# Patient Record
Sex: Male | Born: 1935 | Race: White | Hispanic: No | Marital: Married | Smoking: Former smoker
Health system: Southern US, Community
[De-identification: ages and names within clinical notes are randomized; demographics above are authoritative.]

## PROBLEM LIST (undated history)

## (undated) DIAGNOSIS — I509 Heart failure, unspecified: Secondary | ICD-10-CM

## (undated) DIAGNOSIS — I219 Acute myocardial infarction, unspecified: Secondary | ICD-10-CM

## (undated) DIAGNOSIS — Z9989 Dependence on other enabling machines and devices: Secondary | ICD-10-CM

## (undated) DIAGNOSIS — N183 Chronic kidney disease, stage 3 unspecified: Secondary | ICD-10-CM

## (undated) DIAGNOSIS — G4733 Obstructive sleep apnea (adult) (pediatric): Secondary | ICD-10-CM

## (undated) DIAGNOSIS — Z9289 Personal history of other medical treatment: Secondary | ICD-10-CM

## (undated) DIAGNOSIS — E119 Type 2 diabetes mellitus without complications: Secondary | ICD-10-CM

## (undated) DIAGNOSIS — Z95810 Presence of automatic (implantable) cardiac defibrillator: Secondary | ICD-10-CM

## (undated) DIAGNOSIS — D649 Anemia, unspecified: Secondary | ICD-10-CM

## (undated) DIAGNOSIS — J189 Pneumonia, unspecified organism: Secondary | ICD-10-CM

## (undated) DIAGNOSIS — C189 Malignant neoplasm of colon, unspecified: Secondary | ICD-10-CM

## (undated) DIAGNOSIS — I4891 Unspecified atrial fibrillation: Secondary | ICD-10-CM

## (undated) DIAGNOSIS — E7800 Pure hypercholesterolemia, unspecified: Secondary | ICD-10-CM

## (undated) DIAGNOSIS — Z8739 Personal history of other diseases of the musculoskeletal system and connective tissue: Secondary | ICD-10-CM

## (undated) HISTORY — PX: CATARACT EXTRACTION W/ INTRAOCULAR LENS  IMPLANT, BILATERAL: SHX1307

## (undated) HISTORY — PX: NASAL SINUS SURGERY: SHX719

## (undated) HISTORY — PX: INSERT / REPLACE / REMOVE PACEMAKER: SUR710

## (undated) HISTORY — PX: CARDIAC CATHETERIZATION: SHX172

## (undated) HISTORY — PX: APPENDECTOMY: SHX54

## (undated) HISTORY — PX: ATRIAL FIBRILLATION ABLATION: EP1191

## (undated) HISTORY — DX: Heart failure, unspecified: I50.9

## (undated) HISTORY — DX: Anemia, unspecified: D64.9

---

## 1984-05-30 DIAGNOSIS — I219 Acute myocardial infarction, unspecified: Secondary | ICD-10-CM

## 1984-05-30 HISTORY — DX: Acute myocardial infarction, unspecified: I21.9

## 1984-05-30 HISTORY — PX: CORONARY ARTERY BYPASS GRAFT: SHX141

## 2004-05-30 DIAGNOSIS — Z95810 Presence of automatic (implantable) cardiac defibrillator: Secondary | ICD-10-CM

## 2004-05-30 HISTORY — DX: Presence of automatic (implantable) cardiac defibrillator: Z95.810

## 2004-05-30 HISTORY — PX: CARDIAC DEFIBRILLATOR PLACEMENT: SHX171

## 2011-03-31 DIAGNOSIS — I4819 Other persistent atrial fibrillation: Secondary | ICD-10-CM | POA: Diagnosis present

## 2011-07-13 DIAGNOSIS — M216X9 Other acquired deformities of unspecified foot: Secondary | ICD-10-CM | POA: Insufficient documentation

## 2011-07-13 DIAGNOSIS — L98499 Non-pressure chronic ulcer of skin of other sites with unspecified severity: Secondary | ICD-10-CM | POA: Insufficient documentation

## 2011-08-09 DIAGNOSIS — M2040 Other hammer toe(s) (acquired), unspecified foot: Secondary | ICD-10-CM | POA: Insufficient documentation

## 2011-08-09 DIAGNOSIS — M21969 Unspecified acquired deformity of unspecified lower leg: Secondary | ICD-10-CM | POA: Insufficient documentation

## 2011-10-20 DIAGNOSIS — B351 Tinea unguium: Secondary | ICD-10-CM | POA: Insufficient documentation

## 2011-11-08 DIAGNOSIS — L988 Other specified disorders of the skin and subcutaneous tissue: Secondary | ICD-10-CM | POA: Insufficient documentation

## 2011-11-21 DIAGNOSIS — I509 Heart failure, unspecified: Secondary | ICD-10-CM | POA: Insufficient documentation

## 2011-11-21 DIAGNOSIS — I2510 Atherosclerotic heart disease of native coronary artery without angina pectoris: Secondary | ICD-10-CM | POA: Insufficient documentation

## 2011-11-28 DIAGNOSIS — E119 Type 2 diabetes mellitus without complications: Secondary | ICD-10-CM | POA: Insufficient documentation

## 2011-11-28 DIAGNOSIS — R634 Abnormal weight loss: Secondary | ICD-10-CM | POA: Insufficient documentation

## 2011-11-28 DIAGNOSIS — M79609 Pain in unspecified limb: Secondary | ICD-10-CM | POA: Insufficient documentation

## 2011-11-28 DIAGNOSIS — M79673 Pain in unspecified foot: Secondary | ICD-10-CM | POA: Insufficient documentation

## 2012-06-18 DIAGNOSIS — R609 Edema, unspecified: Secondary | ICD-10-CM | POA: Insufficient documentation

## 2012-09-17 DIAGNOSIS — E559 Vitamin D deficiency, unspecified: Secondary | ICD-10-CM | POA: Insufficient documentation

## 2012-12-03 DIAGNOSIS — G4730 Sleep apnea, unspecified: Secondary | ICD-10-CM | POA: Insufficient documentation

## 2012-12-03 DIAGNOSIS — R351 Nocturia: Secondary | ICD-10-CM | POA: Insufficient documentation

## 2012-12-03 DIAGNOSIS — I429 Cardiomyopathy, unspecified: Secondary | ICD-10-CM | POA: Insufficient documentation

## 2012-12-03 DIAGNOSIS — I428 Other cardiomyopathies: Secondary | ICD-10-CM | POA: Insufficient documentation

## 2012-12-03 DIAGNOSIS — G4733 Obstructive sleep apnea (adult) (pediatric): Secondary | ICD-10-CM | POA: Insufficient documentation

## 2012-12-03 DIAGNOSIS — I423 Endomyocardial (eosinophilic) disease: Secondary | ICD-10-CM | POA: Insufficient documentation

## 2013-05-30 DIAGNOSIS — J189 Pneumonia, unspecified organism: Secondary | ICD-10-CM

## 2013-05-30 HISTORY — DX: Pneumonia, unspecified organism: J18.9

## 2013-07-10 ENCOUNTER — Ambulatory Visit: Payer: Self-pay | Admitting: Oncology

## 2013-07-27 ENCOUNTER — Inpatient Hospital Stay: Payer: Self-pay | Admitting: Internal Medicine

## 2013-07-27 LAB — CBC
HCT: 34.8 % — ABNORMAL LOW (ref 40.0–52.0)
HGB: 11.7 g/dL — AB (ref 13.0–18.0)
MCH: 30.3 pg (ref 26.0–34.0)
MCHC: 33.6 g/dL (ref 32.0–36.0)
MCV: 90 fL (ref 80–100)
Platelet: 133 10*3/uL — ABNORMAL LOW (ref 150–440)
RBC: 3.85 10*6/uL — ABNORMAL LOW (ref 4.40–5.90)
RDW: 14.5 % (ref 11.5–14.5)
WBC: 9 10*3/uL (ref 3.8–10.6)

## 2013-07-27 LAB — COMPREHENSIVE METABOLIC PANEL
ANION GAP: 8 (ref 7–16)
AST: 23 U/L (ref 15–37)
Albumin: 3.1 g/dL — ABNORMAL LOW (ref 3.4–5.0)
Alkaline Phosphatase: 63 U/L
BUN: 41 mg/dL — ABNORMAL HIGH (ref 7–18)
Bilirubin,Total: 1.1 mg/dL — ABNORMAL HIGH (ref 0.2–1.0)
CHLORIDE: 95 mmol/L — AB (ref 98–107)
Calcium, Total: 8.6 mg/dL (ref 8.5–10.1)
Co2: 25 mmol/L (ref 21–32)
Creatinine: 1.88 mg/dL — ABNORMAL HIGH (ref 0.60–1.30)
EGFR (Non-African Amer.): 33 — ABNORMAL LOW
GFR CALC AF AMER: 39 — AB
Glucose: 105 mg/dL — ABNORMAL HIGH (ref 65–99)
Osmolality: 268 (ref 275–301)
Potassium: 4 mmol/L (ref 3.5–5.1)
SGPT (ALT): 15 U/L (ref 12–78)
SODIUM: 128 mmol/L — AB (ref 136–145)
Total Protein: 7.3 g/dL (ref 6.4–8.2)

## 2013-07-27 LAB — PROTIME-INR
INR: 2.1
PROTHROMBIN TIME: 23 s — AB (ref 11.5–14.7)

## 2013-07-27 LAB — TROPONIN I: Troponin-I: <0.02 ng/mL

## 2013-07-28 LAB — CBC WITH DIFFERENTIAL/PLATELET
BASOS ABS: 0 10*3/uL (ref 0.0–0.1)
Basophil %: 0.1 %
EOS ABS: 0.1 10*3/uL (ref 0.0–0.7)
Eosinophil %: 0.7 %
HCT: 32.2 % — AB (ref 40.0–52.0)
HGB: 11.1 g/dL — AB (ref 13.0–18.0)
LYMPHS PCT: 9.2 %
Lymphocyte #: 0.9 10*3/uL — ABNORMAL LOW (ref 1.0–3.6)
MCH: 30.9 pg (ref 26.0–34.0)
MCHC: 34.6 g/dL (ref 32.0–36.0)
MCV: 89 fL (ref 80–100)
Monocyte #: 1.4 x10 3/mm — ABNORMAL HIGH (ref 0.2–1.0)
Monocyte %: 15.2 %
Neutrophil #: 7.1 10*3/uL — ABNORMAL HIGH (ref 1.4–6.5)
Neutrophil %: 74.8 %
Platelet: 136 10*3/uL — ABNORMAL LOW (ref 150–440)
RBC: 3.6 10*6/uL — AB (ref 4.40–5.90)
RDW: 14.7 % — AB (ref 11.5–14.5)
WBC: 9.5 10*3/uL (ref 3.8–10.6)

## 2013-07-28 LAB — LIPID PANEL
CHOLESTEROL: 72 mg/dL (ref 0–200)
HDL Cholesterol: 28 mg/dL — ABNORMAL LOW (ref 40–60)
LDL CHOLESTEROL, CALC: 25 mg/dL (ref 0–100)
TRIGLYCERIDES: 93 mg/dL (ref 0–200)
VLDL Cholesterol, Calc: 19 mg/dL (ref 5–40)

## 2013-07-28 LAB — BASIC METABOLIC PANEL
ANION GAP: 6 — AB (ref 7–16)
BUN: 43 mg/dL — AB (ref 7–18)
CO2: 26 mmol/L (ref 21–32)
Calcium, Total: 8.4 mg/dL — ABNORMAL LOW (ref 8.5–10.1)
Chloride: 92 mmol/L — ABNORMAL LOW (ref 98–107)
Creatinine: 1.9 mg/dL — ABNORMAL HIGH (ref 0.60–1.30)
EGFR (African American): 38 — ABNORMAL LOW
GFR CALC NON AF AMER: 33 — AB
GLUCOSE: 46 mg/dL — AB (ref 65–99)
Osmolality: 258 (ref 275–301)
Potassium: 4 mmol/L (ref 3.5–5.1)
SODIUM: 124 mmol/L — AB (ref 136–145)

## 2013-07-28 LAB — MAGNESIUM: Magnesium: 1.8 mg/dL

## 2013-07-28 LAB — PROTIME-INR
INR: 2.7
PROTHROMBIN TIME: 28.3 s — AB (ref 11.5–14.7)

## 2013-07-28 LAB — HEMOGLOBIN A1C: HEMOGLOBIN A1C: 6.7 % — AB (ref 4.2–6.3)

## 2013-07-29 LAB — BASIC METABOLIC PANEL
ANION GAP: 7 (ref 7–16)
BUN: 42 mg/dL — ABNORMAL HIGH (ref 7–18)
Calcium, Total: 8.3 mg/dL — ABNORMAL LOW (ref 8.5–10.1)
Chloride: 86 mmol/L — ABNORMAL LOW (ref 98–107)
Co2: 26 mmol/L (ref 21–32)
Creatinine: 1.76 mg/dL — ABNORMAL HIGH (ref 0.60–1.30)
EGFR (African American): 42 — ABNORMAL LOW
EGFR (Non-African Amer.): 36 — ABNORMAL LOW
GLUCOSE: 123 mg/dL — AB (ref 65–99)
Osmolality: 252 (ref 275–301)
Potassium: 4 mmol/L (ref 3.5–5.1)
Sodium: 119 mmol/L — CL (ref 136–145)

## 2013-07-29 LAB — CBC WITH DIFFERENTIAL/PLATELET
BASOS ABS: 0 10*3/uL (ref 0.0–0.1)
BASOS PCT: 0.2 %
Eosinophil #: 0.3 10*3/uL (ref 0.0–0.7)
Eosinophil %: 3.3 %
HCT: 30 % — ABNORMAL LOW (ref 40.0–52.0)
HGB: 10.5 g/dL — ABNORMAL LOW (ref 13.0–18.0)
Lymphocyte #: 0.8 10*3/uL — ABNORMAL LOW (ref 1.0–3.6)
Lymphocyte %: 9.7 %
MCH: 31.2 pg (ref 26.0–34.0)
MCHC: 35.1 g/dL (ref 32.0–36.0)
MCV: 89 fL (ref 80–100)
MONO ABS: 1 x10 3/mm (ref 0.2–1.0)
Monocyte %: 11.9 %
NEUTROS ABS: 6 10*3/uL (ref 1.4–6.5)
Neutrophil %: 74.9 %
Platelet: 146 10*3/uL — ABNORMAL LOW (ref 150–440)
RBC: 3.38 10*6/uL — ABNORMAL LOW (ref 4.40–5.90)
RDW: 14.7 % — AB (ref 11.5–14.5)
WBC: 8 10*3/uL (ref 3.8–10.6)

## 2013-07-29 LAB — PROTIME-INR
INR: 3.5
PROTHROMBIN TIME: 33.7 s — AB (ref 11.5–14.7)

## 2013-07-30 LAB — BASIC METABOLIC PANEL
Anion Gap: 8 (ref 7–16)
BUN: 39 mg/dL — ABNORMAL HIGH (ref 7–18)
Calcium, Total: 8.2 mg/dL — ABNORMAL LOW (ref 8.5–10.1)
Chloride: 88 mmol/L — ABNORMAL LOW (ref 98–107)
Co2: 25 mmol/L (ref 21–32)
Creatinine: 1.66 mg/dL — ABNORMAL HIGH (ref 0.60–1.30)
EGFR (African American): 45 — ABNORMAL LOW
GFR CALC NON AF AMER: 39 — AB
GLUCOSE: 121 mg/dL — AB (ref 65–99)
OSMOLALITY: 255 (ref 275–301)
Potassium: 4.3 mmol/L (ref 3.5–5.1)
Sodium: 121 mmol/L — ABNORMAL LOW (ref 136–145)

## 2013-07-30 LAB — CBC WITH DIFFERENTIAL/PLATELET
BASOS PCT: 0.2 %
Basophil #: 0 10*3/uL (ref 0.0–0.1)
EOS ABS: 0.2 10*3/uL (ref 0.0–0.7)
Eosinophil %: 3.1 %
HCT: 29.8 % — ABNORMAL LOW (ref 40.0–52.0)
HGB: 10.5 g/dL — ABNORMAL LOW (ref 13.0–18.0)
LYMPHS PCT: 12.3 %
Lymphocyte #: 0.8 10*3/uL — ABNORMAL LOW (ref 1.0–3.6)
MCH: 31.3 pg (ref 26.0–34.0)
MCHC: 35.1 g/dL (ref 32.0–36.0)
MCV: 89 fL (ref 80–100)
MONOS PCT: 14.3 %
Monocyte #: 0.9 x10 3/mm (ref 0.2–1.0)
NEUTROS PCT: 70.1 %
Neutrophil #: 4.5 10*3/uL (ref 1.4–6.5)
PLATELETS: 137 10*3/uL — AB (ref 150–440)
RBC: 3.35 10*6/uL — ABNORMAL LOW (ref 4.40–5.90)
RDW: 14.9 % — ABNORMAL HIGH (ref 11.5–14.5)
WBC: 6.4 10*3/uL (ref 3.8–10.6)

## 2013-07-30 LAB — PROTIME-INR
INR: 2.8
Prothrombin Time: 28.7 secs — ABNORMAL HIGH (ref 11.5–14.7)

## 2013-07-31 ENCOUNTER — Ambulatory Visit: Payer: Self-pay | Admitting: Neurology

## 2013-07-31 LAB — CBC WITH DIFFERENTIAL/PLATELET
BASOS ABS: 0 10*3/uL (ref 0.0–0.1)
Basophil %: 0.2 %
EOS ABS: 0.2 10*3/uL (ref 0.0–0.7)
Eosinophil %: 2.8 %
HCT: 29.4 % — ABNORMAL LOW (ref 40.0–52.0)
HGB: 10.4 g/dL — AB (ref 13.0–18.0)
LYMPHS PCT: 14.8 %
Lymphocyte #: 0.9 10*3/uL — ABNORMAL LOW (ref 1.0–3.6)
MCH: 31.7 pg (ref 26.0–34.0)
MCHC: 35.4 g/dL (ref 32.0–36.0)
MCV: 89 fL (ref 80–100)
MONOS PCT: 15.3 %
Monocyte #: 0.9 x10 3/mm (ref 0.2–1.0)
Neutrophil #: 4.1 10*3/uL (ref 1.4–6.5)
Neutrophil %: 66.9 %
PLATELETS: 149 10*3/uL — AB (ref 150–440)
RBC: 3.28 10*6/uL — AB (ref 4.40–5.90)
RDW: 14.3 % (ref 11.5–14.5)
WBC: 6.1 10*3/uL (ref 3.8–10.6)

## 2013-07-31 LAB — COMPREHENSIVE METABOLIC PANEL
ALK PHOS: 86 U/L
Albumin: 2.6 g/dL — ABNORMAL LOW (ref 3.4–5.0)
Anion Gap: 7 (ref 7–16)
BILIRUBIN TOTAL: 1.2 mg/dL — AB (ref 0.2–1.0)
BUN: 37 mg/dL — AB (ref 7–18)
CALCIUM: 8.5 mg/dL (ref 8.5–10.1)
CHLORIDE: 92 mmol/L — AB (ref 98–107)
CO2: 24 mmol/L (ref 21–32)
Creatinine: 1.55 mg/dL — ABNORMAL HIGH (ref 0.60–1.30)
EGFR (African American): 49 — ABNORMAL LOW
EGFR (Non-African Amer.): 42 — ABNORMAL LOW
Glucose: 106 mg/dL — ABNORMAL HIGH (ref 65–99)
Osmolality: 257 (ref 275–301)
Potassium: 4.1 mmol/L (ref 3.5–5.1)
SGOT(AST): 20 U/L (ref 15–37)
SGPT (ALT): 27 U/L (ref 12–78)
Sodium: 123 mmol/L — ABNORMAL LOW (ref 136–145)
Total Protein: 6.6 g/dL (ref 6.4–8.2)

## 2013-07-31 LAB — PROTIME-INR
INR: 2.2
PROTHROMBIN TIME: 23.7 s — AB (ref 11.5–14.7)

## 2013-08-01 LAB — BASIC METABOLIC PANEL
ANION GAP: 7 (ref 7–16)
BUN: 28 mg/dL — ABNORMAL HIGH (ref 7–18)
Calcium, Total: 8.7 mg/dL (ref 8.5–10.1)
Chloride: 96 mmol/L — ABNORMAL LOW (ref 98–107)
Co2: 24 mmol/L (ref 21–32)
Creatinine: 1.28 mg/dL (ref 0.60–1.30)
GFR CALC NON AF AMER: 53 — AB
Glucose: 125 mg/dL — ABNORMAL HIGH (ref 65–99)
Osmolality: 262 (ref 275–301)
POTASSIUM: 3.9 mmol/L (ref 3.5–5.1)
Sodium: 127 mmol/L — ABNORMAL LOW (ref 136–145)

## 2013-08-01 LAB — PROTIME-INR
INR: 2.1
PROTHROMBIN TIME: 23.3 s — AB (ref 11.5–14.7)

## 2013-08-01 LAB — CBC WITH DIFFERENTIAL/PLATELET
BASOS PCT: 0.3 %
Basophil #: 0 10*3/uL (ref 0.0–0.1)
Eosinophil #: 0.2 10*3/uL (ref 0.0–0.7)
Eosinophil %: 2.6 %
HCT: 29.2 % — AB (ref 40.0–52.0)
HGB: 10.3 g/dL — ABNORMAL LOW (ref 13.0–18.0)
LYMPHS ABS: 0.7 10*3/uL — AB (ref 1.0–3.6)
LYMPHS PCT: 10.8 %
MCH: 31.8 pg (ref 26.0–34.0)
MCHC: 35.3 g/dL (ref 32.0–36.0)
MCV: 90 fL (ref 80–100)
Monocyte #: 1.1 x10 3/mm — ABNORMAL HIGH (ref 0.2–1.0)
Monocyte %: 16.8 %
NEUTROS PCT: 69.5 %
Neutrophil #: 4.5 10*3/uL (ref 1.4–6.5)
Platelet: 146 10*3/uL — ABNORMAL LOW (ref 150–440)
RBC: 3.25 10*6/uL — AB (ref 4.40–5.90)
RDW: 14.7 % — ABNORMAL HIGH (ref 11.5–14.5)
WBC: 6.5 10*3/uL (ref 3.8–10.6)

## 2013-08-01 LAB — EXPECTORATED SPUTUM ASSESSMENT W REFEX TO RESP CULTURE

## 2013-08-01 LAB — CULTURE, BLOOD (SINGLE)

## 2013-08-02 LAB — BASIC METABOLIC PANEL
Anion Gap: 5 — ABNORMAL LOW (ref 7–16)
BUN: 22 mg/dL — ABNORMAL HIGH (ref 7–18)
CREATININE: 1.22 mg/dL (ref 0.60–1.30)
Calcium, Total: 8.5 mg/dL (ref 8.5–10.1)
Chloride: 103 mmol/L (ref 98–107)
Co2: 26 mmol/L (ref 21–32)
EGFR (Non-African Amer.): 56 — ABNORMAL LOW
Glucose: 131 mg/dL — ABNORMAL HIGH (ref 65–99)
Osmolality: 273 (ref 275–301)
Potassium: 4 mmol/L (ref 3.5–5.1)
Sodium: 134 mmol/L — ABNORMAL LOW (ref 136–145)

## 2013-08-02 LAB — PROTIME-INR
INR: 2.2
Prothrombin Time: 23.6 secs — ABNORMAL HIGH (ref 11.5–14.7)

## 2013-08-02 LAB — CBC WITH DIFFERENTIAL/PLATELET
BASOS ABS: 0 10*3/uL (ref 0.0–0.1)
Basophil %: 0.6 %
EOS PCT: 3.6 %
Eosinophil #: 0.2 10*3/uL (ref 0.0–0.7)
HCT: 28.3 % — AB (ref 40.0–52.0)
HGB: 9.7 g/dL — ABNORMAL LOW (ref 13.0–18.0)
LYMPHS ABS: 0.7 10*3/uL — AB (ref 1.0–3.6)
Lymphocyte %: 12.8 %
MCH: 31.1 pg (ref 26.0–34.0)
MCHC: 34.3 g/dL (ref 32.0–36.0)
MCV: 91 fL (ref 80–100)
MONOS PCT: 15.3 %
Monocyte #: 0.8 x10 3/mm (ref 0.2–1.0)
NEUTROS ABS: 3.7 10*3/uL (ref 1.4–6.5)
NEUTROS PCT: 67.7 %
PLATELETS: 160 10*3/uL (ref 150–440)
RBC: 3.13 10*6/uL — ABNORMAL LOW (ref 4.40–5.90)
RDW: 14.8 % — ABNORMAL HIGH (ref 11.5–14.5)
WBC: 5.4 10*3/uL (ref 3.8–10.6)

## 2013-08-03 LAB — CBC WITH DIFFERENTIAL/PLATELET
BASOS ABS: 0 10*3/uL (ref 0.0–0.1)
BASOS PCT: 0.4 %
EOS ABS: 0.3 10*3/uL (ref 0.0–0.7)
EOS PCT: 3.4 %
HCT: 31.7 % — ABNORMAL LOW (ref 40.0–52.0)
HGB: 10.9 g/dL — ABNORMAL LOW (ref 13.0–18.0)
Lymphocyte #: 1.1 10*3/uL (ref 1.0–3.6)
Lymphocyte %: 14.3 %
MCH: 31.2 pg (ref 26.0–34.0)
MCHC: 34.3 g/dL (ref 32.0–36.0)
MCV: 91 fL (ref 80–100)
Monocyte #: 1.1 x10 3/mm — ABNORMAL HIGH (ref 0.2–1.0)
Monocyte %: 13.5 %
NEUTROS ABS: 5.4 10*3/uL (ref 1.4–6.5)
NEUTROS PCT: 68.4 %
Platelet: 241 10*3/uL (ref 150–440)
RBC: 3.48 10*6/uL — ABNORMAL LOW (ref 4.40–5.90)
RDW: 14.9 % — ABNORMAL HIGH (ref 11.5–14.5)
WBC: 7.8 10*3/uL (ref 3.8–10.6)

## 2013-08-03 LAB — BASIC METABOLIC PANEL
Anion Gap: 5 — ABNORMAL LOW (ref 7–16)
BUN: 16 mg/dL (ref 7–18)
CALCIUM: 9.2 mg/dL (ref 8.5–10.1)
CO2: 27 mmol/L (ref 21–32)
Chloride: 101 mmol/L (ref 98–107)
Creatinine: 1.13 mg/dL (ref 0.60–1.30)
EGFR (African American): >60
Glucose: 151 mg/dL — ABNORMAL HIGH (ref 65–99)
Osmolality: 270 (ref 275–301)
Potassium: 4.7 mmol/L (ref 3.5–5.1)
SODIUM: 133 mmol/L — AB (ref 136–145)

## 2013-08-03 LAB — PROTIME-INR
INR: 2.1
Prothrombin Time: 23.4 secs — ABNORMAL HIGH (ref 11.5–14.7)

## 2013-08-04 ENCOUNTER — Emergency Department: Payer: Self-pay | Admitting: Emergency Medicine

## 2013-08-04 LAB — URINALYSIS, COMPLETE
BACTERIA: NONE SEEN
Bilirubin,UR: NEGATIVE
KETONE: NEGATIVE
Leukocyte Esterase: NEGATIVE
NITRITE: NEGATIVE
Ph: 5 (ref 4.5–8.0)
Protein: NEGATIVE
RBC,UR: 16 /HPF (ref 0–5)
SPECIFIC GRAVITY: 1.012 (ref 1.003–1.030)
Squamous Epithelial: <1
WBC UR: NONE SEEN /HPF (ref 0–5)

## 2013-08-09 ENCOUNTER — Ambulatory Visit: Payer: Self-pay | Admitting: Oncology

## 2013-08-12 LAB — CBC CANCER CENTER
Basophil #: 0 x10 3/mm (ref 0.0–0.1)
Basophil %: 0.5 %
EOS PCT: 3.4 %
Eosinophil #: 0.2 x10 3/mm (ref 0.0–0.7)
HCT: 34.3 % — ABNORMAL LOW (ref 40.0–52.0)
HGB: 11.2 g/dL — ABNORMAL LOW (ref 13.0–18.0)
Lymphocyte #: 1.5 x10 3/mm (ref 1.0–3.6)
Lymphocyte %: 21.1 %
MCH: 29.8 pg (ref 26.0–34.0)
MCHC: 32.7 g/dL (ref 32.0–36.0)
MCV: 91 fL (ref 80–100)
Monocyte #: 0.8 x10 3/mm (ref 0.2–1.0)
Monocyte %: 11.2 %
Neutrophil #: 4.5 x10 3/mm (ref 1.4–6.5)
Neutrophil %: 63.8 %
PLATELETS: 288 x10 3/mm (ref 150–440)
RBC: 3.77 10*6/uL — ABNORMAL LOW (ref 4.40–5.90)
RDW: 15.2 % — ABNORMAL HIGH (ref 11.5–14.5)
WBC: 7.1 x10 3/mm (ref 3.8–10.6)

## 2013-08-12 LAB — FERRITIN: FERRITIN (ARMC): 632 ng/mL — AB (ref 8–388)

## 2013-08-12 LAB — IRON AND TIBC
IRON: 40 ug/dL — AB (ref 65–175)
Iron Bind.Cap.(Total): 237 ug/dL — ABNORMAL LOW (ref 250–450)
Iron Saturation: 17 %
Unbound Iron-Bind.Cap.: 197 ug/dL

## 2013-08-12 LAB — LACTATE DEHYDROGENASE: LDH: 159 U/L (ref 85–241)

## 2013-08-12 LAB — FOLATE: Folic Acid: 35.5 ng/mL (ref 3.1–100.0)

## 2013-08-28 ENCOUNTER — Ambulatory Visit: Payer: Self-pay | Admitting: Oncology

## 2013-11-08 ENCOUNTER — Ambulatory Visit: Payer: Self-pay | Admitting: Family Medicine

## 2013-12-13 ENCOUNTER — Ambulatory Visit: Payer: Self-pay | Admitting: Oncology

## 2013-12-13 LAB — CBC CANCER CENTER
BASOS ABS: 0 x10 3/mm (ref 0.0–0.1)
Basophil %: 0.7 %
EOS ABS: 0.7 x10 3/mm (ref 0.0–0.7)
Eosinophil %: 14.6 %
HCT: 36 % — ABNORMAL LOW (ref 40.0–52.0)
HGB: 12 g/dL — ABNORMAL LOW (ref 13.0–18.0)
LYMPHS PCT: 32.2 %
Lymphocyte #: 1.5 x10 3/mm (ref 1.0–3.6)
MCH: 30.7 pg (ref 26.0–34.0)
MCHC: 33.4 g/dL (ref 32.0–36.0)
MCV: 92 fL (ref 80–100)
MONO ABS: 0.4 x10 3/mm (ref 0.2–1.0)
Monocyte %: 8.8 %
Neutrophil #: 2 x10 3/mm (ref 1.4–6.5)
Neutrophil %: 43.7 %
PLATELETS: 164 x10 3/mm (ref 150–440)
RBC: 3.92 10*6/uL — AB (ref 4.40–5.90)
RDW: 15.1 % — AB (ref 11.5–14.5)
WBC: 4.7 x10 3/mm (ref 3.8–10.6)

## 2013-12-13 LAB — IRON AND TIBC
IRON BIND. CAP.(TOTAL): 294 ug/dL (ref 250–450)
IRON: 65 ug/dL (ref 65–175)
Iron Saturation: 22 %
UNBOUND IRON-BIND. CAP.: 229 ug/dL

## 2013-12-13 LAB — FERRITIN: FERRITIN (ARMC): 473 ng/mL — AB (ref 8–388)

## 2013-12-28 ENCOUNTER — Ambulatory Visit: Payer: Self-pay | Admitting: Oncology

## 2014-01-20 DIAGNOSIS — N62 Hypertrophy of breast: Secondary | ICD-10-CM | POA: Insufficient documentation

## 2014-01-20 DIAGNOSIS — I10 Essential (primary) hypertension: Secondary | ICD-10-CM | POA: Insufficient documentation

## 2014-04-07 DIAGNOSIS — Z4502 Encounter for adjustment and management of automatic implantable cardiac defibrillator: Secondary | ICD-10-CM | POA: Insufficient documentation

## 2014-05-01 ENCOUNTER — Ambulatory Visit: Payer: Self-pay | Admitting: Oncology

## 2014-05-01 LAB — CBC CANCER CENTER
BASOS ABS: 0 x10 3/mm (ref 0.0–0.1)
Basophil %: 0.6 %
Eosinophil #: 0.2 x10 3/mm (ref 0.0–0.7)
Eosinophil %: 5.7 %
HCT: 37.2 % — AB (ref 40.0–52.0)
HGB: 12.3 g/dL — ABNORMAL LOW (ref 13.0–18.0)
LYMPHS PCT: 37.8 %
Lymphocyte #: 1.6 x10 3/mm (ref 1.0–3.6)
MCH: 30.8 pg (ref 26.0–34.0)
MCHC: 33.1 g/dL (ref 32.0–36.0)
MCV: 93 fL (ref 80–100)
MONOS PCT: 17.7 %
Monocyte #: 0.8 x10 3/mm (ref 0.2–1.0)
NEUTROS ABS: 1.6 x10 3/mm (ref 1.4–6.5)
NEUTROS PCT: 38.2 %
PLATELETS: 133 x10 3/mm — AB (ref 150–440)
RBC: 4.01 10*6/uL — AB (ref 4.40–5.90)
RDW: 14.3 % (ref 11.5–14.5)
WBC: 4.3 x10 3/mm (ref 3.8–10.6)

## 2014-05-01 LAB — IRON AND TIBC
IRON BIND. CAP.(TOTAL): 286 ug/dL (ref 250–450)
IRON SATURATION: 17 %
IRON: 50 ug/dL — AB (ref 65–175)
UNBOUND IRON-BIND. CAP.: 236 ug/dL

## 2014-05-01 LAB — FERRITIN: FERRITIN (ARMC): 721 ng/mL — AB (ref 8–388)

## 2014-05-30 ENCOUNTER — Ambulatory Visit: Payer: Self-pay | Admitting: Oncology

## 2014-06-09 ENCOUNTER — Ambulatory Visit: Payer: Self-pay | Admitting: Podiatry

## 2014-06-15 LAB — WOUND CULTURE

## 2014-09-20 NOTE — Discharge Summary (Signed)
PATIENT NAME:  Luis Palmer, Luis Palmer MR#:  [id number] DATE OF BIRTH:  05/30/1933  DATE OF ADMISSION:  07/27/2013 DATE OF DISCHARGE:  08/03/2013  REASON FOR ADMISSION: Cough with low-grade fever.   HISTORY OF PRESENT ILLNESS: Please see the dictated HPI done by Dr. Laurin Coder on 07/27/2013.   PAST MEDICAL HISTORY: 1.  ASCVD, status post CABG.  2.  Ischemic cardiomyopathy.  3.  Chronic atrial fibrillation.  4.  Chronic systolic congestive heart failure.  5.  Status post pacemaker, defibrillator placement.  6.  Sleep apnea.  7.  Type 2 diabetes.  8.  Chronic anemia.  9.  BPH.   10.  Status post appendectomy.   MEDICATIONS ON ADMISSION: Please see admission note.   ALLERGIES: NSAIDS.   SOCIAL HISTORY, FAMILY HISTORY AND REVIEW OF SYSTEMS: As per admission note.   PHYSICAL EXAMINATION: GENERAL: The patient is in no acute distress.  VITAL SIGNS: Remarkable for a blood pressure of 104/45, with heart rate of 67, temperature of 97.7, sat of 94% on room air.  HEENT: Unremarkable.  NECK: Supple without JVD.  LUNGS: Revealed rales and rhonchi in the left middle lobe.  CARDIAC: Revealed a regular rate and rhythm with normal S1, S2.  ABDOMEN: Soft and nontender.  EXTREMITIES: Revealed  trace edema.  NEUROLOGIC: Exam was grossly nonfocal.   HOSPITAL COURSE: The patient was admitted with bilateral pneumonia with chronic systolic congestive heart failure. He was placed on IV antibiotics with oxygen and DuoNeb SVNs. Blood cultures were negative. He was seen by cardiology for his chronic heart issues which were stable. He did develop some confusion and was seen by neurology. Neurology evaluation was unremarkable. His confusion improved. His respiratory status improved. He was switched to oral antibiotics and weaned off oxygen. He did well. By 08/03/2013, the patient was stable and ready for discharge.   DISCHARGE DIAGNOSES: 1.  Community-acquired pneumonia.  2.  Chronic systolic congestive heart failure.   3.  Hyponatremia.  4.  Ischemic cardiomyopathy.  5.  Chronic atrial fibrillation.  6.  Atherosclerotic cardiovascular disease, status post coronary artery bypass graft.   DISCHARGE MEDICATIONS: 1.  Metformin 500 mg p.o. b.i.d.  2.  Spironolactone 25 mg p.o. daily.  3.  Sotalol 80 mg p.o. b.i.d.  4.  Avodart 0.5 mg p.o. daily.  5.  Coumadin 4 mg p.o. daily.  6.  Glimepiride 2 mg p.o. daily.  7.  Allegra 60 mg p.o. daily.  8.  Lipitor 20 mg p.o. daily.  9.  Zetia 10 mg p.o. daily.  10.  Guaifenesin 1200 mg p.o. b.i.d.  11.  Vitamin D 2000 units p.o. daily.  12.  Zofran 4 mg p.o. q.4 hours p.r.n. nausea and vomiting.  13.  Augmentin 875 mg p.o. b.i.d. x 10 days.  14.  Protonix 40 mg p.o. daily.  15.  Doxycycline 100 mg p.o. b.i.d. x 1 week.   FOLLOWUP PLANS AND APPOINTMENTS: The patient was discharged on a low sodium, carbohydrate-controlled diet. Follow up with me in 3 to 4 days, sooner if needed.   ____________________________ Leonie Douglas Doy Hutching, MD jds:dmm D: 08/14/2013 11:52:02 ET T: 08/14/2013 12:38:28 ET JOB#: [id number]  cc: Leonie Douglas. Doy Hutching, MD, <Dictator> Kylin Dubs Lennice Sites MD ELECTRONICALLY SIGNED 08/14/2013 13:08

## 2014-09-20 NOTE — Consult Note (Signed)
PATIENT NAME:  Luis Palmer, Luis Palmer MR#:  [id number] DATE OF BIRTH:  Nov 09, 1932  DATE OF CONSULTATION:  08/01/2013  REFERRING PHYSICIAN: Dr. Doy Hutching.   CONSULTING PHYSICIAN:  Winnifred Dufford D. Omie Ferger, MD  INDICATION: Cardiomyopathy, congestive heart failure.   HISTORY OF PRESENT ILLNESS: The patient is a 81 year old white male with a history of coronary artery disease, congestive heart failure, systolic dysfunction, permanent pacemaker, AICD placement, diabetes, sleep apnea, who states that recently in the last few days he had raspy voice, started having some significant weakness, fatigue, some coughing all day. Progressed over two days to where he had significant sputum production. The color of the sputum changed from yellow down to green and brown off and on with persistent shortness of breath with a cough and mild shortness of breath. The wife  noticed that he was weak, staggering, tachypneic with use of accessory muscles, fever of 102 with heart failure. Overall, the patient was not feeling well so he came to the Emergency Room. Was found to have bilateral multilobar pneumonia and was admitted for further evaluation and care.    REVIEW OF SYSTEMS: No blackout spells or syncope. No nausea, vomiting. Denies fever, chills, sweats. Denies weight loss, weight gain, hemoptysis, hematemesis. No bright red blood per rectum. He had cough, sputum production, weakness, fever.  PAST MEDICAL HISTORY:  Atrial fibrillation, diabetes, coronary artery disease, sleep apnea, sick sinus syndrome, congestive heart failure, cardiomyopathy, anemia, BPH.   ALLERGIES: NSAIDs.   PAST SURGICAL HISTORY: Coronary artery bypass surgery, permanent pacemaker placement,   AICD placement, pulmonary vein ablation for atrial fibrillation, appendectomy.   FAMILY HISTORY: Myocardial infarction, cancer, diabetes.   SOCIAL HISTORY: Married. Used to smoke. Lives with his wife. Retired Pharmacist, hospital.   MEDICATIONS: Metformin 500 twice a  day, sotalol 80 twice daily, Coreg 12.5 twice a day, Lasix 40 a day, Lipitor 20 a day, Avodart 0.5 daily, Allegra 60 a day, losartan 50 a day, Coumadin 5 mg daily, Zetia 10 mg daily, glimepiride 4 mg daily, spironolactone 25 daily, vitamin D3,  saw palmetto.   PHYSICAL EXAMINATION: VITAL SIGNS: Blood pressure 110/70, pulse 70, respiratory rate of 16, temperature 102  HEENT: Normocephalic, atraumatic. Pupils equal and reactive to light.  NECK: Supple. No significant JVD, bruits, or adenopathy.  LUNGS: Bilateral rhonchi. Bilateral crackles. Decreased breath sounds. No rales.  HEART: Irregularly irregular. Systolic ejection murmur along the left sternal border. Positive S3. PMI nondisplaced.  ABDOMEN: Benign.  EXTREMITY: WNL NEUROLOGIC: Intact.  SKIN: Normal.   LABORATORY: Glucose 105, BUN 41, creatinine 0.88, sodium 128, bilirubin 1.1. White count of 9, hemoglobin 11.7, platelet count 133, INR of 2.1.   ASSESSMENT: Pneumonia, respiratory failure, congestive heart failure, cardiomyopathy, coronary artery disease, chronic renal insufficiency. Relative hypotension, sleep apnea, hyponatremia, anemia, thrombocytopenia, coagulopathy.   PLAN: Agree with admit. Rule out for myocardial infarction. Continue antibiotics for pneumonia. Continue current therapy. Continue inhalers as necessary. Continue with fluid resuscitation for possible dehydration. Continue atrial fibrillation rate control. Continue sotalol for treatment of atrial fibrillation. Continue diabetes management. Follow-up fasting sugars. Continue treatment for heart failure. Continue diuretics therapy. Continue Coreg.    Also recommend continue therapy for hypotension. Continue CPAP for obstructive sleep apnea, continue treatment for hyponatremia. Follow up thrombocytopenia. Agree with continued Coumadin for anticoagulation. We will discuss the case with his primary cardiologist, .   at [facility] and maintain current therapy as necessary. This appears  to be mostly pulmonary, so no aggressive therapy for cardiac treatment is necessary at this point.  ____________________________  Godson Pollan D. Clayborn Bigness, MD ddc:sg D: 08/01/2013 14:11:11 ET T: 08/01/2013 18:41:50 ET JOB#: [id number]  cc: Jeran Hiltz D. Clayborn Bigness, MD, <Dictator> Yolonda Kida MD ELECTRONICALLY SIGNED 09/04/2013 15:14

## 2014-09-20 NOTE — Consult Note (Signed)
PATIENT NAME:  Luis Palmer, CHUNG MR#:  [id number] DATE OF BIRTH:  1933-04-20  DATE OF CONSULTATION:  07/31/2013  CONSULTING PHYSICIAN:  Leotis Pain, MD  REASON FOR CONSULTATION: Altered mental status.  HISTORY OF PRESENTING ILLNESS: Which is obtained from chart, the patient, and the patient's wife who is at bedside. This is a pleasant 81 year old gentleman with past medical history of coronary artery disease, CHF that is systolic in nature, permanent pacemaker and AICD. The patient also has past medical history of diabetes, sleep apnea, and is on home CPAP. The patient presented to [facility] on 07/27/2013 with 2-day history of productive cough, found to have a pneumonia, started on antibiotics. At that time, the patient had a fever of 102, chest x-ray showing bilateral infiltrates, as well as chest x-ray today showing some bilateral edema. Throughout the past 2 days, the patient's mental status slowly has been  deteriorating, more so in the past day. The patient is status post receiving Tussionex suspension, which has hydrocodone in it, yesterday and I suspect the mental status change was partially due to that. As per wife, mental status significantly improved today. The patient is not at baseline but much better.   REVIEW OF SYSTEMS:  CONSTITUTIONAL: Positive for generalized fatigue.  EYES: No blurred vision. No double vision.  EAR, NOSE, THROAT: No tinnitus. No ear pain.  RESPIRATORY: Positive productive cough and history of COPD.  CARDIOVASCULAR: History of pacemaker.  GASTROINTESTINAL: No nausea. No vomiting.  ENDOCRINE: No polyuria or polydipsia.  SKIN: No rashes.  NEUROLOGICAL: No weakness on one side of the body compared to the other and confusion.   PAST MEDICAL HISTORY: Including atrial fibrillation, diabetes, coronary artery disease, sleep apnea, pacemaker, AICD, CHF, anemia, and BPH.  ALLERGIES: INCLUDE NSAIDS.   PAST SURGICAL HISTORY: CABG, permanent  pacemaker, AICD, pulmonary vein ablation for atrial fibrillation, appendectomy as a child.   FAMILY HISTORY: Father died of an MI.   SOCIAL HISTORY: Currently does not smoke, but smoked for 50 years and quit in 1986. No alcohol. No drug use. Lives with his wife. Just recently moved into a new house about 2 months ago.   LABORATORY DATA: Pertinent lab work-up reveals a sodium that is titrating up, 123, up  from 121. He was at 129 on admission. INR is 2.2. White blood cell count is trending down, 6.1, hemoglobin 10.4, hematocrit 29.4. BUN 37, creatinine 1.55. LFTs within normal limits. His sputum cultures from 02/28 were positive for gram-negative rods. He is currently on Zosyn   PHYSICAL EXAMINATION: VITAL SIGNS: Include a temperature of 97.4, pulse 65, respirations 16, blood pressure 106/65, pulse oximetry 93% on room air, as the patient has history of COPD. GENERAL: The patient is alert, awake, oriented to tell me where he currently. He was actually able to tell me the floor, able to tell me how many quarters are in $1.50. Was oriented to month, year, but could not tell me the exact date and day. Was able to tell me his right date of birth. NEUROLOGIC: Cranial nerve examination: Extraocular movements are intact. Pupils 3 mm to 2 mm, reactive symmetrically. Facial sensation intact. Facial motor is intact. Tongue is midline. Uvula elevates symmetrically. Shoulder shrug is intact. Motor strength appears to be 5 out of 5 bilateral upper and lower extremities. Sensation intact to light touch. Coordination: Finger-to-nose intact. Gait: Not assessed. Reflexes: 1+ diminished.   IMPRESSION: This is a 81 year old gentleman with history of coronary artery disease, congestive heart failure, atrial  fibrillation, pacemaker, diabetes, sleep apnea, presenting with pneumonia. Currently being treated for pneumonia, with worsening mental status for the past 2 days, The patient is status post receiving Tussionex earlier,  which contains hydrocodone. I believe his symptoms have significantly improved, as per his wife and his nursing staff.   PLAN: Continue aggressive hydration. His Tussionex has been discontinued, which I believe could have contributed to his altered mental status. Hyponatremia improving, currently 123, up from 121 but down from his baseline on admission. Continue salt supplementation with diet. At this point, I suspect his mental status will continue to improve. Do not think he needs any further imaging such as an EEG or CAT scan. Please call with any questions.   Thank you, it was a pleasure seeing this patient.   ____________________________ Leotis Pain, MD yz:jcm D: 07/31/2013 13:32:17 ET T: 07/31/2013 13:44:29 ET JOB#: [id number]  cc: Leotis Pain, MD, <Dictator> Leotis Pain MD ELECTRONICALLY SIGNED 08/17/2013 13:33

## 2014-09-20 NOTE — H&P (Signed)
PATIENT NAME:  LILLIAN, BENNE MR#:  [id number] DATE OF BIRTH:  27-Jun-1932  DATE OF ADMISSION:  07/27/2013  REFERRING PHYSICIAN: Lavonia Drafts, MD.   PRIMARY CARE PHYSICIAN: Dr. Doy Hutching.   CHIEF COMPLAINT: Cough and low-grade fever.   HISTORY OF PRESENT ILLNESS: This is a very nice 81 year old gentleman who has history of coronary artery disease, CHF with systolic dysfunction who has a permanent pacemaker and an AICD placement. He has diabetes, sleep apnea and he has a CPAP.  The patient comes today with a history of Tuesday starting with a raspy voice that is starting to get more significant as the day went by. The patient had cough starting that day and progressed to the point that on Thursday was having significant sputum. The color of the sputum has changed from yellow down to green and brown on and off and has become very prominent. He has had significant problems sleeping because of the cough. He denies any shortness of breath. His wife noticed that he is really weak and staggering when he walks, but has not noticed any significant difficulty breathing or use of accessory muscles. He had a fever of 102 yesterday. He denies any edema or signs of CHF exacerbation.  Overall, the patient was not feeling really good at home. He was evaluated here at the Emergency Department where he was found to be afebrile but with a chest x-ray showing bilateral or multilobar pneumonia, for which he was admitted for treatment with IV antibiotics.  REVIEW OF SYSTEMS: A 12-system review. CONSTITUTIONAL: Positive fever. Positive fatigue and weakness. No significant weight loss or weight gain.  EYES: No blurry vision, double vision or difficulty swallowing.  EARS, NOSE, THROAT: No tinnitus, ear pain or snoring.  RESPIRATORY: No wheezing. Positive cough. Positive sputum as described above. No COPD. No painful respirations.  CARDIOVASCULAR: No chest pain. No orthopnea. No edema right now, but he states that he  occasionally has peripheral edema if he stands up for a long time. No syncope.  GASTROINTESTINAL: No nausea, vomiting, abdominal pain, constipation, diarrhea or jaundice.  GENITOURINARY: No dysuria, hematuria, changes in frequency.  ENDOCRINE: No polyuria, polydipsia or polyphagia. HEMATOLOGIC AND LYMPHATIC:  No anemia, easy bruising or bleeding.  SKIN: No rashes or petechiae.  MUSCULOSKELETAL: No significant neck pain, back pain, shoulder pain.  NEUROLOGIC: No numbness, weakness or CVAs.  PSYCHIATRIC: No insomnia or depression.   PAST MEDICAL HISTORY: 1.  Atrial fibrillation.  2.  Diabetes.  3.  Coronary artery disease, status post MI in '86, followed by CABG x 3. 4.  Sleep apnea.  5.  Permanent pacemaker.  6.  AICD placement. 7.  CHF systolic dysfunction with apparently an ejection fraction below 20 at some point before the AICD.  8.  Anemia of chronic disease with iron deficiency for which the patient has iron shots a couple times a year.  9.  BPH.  ALLERGIES: NSAIDS.   PAST SURGICAL HISTORY: 1.  CABG x 3 in 1986.  2.  Permanent pacemaker placement.  3.  AICD placement.  4.  Pulmonary vein ablation for atrial fibrillation x 2.  5.  Appendectomy as a child.   FAMILY HISTORY: MI in his father at the age of 61s. Cancer is negative in his family. Diabetes negative in his family.   SOCIAL HISTORY: The patient used to smoke. He smoked for 50 years, but he quit in 1986, whenever he had his first heart attack. He does not drink. He lives with his wife. He  is retired from Database administrator.   CURRENT MEDICATIONS: Metformin 500 mg twice daily, sotalol 80 mg twice daily, Coreg 12.5 mg twice daily, furosemide 40 mg once daily, atorvastatin 20 mg once daily, Avodart 0.5 mg daily, Allegra 60 mg daily, losartan 50 mg daily, Coumadin 5 mg daily, Zetia 10 mg daily, glimepiride 4 mg daily, spironolactone 25 mg daily, vitamin D3 2,000 units daily, saw palmetto once daily.   PHYSICAL  EXAMINATION: VITAL SIGNS: Blood pressure ranging in between 95 systolic to [id number] systolic over 45 to 53 range on diastolic pressures. Pulse is 67, temperature 97.7, respirations in between 18 to 20, oxygen saturation 94% to 100% on room air. GENERAL: The patient is alert, oriented x 3, in no acute distress at this moment. No respiratory distress. Hemodynamically stable.  HEENT: His pupils are equal, round and reactive. Extraocular movements are intact. Mucosa slightly dry. Anicteric sclerae. Pink conjunctivae. No oral lesions. No oropharyngeal exudates.  NECK: Supple. No JVD. No thyromegaly. No adenopathy. No carotid bruits. No rigidity.  CARDIOVASCULAR: Regular rate and rhythm. No murmurs, rubs or gallops are appreciated. No displacement of PMI.  LUNGS: Positive rales bilaterally with 2 large sounds at the level of the left middle lobe. No dullness to percussion. No use of accessory muscles at this moment.  ABDOMEN: Soft, nontender, nondistended. No hepatosplenomegaly. No masses. Bowel sounds are positive.  GENITAL: Deferred.  EXTREMITIES: No cyanosis or clubbing. Trace edema at the level of the ankles, minimal. The patient also has changes of coloration of the skin related to chronic vascular insufficiency at the level of the pretibial areas. No rashes or petechiae. No signs of cellulitis at this moment.  MUSCULOSKELETAL: No joint effusions or joint swelling.  NEUROLOGIC: Cranial nerves II through XII intact. Strength is 5/5 in 4 extremities.  PSYCHIATRIC: The patient is alert, oriented and there is no significant agitation.  LYMPHATIC: Negative for lymphadenopathy in neck or supraclavicular areas.  RESULTS: Glucose is 105. BUN 41, creatinine 0.88. We do not have a baseline, but apparently the patient has normal kidney function as per the wife. Sodium is 128, total protein 7.3. Bilirubin is 1.1. White count is 9. Hemoglobin is 11.7, platelet count 133. INR is 2.1.   ASSESSMENT AND PLAN: A  81 year old gentleman with history of congestive heart failure, atrial fibrillation, diabetes, sleep apnea, permanent pacemaker, automatic implantable cardiac defibrillator, iron deficiency anemia, benign prostatic hypertrophy comes with increased cough and low-grade fever at home. Afebrile here.  1.  Pneumonia. The patient has community-acquired multilobar pneumonia. At this moment, he is going to be cultured. Blood cultures have been sent. Sputum culture is ordered. Check Streptococcus and Legionella antigens. The patient is going to be put on antibiotics with levofloxacin for now, change to renal dose. The patient looks stable right now, but if there is any worsening, consider broadening up the antibiotics to add on Zosyn. For now, we are just going to do levofloxacin. The patient has not been recently hospitalized and there are no other risk factors for multiresistant bacteria, although he does have bilateral pneumonia. The patient does not look to be immunosuppressed and there are no signs of sepsis at this moment on his vitals or lab work. The patient is going to be on pulmonary toilet with nebulizers. Follow labs in the morning.  2.  Congestive heart failure, systolic. Apparently his ejection fraction has been below 20 for a while. The patient does not have any signs of fluid overload at this moment. We are going to give  him some IV fluids because he is in acute kidney injury and he looks a little bit dehydrated due to his pneumonia. I am going to do only small amounts, 500 mL for now during the next several hours and monitor his blood work. No signs of exacerbation. No signs of pulmonary edema. Get records from [place] to get an echocardiogram report.  3.  Acute kidney failure. The patient apparently has normal renal failure. Because of this, we are going to hold some of his medications starting from the Lasix since the patient is not fluid overload. Hold spironolactone. Hold metformin since his creatinine  is above 1.5 and hold losartan. The rest of his medications, we are going to continue. Give a small amount of IV fluids and monitor tomorrow. This is likely secondary to his pneumonia and intravascular volume depletion.  4.  Relative hypotension. The patient usually has low blood pressures due to the amount of blood pressure medications that he takes. For now, we are going to just hold all those medications and give him some IV fluids, monitor blood pressure closely.  5.  Sleep apnea. Continue CPAP. We are going to try to do some control of his cough.  6.  Hyponatremia. This could be the result of intravascular volume depletion for which some IV fluids are going to be given to the patient. Give more IV fluids necessary, as far as the patient is not clinically overloaded.  7.  Anemia. Iron deficiency. His hemoglobin is 11.7 and is stable. No need for interventions.  8.  Thrombocytopenia. Platelets of 133. This is likely secondary to consumption due to pneumonia. Monitor closely.  9.  Anticoagulation with Coumadin. His INR is 2.1. Continue to monitor PT/INR. 10.  Deep vein thrombosis prophylaxis has been given with Coumadin.    11.  Gastrointestinal prophylaxis with Protonix. 12.  Other medical problems are stable.  The patient is a full code.   I spent about 50 minutes with this admission.   The patient is going to be transferred to Dr. Doy Hutching' service.    ____________________________ Colchester Sink, MD rsg:ce D: 07/27/2013 13:56:05 ET T: 07/27/2013 14:21:38 ET JOB#: [id number]  cc: Chokio Sink, MD, <Dictator> Shantrell Placzek America Brown MD ELECTRONICALLY SIGNED 07/30/2013 13:15

## 2014-12-29 ENCOUNTER — Emergency Department
Admission: EM | Admit: 2014-12-29 | Discharge: 2014-12-29 | Disposition: A | Discharge status: Home / Self Care | Payer: Medicare Other | Attending: Emergency Medicine | Admitting: Emergency Medicine

## 2014-12-29 ENCOUNTER — Emergency Department: Payer: Medicare Other

## 2014-12-29 ENCOUNTER — Encounter: Payer: Self-pay | Admitting: Emergency Medicine

## 2014-12-29 DIAGNOSIS — Z7982 Long term (current) use of aspirin: Secondary | ICD-10-CM | POA: Diagnosis not present

## 2014-12-29 DIAGNOSIS — R079 Chest pain, unspecified: Secondary | ICD-10-CM | POA: Insufficient documentation

## 2014-12-29 DIAGNOSIS — Z79899 Other long term (current) drug therapy: Secondary | ICD-10-CM | POA: Diagnosis not present

## 2014-12-29 DIAGNOSIS — M79631 Pain in right forearm: Secondary | ICD-10-CM | POA: Insufficient documentation

## 2014-12-29 DIAGNOSIS — Z7901 Long term (current) use of anticoagulants: Secondary | ICD-10-CM | POA: Insufficient documentation

## 2014-12-29 DIAGNOSIS — M25531 Pain in right wrist: Secondary | ICD-10-CM

## 2014-12-29 LAB — CBC
HCT: 38.8 % — ABNORMAL LOW (ref 40.0–52.0)
Hemoglobin: 13.1 g/dL (ref 13.0–18.0)
MCH: 30.9 pg (ref 26.0–34.0)
MCHC: 33.8 g/dL (ref 32.0–36.0)
MCV: 91.6 fL (ref 80.0–100.0)
PLATELETS: 140 10*3/uL — AB (ref 150–440)
RBC: 4.24 MIL/uL — ABNORMAL LOW (ref 4.40–5.90)
RDW: 14.5 % (ref 11.5–14.5)
WBC: 5.5 10*3/uL (ref 3.8–10.6)

## 2014-12-29 LAB — BASIC METABOLIC PANEL
Anion gap: 8 (ref 5–15)
BUN: 30 mg/dL — AB (ref 6–20)
CALCIUM: 9.9 mg/dL (ref 8.9–10.3)
CHLORIDE: 102 mmol/L (ref 101–111)
CO2: 29 mmol/L (ref 22–32)
Creatinine, Ser: 1.32 mg/dL — ABNORMAL HIGH (ref 0.61–1.24)
GFR calc Af Amer: 57 mL/min — ABNORMAL LOW (ref >60)
GFR calc non Af Amer: 49 mL/min — ABNORMAL LOW (ref >60)
Glucose, Bld: 115 mg/dL — ABNORMAL HIGH (ref 65–99)
POTASSIUM: 5.1 mmol/L (ref 3.5–5.1)
SODIUM: 139 mmol/L (ref 135–145)

## 2014-12-29 LAB — TROPONIN I: Troponin I: <0.03 ng/mL (ref <0.031)

## 2014-12-29 LAB — PROTIME-INR
INR: 3.08
PROTHROMBIN TIME: 31.8 s — AB (ref 11.4–15.0)

## 2014-12-29 MED ORDER — ASPIRIN 81 MG PO CHEW
81.0000 mg | CHEWABLE_TABLET | Freq: Once | ORAL | Status: AC
  Administered 2014-12-29: 81 mg via ORAL
  Filled 2014-12-29: qty 1

## 2014-12-29 NOTE — ED Notes (Signed)
Yesterday developed right arm pain at intervals. Had 2 episodes today, none now, has done some lifting

## 2014-12-29 NOTE — ED Notes (Signed)
Troponin II collected and sent to lab for processing.

## 2014-12-29 NOTE — Discharge Instructions (Signed)
Follow-up with cardiology at 9 AM tomorrow morning. You have an appointment at this time with Dr. Chancy Milroy.  You have been seen in the Emergency Department (ED) today for chest pain.  As we have discussed todays test results are normal, but you may require further testing.  Please follow up with the recommended doctor as instructed above in these documents regarding todays emergent visit and your recent symptoms to discuss further management.  Continue to take your regular medications. If you are not doing so already, please also take a daily baby aspirin (81 mg), at least until you follow up with your doctor.  Return to the Emergency Department (ED) if you experience any further chest pain/pressure/tightness, difficulty breathing, or sudden sweating, or other symptoms that concern you.

## 2014-12-29 NOTE — ED Notes (Signed)
Pt presents with chest pain started yesterday afternoon, went away and returned this am, but no pain at this time. Pt does have pacemaker and with hx of triple bypass.

## 2014-12-29 NOTE — ED Provider Notes (Signed)
[facility] Emergency Department Provider Note  ____________________________________________  Time seen: Approximately 1:35 PM  I have reviewed the triage vital signs and the nursing notes.   HISTORY  Chief Complaint Right forearm pain   HPI Luis Palmer is a 81 y.o. male history of coronary disease, bypass 30 years ago, 2 resents today with intermittent pain in his right forearm. This is gone away, but he did have brief episode of forearm pain earlier this morning, he also had several episodes last night, for which she took 6 full-strength adult aspirin's which helped. He also has atrial fibrillation and is on Coumadin.  Pain in the right forearm comes and goes and is dull. Does not seem to be exertional.  No nausea vomiting. He specifically denies ever having any pain in his chest, but he states that his last heart attack 30 years ago he had no chest pain and the only pain he experienced with a similar pain in his right forearm. No trouble breathing. No abdominal pain. He does relate moving a fertilizer bag at [employer] yesterday, and is unclear if this is anything to do with the performed pain. Does not know of any specific injury. No numbness or tingling except for chronic tingling in the tips of all fingers.  No cold or blue hand.  Past Medical History  Diagnosis Date  . Hx of CABG     There are no active problems to display for this patient.   Past Surgical History  Procedure Laterality Date  . Cardiac surgery      Current Outpatient Rx  Name  Route  Sig  Dispense  Refill  . aspirin EC 325 MG tablet   Oral   Take 975 mg by mouth daily as needed (for arm pain).         Marland Kitchen atorvastatin (LIPITOR) 20 MG tablet   Oral   Take 20 mg by mouth daily.         . carvedilol (COREG) 12.5 MG tablet   Oral   Take 12.5 mg by mouth 2 (two) times daily.         . Cholecalciferol (VITAMIN D) 2000 UNITS CAPS   Oral   Take 1 capsule by mouth daily.          Marland Kitchen ezetimibe (ZETIA) 10 MG tablet   Oral   Take 10 mg by mouth at bedtime.         . furosemide (LASIX) 40 MG tablet   Oral   Take 40 mg by mouth daily.         Marland Kitchen glimepiride (AMARYL) 4 MG tablet   Oral   Take 4 mg by mouth daily with breakfast.         . losartan (COZAAR) 50 MG tablet   Oral   Take 50 mg by mouth at bedtime.          . metFORMIN (GLUCOPHAGE) 500 MG tablet   Oral   Take 500 mg by mouth 2 (two) times daily.         . sotalol (BETAPACE) 80 MG tablet   Oral   Take 80 mg by mouth 2 (two) times daily.         Marland Kitchen spironolactone (ALDACTONE) 25 MG tablet   Oral   Take 25 mg by mouth daily.         Marland Kitchen warfarin (COUMADIN) 5 MG tablet   Oral   Take 5 mg by mouth every evening.  Allergies Nsaids; Sudafed; and Vasotec  No family history on file.  Social History History  Substance Use Topics  . Smoking status: Never Smoker   . Smokeless tobacco: Not on file  . Alcohol Use: No    Review of Systems Constitutional: No fever/chills Eyes: No visual changes. ENT: No sore throat. Cardiovascular: Denies chest pain. Respiratory: Denies shortness of breath. Gastrointestinal: No abdominal pain.  No nausea, no vomiting.  No diarrhea.  No constipation. Genitourinary: Negative for dysuria. Musculoskeletal: Negative for back pain. He history of present illness Skin: Negative for rash. Neurological: Negative for headaches, focal weakness or numbness.  10-point ROS otherwise negative.  ____________________________________________   PHYSICAL EXAM:  VITAL SIGNS: ED Triage Vitals  Enc Vitals Group     BP 12/29/14 1158 113/55 mmHg     Pulse Rate 12/29/14 1158 59     Resp 12/29/14 1158 18     Temp 12/29/14 1158 98.6 F (37 C)     Temp Source 12/29/14 1158 Oral     SpO2 12/29/14 1158 99 %     Weight 12/29/14 1158 240 lb (108.863 kg)     Height 12/29/14 1158 6' (1.829 m)     Head Cir --      Peak Flow --      Pain Score --       Pain Loc --      Pain Edu? --      Excl. in [place]? --     Constitutional: Alert and oriented. Well appearing and in no acute distress. Eyes: Conjunctivae are normal. PERRL. EOMI. Head: Atraumatic. Nose: No congestion/rhinnorhea. Mouth/Throat: Mucous membranes are moist.  Oropharynx non-erythematous. Neck: No stridor.   Cardiovascular: Normal rate, regular rhythm. Grossly normal heart sounds.  Good peripheral circulation. Respiratory: Normal respiratory effort.  No retractions. Lungs CTAB. Gastrointestinal: Soft and nontender. No distention. No abdominal bruits. No CVA tenderness. Musculoskeletal: No lower extremity tenderness nor edema.  No joint effusions. Median ulnar and radial nerves intact in the right upper extremity. Strong radial pulses bilaterally. Normal capillary refill. Normal motor use of the right forearm. No tenderness to palpation or evidence of injury to the right forearm. Neurologic:  Normal speech and language. No gross focal neurologic deficits are appreciated. Skin:  Skin is warm, dry and intact. No rash noted. Psychiatric: Mood and affect are normal. Speech and behavior are normal.  ____________________________________________   LABS (all labs ordered are listed, but only abnormal results are displayed)  Labs Reviewed  BASIC METABOLIC PANEL - Abnormal; Notable for the following:    Glucose, Bld 115 (*)    BUN 30 (*)    Creatinine, Ser 1.32 (*)    GFR calc non Af Amer 49 (*)    GFR calc Af Amer 57 (*)    All other components within normal limits  CBC - Abnormal; Notable for the following:    RBC 4.24 (*)    HCT 38.8 (*)    Platelets 140 (*)    All other components within normal limits  PROTIME-INR - Abnormal; Notable for the following:    Prothrombin Time 31.8 (*)    All other components within normal limits  TROPONIN I  TROPONIN I   ____________________________________________  EKG  Reviewed and interpreted by me I agree with the  computer. Electronic ventricular paced rhythm at 60 bpm QRS 156 QTC 500  Electronic pacing, no Scarboas criteria. Though there is no obvious ischemia on this ventricular rhythm, we cannot rule this out based on  ECG. ____________________________________________  RADIOLOGY  DG Chest 2 View (Final result) Result time: 12/29/14 12:42:21   Final result by Rad Results In Interface (12/29/14 12:42:21)   Narrative:   CLINICAL DATA: Right upper extremity region pain  EXAM: CHEST 2 VIEW  COMPARISON: August 02, 2013  FINDINGS: There is scarring in the left lower lobe region. There is no edema or consolidation. The heart is upper normal in size with pulmonary vascular within normal limits. Defibrillator lead tips are attached to the right atrium, right ventricle, and left ventricle. No adenopathy. No pneumothorax. No bone lesions.  IMPRESSION: Mild scarring left lower lobe. No edema or consolidation.    ____________________________________________   PROCEDURES  Procedure(s) performed: None  Critical Care performed: No  ____________________________________________   INITIAL IMPRESSION / ASSESSMENT AND PLAN / ED COURSE  Pertinent labs & imaging results that were available during my care of the patient were reviewed by me and considered in my medical decision making (see chart for details).  Intermittent right forearm pain. No other associated symptoms, he does relate that his previous heart attack he had severe forearm pain with nausea and severe sweats, none of which is occurring with today's episode. He is awake alert, stable vital signs.  His exam, and a symptom medics status in the ER very reassuring. His first troponin is negative, I have paged the on-call for [facility] cardiology to discuss patient's symptoms and obtain recommendations. Dr. Stann Mainland at [facility], but he recommends the patient have stress testing done and it would be reasonable to admit him for rule out. I discussed  with the patient, he states that he is not willing to stay in the hospital. At a minimum, I discussed the risks of leaving and we will obtain 2 troponins. He is asymptomatic now, and I spoke with Dr. Chancy Milroy our local cardiologists and he will see the patient and 9 AM tomorrow. Patient agreeable. Should the patient develop any return of forearm pain, nausea, trouble breathing or other new concerns or return right back to the ER right away and his wife will be with him.  I offered and advised admission, patient does not wish for this and does understand the risks of leaving and that he could be potentially having a "heart attack", but I think that it is also not unreasonable for him to go home with good return precautions and follow-up with cardiology at 9 AM.   ____________________________________________   FINAL CLINICAL IMPRESSION(S) / ED DIAGNOSES  Final diagnoses:  Forearm joint pain, right      Delman Kitten, MD 12/29/14 1756

## 2014-12-29 NOTE — Progress Notes (Signed)
   12/29/14 1500  Clinical Encounter Type  Visited With Patient and family together  Visit Type Spiritual support  Spiritual Encounters  Spiritual Needs Prayer  Stress Factors  Patient Stress Factors None identified  Family Stress Factors None identified   Status: 20yr male Faith: [employer] Family: wife bedside Visit Assessment: The patient shared that he has been married for 3 yrs. His wife says his blue eyes attracted her. The patient shared that he was feeling no pain just here for precautions. The chaplain provided a listening ear.  Pastoral Care: [phone] pager Or submit an order online  P

## 2015-01-20 DIAGNOSIS — Z7901 Long term (current) use of anticoagulants: Secondary | ICD-10-CM | POA: Insufficient documentation

## 2015-01-22 DIAGNOSIS — Z950 Presence of cardiac pacemaker: Secondary | ICD-10-CM | POA: Insufficient documentation

## 2015-01-22 DIAGNOSIS — I498 Other specified cardiac arrhythmias: Secondary | ICD-10-CM | POA: Insufficient documentation

## 2017-10-02 ENCOUNTER — Other Ambulatory Visit: Payer: Self-pay

## 2017-10-02 ENCOUNTER — Encounter: Payer: Self-pay | Admitting: Oncology

## 2017-10-02 ENCOUNTER — Inpatient Hospital Stay: Payer: Medicare Other | Attending: Oncology | Admitting: Oncology

## 2017-10-02 ENCOUNTER — Inpatient Hospital Stay: Payer: Medicare Other

## 2017-10-02 VITALS — BP 110/69 | HR 60

## 2017-10-02 DIAGNOSIS — Z79899 Other long term (current) drug therapy: Secondary | ICD-10-CM | POA: Diagnosis not present

## 2017-10-02 DIAGNOSIS — D509 Iron deficiency anemia, unspecified: Secondary | ICD-10-CM

## 2017-10-02 MED ORDER — SODIUM CHLORIDE 0.9 % IV SOLN
510.0000 mg | Freq: Once | INTRAVENOUS | Status: AC
  Administered 2017-10-02: 510 mg via INTRAVENOUS
  Filled 2017-10-02: qty 17

## 2017-10-02 MED ORDER — SODIUM CHLORIDE 0.9 % IV SOLN
Freq: Once | INTRAVENOUS | Status: AC
  Administered 2017-10-02: 15:00:00 via INTRAVENOUS
  Filled 2017-10-02: qty 1000

## 2017-10-02 NOTE — Progress Notes (Signed)
Patient here today as a new patient  

## 2017-10-02 NOTE — Progress Notes (Signed)
Luis Palmer  [phone] Fax:(336) [phone]  ID: CHAEL Palmer OB: September 24, 1932  MR#: [id number]  UUV#:[id number]  Patient Care Team: Patient, No Pcp Per as PCP - General (General Practice)  CHIEF COMPLAINT: Iron deficiency anemia.  INTERVAL HISTORY: Patient is an 84 year old male who was noted to have persistently decreased hemoglobin and iron stores.  No obvious etiology can be determined.  He complains of chronic weakness and fatigue, but otherwise feels well.  He has no neurologic complaints.  He denies any recent fevers or illnesses.  He has a good appetite and denies weight loss.  He has no chest pain or shortness of breath.  He denies any nausea, vomiting, constipation, or diarrhea.  He has no melena or hematochezia.  He has no urinary complaints.  Patient feels at his baseline offers no further specific complaints today.  REVIEW OF SYSTEMS:   Review of Systems  Constitutional: Positive for malaise/fatigue. Negative for fever and weight loss.  Respiratory: Negative.  Negative for cough, hemoptysis and shortness of breath.   Cardiovascular: Negative.  Negative for chest pain and leg swelling.  Gastrointestinal: Negative.  Negative for abdominal pain, blood in stool and melena.  Genitourinary: Negative.  Negative for hematuria.  Musculoskeletal: Negative.   Skin: Negative.  Negative for rash.  Neurological: Negative.  Negative for sensory change, focal weakness and weakness.  Psychiatric/Behavioral: Negative.  The patient is not nervous/anxious.     As per HPI. Otherwise, a complete review of systems is negative.  PAST MEDICAL HISTORY: Past Medical History:  Diagnosis Date  . Anemia   . CHF (congestive heart failure) ([place])   . Diabetes mellitus without complication ([place])   . Hx of CABG     PAST SURGICAL HISTORY: Past Surgical History:  Procedure Laterality Date  . APPENDECTOMY    . CARDIAC SURGERY      FAMILY HISTORY: Family History    Problem Relation Age of Onset  . Congestive Heart Failure Mother   . Diabetes Father   . Stroke Father   . Lung cancer Brother     ADVANCED DIRECTIVES (Y/N):  N  HEALTH MAINTENANCE: Social History   Tobacco Use  . Smoking status: Former Smoker    Packs/day: 1.00    Years: 40.00    Pack years: 40.00    Types: Cigarettes    Last attempt to quit: 1986    Years since quitting: 33.3  . Smokeless tobacco: Never Used  Substance Use Topics  . Alcohol use: No  . Drug use: Not Currently     Colonoscopy:  PAP:  Bone density:  Lipid panel:  Allergies  Allergen Reactions  . Pseudoephedrine Hcl Other (See Comments)    Other Reaction: ANURIA  . Ace Inhibitors Cough and Other (See Comments)    cough cough   . Other Other (See Comments)    Other Reaction: fluid retention Other Reaction: fluid retention  . Nsaids Other (See Comments)    Reaction:  Fluid retention   . Sudafed [Pseudoephedrine Hcl] Other (See Comments)    Pt states that it messes up his prostate.    Michail Sermon [Enalapril] Cough    Current Outpatient Medications  Medication Sig Dispense Refill  . carvedilol (COREG) 12.5 MG tablet Take 12.5 mg by mouth 2 (two) times daily.    . Cholecalciferol (VITAMIN D) 2000 UNITS CAPS Take 1 capsule by mouth daily.    Marland Kitchen ENTRESTO 24-26 MG     . ezetimibe (ZETIA) 10 MG tablet Take  10 mg by mouth at bedtime.    . furosemide (LASIX) 40 MG tablet Take 40 mg by mouth daily.    . metFORMIN (GLUCOPHAGE) 500 MG tablet Take 500 mg by mouth 2 (two) times daily.    Marland Kitchen warfarin (COUMADIN) 5 MG tablet Take 5 mg by mouth every evening.     No current facility-administered medications for this visit.     OBJECTIVE: Vitals:   10/02/17 1358  BP: (!) 100/58  Pulse: 60     Body mass index is 32.14 kg/m.    ECOG FS:0 - Asymptomatic  General: Well-developed, well-nourished, no acute distress. Eyes: Pink conjunctiva, anicteric sclera. HEENT: Normocephalic, moist mucous membranes, clear  oropharnyx. Lungs: Clear to auscultation bilaterally. Heart: Regular rate and rhythm. No rubs, murmurs, or gallops. Abdomen: Soft, nontender, nondistended. No organomegaly noted, normoactive bowel sounds. Musculoskeletal: No edema, cyanosis, or clubbing. Neuro: Alert, answering all questions appropriately. Cranial nerves grossly intact. Skin: No rashes or petechiae noted. Psych: Normal affect. Lymphatics: No cervical, calvicular, axillary or inguinal LAD.   LAB RESULTS:  Lab Results  Component Value Date   NA 139 12/29/2014   K 5.1 12/29/2014   CL 102 12/29/2014   CO2 29 12/29/2014   GLUCOSE 115 (H) 12/29/2014   BUN 30 (H) 12/29/2014   CREATININE 1.32 (H) 12/29/2014   CALCIUM 9.9 12/29/2014   PROT 6.6 07/31/2013   ALBUMIN 2.6 (L) 07/31/2013   AST 20 07/31/2013   ALT 27 07/31/2013   ALKPHOS 86 07/31/2013   BILITOT 1.2 (H) 07/31/2013   GFRNONAA 49 (L) 12/29/2014   GFRAA 57 (L) 12/29/2014    Lab Results  Component Value Date   WBC 5.5 12/29/2014   NEUTROABS 1.6 05/01/2014   HGB 13.1 12/29/2014   HCT 38.8 (L) 12/29/2014   MCV 91.6 12/29/2014   PLT 140 (L) 12/29/2014     STUDIES: No results found.  ASSESSMENT: Iron deficiency anemia.  PLAN:   1.  Iron deficiency anemia: Possibly from GI blood loss.  Patient's most recent hemoglobin is 9.6 with a total iron of 22 and a percent saturation of 6%.  He is also mildly symptomatic.  Proceed with 510 mg IV Feraheme today.  Return to clinic in 1 week for second dose.  Patient will then return to clinic in 2 months with repeat laboratory work and further evaluation.  If there is not resolution of his anemia, will consider full work-up at that time.  Approximately 45 minutes was spent in discussion of which greater than 50% was consultation.   Patient expressed understanding and was in agreement with this plan. He also understands that He can call clinic at any time with any questions, concerns, or complaints.    Lloyd Huger, MD   10/07/2017 7:19 AM

## 2017-10-09 ENCOUNTER — Inpatient Hospital Stay: Payer: Medicare Other

## 2017-10-09 VITALS — BP 93/60 | HR 60 | Temp 97.2°F | Resp 18

## 2017-10-09 DIAGNOSIS — D509 Iron deficiency anemia, unspecified: Secondary | ICD-10-CM

## 2017-10-09 MED ORDER — SODIUM CHLORIDE 0.9 % IV SOLN
Freq: Once | INTRAVENOUS | Status: AC
Start: 2017-10-09 — End: 2017-10-09
  Administered 2017-10-09: 14:00:00 via INTRAVENOUS
  Filled 2017-10-09: qty 1000

## 2017-10-09 MED ORDER — SODIUM CHLORIDE 0.9 % IV SOLN
510.0000 mg | Freq: Once | INTRAVENOUS | Status: AC
  Administered 2017-10-09: 510 mg via INTRAVENOUS
  Filled 2017-10-09: qty 17

## 2017-11-27 ENCOUNTER — Encounter: Payer: Medicare Other | Attending: Internal Medicine | Admitting: *Deleted

## 2017-11-27 DIAGNOSIS — I5022 Chronic systolic (congestive) heart failure: Secondary | ICD-10-CM

## 2017-11-27 NOTE — Progress Notes (Signed)
Incomplete Session Note  Patient Details  Name: Luis Palmer MRN: [id number] Date of Birth: 1932/08/19 Referring Provider:  Sanda Linger did not complete his rehab session. [name] came to Cardiac Rehab orientation but decided not to proceed with the program. Benefits of program were explained. He was informed that if he changes his mind, to call our office.

## 2017-12-03 NOTE — Progress Notes (Signed)
[place]  Telephone:(336) [phone] Fax:(336) [phone]  ID: Luis Palmer OB: 1933-02-23  MR#: [id number]  ALP#:[id number]  Patient Care Team: Patient, No Pcp Per as PCP - General (General Practice)  CHIEF COMPLAINT: Iron deficiency anemia.  INTERVAL HISTORY: Patient returns to clinic today for repeat laboratory work and further evaluation.  He states he continues to have chronic dizziness, but his weakness and fatigue have mildly improved.  He otherwise feels well.  He has no other neurologic complaints. He denies any recent fevers or illnesses.  He has a good appetite and denies weight loss.  He has no chest pain or shortness of breath.  He denies any nausea, vomiting, constipation, or diarrhea.  He has no melena or hematochezia.  He has no urinary complaints.  Patient offers no further specific complaints today.  REVIEW OF SYSTEMS:   Review of Systems  Constitutional: Negative.  Negative for fever, malaise/fatigue and weight loss.  Respiratory: Negative.  Negative for cough, hemoptysis and shortness of breath.   Cardiovascular: Negative.  Negative for chest pain and leg swelling.  Gastrointestinal: Negative.  Negative for abdominal pain, blood in stool and melena.  Genitourinary: Negative.  Negative for hematuria.  Musculoskeletal: Negative.  Negative for myalgias.  Skin: Negative.  Negative for rash.  Neurological: Positive for dizziness. Negative for sensory change, focal weakness and weakness.  Psychiatric/Behavioral: Negative.  The patient is not nervous/anxious.     As per HPI. Otherwise, a complete review of systems is negative.  PAST MEDICAL HISTORY: Past Medical History:  Diagnosis Date  . Anemia   . CHF (congestive heart failure) ([place])   . Diabetes mellitus without complication ([place])   . Hx of CABG     PAST SURGICAL HISTORY: Past Surgical History:  Procedure Laterality Date  . APPENDECTOMY    . CARDIAC SURGERY      FAMILY HISTORY: Family  History  Problem Relation Age of Onset  . Congestive Heart Failure Mother   . Diabetes Father   . Stroke Father   . Lung cancer Brother     ADVANCED DIRECTIVES (Y/N):  N  HEALTH MAINTENANCE: Social History   Tobacco Use  . Smoking status: Former Smoker    Packs/day: 1.00    Years: 40.00    Pack years: 40.00    Types: Cigarettes    Last attempt to quit: 1986    Years since quitting: 33.5  . Smokeless tobacco: Never Used  Substance Use Topics  . Alcohol use: No  . Drug use: Not Currently     Colonoscopy:  PAP:  Bone density:  Lipid panel:  Allergies  Allergen Reactions  . Pseudoephedrine Hcl Other (See Comments)    Other Reaction: ANURIA  . Ace Inhibitors Cough and Other (See Comments)    cough cough   . Other Other (See Comments)    Other Reaction: fluid retention Other Reaction: fluid retention  . Nsaids Other (See Comments)    Reaction:  Fluid retention   . Sudafed [Pseudoephedrine Hcl] Other (See Comments)    Pt states that it messes up his prostate.    Michail Sermon [Enalapril] Cough    Current Outpatient Medications  Medication Sig Dispense Refill  . carvedilol (COREG) 12.5 MG tablet Take 12.5 mg by mouth 2 (two) times daily.    . Cholecalciferol (VITAMIN D) 2000 UNITS CAPS Take 1 capsule by mouth daily.    Marland Kitchen ENTRESTO 24-26 MG     . ezetimibe (ZETIA) 10 MG tablet Take 10 mg  by mouth at bedtime.    . furosemide (LASIX) 40 MG tablet Take 40 mg by mouth daily.    . meclizine (ANTIVERT) 25 MG tablet Take 25 mg by mouth 3 (three) times daily as needed for dizziness.    . metFORMIN (GLUCOPHAGE) 500 MG tablet Take 500 mg by mouth 2 (two) times daily.    Marland Kitchen warfarin (COUMADIN) 5 MG tablet Take 5 mg by mouth every evening.      No current facility-administered medications for this visit.     OBJECTIVE: Vitals:   12/06/17 1116  BP: (!) 98/53  Pulse: 62  Resp: 18  Temp: (!) 96.5 F (35.8 C)  SpO2: 100%     Body mass index is 30.95 kg/m.    ECOG FS:0 -  Asymptomatic  General: Well-developed, well-nourished, no acute distress. Eyes: Pink conjunctiva, anicteric sclera. Lungs: Clear to auscultation bilaterally. Heart: Regular rate and rhythm. No rubs, murmurs, or gallops. Abdomen: Soft, nontender, nondistended. No organomegaly noted, normoactive bowel sounds. Musculoskeletal: No edema, cyanosis, or clubbing. Neuro: Alert, answering all questions appropriately. Cranial nerves grossly intact. Skin: No rashes or petechiae noted. Psych: Normal affect.  LAB RESULTS:  Lab Results  Component Value Date   NA 139 12/29/2014   K 5.1 12/29/2014   CL 102 12/29/2014   CO2 29 12/29/2014   GLUCOSE 115 (H) 12/29/2014   BUN 30 (H) 12/29/2014   CREATININE 1.32 (H) 12/29/2014   CALCIUM 9.9 12/29/2014   PROT 6.6 07/31/2013   ALBUMIN 2.6 (L) 07/31/2013   AST 20 07/31/2013   ALT 27 07/31/2013   ALKPHOS 86 07/31/2013   BILITOT 1.2 (H) 07/31/2013   GFRNONAA 49 (L) 12/29/2014   GFRAA 57 (L) 12/29/2014    Lab Results  Component Value Date   WBC 5.8 12/06/2017   NEUTROABS 2.9 12/06/2017   HGB 10.6 (L) 12/06/2017   HCT 31.2 (L) 12/06/2017   MCV 82.5 12/06/2017   PLT 161 12/06/2017   Lab Results  Component Value Date   IRON 27 (L) 12/06/2017   TIBC 262 12/06/2017   IRONPCTSAT 10 (L) 12/06/2017   Lab Results  Component Value Date   FERRITIN 144 12/06/2017      STUDIES: No results found.  ASSESSMENT: Iron deficiency anemia.  PLAN:   1.  Iron deficiency anemia: Possibly from GI blood loss.  Patient's hemoglobin has trended up to 10.6 and his symptoms have improved.  His iron stores have also improved, although still decreased.  He only received IV iron approximately 6 weeks ago and his blood counts may still be trending up.  No intervention is needed at this time.  Return to clinic in 3 months with repeat laboratory work, further evaluation, and consideration of additional IV Feraheme.  If there is not resolution of his anemia, will  consider full work-up at that time.  I spent a total of 20 minutes face-to-face with the patient of which greater than 50% of the visit was spent in counseling and coordination of care as detailed above.    Patient expressed understanding and was in agreement with this plan. He also understands that He can call clinic at any time with any questions, concerns, or complaints.    Lloyd Huger, MD   12/08/2017 5:23 PM

## 2017-12-06 ENCOUNTER — Inpatient Hospital Stay: Payer: Medicare Other

## 2017-12-06 ENCOUNTER — Inpatient Hospital Stay: Payer: Medicare Other | Attending: Oncology

## 2017-12-06 ENCOUNTER — Encounter: Payer: Self-pay | Admitting: Oncology

## 2017-12-06 ENCOUNTER — Inpatient Hospital Stay (HOSPITAL_BASED_OUTPATIENT_CLINIC_OR_DEPARTMENT_OTHER): Payer: Medicare Other | Admitting: Oncology

## 2017-12-06 VITALS — BP 98/53 | HR 62 | Temp 96.5°F | Resp 18 | Wt 228.2 lb

## 2017-12-06 DIAGNOSIS — D509 Iron deficiency anemia, unspecified: Secondary | ICD-10-CM

## 2017-12-06 LAB — CBC WITH DIFFERENTIAL/PLATELET
BASOS ABS: 0 10*3/uL (ref 0–0.1)
Basophils Relative: 1 %
EOS ABS: 0.2 10*3/uL (ref 0–0.7)
Eosinophils Relative: 3 %
HCT: 31.2 % — ABNORMAL LOW (ref 40.0–52.0)
Hemoglobin: 10.6 g/dL — ABNORMAL LOW (ref 13.0–18.0)
LYMPHS PCT: 31 %
Lymphs Abs: 1.8 10*3/uL (ref 1.0–3.6)
MCH: 28 pg (ref 26.0–34.0)
MCHC: 33.9 g/dL (ref 32.0–36.0)
MCV: 82.5 fL (ref 80.0–100.0)
Monocytes Absolute: 0.9 10*3/uL (ref 0.2–1.0)
Monocytes Relative: 16 %
NEUTROS ABS: 2.9 10*3/uL (ref 1.4–6.5)
Neutrophils Relative %: 49 %
PLATELETS: 161 10*3/uL (ref 150–440)
RBC: 3.78 MIL/uL — AB (ref 4.40–5.90)
RDW: 18 % — AB (ref 11.5–14.5)
WBC: 5.8 10*3/uL (ref 3.8–10.6)

## 2017-12-06 LAB — IRON AND TIBC
IRON: 27 ug/dL — AB (ref 45–182)
SATURATION RATIOS: 10 % — AB (ref 17.9–39.5)
TIBC: 262 ug/dL (ref 250–450)
UIBC: 235 ug/dL

## 2017-12-06 LAB — FERRITIN: Ferritin: 144 ng/mL (ref 24–336)

## 2017-12-06 NOTE — Progress Notes (Signed)
Pt in for follow up, reports "ongoing dizziness".  States fatigue and energy level has improved some.

## 2018-01-21 ENCOUNTER — Other Ambulatory Visit: Payer: Self-pay

## 2018-01-21 ENCOUNTER — Inpatient Hospital Stay
Admission: EM | Admit: 2018-01-21 | Discharge: 2018-01-25 | DRG: 374 | Disposition: A | Discharge status: Other Acute Inpatient Hospital | Payer: Medicare Other | Attending: Internal Medicine | Admitting: Internal Medicine

## 2018-01-21 ENCOUNTER — Encounter: Payer: Self-pay | Admitting: Emergency Medicine

## 2018-01-21 DIAGNOSIS — Z823 Family history of stroke: Secondary | ICD-10-CM

## 2018-01-21 DIAGNOSIS — Z833 Family history of diabetes mellitus: Secondary | ICD-10-CM | POA: Diagnosis not present

## 2018-01-21 DIAGNOSIS — D62 Acute posthemorrhagic anemia: Secondary | ICD-10-CM | POA: Diagnosis present

## 2018-01-21 DIAGNOSIS — K2970 Gastritis, unspecified, without bleeding: Secondary | ICD-10-CM | POA: Diagnosis present

## 2018-01-21 DIAGNOSIS — Z7901 Long term (current) use of anticoagulants: Secondary | ICD-10-CM

## 2018-01-21 DIAGNOSIS — Z79899 Other long term (current) drug therapy: Secondary | ICD-10-CM

## 2018-01-21 DIAGNOSIS — R791 Abnormal coagulation profile: Secondary | ICD-10-CM

## 2018-01-21 DIAGNOSIS — Z95810 Presence of automatic (implantable) cardiac defibrillator: Secondary | ICD-10-CM

## 2018-01-21 DIAGNOSIS — Z9049 Acquired absence of other specified parts of digestive tract: Secondary | ICD-10-CM

## 2018-01-21 DIAGNOSIS — D509 Iron deficiency anemia, unspecified: Secondary | ICD-10-CM | POA: Diagnosis present

## 2018-01-21 DIAGNOSIS — K921 Melena: Secondary | ICD-10-CM | POA: Diagnosis not present

## 2018-01-21 DIAGNOSIS — I9589 Other hypotension: Secondary | ICD-10-CM | POA: Diagnosis present

## 2018-01-21 DIAGNOSIS — I5022 Chronic systolic (congestive) heart failure: Secondary | ICD-10-CM | POA: Diagnosis present

## 2018-01-21 DIAGNOSIS — I481 Persistent atrial fibrillation: Secondary | ICD-10-CM | POA: Diagnosis present

## 2018-01-21 DIAGNOSIS — D123 Benign neoplasm of transverse colon: Secondary | ICD-10-CM

## 2018-01-21 DIAGNOSIS — C182 Malignant neoplasm of ascending colon: Principal | ICD-10-CM | POA: Diagnosis present

## 2018-01-21 DIAGNOSIS — E119 Type 2 diabetes mellitus without complications: Secondary | ICD-10-CM | POA: Diagnosis present

## 2018-01-21 DIAGNOSIS — Z87891 Personal history of nicotine dependence: Secondary | ICD-10-CM | POA: Diagnosis not present

## 2018-01-21 DIAGNOSIS — K859 Acute pancreatitis without necrosis or infection, unspecified: Secondary | ICD-10-CM | POA: Diagnosis present

## 2018-01-21 DIAGNOSIS — I4891 Unspecified atrial fibrillation: Secondary | ICD-10-CM | POA: Diagnosis not present

## 2018-01-21 DIAGNOSIS — Z951 Presence of aortocoronary bypass graft: Secondary | ICD-10-CM | POA: Diagnosis not present

## 2018-01-21 DIAGNOSIS — N179 Acute kidney failure, unspecified: Secondary | ICD-10-CM | POA: Diagnosis not present

## 2018-01-21 DIAGNOSIS — I2510 Atherosclerotic heart disease of native coronary artery without angina pectoris: Secondary | ICD-10-CM | POA: Diagnosis present

## 2018-01-21 DIAGNOSIS — R42 Dizziness and giddiness: Secondary | ICD-10-CM | POA: Diagnosis present

## 2018-01-21 DIAGNOSIS — D490 Neoplasm of unspecified behavior of digestive system: Secondary | ICD-10-CM

## 2018-01-21 DIAGNOSIS — T45515A Adverse effect of anticoagulants, initial encounter: Secondary | ICD-10-CM | POA: Diagnosis present

## 2018-01-21 DIAGNOSIS — R55 Syncope and collapse: Secondary | ICD-10-CM

## 2018-01-21 DIAGNOSIS — Z801 Family history of malignant neoplasm of trachea, bronchus and lung: Secondary | ICD-10-CM

## 2018-01-21 DIAGNOSIS — Z888 Allergy status to other drugs, medicaments and biological substances status: Secondary | ICD-10-CM

## 2018-01-21 DIAGNOSIS — E785 Hyperlipidemia, unspecified: Secondary | ICD-10-CM | POA: Diagnosis present

## 2018-01-21 DIAGNOSIS — C189 Malignant neoplasm of colon, unspecified: Secondary | ICD-10-CM | POA: Diagnosis not present

## 2018-01-21 DIAGNOSIS — K922 Gastrointestinal hemorrhage, unspecified: Secondary | ICD-10-CM | POA: Diagnosis not present

## 2018-01-21 DIAGNOSIS — Z8249 Family history of ischemic heart disease and other diseases of the circulatory system: Secondary | ICD-10-CM | POA: Diagnosis not present

## 2018-01-21 DIAGNOSIS — D500 Iron deficiency anemia secondary to blood loss (chronic): Secondary | ICD-10-CM | POA: Diagnosis not present

## 2018-01-21 DIAGNOSIS — I482 Chronic atrial fibrillation: Secondary | ICD-10-CM | POA: Diagnosis present

## 2018-01-21 DIAGNOSIS — K810 Acute cholecystitis: Secondary | ICD-10-CM | POA: Diagnosis present

## 2018-01-21 DIAGNOSIS — I255 Ischemic cardiomyopathy: Secondary | ICD-10-CM | POA: Diagnosis present

## 2018-01-21 DIAGNOSIS — Z886 Allergy status to analgesic agent status: Secondary | ICD-10-CM

## 2018-01-21 DIAGNOSIS — Z7984 Long term (current) use of oral hypoglycemic drugs: Secondary | ICD-10-CM

## 2018-01-21 DIAGNOSIS — K625 Hemorrhage of anus and rectum: Secondary | ICD-10-CM

## 2018-01-21 HISTORY — DX: Unspecified atrial fibrillation: I48.91

## 2018-01-21 LAB — PROTIME-INR
INR: 3.73
INR: 1.59
INR: 1.26
PROTHROMBIN TIME: 18.8 s — AB (ref 11.4–15.2)
Prothrombin Time: 36.6 seconds — ABNORMAL HIGH (ref 11.4–15.2)
Prothrombin Time: 15.7 seconds — ABNORMAL HIGH (ref 11.4–15.2)

## 2018-01-21 LAB — CBC
HEMATOCRIT: 23.2 % — AB (ref 40.0–52.0)
HEMOGLOBIN: 7.8 g/dL — AB (ref 13.0–18.0)
MCH: 27.7 pg (ref 26.0–34.0)
MCHC: 33.7 g/dL (ref 32.0–36.0)
MCV: 82.2 fL (ref 80.0–100.0)
Platelets: 136 10*3/uL — ABNORMAL LOW (ref 150–440)
RBC: 2.82 MIL/uL — ABNORMAL LOW (ref 4.40–5.90)
RDW: 17.2 % — AB (ref 11.5–14.5)
WBC: 5.3 10*3/uL (ref 3.8–10.6)

## 2018-01-21 LAB — BASIC METABOLIC PANEL
ANION GAP: 8 (ref 5–15)
BUN: 41 mg/dL — AB (ref 8–23)
CHLORIDE: 106 mmol/L (ref 98–111)
CO2: 23 mmol/L (ref 22–32)
Calcium: 8.8 mg/dL — ABNORMAL LOW (ref 8.9–10.3)
Creatinine, Ser: 1.44 mg/dL — ABNORMAL HIGH (ref 0.61–1.24)
GFR calc Af Amer: 50 mL/min — ABNORMAL LOW (ref >60)
GFR, EST NON AFRICAN AMERICAN: 43 mL/min — AB (ref >60)
Glucose, Bld: 161 mg/dL — ABNORMAL HIGH (ref 70–99)
POTASSIUM: 4.2 mmol/L (ref 3.5–5.1)
Sodium: 137 mmol/L (ref 135–145)

## 2018-01-21 LAB — URINALYSIS, COMPLETE (UACMP) WITH MICROSCOPIC
BACTERIA UA: NONE SEEN
BILIRUBIN URINE: NEGATIVE
Glucose, UA: NEGATIVE mg/dL
Hgb urine dipstick: NEGATIVE
KETONES UR: NEGATIVE mg/dL
LEUKOCYTES UA: NEGATIVE
Nitrite: NEGATIVE
PROTEIN: NEGATIVE mg/dL
SPECIFIC GRAVITY, URINE: 1.009 (ref 1.005–1.030)
pH: 5 (ref 5.0–8.0)

## 2018-01-21 LAB — PREPARE RBC (CROSSMATCH)

## 2018-01-21 LAB — GLUCOSE, CAPILLARY
GLUCOSE-CAPILLARY: 157 mg/dL — AB (ref 70–99)
GLUCOSE-CAPILLARY: 109 mg/dL — AB (ref 70–99)
Glucose-Capillary: 153 mg/dL — ABNORMAL HIGH (ref 70–99)

## 2018-01-21 LAB — HEMOGLOBIN
Hemoglobin: 7.7 g/dL — ABNORMAL LOW (ref 13.0–18.0)
Hemoglobin: 9 g/dL — ABNORMAL LOW (ref 13.0–18.0)

## 2018-01-21 LAB — ABO/RH: ABO/RH(D): O NEG

## 2018-01-21 MED ORDER — SODIUM CHLORIDE 0.9 % IV BOLUS: 1000.0000 mL | Freq: Once | INTRAVENOUS | Status: DC

## 2018-01-21 MED ORDER — SODIUM CHLORIDE 0.9 % IV SOLN
INTRAVENOUS | Status: DC
  Administered 2018-01-21 – 2018-01-23 (×6): via INTRAVENOUS

## 2018-01-21 MED ORDER — SODIUM CHLORIDE 0.9 % IV BOLUS
1000.0000 mL | Freq: Once | INTRAVENOUS | Status: AC
  Administered 2018-01-21: 1000 mL via INTRAVENOUS

## 2018-01-21 MED ORDER — ONDANSETRON HCL 4 MG PO TABS: 4.0000 mg | ORAL_TABLET | Freq: Four times a day (QID) | ORAL | Status: DC | PRN

## 2018-01-21 MED ORDER — PROTHROMBIN COMPLEX CONC HUMAN 500 UNITS IV KIT
1500.0000 [IU] | PACK | Freq: Once | Status: AC
  Administered 2018-01-21: 1500 [IU] via INTRAVENOUS
  Filled 2018-01-21: qty 1000

## 2018-01-21 MED ORDER — INSULIN ASPART 100 UNIT/ML ~~LOC~~ SOLN
0.0000 [IU] | Freq: Three times a day (TID) | SUBCUTANEOUS | Status: DC
  Administered 2018-01-21: 2 [IU] via SUBCUTANEOUS
  Administered 2018-01-22 – 2018-01-23 (×2): 1 [IU] via SUBCUTANEOUS
  Administered 2018-01-23: 2 [IU] via SUBCUTANEOUS
  Administered 2018-01-24: 1 [IU] via SUBCUTANEOUS
  Administered 2018-01-24: 2 [IU] via SUBCUTANEOUS
  Administered 2018-01-24: 3 [IU] via SUBCUTANEOUS
  Administered 2018-01-25: 1 [IU] via SUBCUTANEOUS
  Administered 2018-01-25: 3 [IU] via SUBCUTANEOUS
  Filled 2018-01-21 (×9): qty 1

## 2018-01-21 MED ORDER — ACETAMINOPHEN 325 MG PO TABS: 650.0000 mg | ORAL_TABLET | Freq: Four times a day (QID) | ORAL | Status: DC | PRN

## 2018-01-21 MED ORDER — ONDANSETRON HCL 4 MG/2ML IJ SOLN
4.0000 mg | Freq: Four times a day (QID) | INTRAMUSCULAR | Status: DC | PRN
  Administered 2018-01-22: 4 mg via INTRAVENOUS
  Filled 2018-01-21: qty 2

## 2018-01-21 MED ORDER — VITAMIN K1 10 MG/ML IJ SOLN
10.0000 mg | Freq: Once | INTRAVENOUS | Status: AC
  Administered 2018-01-21: 10 mg via INTRAVENOUS
  Filled 2018-01-21: qty 1

## 2018-01-21 MED ORDER — EZETIMIBE 10 MG PO TABS
10.0000 mg | ORAL_TABLET | Freq: Every day | ORAL | Status: DC
Start: 2018-01-21 — End: 2018-01-25
  Administered 2018-01-21 – 2018-01-24 (×4): 10 mg via ORAL
  Filled 2018-01-21 (×5): qty 1

## 2018-01-21 MED ORDER — SODIUM CHLORIDE 0.9% IV SOLUTION
Freq: Once | INTRAVENOUS | Status: AC
  Administered 2018-01-21: 15:00:00 via INTRAVENOUS

## 2018-01-21 MED ORDER — ACETAMINOPHEN 650 MG RE SUPP: 650.0000 mg | Freq: Four times a day (QID) | RECTAL | Status: DC | PRN

## 2018-01-21 MED ORDER — PANTOPRAZOLE SODIUM 40 MG IV SOLR
40.0000 mg | Freq: Two times a day (BID) | INTRAVENOUS | Status: DC
  Administered 2018-01-21 – 2018-01-22 (×3): 40 mg via INTRAVENOUS
  Filled 2018-01-21 (×3): qty 40

## 2018-01-21 NOTE — ED Notes (Signed)
Pt given specimen cup for urine collection. Pt assisted to toilet. Wife at bedside and will call out when pt is done.

## 2018-01-21 NOTE — ED Triage Notes (Signed)
Pt presents with dizziness this morning; states he has CHF and has had many medication changes trying to mitigate dizziness. Pt's wife states the diastolic was 39 this morning, and it has never been that low. Pt alert & oriented with NAD noted.

## 2018-01-21 NOTE — Progress Notes (Signed)
Notified MD pt INR 1.59 and PT 18.8 after adminstering vit K. No new orders given. Pt will currently getting a blood transfusion and will get an EGD tomorrow.

## 2018-01-21 NOTE — ED Notes (Signed)
Pharmacy called and Luis Palmer states they will send medications up to room 223 for pt

## 2018-01-21 NOTE — ED Triage Notes (Signed)
First RN: Pt presents to ED via POV with c/o hypotension, pt's wife reports that sitting BP 70/40, standing 50/39. Pt reports dizziness, but "that's nothing new in the mornings". Pt is alert and oriented, ambulatory to the lobby with steady gait at this time.

## 2018-01-21 NOTE — Consult Note (Signed)
Vonda Antigua, MD [address] [place], [place], [place], [address] [address], [place], [place], [place], [address] Phone: [phone]  Fax: [phone]  Consultation  Referring Provider:     Dr. Posey Pronto Primary Care Physician:  Idelle Crouch, MD Reason for Consultation: GI bleed  Date of Admission:  01/21/2018 Date of Consultation:  01/21/2018         HPI:   Luis Palmer is a 84 y.o. male with history of A. fib, on Coumadin, presents with dizziness and low blood pressure.  Also complains of dark stool at home, intermittently for the last 3 weeks.  1-2 bowel movements a day.  Daughter at bedside who states the stool has been foul-smelling.  States today on wiping they noted some bright red blood on the gauze or tissue paper itself, but not in stool.  No prior episodes of GI bleed.  No abdominal pain, hematemesis, nausea or vomiting.  Reports Goodie powder use twice a week.  No prior EGD, reports colonoscopy over 10 years ago that he states was normal.  Procedure report not available.  No family history of colon cancer.   Past Medical History:  Diagnosis Date  . Anemia   . Atrial fibrillation ([place])   . CHF (congestive heart failure) ([place])   . Diabetes mellitus without complication ([place])   . Hx of CABG     Past Surgical History:  Procedure Laterality Date  . APPENDECTOMY    . CARDIAC DEFIBRILLATOR PLACEMENT    . CARDIAC SURGERY    . PACEMAKER IMPLANT      Prior to Admission medications   Medication Sig Start Date End Date Taking? Authorizing Provider  carvedilol (COREG) 12.5 MG tablet Take 12.5 mg by mouth 2 (two) times daily.   Yes [provider]  Cholecalciferol (VITAMIN D) 2000 UNITS CAPS Take 1 capsule by mouth daily.   Yes [provider]  COUMADIN 5 MG tablet Take 2.5-5 mg by mouth daily. Take 5 mg by mouth 4 days a week. Take 2.5 mg by mouth 3 days a week.   Yes [provider]  donepezil (ARICEPT) 10 MG tablet Take 10 mg  by mouth at bedtime.   Yes [provider]  ezetimibe (ZETIA) 10 MG tablet Take 10 mg by mouth at bedtime.   Yes [provider]  furosemide (LASIX) 40 MG tablet Take 40 mg by mouth daily.   Yes [provider]  losartan (COZAAR) 25 MG tablet Take 25 mg by mouth daily. 01/04/18  Yes [provider]  meclizine (ANTIVERT) 25 MG tablet Take 25 mg by mouth daily.    Yes [provider]  metFORMIN (GLUCOPHAGE) 500 MG tablet Take 500 mg by mouth 2 (two) times daily.   Yes [provider]  rosuvastatin (CRESTOR) 10 MG tablet Take 10 mg by mouth daily. 12/30/17  Yes [provider]    Family History  Problem Relation Age of Onset  . Congestive Heart Failure Mother   . Diabetes Father   . Stroke Father   . Lung cancer Brother      Social History   Tobacco Use  . Smoking status: Former Smoker    Packs/day: 1.00    Years: 40.00    Pack years: 40.00    Types: Cigarettes    Last attempt to quit: 1986    Years since quitting: 33.6  . Smokeless tobacco: Never Used  Substance Use Topics  . Alcohol use: No  .  Drug use: Not Currently    Allergies as of 01/21/2018 - Review Complete 01/21/2018  Allergen Reaction Noted  . Pseudoephedrine hcl Other (See Comments) 10/02/2017  . Ace inhibitors Cough and Other (See Comments) 11/21/2011  . Other Other (See Comments) 11/21/2011  . Nsaids Other (See Comments) 12/29/2014  . Sudafed [pseudoephedrine hcl] Other (See Comments) 12/29/2014  . Vasotec [enalapril] Cough 12/29/2014    Review of Systems:    All systems reviewed and negative except where noted in HPI.   Physical Exam:  Vital signs in last 24 hours: Vitals:   01/21/18 1100 01/21/18 1215 01/21/18 1230 01/21/18 1302  BP: 109/78 (!) 92/41 (!) 98/50 (!) 107/58  Pulse: 80 (!) 59  60  Resp: (!) 21 20 17 18   Temp:    97.6 F (36.4 C)  TempSrc:    Oral  SpO2: 98% 99%  100%  Weight:      Height:       Last BM Date:  01/21/18 General:   Pleasant, cooperative in NAD Head:  Normocephalic and atraumatic. Eyes:   No icterus.   Conjunctiva pink. PERRLA. Ears:  Normal auditory acuity. Neck:  Supple; no masses or thyroidomegaly Lungs: Respirations even and unlabored. Lungs clear to auscultation bilaterally.   No wheezes, crackles, or rhonchi.  Abdomen:  Soft, nondistended, nontender. Normal bowel sounds. No appreciable masses or hepatomegaly.  No rebound or guarding.  Neurologic:  Alert and oriented x3;  grossly normal neurologically. Skin:  Intact without significant lesions or rashes. Cervical Nodes:  No significant cervical adenopathy. Psych:  Alert and cooperative. Normal affect.  LAB RESULTS: Recent Labs    01/21/18 1015 01/21/18 1218  WBC 5.3  --   HGB 7.8* 7.7*  HCT 23.2*  --   PLT 136*  --    BMET Recent Labs    01/21/18 1015  NA 137  K 4.2  CL 106  CO2 23  GLUCOSE 161*  BUN 41*  CREATININE 1.44*  CALCIUM 8.8*   LFT No results for input(s): PROT, ALBUMIN, AST, ALT, ALKPHOS, BILITOT, BILIDIR, IBILI in the last 72 hours. PT/INR Recent Labs    01/21/18 1121  LABPROT 36.6*  INR 3.73    STUDIES: No results found.    Impression / Plan:   Luis Palmer is a 84 y.o. y/o male with history of A. fib on Coumadin, and supratherapeutic INR on presentation, with dark stool at home for the last 3 weeks  Possible upper GI bleed from peptic ulcer disease from Goodie powder use at home However, ER note stating hematochezia noted on rectal exam Acute drop in hemoglobin is more consistent with an upper GI bleed and a lower GI bleed, although a lower GI bleed is also on the differential  Will need EGD for evaluation, and possible therapeutic intervention if needed  Correct INR to less than 1.5 with vitamin K and FFP's Vitamin K and Kcentra given in the ER Endoscopic hemostasis is considered a high risk procedure, and as per guidelines and recommendations, anticoagulation should be held  prior to the procedure.  Endoscopic hemostasis in the setting of supratherapeutic INR can lead to more bleeding at the time of the therapeutic intervention, therefore it would be safest to correct INR prior to endoscopy.  If EGD is negative, can proceed with colonoscopy prep   If patient has signs of active GI bleeding please page GI, for evaluation of need of procedure prior to INR correction Currently patient is hemodynamically stable  PPI IV  twice daily  Continue serial CBCs and transfuse PRN Avoid NSAIDs Maintain 2 large-bore IV lines  Please page GI with any acute hemodynamic changes, or signs of active GI bleeding   Thank you for involving me in the care of this patient.      LOS: 0 days   Virgel Manifold, MD  01/21/2018, 2:22 PM

## 2018-01-21 NOTE — ED Notes (Signed)
Taking PT to the floor.

## 2018-01-21 NOTE — ED Notes (Signed)
EDP [place] notified BP 83/45, see orders

## 2018-01-21 NOTE — ED Notes (Signed)
Waiting on medications to be sent up from Pharmacy

## 2018-01-21 NOTE — Progress Notes (Signed)
Advanced care plan.  Purpose of the Encounter: CODE STATUS  Parties in Attendance: Patient himself and wife  Patient's Decision Capacity: Intact  Subjective/Patient's story: Patient is a 84 year old white male with history of coronary artery disease atrial fibrillation presenting with bright red blood per rectum noted to be anemic   Objective/Medical story I discussed with the patient regarding his desire for cardiac and pulmonary resuscitation he states that he would like everything done for now would not want to live in a persistent vegetative state   Goals of care determination: Full code    CODE STATUS: Full code   Time spent discussing advanced care planning: 16 minutes

## 2018-01-21 NOTE — H&P (Signed)
[facility] NAME: Luis Palmer    MR#:  [id number]  DATE OF BIRTH:  01/11/1933  DATE OF ADMISSION:  01/21/2018  PRIMARY CARE PHYSICIAN: Idelle Crouch, MD   REQUESTING/REFERRING PHYSICIAN: Orbie Pyo, MD  CHIEF COMPLAINT:   Chief Complaint  Patient presents with  . Dizziness    HISTORY OF PRESENT ILLNESS: Luis Palmer  is a 84 y.o. male with a known history of atrial fibrillation, anemia, CHF, diabetes who is presenting to the hospital with complaint of dizziness and low blood pressure.  While in the ER patient is noted to have bright red blood mixed with some dark blood in the stool.  Patient's hemoglobin is lower.  Were asked to admit him for a lower GI bleed.  Patient states that he has not noticed any blood until today.  He felt little dizzy has chronic dizziness but it was worse today.  His blood pressure was low as well.  He denies any abdominal pain no nausea vomiting.  Vision is on chronic Coumadin and his INR was elevated at ER physician gave him vitamin K and Kcentra.   PAST MEDICAL HISTORY:   Past Medical History:  Diagnosis Date  . Anemia   . Atrial fibrillation ([place])   . CHF (congestive heart failure) ([place])   . Diabetes mellitus without complication ([place])   . Hx of CABG     PAST SURGICAL HISTORY:  Past Surgical History:  Procedure Laterality Date  . APPENDECTOMY    . CARDIAC DEFIBRILLATOR PLACEMENT    . CARDIAC SURGERY    . PACEMAKER IMPLANT      SOCIAL HISTORY:  Social History   Tobacco Use  . Smoking status: Former Smoker    Packs/day: 1.00    Years: 40.00    Pack years: 40.00    Types: Cigarettes    Last attempt to quit: 1986    Years since quitting: 33.6  . Smokeless tobacco: Never Used  Substance Use Topics  . Alcohol use: No    FAMILY HISTORY:  Family History  Problem Relation Age of Onset  . Congestive Heart Failure Mother   . Diabetes Father   . Stroke Father   . Lung  cancer Brother     DRUG ALLERGIES:  Allergies  Allergen Reactions  . Pseudoephedrine Hcl Other (See Comments)    Other Reaction: ANURIA  . Ace Inhibitors Cough and Other (See Comments)    cough cough   . Other Other (See Comments)    Other Reaction: fluid retention Other Reaction: fluid retention  . Nsaids Other (See Comments)    Reaction:  Fluid retention   . Sudafed [Pseudoephedrine Hcl] Other (See Comments)    Pt states that it messes up his prostate.    . Vasotec [Enalapril] Cough    REVIEW OF SYSTEMS:   CONSTITUTIONAL: No fever, positive fatigue or positive weakness.  EYES: No blurred or double vision.  EARS, NOSE, AND THROAT: No tinnitus or ear pain.  RESPIRATORY: No cough, shortness of breath, wheezing or hemoptysis.  CARDIOVASCULAR: No chest pain, orthopnea, edema.  GASTROINTESTINAL: No nausea, vomiting, diarrhea or abdominal pain.  Positive bright red blood per rectum GENITOURINARY: No dysuria, hematuria.  ENDOCRINE: No polyuria, nocturia,  HEMATOLOGY: No anemia, easy bruising or bleeding SKIN: No rash or lesion. MUSCULOSKELETAL: No joint pain or arthritis.   NEUROLOGIC: No tingling, numbness, weakness.  PSYCHIATRY: No anxiety or depression.   MEDICATIONS AT HOME:  Prior to  Admission medications   Medication Sig Start Date End Date Taking? Authorizing Provider  carvedilol (COREG) 12.5 MG tablet Take 12.5 mg by mouth 2 (two) times daily.    [provider]  Cholecalciferol (VITAMIN D) 2000 UNITS CAPS Take 1 capsule by mouth daily.    [provider]  ENTRESTO 24-26 MG  09/18/17   [provider]  ezetimibe (ZETIA) 10 MG tablet Take 10 mg by mouth at bedtime.    [provider]  furosemide (LASIX) 40 MG tablet Take 40 mg by mouth daily.    [provider]  meclizine (ANTIVERT) 25 MG tablet Take 25 mg by mouth 3 (three) times daily as needed for dizziness.    [provider]  metFORMIN (GLUCOPHAGE) 500 MG tablet  Take 500 mg by mouth 2 (two) times daily.    [provider]  warfarin (COUMADIN) 5 MG tablet Take 5 mg by mouth every evening.     [provider]      PHYSICAL EXAMINATION:   VITAL SIGNS: Blood pressure 109/78, pulse 80, temperature (!) 97.5 F (36.4 C), temperature source Oral, resp. rate (!) 21, height 6' (1.829 m), weight 99.8 kg, SpO2 98 %.  GENERAL:  84 y.o.-year-old patient lying in the bed with no acute distress.  EYES: Pupils equal, round, reactive to light and accommodation. No scleral icterus. Extraocular muscles intact.  HEENT: Head atraumatic, normocephalic. Oropharynx and nasopharynx clear.  NECK:  Supple, no jugular venous distention. No thyroid enlargement, no tenderness.  LUNGS: Normal breath sounds bilaterally, no wheezing, rales,rhonchi or crepitation. No use of accessory muscles of respiration.  CARDIOVASCULAR: S1, S2 normal. No murmurs, rubs, or gallops.  ABDOMEN: Soft, nontender, nondistended. Bowel sounds present. No organomegaly or mass.  EXTREMITIES: No pedal edema, cyanosis, or clubbing.  NEUROLOGIC: Cranial nerves II through XII are intact. Muscle strength 5/5 in all extremities. Sensation intact. Gait not checked.  PSYCHIATRIC: The patient is alert and oriented x 3.  SKIN: No obvious rash, lesion, or ulcer.   LABORATORY PANEL:   CBC Recent Labs  Lab 01/21/18 1015  WBC 5.3  HGB 7.8*  HCT 23.2*  PLT 136*  MCV 82.2  MCH 27.7  MCHC 33.7  RDW 17.2*   ------------------------------------------------------------------------------------------------------------------  Chemistries  Recent Labs  Lab 01/21/18 1015  NA 137  K 4.2  CL 106  CO2 23  GLUCOSE 161*  BUN 41*  CREATININE 1.44*  CALCIUM 8.8*   ------------------------------------------------------------------------------------------------------------------ estimated creatinine clearance is 47.6 mL/min (A) (by C-G formula based on SCr of 1.44 mg/dL  (H)). ------------------------------------------------------------------------------------------------------------------ No results for input(s): TSH, T4TOTAL, T3FREE, THYROIDAB in the last 72 hours.  Invalid input(s): FREET3   Coagulation profile Recent Labs  Lab 01/21/18 1121  INR 3.73   ------------------------------------------------------------------------------------------------------------------- No results for input(s): DDIMER in the last 72 hours. -------------------------------------------------------------------------------------------------------------------  Cardiac Enzymes No results for input(s): CKMB, TROPONINI, MYOGLOBIN in the last 168 hours.  Invalid input(s): CK ------------------------------------------------------------------------------------------------------------------ Invalid input(s): POCBNP  ---------------------------------------------------------------------------------------------------------------  Urinalysis    Component Value Date/Time   COLORURINE Yellow 08/04/2013 1256   APPEARANCEUR Clear 08/04/2013 1256   LABSPEC 1.012 08/04/2013 1256   PHURINE 5.0 08/04/2013 1256   GLUCOSEU 50 mg/dL 08/04/2013 1256   HGBUR 2+ 08/04/2013 1256   BILIRUBINUR Negative 08/04/2013 1256   KETONESUR Negative 08/04/2013 1256   PROTEINUR Negative 08/04/2013 1256   NITRITE Negative 08/04/2013 1256   LEUKOCYTESUR Negative 08/04/2013 1256     RADIOLOGY: No results found.  EKG: Orders placed or performed during the  hospital encounter of 01/21/18  . ED EKG  . ED EKG    IMPRESSION AND PLAN: Patient is 84 year old with history of coronary artery disease atrial fibrillation on chronic Coumadin with bright red blood per rectum  1.  Acute lower GI bleed likely diverticular in nature Patient's hemoglobin is likely lower than what it showing currently I have consented for him to receive 1 unit of packed RBCs will follow hemoglobin closely GI consult Patient  if he rebleeds will need a bleeding scan He has received vitamin K and be receiving Kcentra for his elevated INR hold Coumadin for now  2.  Hypotension blood pressure currently improved give IV fluids monitor blood pressure hold antihypertensive medication  3.  Hyperlipidemia continue Zetia  4.  Diabetes type 2 Hold Metformin Place on sliding scale insulin for now  5.  Miscellaneous SCDs for DVT prophylaxis   All the records are reviewed and case discussed with ED provider. Management plans discussed with the patient, family and they are in agreement.  CODE STATUS: Advance Directive Documentation     Most Recent Value  Type of Advance Directive  Healthcare Power of Attorney, Living will  Pre-existing out of facility DNR order (yellow form or pink MOST form)  -  "MOST" Form in Place?  -       TOTAL TIME TAKING CARE OF THIS PATIENT:86minutes.    Dustin Flock M.D on 01/21/2018 at 12:11 PM  Between 7am to 6pm - Pager - [phone]  After 6pm go to [email or web address] - password [email or web address] [facility]  [phone]  CC: Primary care physician; Idelle Crouch, MD

## 2018-01-21 NOTE — ED Provider Notes (Signed)
[facility] Emergency Department Provider Note ____________________________________________   First MD Initiated Contact with Patient 01/21/18 [phone]     (approximate)  I have reviewed the triage vital signs and the nursing notes.   HISTORY  Chief Complaint Dizziness  HPI KEYVON HERTER is a 84 y.o. male history of anemia, CHF and diabetes who is presenting to the emergency department today with lightheadedness.  He says this has been ongoing for approximately 1 year but was worse this morning.  His wife found him with a blood pressure of 50 over 30s while standing.  He denies any pain or shortness of breath at this time and says that his lightheadedness is not decreased.  He says that he is also had iron deficiency anemia requiring iron infusions.  Denies any bleeding in his stool.  Denies any chest pain or shortness of breath.  Past Medical History:  Diagnosis Date  . Anemia   . CHF (congestive heart failure) ([place])   . Diabetes mellitus without complication ([place])   . Hx of CABG     Patient Active Problem List   Diagnosis Date Noted  . Iron deficiency anemia 10/02/2017    Past Surgical History:  Procedure Laterality Date  . APPENDECTOMY    . CARDIAC SURGERY      Prior to Admission medications   Medication Sig Start Date End Date Taking? Authorizing Provider  carvedilol (COREG) 12.5 MG tablet Take 12.5 mg by mouth 2 (two) times daily.    [provider]  Cholecalciferol (VITAMIN D) 2000 UNITS CAPS Take 1 capsule by mouth daily.    [provider]  ENTRESTO 24-26 MG  09/18/17   [provider]  ezetimibe (ZETIA) 10 MG tablet Take 10 mg by mouth at bedtime.    [provider]  furosemide (LASIX) 40 MG tablet Take 40 mg by mouth daily.    [provider]  meclizine (ANTIVERT) 25 MG tablet Take 25 mg by mouth 3 (three) times daily as needed for dizziness.    [provider]  metFORMIN (GLUCOPHAGE) 500  MG tablet Take 500 mg by mouth 2 (two) times daily.    [provider]  warfarin (COUMADIN) 5 MG tablet Take 5 mg by mouth every evening.     [provider]    Allergies Pseudoephedrine hcl; Ace inhibitors; Other; Nsaids; Sudafed [pseudoephedrine hcl]; and Vasotec [enalapril]  Family History  Problem Relation Age of Onset  . Congestive Heart Failure Mother   . Diabetes Father   . Stroke Father   . Lung cancer Brother     Social History Social History   Tobacco Use  . Smoking status: Former Smoker    Packs/day: 1.00    Years: 40.00    Pack years: 40.00    Types: Cigarettes    Last attempt to quit: 1986    Years since quitting: 33.6  . Smokeless tobacco: Never Used  Substance Use Topics  . Alcohol use: No  . Drug use: Not Currently    Review of Systems  Constitutional: No fever/chills Eyes: No visual changes. ENT: No sore throat. Cardiovascular: Denies chest pain. Respiratory: Denies shortness of breath. Gastrointestinal: No abdominal pain.  No nausea, no vomiting.  No diarrhea.  No constipation. Genitourinary: Negative for dysuria. Musculoskeletal: Negative for back pain. Skin: Negative for rash. Neurological: Negative for headaches, focal weakness or numbness.   ____________________________________________   PHYSICAL EXAM:  VITAL SIGNS: ED Triage Vitals  Enc Vitals Group  BP 01/21/18 0931 (!) 101/45     Pulse Rate 01/21/18 0931 63     Resp 01/21/18 0931 20     Temp 01/21/18 0931 (!) 97.5 F (36.4 C)     Temp Source 01/21/18 0931 Oral     SpO2 01/21/18 0931 98 %     Weight 01/21/18 0931 220 lb (99.8 kg)     Height 01/21/18 0931 6' (1.829 m)     Head Circumference --      Peak Flow --      Pain Score 01/21/18 0937 0     Pain Loc --      Pain Edu? --      Excl. in [place]? --     Constitutional: Alert and oriented. Well appearing and in no acute distress. Eyes: Conjunctivae are normal.  Head: Atraumatic. Nose: No  congestion/rhinnorhea. Mouth/Throat: Mucous membranes are moist.  Neck: No stridor.   Cardiovascular: Normal rate, regular rhythm. Grossly normal heart sounds.  Respiratory: Normal respiratory effort.  No retractions. Lungs CTAB. Gastrointestinal: Soft and nontender. No distention.  Grossly bloody hematochezia found on digital rectal exam. Musculoskeletal: No lower extremity tenderness nor edema.  No joint effusions. Neurologic:  Normal speech and language. No gross focal neurologic deficits are appreciated. Skin:  Skin is warm, dry and intact. No rash noted. Psychiatric: Mood and affect are normal. Speech and behavior are normal.  ____________________________________________   LABS (all labs ordered are listed, but only abnormal results are displayed)  Labs Reviewed  BASIC METABOLIC PANEL - Abnormal; Notable for the following components:      Result Value   Glucose, Bld 161 (*)    BUN 41 (*)    Creatinine, Ser 1.44 (*)    Calcium 8.8 (*)    GFR calc non Af Amer 43 (*)    GFR calc Af Amer 50 (*)    All other components within normal limits  CBC - Abnormal; Notable for the following components:   RBC 2.82 (*)    Hemoglobin 7.8 (*)    HCT 23.2 (*)    RDW 17.2 (*)    Platelets 136 (*)    All other components within normal limits  GLUCOSE, CAPILLARY - Abnormal; Notable for the following components:   Glucose-Capillary 157 (*)    All other components within normal limits  PROTIME-INR - Abnormal; Notable for the following components:   Prothrombin Time 36.6 (*)    All other components within normal limits  URINALYSIS, COMPLETE (UACMP) WITH MICROSCOPIC  CBG MONITORING, ED   ____________________________________________  EKG  ED ECG REPORT I, Doran Stabler, the attending physician, personally viewed and interpreted this ECG.   Date: 01/21/2018  EKG Time: 0932  Rate: 62  Rhythm: Ventricular paced rhythm  Axis: Normal  Intervals:Wide-complex consistent with ventricular  pacing  ST&T Change: No ST segment elevation or depression.  No abnormal T wave inversion.  ____________________________________________  [id number]   ____________________________________________   PROCEDURES  Procedure(s) performed:   .Critical Care Performed by: Orbie Pyo, MD Authorized by: Orbie Pyo, MD   Critical care provider statement:    Critical care time (minutes):  35   Critical care time was exclusive of:  Separately billable procedures and treating other patients   Critical care was necessary to treat or prevent imminent or life-threatening deterioration of the following conditions:  Circulatory failure   Critical care was time spent personally by me on the following activities:  Development of treatment plan with patient or  surrogate, discussions with consultants, evaluation of patient's response to treatment, examination of patient, obtaining history from patient or surrogate, ordering and performing treatments and interventions, ordering and review of laboratory studies, ordering and review of radiographic studies, pulse oximetry, re-evaluation of patient's condition and review of old charts    Critical Care performed:   ____________________________________________   INITIAL IMPRESSION / [place] / ED COURSE  Pertinent labs & imaging results that were available during my care of the patient were reviewed by me and considered in my medical decision making (see chart for details).  DDX: Anemia, CHF, hypertension, Coumadin coagulopathy, rectal bleeding As part of my medical decision making, I reviewed the following data within the electronic MEDICAL RECORD NUMBER Notes from prior ED visits  ----------------------------------------- 11:50 AM on 01/21/2018 -----------------------------------------  Patient will have his Coumadin reversed.  Will require admission to the hospital and likely transfusion of blood products.  Signed  out to Dr. Posey Pronto.  Patient aware of diagnosis as well as treatment plan willing to comply. ____________________________________________   FINAL CLINICAL IMPRESSION(S) / ED DIAGNOSES  Elevated INR.  Rectal bleed.  Near syncope.  NEW MEDICATIONS STARTED DURING THIS VISIT:  New Prescriptions   No medications on file     Note:  This document was prepared using Dragon voice recognition software and may include unintentional dictation errors.     Orbie Pyo, MD 01/21/18 [phone]

## 2018-01-22 ENCOUNTER — Encounter
Admission: EM | Disposition: A | Discharge status: Other Acute Inpatient Hospital | Payer: Self-pay | Source: Home / Self Care | Attending: Internal Medicine

## 2018-01-22 ENCOUNTER — Inpatient Hospital Stay: Payer: Medicare Other | Admitting: Anesthesiology

## 2018-01-22 ENCOUNTER — Encounter: Payer: Self-pay | Admitting: *Deleted

## 2018-01-22 DIAGNOSIS — K2970 Gastritis, unspecified, without bleeding: Secondary | ICD-10-CM

## 2018-01-22 DIAGNOSIS — Z9289 Personal history of other medical treatment: Secondary | ICD-10-CM

## 2018-01-22 DIAGNOSIS — K921 Melena: Secondary | ICD-10-CM

## 2018-01-22 HISTORY — PX: ESOPHAGOGASTRODUODENOSCOPY (EGD) WITH PROPOFOL: SHX5813

## 2018-01-22 HISTORY — DX: Personal history of other medical treatment: Z92.89

## 2018-01-22 LAB — CBC
HEMATOCRIT: 26 % — AB (ref 40.0–52.0)
HEMOGLOBIN: 9 g/dL — AB (ref 13.0–18.0)
MCH: 28.6 pg (ref 26.0–34.0)
MCHC: 34.4 g/dL (ref 32.0–36.0)
MCV: 83.2 fL (ref 80.0–100.0)
Platelets: 130 10*3/uL — ABNORMAL LOW (ref 150–440)
RBC: 3.13 MIL/uL — ABNORMAL LOW (ref 4.40–5.90)
RDW: 16.6 % — ABNORMAL HIGH (ref 11.5–14.5)
WBC: 5.6 10*3/uL (ref 3.8–10.6)

## 2018-01-22 LAB — TYPE AND SCREEN
ABO/RH(D): O NEG
Antibody Screen: NEGATIVE
UNIT DIVISION: 0

## 2018-01-22 LAB — BPAM RBC
Blood Product Expiration Date: 201909052359
ISSUE DATE / TIME: 201908251520
Unit Type and Rh: 9500

## 2018-01-22 LAB — BASIC METABOLIC PANEL
ANION GAP: 4 — AB (ref 5–15)
BUN: 33 mg/dL — ABNORMAL HIGH (ref 8–23)
CHLORIDE: 112 mmol/L — AB (ref 98–111)
CO2: 23 mmol/L (ref 22–32)
Calcium: 8.3 mg/dL — ABNORMAL LOW (ref 8.9–10.3)
Creatinine, Ser: 1.31 mg/dL — ABNORMAL HIGH (ref 0.61–1.24)
GFR calc non Af Amer: 49 mL/min — ABNORMAL LOW (ref >60)
GFR, EST AFRICAN AMERICAN: 56 mL/min — AB (ref >60)
Glucose, Bld: 106 mg/dL — ABNORMAL HIGH (ref 70–99)
Potassium: 4 mmol/L (ref 3.5–5.1)
Sodium: 139 mmol/L (ref 135–145)

## 2018-01-22 LAB — GLUCOSE, CAPILLARY
GLUCOSE-CAPILLARY: 105 mg/dL — AB (ref 70–99)
GLUCOSE-CAPILLARY: 123 mg/dL — AB (ref 70–99)
Glucose-Capillary: 103 mg/dL — ABNORMAL HIGH (ref 70–99)
Glucose-Capillary: 281 mg/dL — ABNORMAL HIGH (ref 70–99)

## 2018-01-22 LAB — PROTIME-INR
INR: 1.32
INR: 1.27
INR: 1.25
Prothrombin Time: 16.3 seconds — ABNORMAL HIGH (ref 11.4–15.2)
Prothrombin Time: 15.8 seconds — ABNORMAL HIGH (ref 11.4–15.2)
Prothrombin Time: 15.6 seconds — ABNORMAL HIGH (ref 11.4–15.2)

## 2018-01-22 LAB — HEMOGLOBIN: HEMOGLOBIN: 9.1 g/dL — AB (ref 13.0–18.0)

## 2018-01-22 SURGERY — EGD (ESOPHAGOGASTRODUODENOSCOPY)
Anesthesia: Monitor Anesthesia Care | Laterality: Left

## 2018-01-22 SURGERY — ESOPHAGOGASTRODUODENOSCOPY (EGD) WITH PROPOFOL
Anesthesia: General

## 2018-01-22 MED ORDER — PHENYLEPHRINE HCL 10 MG/ML IJ SOLN
INTRAMUSCULAR | Status: DC | PRN
  Administered 2018-01-22 (×2): 100 ug via INTRAVENOUS

## 2018-01-22 MED ORDER — LIDOCAINE HCL (CARDIAC) PF 100 MG/5ML IV SOSY
PREFILLED_SYRINGE | INTRAVENOUS | Status: DC | PRN
  Administered 2018-01-22: 60 mg via INTRAVENOUS

## 2018-01-22 MED ORDER — ROSUVASTATIN CALCIUM 10 MG PO TABS
10.0000 mg | ORAL_TABLET | Freq: Every day | ORAL | Status: DC
  Administered 2018-01-22 – 2018-01-24 (×2): 10 mg via ORAL
  Filled 2018-01-22 (×2): qty 1

## 2018-01-22 MED ORDER — PANTOPRAZOLE SODIUM 40 MG PO TBEC
40.0000 mg | DELAYED_RELEASE_TABLET | Freq: Two times a day (BID) | ORAL | Status: DC
  Administered 2018-01-22 – 2018-01-25 (×4): 40 mg via ORAL
  Filled 2018-01-22 (×6): qty 1

## 2018-01-22 MED ORDER — PROPOFOL 10 MG/ML IV BOLUS
INTRAVENOUS | Status: DC | PRN
  Administered 2018-01-22: 70 mg via INTRAVENOUS

## 2018-01-22 MED ORDER — POLYETHYLENE GLYCOL 3350 17 GM/SCOOP PO POWD
1.0000 | Freq: Once | ORAL | Status: AC
  Administered 2018-01-22: 255 g via ORAL
  Filled 2018-01-22 (×2): qty 255

## 2018-01-22 MED ORDER — DONEPEZIL HCL 5 MG PO TABS
10.0000 mg | ORAL_TABLET | Freq: Every day | ORAL | Status: DC
  Administered 2018-01-22 – 2018-01-24 (×2): 10 mg via ORAL
  Filled 2018-01-22 (×4): qty 2

## 2018-01-22 NOTE — Transfer of Care (Signed)
Immediate Anesthesia Transfer of Care Note  Patient: Luis Palmer  Procedure(s) Performed: ESOPHAGOGASTRODUODENOSCOPY (EGD) WITH PROPOFOL (N/A )  Patient Location: Endoscopy Unit  Anesthesia Type:General  Level of Consciousness: awake, alert , oriented and patient cooperative  Airway & Oxygen Therapy: Patient Spontanous Breathing and Patient connected to nasal cannula oxygen  Post-op Assessment: Report given to RN, Post -op Vital signs reviewed and stable and Patient moving all extremities  Post vital signs: Reviewed and stable  Last Vitals:  Vitals Value Taken Time  BP 98/56 01/22/2018  2:19 PM  Temp    Pulse 70 01/22/2018  2:26 PM  Resp 22 01/22/2018  2:26 PM  SpO2 100 % 01/22/2018  2:26 PM  Vitals shown include unvalidated device data.  Last Pain:  Vitals:   01/22/18 1300  TempSrc:   PainSc: 0-No pain         Complications: No apparent anesthesia complications

## 2018-01-22 NOTE — Anesthesia Postprocedure Evaluation (Signed)
Anesthesia Post Note  Patient: Luis Palmer  Procedure(s) Performed: ESOPHAGOGASTRODUODENOSCOPY (EGD) WITH PROPOFOL (N/A )  Patient location during evaluation: Endoscopy Anesthesia Type: General Level of consciousness: awake and alert Pain management: pain level controlled Vital Signs Assessment: post-procedure vital signs reviewed and stable Respiratory status: spontaneous breathing and respiratory function stable Cardiovascular status: stable Anesthetic complications: no     Last Vitals:  Vitals:   01/22/18 1300 01/22/18 1420  BP: (!) 128/53   Pulse: 65   Resp: 18   Temp: 36.9 C (!) 36.1 C  SpO2: 95%     Last Pain:  Vitals:   01/22/18 1420  TempSrc: Tympanic  PainSc:                  [place] Gotwalt K

## 2018-01-22 NOTE — OR Nursing (Signed)
Report to [place] on floor. Pt s/p EGD. WILL BE npo AFTERMIDNIGHT for colonoscopy. rn I/s to assure pt completes prep and obtain consent. Understanding voiced. Transported back to floor

## 2018-01-22 NOTE — Anesthesia Post-op Follow-up Note (Signed)
Anesthesia QCDR form completed.        

## 2018-01-22 NOTE — Anesthesia Preprocedure Evaluation (Addendum)
Anesthesia Evaluation  Patient identified by MRN, date of birth, ID band Patient awake    Reviewed: Allergy & Precautions, NPO status , Patient's Chart, lab work & pertinent test results  History of Anesthesia Complications Negative for: history of anesthetic complications  Airway Mallampati: II       Dental   Pulmonary neg sleep apnea, neg COPD, former smoker,           Cardiovascular (-) hypertension+CHF  (-) Past MI + dysrhythmias Atrial Fibrillation + pacemaker + Cardiac Defibrillator      Neuro/Psych neg Seizures    GI/Hepatic Neg liver ROS, neg GERD  ,  Endo/Other  diabetes, Type 2, Oral Hypoglycemic Agents  Renal/GU negative Renal ROS     Musculoskeletal   Abdominal   Peds  Hematology  (+) anemia ,   Anesthesia Other Findings   Reproductive/Obstetrics                             Anesthesia Physical Anesthesia Plan  ASA: III  Anesthesia Plan: General   Post-op Pain Management:    Induction:   PONV Risk Score and Plan: 2  Airway Management Planned: Nasal Cannula  Additional Equipment:   Intra-op Plan:   Post-operative Plan:   Informed Consent: I have reviewed the patients History and Physical, chart, labs and discussed the procedure including the risks, benefits and alternatives for the proposed anesthesia with the patient or authorized representative who has indicated his/her understanding and acceptance.     Plan Discussed with:   Anesthesia Plan Comments:        Anesthesia Quick Evaluation

## 2018-01-22 NOTE — Progress Notes (Signed)
Per MD okay for RN to place order for clear liquids.

## 2018-01-22 NOTE — Op Note (Signed)
[facility] Patient Name: Luis Palmer Procedure Date: 01/22/2018 1:54 PM MRN: [id number] Account #: [id number] Date of Birth: 06-Jan-1933 Admit Type: Inpatient Age: 84 Room: [facility] ENDO ROOM 4 Gender: Male Note Status: Finalized Procedure:            Upper GI endoscopy Indications:          Melena Providers:            Lucilla Lame MD, MD Referring MD:         Leonie Douglas. Doy Hutching, MD (Referring MD) Medicines:            Propofol per Anesthesia Complications:        No immediate complications. Procedure:            Pre-Anesthesia Assessment:                       - Prior to the procedure, a History and Physical was                        performed, and patient medications and allergies were                        reviewed. The patient's tolerance of previous                        anesthesia was also reviewed. The risks and benefits of                        the procedure and the sedation options and risks were                        discussed with the patient. All questions were                        answered, and informed consent was obtained. Prior                        Anticoagulants: The patient has taken no previous                        anticoagulant or antiplatelet agents. ASA Grade                        Assessment: II - A patient with mild systemic disease.                        After reviewing the risks and benefits, the patient was                        deemed in satisfactory condition to undergo the                        procedure.                       After obtaining informed consent, the endoscope was                        passed under direct vision. Throughout the procedure,  the patient's blood pressure, pulse, and oxygen                        saturations were monitored continuously. The Endoscope                        was introduced through the mouth, and advanced to the                        second  part of duodenum. The upper GI endoscopy was                        accomplished without difficulty. The patient tolerated                        the procedure well. Findings:      The examined esophagus was normal.      Localized moderate inflammation characterized by erosions and erythema       was found in the gastric antrum.      The examined duodenum was normal. Impression:           - Normal esophagus.                       - Gastritis.                       - Normal examined duodenum.                       - No specimens collected. Recommendation:       - Return patient to hospital ward for ongoing care.                       - Advance diet as tolerated.                       - Continue present medications. Procedure Code(s):    --- Professional ---                       [phone], Esophagogastroduodenoscopy, flexible, transoral;                        diagnostic, including collection of specimen(s) by                        brushing or washing, when performed (separate procedure) Diagnosis Code(s):    --- Professional ---                       K92.1, Melena (includes Hematochezia)                       K29.70, Gastritis, unspecified, without bleeding CPT copyright 2017 American Medical Association. All rights reserved. The codes documented in this report are preliminary and upon coder review may  be revised to meet current compliance requirements. Lucilla Lame MD, MD 01/22/2018 2:10:51 PM This report has been signed electronically. Number of Addenda: 0 Note Initiated On: 01/22/2018 1:54 PM      [facility]

## 2018-01-22 NOTE — Progress Notes (Signed)
[place] [facility] [place] NAME: Luis Palmer    MR#:  [id number]  DATE OF BIRTH:  1932/08/12  SUBJECTIVE:  CHIEF COMPLAINT: Dizziness is better no other bowel movements since admission.  Had EGD today and up for colonoscopy tomorrow  REVIEW OF SYSTEMS:  CONSTITUTIONAL: No fever, fatigue or weakness.  EYES: No blurred or double vision.  EARS, NOSE, AND THROAT: No tinnitus or ear pain.  RESPIRATORY: No cough, shortness of breath, wheezing or hemoptysis.  CARDIOVASCULAR: No chest pain, orthopnea, edema.  GASTROINTESTINAL: No nausea, vomiting, diarrhea or abdominal pain.  GENITOURINARY: No dysuria, hematuria.  ENDOCRINE: No polyuria, nocturia,  HEMATOLOGY: No anemia, easy bruising or bleeding SKIN: No rash or lesion. MUSCULOSKELETAL: No joint pain or arthritis.   NEUROLOGIC: No tingling, numbness, weakness.  PSYCHIATRY: No anxiety or depression.   DRUG ALLERGIES:   Allergies  Allergen Reactions  . Pseudoephedrine Hcl Other (See Comments)    Other Reaction: ANURIA  . Ace Inhibitors Cough and Other (See Comments)    cough cough   . Other Other (See Comments)    Other Reaction: fluid retention Other Reaction: fluid retention  . Nsaids Other (See Comments)    Reaction:  Fluid retention   . Sudafed [Pseudoephedrine Hcl] Other (See Comments)    Pt states that it messes up his prostate.    . Vasotec [Enalapril] Cough    VITALS:  Blood pressure (!) 128/57, pulse 73, temperature 98 F (36.7 C), temperature source Oral, resp. rate (!) 22, height 6' (1.829 m), weight 99.8 kg, SpO2 91 %.  PHYSICAL EXAMINATION:  GENERAL:  84 y.o.-year-old patient lying in the bed with no acute distress.  EYES: Pupils equal, round, reactive to light and accommodation. No scleral icterus. Extraocular muscles intact.  HEENT: Head atraumatic, normocephalic. Oropharynx and nasopharynx clear.  NECK:  Supple, no jugular venous distention. No thyroid  enlargement, no tenderness.  LUNGS: Normal breath sounds bilaterally, no wheezing, rales,rhonchi or crepitation. No use of accessory muscles of respiration.  CARDIOVASCULAR: S1, S2 normal. No murmurs, rubs, or gallops.  ABDOMEN: Soft, nontender, nondistended. Bowel sounds present. No organomegaly or mass.  EXTREMITIES: No pedal edema, cyanosis, or clubbing.  NEUROLOGIC: Cranial nerves II through XII are intact. Muscle strength 5/5 in all extremities. Sensation intact. Gait not checked.  PSYCHIATRIC: The patient is alert and oriented x 3.  SKIN: No obvious rash, lesion, or ulcer.    LABORATORY PANEL:   CBC Recent Labs  Lab 01/22/18 0307 01/22/18 1229  WBC 5.6  --   HGB 9.0* 9.1*  HCT 26.0*  --   PLT 130*  --    ------------------------------------------------------------------------------------------------------------------  Chemistries  Recent Labs  Lab 01/22/18 0307  NA 139  K 4.0  CL 112*  CO2 23  GLUCOSE 106*  BUN 33*  CREATININE 1.31*  CALCIUM 8.3*   ------------------------------------------------------------------------------------------------------------------  Cardiac Enzymes No results for input(s): TROPONINI in the last 168 hours. ------------------------------------------------------------------------------------------------------------------  RADIOLOGY:  No results found.  EKG:   Orders placed or performed during the hospital encounter of 01/21/18  . ED EKG  . ED EKG  . EKG    ASSESSMENT AND PLAN:   Patient is 84 year old with history of coronary artery disease atrial fibrillation on chronic Coumadin with bright red blood per rectum  1.  Acute lower GI bleed likely diverticular in nature Patient's hemoglobin is likely lower than what it showing currently Hemoglobin 7.8-7.7-1 unit of PRBC-9.0-9.1 Seen by gastroenterology EGD done January 22, 2018  has revealed gastritis and normal esophagus Plan is to advance diet as tolerated and monitor  hemoglobin hematocrit closely Patient if he rebleeds will need a bleeding scan He has received vitamin K and be receiving Kcentra for his elevated INR hold Coumadin for now  2.  Hypotension blood pressure currently improved give IV fluids monitor blood pressure hold antihypertensive medication  3.  Hyperlipidemia continue Zetia  4.  Diabetes type 2 Hold Metformin Place on sliding scale insulin for now  5.   Chronic atrial fibrillation rate controlled Coumadin on hold in view of GI bleed   Miscellaneous SCDs for DVT prophylaxis       All the records are reviewed and case discussed with Care Management/Social Workerr. Management plans discussed with the patient, family and they are in agreement.  CODE STATUS:   TOTAL TIME TAKING CARE OF THIS PATIENT: 61minutes.   POSSIBLE D/C IN 1-2 DAYS, DEPENDING ON CLINICAL CONDITION.  Note: This dictation was prepared with Dragon dictation along with smaller phrase technology. Any transcriptional errors that result from this process are unintentional.   Nicholes Mango M.D on 01/22/2018 at 3:12 PM  Between 7am to 6pm - Pager - [phone] After 6pm go to [email or web address] - password [email or web address] [facility]  Office  [phone]  CC: Primary care physician; Idelle Crouch, MD

## 2018-01-23 ENCOUNTER — Encounter
Admission: EM | Disposition: A | Discharge status: Other Acute Inpatient Hospital | Payer: Self-pay | Source: Home / Self Care | Attending: Internal Medicine

## 2018-01-23 ENCOUNTER — Inpatient Hospital Stay: Payer: Medicare Other

## 2018-01-23 ENCOUNTER — Inpatient Hospital Stay: Payer: Medicare Other | Admitting: Anesthesiology

## 2018-01-23 ENCOUNTER — Other Ambulatory Visit: Payer: Self-pay

## 2018-01-23 ENCOUNTER — Encounter: Payer: Self-pay | Admitting: Anesthesiology

## 2018-01-23 DIAGNOSIS — D490 Neoplasm of unspecified behavior of digestive system: Secondary | ICD-10-CM

## 2018-01-23 DIAGNOSIS — D62 Acute posthemorrhagic anemia: Secondary | ICD-10-CM

## 2018-01-23 DIAGNOSIS — D123 Benign neoplasm of transverse colon: Secondary | ICD-10-CM

## 2018-01-23 HISTORY — PX: COLONOSCOPY WITH PROPOFOL: SHX5780

## 2018-01-23 LAB — BASIC METABOLIC PANEL
Anion gap: 8 (ref 5–15)
BUN: 30 mg/dL — AB (ref 8–23)
CALCIUM: 8.1 mg/dL — AB (ref 8.9–10.3)
CO2: 18 mmol/L — AB (ref 22–32)
CREATININE: 1.53 mg/dL — AB (ref 0.61–1.24)
Chloride: 112 mmol/L — ABNORMAL HIGH (ref 98–111)
GFR calc Af Amer: 47 mL/min — ABNORMAL LOW (ref >60)
GFR, EST NON AFRICAN AMERICAN: 40 mL/min — AB (ref >60)
GLUCOSE: 205 mg/dL — AB (ref 70–99)
Potassium: 4.1 mmol/L (ref 3.5–5.1)
Sodium: 138 mmol/L (ref 135–145)

## 2018-01-23 LAB — PROTIME-INR
INR: 1.43
Prothrombin Time: 17.3 seconds — ABNORMAL HIGH (ref 11.4–15.2)

## 2018-01-23 LAB — GLUCOSE, CAPILLARY
Glucose-Capillary: 142 mg/dL — ABNORMAL HIGH (ref 70–99)
Glucose-Capillary: 166 mg/dL — ABNORMAL HIGH (ref 70–99)
Glucose-Capillary: 139 mg/dL — ABNORMAL HIGH (ref 70–99)

## 2018-01-23 LAB — CBC
HCT: 26.5 % — ABNORMAL LOW (ref 40.0–52.0)
Hemoglobin: 8.9 g/dL — ABNORMAL LOW (ref 13.0–18.0)
MCH: 28.4 pg (ref 26.0–34.0)
MCHC: 33.8 g/dL (ref 32.0–36.0)
MCV: 84 fL (ref 80.0–100.0)
PLATELETS: 129 10*3/uL — AB (ref 150–440)
RBC: 3.15 MIL/uL — ABNORMAL LOW (ref 4.40–5.90)
RDW: 17.1 % — AB (ref 11.5–14.5)
WBC: 8.5 10*3/uL (ref 3.8–10.6)

## 2018-01-23 SURGERY — COLONOSCOPY WITH PROPOFOL
Anesthesia: General

## 2018-01-23 MED ORDER — SODIUM CHLORIDE 0.9 % IV SOLN: INTRAVENOUS | Status: DC

## 2018-01-23 MED ORDER — LIDOCAINE 2% (20 MG/ML) 5 ML SYRINGE
INTRAMUSCULAR | Status: DC | PRN
  Administered 2018-01-23: 30 mg via INTRAVENOUS

## 2018-01-23 MED ORDER — IOHEXOL 300 MG/ML  SOLN
75.0000 mL | Freq: Once | INTRAMUSCULAR | Status: AC | PRN
  Administered 2018-01-23: 75 mL via INTRAVENOUS

## 2018-01-23 MED ORDER — EPHEDRINE SULFATE 50 MG/ML IJ SOLN
INTRAMUSCULAR | Status: DC | PRN
  Administered 2018-01-23: 10 mg via INTRAVENOUS

## 2018-01-23 MED ORDER — PROPOFOL 10 MG/ML IV BOLUS
INTRAVENOUS | Status: DC | PRN
  Administered 2018-01-23: 100 mg via INTRAVENOUS

## 2018-01-23 MED ORDER — FENTANYL CITRATE (PF) 100 MCG/2ML IJ SOLN
INTRAMUSCULAR | Status: AC
  Filled 2018-01-23: qty 2

## 2018-01-23 MED ORDER — PROPOFOL 500 MG/50ML IV EMUL
INTRAVENOUS | Status: AC
  Filled 2018-01-23: qty 50

## 2018-01-23 MED ORDER — IOPAMIDOL (ISOVUE-300) INJECTION 61%
15.0000 mL | INTRAVENOUS | Status: AC
  Administered 2018-01-23 (×2): 15 mL via ORAL

## 2018-01-23 MED ORDER — SODIUM CHLORIDE 0.9 % IV SOLN
INTRAVENOUS | Status: DC
  Administered 2018-01-23 – 2018-01-24 (×3): via INTRAVENOUS

## 2018-01-23 MED ORDER — PROPOFOL 500 MG/50ML IV EMUL
INTRAVENOUS | Status: DC | PRN
  Administered 2018-01-23: 160 ug/kg/min via INTRAVENOUS

## 2018-01-23 NOTE — Anesthesia Preprocedure Evaluation (Addendum)
Anesthesia Evaluation  Patient identified by MRN, date of birth, ID band Patient awake    Reviewed: Allergy & Precautions, NPO status , Patient's Chart, lab work & pertinent test results, reviewed documented beta blocker date and time   Airway Mallampati: II  TM Distance: >3 FB     Dental  (+) Chipped   Pulmonary former smoker,           Cardiovascular + CABG and +CHF  + dysrhythmias Atrial Fibrillation + pacemaker + Cardiac Defibrillator      Neuro/Psych    GI/Hepatic   Endo/Other  diabetes, Type 2  Renal/GU      Musculoskeletal   Abdominal   Peds  Hematology  (+) anemia ,   Anesthesia Other Findings   Reproductive/Obstetrics                            Anesthesia Physical Anesthesia Plan  ASA: III  Anesthesia Plan: General   Post-op Pain Management:    Induction: Intravenous  PONV Risk Score and Plan:   Airway Management Planned:   Additional Equipment:   Intra-op Plan:   Post-operative Plan:   Informed Consent: I have reviewed the patients History and Physical, chart, labs and discussed the procedure including the risks, benefits and alternatives for the proposed anesthesia with the patient or authorized representative who has indicated his/her understanding and acceptance.     Plan Discussed with: CRNA  Anesthesia Plan Comments:         Anesthesia Quick Evaluation

## 2018-01-23 NOTE — Transfer of Care (Signed)
Immediate Anesthesia Transfer of Care Note  Patient: HANIEL FIX  Procedure(s) Performed: COLONOSCOPY WITH PROPOFOL (N/A )  Patient Location: PACU and Endoscopy Unit  Anesthesia Type:General  Level of Consciousness: drowsy  Airway & Oxygen Therapy: Patient Spontanous Breathing and Patient connected to nasal cannula oxygen  Post-op Assessment: Report given to RN and Post -op Vital signs reviewed and stable  Post vital signs: Reviewed and stable  Last Vitals:  Vitals Value Taken Time  BP 101/53 01/23/2018 12:00 PM  Temp 36.1 C 01/23/2018 12:00 PM  Pulse 59 01/23/2018 12:06 PM  Resp 16 01/23/2018 12:06 PM  SpO2 98 % 01/23/2018 12:06 PM    Last Pain:  Vitals:   01/23/18 1200  TempSrc: Tympanic  PainSc:          Complications: No apparent anesthesia complications

## 2018-01-23 NOTE — Anesthesia Post-op Follow-up Note (Signed)
Anesthesia QCDR form completed.        

## 2018-01-23 NOTE — Consult Note (Signed)
Subjective:   CC: transverse colon mass  HPI:  Luis Palmer is a 84 y.o. male who was consulted by Nicholes Mango for issue above.  Symptoms were first noted some time ago, complaining of generalized weakness and dizziness.  Presented for ED and noted to have Bright red blood per rectum as well as dark stools.  Lab work also showed anemia and supratherapeutic INR, therefore he was admitted for further work-up.  EGD only showed gastritis but the colonoscopy today showed a obstructing transverse colon mass therefore surgery was consulted.  He received 1 unit of transfusion prior to the procedures.  Patient currently does not have any complaints is asymptomatic and is tolerating a regular diet he has not had any further bowel movement since the prep last night.    Past Medical History:  has a past medical history of Anemia, Atrial fibrillation ([place]), CHF (congestive heart failure) ([place]), Diabetes mellitus without complication ([place]), and CABG.  Past Surgical History:  Past Surgical History:  Procedure Laterality Date  . ABLATION     X 2 FOR A-FIB  . APPENDECTOMY    . CARDIAC DEFIBRILLATOR PLACEMENT    . CARDIAC SURGERY    . CORONARY ARTERY BYPASS GRAFT    . INSERT / REPLACE / REMOVE PACEMAKER    . PACEMAKER IMPLANT      Family History: family history includes Congestive Heart Failure in his mother; Diabetes in his father; Lung cancer in his brother; Stroke in his father.  Social History:  reports that he quit smoking about 33 years ago. His smoking use included cigarettes. He has a 40.00 pack-year smoking history. He has never used smokeless tobacco. He reports that he has current or past drug history. He reports that he does not drink alcohol.  Current Medications:  Medications Prior to Admission  Medication Sig Dispense Refill  . carvedilol (COREG) 12.5 MG tablet Take 12.5 mg by mouth 2 (two) times daily.    . Cholecalciferol (VITAMIN D) 2000 UNITS CAPS Take 1 capsule by mouth daily.     Marland Kitchen COUMADIN 5 MG tablet Take 2.5-5 mg by mouth daily. Take 5 mg by mouth 4 days a week. Take 2.5 mg by mouth 3 days a week.    . donepezil (ARICEPT) 10 MG tablet Take 10 mg by mouth at bedtime.    Marland Kitchen ezetimibe (ZETIA) 10 MG tablet Take 10 mg by mouth at bedtime.    . furosemide (LASIX) 40 MG tablet Take 40 mg by mouth daily.    Marland Kitchen losartan (COZAAR) 25 MG tablet Take 25 mg by mouth daily.    . meclizine (ANTIVERT) 25 MG tablet Take 25 mg by mouth daily.     . metFORMIN (GLUCOPHAGE) 500 MG tablet Take 500 mg by mouth 2 (two) times daily.    . rosuvastatin (CRESTOR) 10 MG tablet Take 10 mg by mouth daily.      Allergies:  Allergies as of 01/21/2018 - Review Complete 01/21/2018  Allergen Reaction Noted  . Pseudoephedrine hcl Other (See Comments) 10/02/2017  . Ace inhibitors Cough and Other (See Comments) 11/21/2011  . Other Other (See Comments) 11/21/2011  . Nsaids Other (See Comments) 12/29/2014  . Sudafed [pseudoephedrine hcl] Other (See Comments) 12/29/2014  . Vasotec [enalapril] Cough 12/29/2014    ROS:  A 15 point review of systems was performed and pertinent positives and negatives noted in HPI   Objective:     BP (!) 98/55 (BP Location: Right Arm) Comment: nurse notified  Pulse Marland Kitchen)  59   Temp 97.8 F (36.6 C) (Oral)   Resp 18   Ht 6' (1.829 m)   Wt 99.8 kg   SpO2 94%   BMI 29.84 kg/m   Constitutional :  alert, cooperative, appears stated age and no distress  Lymphatics/Throat:  no asymmetry, masses, or scars  Respiratory:  clear to auscultation bilaterally  Cardiovascular:  regular rate and rhythm  Gastrointestinal: soft, non-tender; bowel sounds normal; no masses,  no organomegaly.   Musculoskeletal: Steady gait and movement  Skin: Cool and moist, sternotomy surgical scars   Psychiatric: Normal affect, non-agitated, not confused       LABS:  CMP Latest Ref Rng & Units 01/23/2018 01/22/2018 01/21/2018  Glucose 70 - 99 mg/dL 205(H) 106(H) 161(H)  BUN 8 - 23 mg/dL 30(H)  33(H) 41(H)  Creatinine 0.61 - 1.24 mg/dL 1.53(H) 1.31(H) 1.44(H)  Sodium 135 - 145 mmol/L 138 139 137  Potassium 3.5 - 5.1 mmol/L 4.1 4.0 4.2  Chloride 98 - 111 mmol/L 112(H) 112(H) 106  CO2 22 - 32 mmol/L 18(L) 23 23  Calcium 8.9 - 10.3 mg/dL 8.1(L) 8.3(L) 8.8(L)  Total Protein 6.4 - 8.2 g/dL - - -  Total Bilirubin 0.2 - 1.0 mg/dL - - -  Alkaline Phos Unit/L - - -  AST 15 - 37 Unit/L - - -  ALT 12 - 78 U/L - - -   CBC Latest Ref Rng & Units 01/23/2018 01/22/2018 01/22/2018  WBC 3.8 - 10.6 K/uL 8.5 - 5.6  Hemoglobin 13.0 - 18.0 g/dL 8.9(L) 9.1(L) 9.0(L)  Hematocrit 40.0 - 52.0 % 26.5(L) - 26.0(L)  Platelets 150 - 440 K/uL 129(L) - 130(L)   Pending CEA  RADS: Pending CT c/p/a Assessment:   Transverse colon mass noted on colonoscopy.  Unable to advance scope beyond per report.  No evidence of active bleeding.  Polyp removed in distal portion as well.  Path pending but likely going to be malignant  Afib on coumadin, s/p reversal.  INR today at 1.43 CHF Hx of CABG DM  Plan:    Discussed pathophisiology of colon CA in depth.  Although biopsy hasnt confirmed and we have not completed pre-op assessment, we discussed briefly the possible surgical intervention needed.  The risk of laparoscopic colon resection surgery includes, but not limited to, recurrence, bleeding, chronic pain, post-op infxn, post-op SBO or ileus, hernias, resection of bowel, re-anastamosis, possible ostomy placement and need for re-operation to address said risks. The risks of general anesthetic, if used, includes MI, CVA, sudden death or even reaction to anesthetic medications also discussed. Alternatives include continued observation.  Benefits include possible symptom relief, preventing further decline in health and possible death.   Typical post-op recovery time of additional days in hospital for observation afterwards also discussed.  Patient verbalized understanding.  We will begin with CT chest abdomen and  pelvis for metastatic work-up as well as to obtain a CEA level.  His physical exam and review of systems at this point does not indicate any possibility of metastasis.  In the meantime we will also obtain medical and cardiac clearance and additional recommendations to optimize patient to undergo possible surgery.  He is a high risk candidate due to his advanced age as well as his multiple comorbidities.  Plan discussed with consulting hospitalist and she is in agreement as well.  We will continue to follow

## 2018-01-23 NOTE — Consult Note (Signed)
[facility] Cardiology  CARDIOLOGY CONSULT NOTE  Patient ID: Luis Palmer MRN: [id number] DOB/AGE: [birth year] 84 y.o.  Admit date: 01/21/2018 Referring Physician [facility] Primary Physician [facility] Primary Cardiologist [facility] Reason for Consultation Preoperative cardiovascular evaluation  HPI: 84 year old male referred for preoperative cardiovascular evaluation prior to laparoscopic colon resection.  The patient has a history of coronary artery disease, status post CABG in 1986, atrial flutter status post cardioversions in 2011 and 2016, status post ablation in 2010 in 2012, persistent atrial fibrillation on warfarin, ischemic cardiomyopathy with LVEF 83%, chronic systolic CHF, anemia, and status post dual-chamber ICD implantation in 2006 followed by BiV ICD upgrade in 2014. The patient presented to [facility] ER yesterday for generalized weakness and bright red blood per rectum. Colonoscopy revealed obstructing transverse colon mass, and laparoscopic colon resection was recommended. From a cardiovascular perspective, the patient has been doing fairly well. He denies chest pain, worsening shortness of breath, palpitations, or peripheral edema. ECG revealed ventricular paced rhythm at a rate of 62 bpm.    Review of systems complete and found to be negative unless listed above     Past Medical History:  Diagnosis Date  . Anemia   . Atrial fibrillation ([place])   . CHF (congestive heart failure) ([place])   . Diabetes mellitus without complication ([place])   . Hx of CABG     Past Surgical History:  Procedure Laterality Date  . ABLATION     X 2 FOR A-FIB  . APPENDECTOMY    . CARDIAC DEFIBRILLATOR PLACEMENT    . CARDIAC SURGERY    . CORONARY ARTERY BYPASS GRAFT    . ESOPHAGOGASTRODUODENOSCOPY (EGD) WITH PROPOFOL N/A 01/22/2018   Procedure: ESOPHAGOGASTRODUODENOSCOPY (EGD) WITH PROPOFOL;  Surgeon: Lucilla Lame, MD;  Location: [facility] ENDOSCOPY;  Service: Endoscopy;  Laterality: N/A;  . INSERT / REPLACE / REMOVE  PACEMAKER    . PACEMAKER IMPLANT      Medications Prior to Admission  Medication Sig Dispense Refill Last Dose  . carvedilol (COREG) 12.5 MG tablet Take 12.5 mg by mouth 2 (two) times daily.   01/21/2018 at 0700  . Cholecalciferol (VITAMIN D) 2000 UNITS CAPS Take 1 capsule by mouth daily.   01/20/2018 at 1900  . COUMADIN 5 MG tablet Take 2.5-5 mg by mouth daily. Take 5 mg by mouth 4 days a week. Take 2.5 mg by mouth 3 days a week.   01/21/2018 at 1900  . donepezil (ARICEPT) 10 MG tablet Take 10 mg by mouth at bedtime.   01/20/2018 at 1900  . ezetimibe (ZETIA) 10 MG tablet Take 10 mg by mouth at bedtime.   01/20/2018 at 1900  . furosemide (LASIX) 40 MG tablet Take 40 mg by mouth daily.   01/21/2018 at 0700  . losartan (COZAAR) 25 MG tablet Take 25 mg by mouth daily.   01/21/2018 at 0700  . meclizine (ANTIVERT) 25 MG tablet Take 25 mg by mouth daily.    01/21/2018 at 0700  . metFORMIN (GLUCOPHAGE) 500 MG tablet Take 500 mg by mouth 2 (two) times daily.   01/21/2018 at 0700  . rosuvastatin (CRESTOR) 10 MG tablet Take 10 mg by mouth daily.   01/21/2018 at 0700   Social History   Socioeconomic History  . Marital status: Married    Spouse name: Lelon Frohlich  . Number of children: 4  . Years of education: Not on file  . Highest education level: Not on file  Occupational History  . Not on file  Social Needs  . Financial  resource strain: Not on file  . Food insecurity:    Worry: Not on file    Inability: Not on file  . Transportation needs:    Medical: Not on file    Non-medical: Not on file  Tobacco Use  . Smoking status: Former Smoker    Packs/day: 1.00    Years: 40.00    Pack years: 40.00    Types: Cigarettes    Last attempt to quit: 1986    Years since quitting: 33.6  . Smokeless tobacco: Never Used  Substance and Sexual Activity  . Alcohol use: No  . Drug use: Not Currently  . Sexual activity: Not on file  Lifestyle  . Physical activity:    Days per week: Not on file    Minutes per  session: Not on file  . Stress: Not on file  Relationships  . Social connections:    Talks on phone: Not on file    Gets together: Not on file    Attends religious service: Not on file    Active member of club or organization: Not on file    Attends meetings of clubs or organizations: Not on file    Relationship status: Not on file  . Intimate partner violence:    Fear of current or ex partner: Not on file    Emotionally abused: Not on file    Physically abused: Not on file    Forced sexual activity: Not on file  Other Topics Concern  . Not on file  Social History Narrative  . Not on file    Family History  Problem Relation Age of Onset  . Congestive Heart Failure Mother   . Diabetes Father   . Stroke Father   . Lung cancer Brother       Review of systems complete and found to be negative unless listed above      PHYSICAL EXAM  General: Well developed, well nourished, in no acute distress, resting in bed HEENT:  Normocephalic and atramatic Neck:  No JVD.  Lungs: Clear bilaterally to auscultation, normal effort of breathing Heart: HRRR . Normal S1 and S2 without gallops or murmurs.  Abdomen: Bowel sounds are present Msk:  Back normal, able to sit upright in bed. Normal strength and tone for age. Extremities: No clubbing, cyanosis or edema.   Neuro: Alert and oriented X 3. Psych:  Good affect, responds appropriately  Labs:   Lab Results  Component Value Date   WBC 8.5 01/23/2018   HGB 8.9 (L) 01/23/2018   HCT 26.5 (L) 01/23/2018   MCV 84.0 01/23/2018   PLT 129 (L) 01/23/2018    Recent Labs  Lab 01/23/18 0418  NA 138  K 4.1  CL 112*  CO2 18*  BUN 30*  CREATININE 1.53*  CALCIUM 8.1*  GLUCOSE 205*   Lab Results  Component Value Date   TROPONINI <0.03 12/29/2014    Lab Results  Component Value Date   CHOL 72 07/28/2013   Lab Results  Component Value Date   HDL 28 (L) 07/28/2013   Lab Results  Component Value Date   LDLCALC 25 07/28/2013    Lab Results  Component Value Date   TRIG 93 07/28/2013   No results found for: CHOLHDL No results found for: LDLDIRECT    Radiology: No results found.  EKG: Ventricular paced rhythm, 62 bpm  ASSESSMENT AND PLAN:  1. Obstructing colon mass, requiring surgical resection 2. CAD, status post CABG in 1986, currently without chest  pain 3. BiV ICD in 2014 4. Persistent atrial fibrillation on warfarin, which is being held preoperatively, rate controlled, asymptomatic 5. Ischemic cardiomyopathy, EF 30%   Plan: 1. Dr. Saralyn Pilar spoke with Dr. Stann Mainland' associate who was on-call at [facility] per patient's request, who agreed with our plan. Proceed with colon surgery. The patient has been medically optimized from a cardiovascular standpoint.  2. Recommend continuing carvedilol pre-, peri-, and postoperatively. 3. Resume warfarin as soon as safely possible with goal INR 2.0-3.0   Sign off for now; call with any questions. Signed: Clabe Seal MD,PhD, [facility] 01/23/2018, 4:55 PM

## 2018-01-23 NOTE — Op Note (Signed)
[facility] Gastroenterology Patient Name: Luis Palmer Procedure Date: 01/23/2018 11:13 AM MRN: [id number] Account #: [id number] Date of Birth: Jul 10, 1932 Admit Type: Inpatient Age: 84 Room: [facility] ENDO ROOM 4 Gender: Male Note Status: Finalized Procedure:            Colonoscopy Indications:          Acute post hemorrhagic anemia Providers:            Lucilla Lame MD, MD Referring MD:         Leonie Douglas. Doy Hutching, MD (Referring MD) Medicines:            Propofol per Anesthesia Complications:        No immediate complications. Procedure:            Pre-Anesthesia Assessment:                       - Prior to the procedure, a History and Physical was                        performed, and patient medications and allergies were                        reviewed. The patient's tolerance of previous                        anesthesia was also reviewed. The risks and benefits of                        the procedure and the sedation options and risks were                        discussed with the patient. All questions were                        answered, and informed consent was obtained. Prior                        Anticoagulants: The patient has taken no previous                        anticoagulant or antiplatelet agents. ASA Grade                        Assessment: III - A patient with severe systemic                        disease. After reviewing the risks and benefits, the                        patient was deemed in satisfactory condition to undergo                        the procedure.                       After obtaining informed consent, the colonoscope was                        passed under direct vision. Throughout the procedure,  the patient's blood pressure, pulse, and oxygen                        saturations were monitored continuously. The                        Colonoscope was introduced through the anus and   advanced to the the transverse colon to examine a mass.                        This was the intended extent. The colonoscopy was                        performed without difficulty. The patient tolerated the                        procedure well. The quality of the bowel preparation                        was fair. Findings:      The perianal and digital rectal examinations were normal.      A 5 mm polyp was found in the distal transverse colon. The polyp was       sessile. The polyp was removed with a cold snare. Resection and       retrieval were complete.      A partially obstructing large mass was found in the mid transverse       colon. The mass was circumferential. No bleeding was present. This was       biopsied with a cold forceps for histology. Area was successfully       injected with Spot (carbon black) for tattooing. Impression:           - Preparation of the colon was fair.                       - One 5 mm polyp in the distal transverse colon,                        removed with a cold snare. Resected and retrieved.                       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 Biopsied. Injected. Recommendation:       - Return patient to hospital ward for ongoing care.                       - Refer to a Psychologist, sport and exercise. Procedure Code(s):    --- Professional ---                       [phone], 52, Colonoscopy, flexible; with removal of                        tumor(s), polyp(s), or other lesion(s) by snare                        technique  90300, 52, Colonoscopy, flexible; with directed                        submucosal injection(s), any substance                       45380, 59,52, Colonoscopy, flexible; with biopsy,                        single or multiple Diagnosis Code(s):    --- Professional ---                       D62, Acute posthemorrhagic anemia                       D49.0, Neoplasm of  unspecified behavior of digestive                        system                       D12.3, Benign neoplasm of transverse colon (hepatic                        flexure or splenic flexure) CPT copyright 2017 American Medical Association. All rights reserved. The codes documented in this report are preliminary and upon coder review may  be revised to meet current compliance requirements. Lucilla Lame MD, MD 01/23/2018 11:51:47 AM This report has been signed electronically. Number of Addenda: 0 Note Initiated On: 01/23/2018 11:13 AM Total Procedure Duration: 0 hours 16 minutes 24 seconds       [facility]

## 2018-01-23 NOTE — Progress Notes (Signed)
[place] [facility] [place] NAME: Luis Palmer    MR#:  [id number]  DATE OF BIRTH:  07/26/1932  SUBJECTIVE:  CHIEF COMPLAINT: Dizziness is better no other bowel movements since admission Recent had EGD on August 26 and colonoscopy in August 27  REVIEW OF SYSTEMS:  CONSTITUTIONAL: No fever, fatigue or weakness.  EYES: No blurred or double vision.  EARS, NOSE, AND THROAT: No tinnitus or ear pain.  RESPIRATORY: No cough, shortness of breath, wheezing or hemoptysis.  CARDIOVASCULAR: No chest pain, orthopnea, edema.  GASTROINTESTINAL: No nausea, vomiting, diarrhea or abdominal pain.  GENITOURINARY: No dysuria, hematuria.  ENDOCRINE: No polyuria, nocturia,  HEMATOLOGY: No anemia, easy bruising or bleeding SKIN: No rash or lesion. MUSCULOSKELETAL: No joint pain or arthritis.   NEUROLOGIC: No tingling, numbness, weakness.  PSYCHIATRY: No anxiety or depression.   DRUG ALLERGIES:   Allergies  Allergen Reactions  . Pseudoephedrine Hcl Other (See Comments)    Other Reaction: ANURIA  . Ace Inhibitors Cough and Other (See Comments)    cough cough   . Other Other (See Comments)    Other Reaction: fluid retention Other Reaction: fluid retention  . Nsaids Other (See Comments)    Reaction:  Fluid retention   . Sudafed [Pseudoephedrine Hcl] Other (See Comments)    Pt states that it messes up his prostate.    . Vasotec [Enalapril] Cough    VITALS:  Blood pressure (!) 104/55, pulse 60, temperature (!) 97.5 F (36.4 C), temperature source Oral, resp. rate 20, height 6' (1.829 m), weight 99.8 kg, SpO2 97 %.  PHYSICAL EXAMINATION:  GENERAL:  84 y.o.-year-old patient lying in the bed with no acute distress.  EYES: Pupils equal, round, reactive to light and accommodation. No scleral icterus. Extraocular muscles intact.  HEENT: Head atraumatic, normocephalic. Oropharynx and nasopharynx clear.  NECK:  Supple, no jugular venous distention. No thyroid  enlargement, no tenderness.  LUNGS: Normal breath sounds bilaterally, no wheezing, rales,rhonchi or crepitation. No use of accessory muscles of respiration.  CARDIOVASCULAR: S1, S2 normal. No murmurs, rubs, or gallops.  ABDOMEN: Soft, nontender, nondistended. Bowel sounds present.  EXTREMITIES: No pedal edema, cyanosis, or clubbing.  NEUROLOGIC: Cranial nerves II through XII are intact. Muscle strength 5/5 in all extremities. Sensation intact. Gait not checked.  PSYCHIATRIC: The patient is alert and oriented x 3.  SKIN: No obvious rash, lesion, or ulcer.    LABORATORY PANEL:   CBC Recent Labs  Lab 01/23/18 0418  WBC 8.5  HGB 8.9*  HCT 26.5*  PLT 129*   ------------------------------------------------------------------------------------------------------------------  Chemistries  Recent Labs  Lab 01/23/18 0418  NA 138  K 4.1  CL 112*  CO2 18*  GLUCOSE 205*  BUN 30*  CREATININE 1.53*  CALCIUM 8.1*   ------------------------------------------------------------------------------------------------------------------  Cardiac Enzymes No results for input(s): TROPONINI in the last 168 hours. ------------------------------------------------------------------------------------------------------------------  RADIOLOGY:  No results found.  EKG:   Orders placed or performed during the hospital encounter of 01/21/18  . ED EKG  . ED EKG  . EKG    ASSESSMENT AND PLAN:   Patient is 84 year old with history of coronary artery disease atrial fibrillation on chronic Coumadin with bright red blood per rectum  1.  Acute lower GI bleed likely diverticular in nature Patient's hemoglobin is likely lower than what it showing currently Hemoglobin 7.8-7.7-1 unit of PRBC-9.0-9.1 Seen by gastroenterology EGD done January 22, 2018 has revealed gastritis and normal esophagus Patient had a colonoscopy on January 23, 2018  which has revealed One 5 mm polyp in the distal transverse colon,  removed with a cold snare. Resected and retrieved. 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  Hypotension blood pressure currently improved give IV fluids monitor blood pressure hold antihypertensive medication  #.  Hyperlipidemia continue Zetia  #  Diabetes type 2 Hold Metformin Place on sliding scale insulin for now  #.   Chronic atrial fibrillation rate controlled Coumadin on hold in view of GI bleed   Miscellaneous SCDs for DVT prophylaxis       All the records are reviewed and case discussed with Care Management/Social Workerr. Management plans discussed with the patient, family and they are in agreement.  CODE STATUS:   TOTAL TIME TAKING CARE OF THIS PATIENT: 31minutes.   POSSIBLE D/C IN 1-2 DAYS, DEPENDING ON CLINICAL CONDITION.  Note: This dictation was prepared with Dragon dictation along with smaller phrase technology. Any transcriptional errors that result from this process are unintentional.   Nicholes Mango M.D on 01/23/2018 at 1:54 PM  Between 7am to 6pm - Pager - [phone] After 6pm go to [email or web address] - password [email or web address] [place] [facility]  Office  [phone]  CC: Primary care physician; Idelle Crouch, MD

## 2018-01-23 NOTE — Anesthesia Postprocedure Evaluation (Signed)
Anesthesia Post Note  Patient: Luis Palmer  Procedure(s) Performed: COLONOSCOPY WITH PROPOFOL (N/A )  Patient location during evaluation: Endoscopy Anesthesia Type: General Level of consciousness: awake and alert Pain management: pain level controlled Vital Signs Assessment: post-procedure vital signs reviewed and stable Respiratory status: spontaneous breathing, nonlabored ventilation, respiratory function stable and patient connected to nasal cannula oxygen Cardiovascular status: blood pressure returned to baseline and stable Postop Assessment: no apparent nausea or vomiting Anesthetic complications: no     Last Vitals:  Vitals:   01/23/18 1258 01/23/18 1330  BP: (!) 104/55 (!) 98/55  Pulse: 60 (!) 59  Resp: 20 18  Temp: (!) 36.4 C 36.6 C  SpO2: 97% 94%    Last Pain:  Vitals:   01/23/18 1330  TempSrc: Oral  PainSc:                  Bucky Grigg S

## 2018-01-24 ENCOUNTER — Inpatient Hospital Stay (HOSPITAL_COMMUNITY)
Admit: 2018-01-24 | Discharge: 2018-01-24 | Disposition: A | Discharge status: Home / Self Care | Payer: Medicare Other | Attending: Internal Medicine | Admitting: Internal Medicine

## 2018-01-24 DIAGNOSIS — D500 Iron deficiency anemia secondary to blood loss (chronic): Secondary | ICD-10-CM

## 2018-01-24 DIAGNOSIS — C189 Malignant neoplasm of colon, unspecified: Secondary | ICD-10-CM

## 2018-01-24 DIAGNOSIS — K922 Gastrointestinal hemorrhage, unspecified: Secondary | ICD-10-CM

## 2018-01-24 DIAGNOSIS — I4891 Unspecified atrial fibrillation: Secondary | ICD-10-CM

## 2018-01-24 DIAGNOSIS — Z87891 Personal history of nicotine dependence: Secondary | ICD-10-CM

## 2018-01-24 LAB — GLUCOSE, CAPILLARY
GLUCOSE-CAPILLARY: 121 mg/dL — AB (ref 70–99)
GLUCOSE-CAPILLARY: 176 mg/dL — AB (ref 70–99)
GLUCOSE-CAPILLARY: 203 mg/dL — AB (ref 70–99)
GLUCOSE-CAPILLARY: 146 mg/dL — AB (ref 70–99)

## 2018-01-24 LAB — SURGICAL PATHOLOGY

## 2018-01-24 LAB — CBC
HEMATOCRIT: 25.6 % — AB (ref 40.0–52.0)
HEMOGLOBIN: 8.7 g/dL — AB (ref 13.0–18.0)
MCH: 28.5 pg (ref 26.0–34.0)
MCHC: 34.1 g/dL (ref 32.0–36.0)
MCV: 83.7 fL (ref 80.0–100.0)
Platelets: 115 10*3/uL — ABNORMAL LOW (ref 150–440)
RBC: 3.06 MIL/uL — ABNORMAL LOW (ref 4.40–5.90)
RDW: 17.5 % — ABNORMAL HIGH (ref 11.5–14.5)
WBC: 7.2 10*3/uL (ref 3.8–10.6)

## 2018-01-24 LAB — BASIC METABOLIC PANEL
ANION GAP: 6 (ref 5–15)
BUN: 29 mg/dL — ABNORMAL HIGH (ref 8–23)
CHLORIDE: 114 mmol/L — AB (ref 98–111)
CO2: 19 mmol/L — AB (ref 22–32)
Calcium: 7.9 mg/dL — ABNORMAL LOW (ref 8.9–10.3)
Creatinine, Ser: 1.47 mg/dL — ABNORMAL HIGH (ref 0.61–1.24)
GFR calc Af Amer: 49 mL/min — ABNORMAL LOW (ref >60)
GFR calc non Af Amer: 42 mL/min — ABNORMAL LOW (ref >60)
GLUCOSE: 133 mg/dL — AB (ref 70–99)
POTASSIUM: 4 mmol/L (ref 3.5–5.1)
Sodium: 139 mmol/L (ref 135–145)

## 2018-01-24 LAB — LACTIC ACID, PLASMA: Lactic Acid, Venous: 1.3 mmol/L (ref 0.5–1.9)

## 2018-01-24 LAB — PROTIME-INR
INR: 1.56
Prothrombin Time: 18.5 seconds — ABNORMAL HIGH (ref 11.4–15.2)

## 2018-01-24 LAB — ECHOCARDIOGRAM COMPLETE
Height: 72 in
WEIGHTICAEL: 3520 [oz_av]

## 2018-01-24 LAB — CEA: CEA1: 8.6 ng/mL — AB (ref 0.0–4.7)

## 2018-01-24 LAB — PROCALCITONIN: Procalcitonin: 0.21 ng/mL

## 2018-01-24 NOTE — Progress Notes (Signed)
*  PRELIMINARY RESULTS* Echocardiogram 2D Echocardiogram has been performed.  Sherrie Sport 01/24/2018, 8:30 AM

## 2018-01-24 NOTE — Progress Notes (Signed)
Patient's wife Lelon Frohlich requests that medical team does not speak to patient unless she is present. She states that the patient forgets what the doctors tell him once they leave the room.

## 2018-01-24 NOTE — Progress Notes (Signed)
[facility] [place] NAME: Luis Palmer    MR#:  [id number]  DATE OF BIRTH:  Feb 14, 1933  SUBJECTIVE:  CHIEF COMPLAINT: Denies any abdominal discomfort Recent had EGD on August 26 and colonoscopy on August 27 as revealed transverse colon mass  REVIEW OF SYSTEMS:  CONSTITUTIONAL: No fever, fatigue or weakness.  EYES: No blurred or double vision.  EARS, NOSE, AND THROAT: No tinnitus or ear pain.  RESPIRATORY: No cough, shortness of breath, wheezing or hemoptysis.  CARDIOVASCULAR: No chest pain, orthopnea, edema.  GASTROINTESTINAL: No nausea, vomiting, diarrhea or abdominal pain.  GENITOURINARY: No dysuria, hematuria.  ENDOCRINE: No polyuria, nocturia,  HEMATOLOGY: No anemia, easy bruising or bleeding SKIN: No rash or lesion. MUSCULOSKELETAL: No joint pain or arthritis.   NEUROLOGIC: No tingling, numbness, weakness.  PSYCHIATRY: No anxiety or depression.   DRUG ALLERGIES:   Allergies  Allergen Reactions  . Pseudoephedrine Hcl Other (See Comments)    Other Reaction: ANURIA  . Ace Inhibitors Cough and Other (See Comments)    cough cough   . Other Other (See Comments)    Other Reaction: fluid retention Other Reaction: fluid retention  . Nsaids Other (See Comments)    Reaction:  Fluid retention   . Sudafed [Pseudoephedrine Hcl] Other (See Comments)    Pt states that it messes up his prostate.    Michail Sermon [Enalapril] Cough    VITALS:  Blood pressure 111/75, pulse 78, temperature 98.5 F (36.9 C), temperature source Oral, resp. rate 18, height 6' (1.829 m), weight 99.8 kg, SpO2 96 %.  PHYSICAL EXAMINATION:  GENERAL:  84 y.o.-year-old patient lying in the bed with no acute distress.  EYES: Pupils equal, round, reactive to light and accommodation. No scleral icterus. Extraocular muscles intact.  HEENT: Head atraumatic, normocephalic. Oropharynx and nasopharynx clear.  NECK:  Supple, no jugular venous distention. No thyroid  enlargement, no tenderness.  LUNGS: Normal breath sounds bilaterally, no wheezing, rales,rhonchi or crepitation. No use of accessory muscles of respiration.  CARDIOVASCULAR: S1, S2 normal. No murmurs, rubs, or gallops.  ABDOMEN: Soft, nontender, nondistended. Bowel sounds present.  EXTREMITIES: No pedal edema, cyanosis, or clubbing.  NEUROLOGIC: Cranial nerves II through XII are intact. Muscle strength 5/5 in all extremities. Sensation intact. Gait not checked.  PSYCHIATRIC: The patient is alert and oriented x 3.  SKIN: No obvious rash, lesion, or ulcer.    LABORATORY PANEL:   CBC Recent Labs  Lab 01/24/18 0337  WBC 7.2  HGB 8.7*  HCT 25.6*  PLT 115*   ------------------------------------------------------------------------------------------------------------------  Chemistries  Recent Labs  Lab 01/24/18 0337  NA 139  K 4.0  CL 114*  CO2 19*  GLUCOSE 133*  BUN 29*  CREATININE 1.47*  CALCIUM 7.9*   ------------------------------------------------------------------------------------------------------------------  Cardiac Enzymes No results for input(s): TROPONINI in the last 168 hours. ------------------------------------------------------------------------------------------------------------------  RADIOLOGY:  Ct Chest W Contrast  Result Date: 01/23/2018 CLINICAL DATA:  Hypotension and dizziness. Possible malignant tumor found in colon on colonoscopy earlier today. EXAM: CT CHEST, ABDOMEN, AND PELVIS WITH CONTRAST TECHNIQUE: Multidetector CT imaging of the chest, abdomen and pelvis was performed following the standard protocol during bolus administration of intravenous contrast. CONTRAST:  59mL OMNIPAQUE IOHEXOL 300 MG/ML  SOLN COMPARISON:  CXR 12/29/2014 FINDINGS: CT CHEST FINDINGS Cardiovascular: Conventional branch pattern of the great vessels with atherosclerosis. No significant stenosis. There is moderate atherosclerosis of the thoracic aorta without aneurysm. Left  main and three-vessel coronary arteriosclerosis of the native coronary arteries  identified with post CABG change. Left-sided ICD device with leads in the right atrium, right ventricle and coronary sinus. No pericardial effusion. There is cardiomegaly. No large central pulmonary embolus. Mediastinum/Nodes: Small subcentimeter short axis lymph nodes within the mediastinum and both hila. Mild subcarinal lymphadenopathy up to 18 mm short axis. Trachea and mainstem bronchi are patent. Esophagus is unremarkable. Lungs/Pleura: Small right greater than left pleural effusions. Patchy ground-glass opacities are noted throughout both lungs with mild peribronchial thickening. Findings are nonspecific but atypical pneumonia, alveolitis or pneumonitis are among some possibilities or stigmata of CHF given the small pleural effusions and cardiomegaly. No pneumothorax. No dominant mass is identified. Musculoskeletal: No aggressive osseous lesions or fracture. CT ABDOMEN PELVIS FINDINGS Hepatobiliary: Distended gallbladder with mural thickening and edema raising concern for cholecystitis. Sympathetic thickening from possible adjacent pancreatitis given peripancreatic edema is also a possibility. The liver is unremarkable and free of enhancing mass lesions. No biliary dilatation. Pancreas: Peripancreatic edema is noted without space-occupying mass of the pancreas or ductal dilatation. Pancreatitis is a possibility without complicating features. Spleen: Splenic granulomas.  No splenomegaly. Adrenals/Urinary Tract: Normal bilateral adrenal glands. Water attenuating cyst off the lower pole of the right kidney measuring 19 mm in diameter. No nephrolithiasis nor hydroureteronephrosis. The urinary bladder is unremarkable for the degree of distention. Stomach/Bowel: Physiologic distention of the stomach with normal small bowel rotation and ligament of Treitz position. No small bowel obstruction or inflammation. Distal and terminal ileum are  normal. Segmental thickening of the ascending colon is identified either representing neoplastic enlargement and thickening or focal inflammatory thickening suggest colitis. Clinical correlation with findings at colonoscopy recommended. Appendectomy. Vascular/Lymphatic: Moderate aortoiliac and branch vessel atherosclerosis without aneurysm. Suboptimal opacification of the splenic and portal veins. Small porta hepatic subcentimeter short axis lymph nodes. No adenopathy by CT size criteria. Reproductive: Enlarged prostate measuring 6.6 x 6 x 5.6 cm. Other: Small fat containing left inguinal hernia. Small periumbilical fat containing hernia. Musculoskeletal: No aggressive osseous lesions. Mild degenerative change along the thoracolumbar spine. IMPRESSION: Chest CT: 1. Patchy ground-glass opacities throughout the visualized lungs potentially representing stigmata of CHF given cardiomegaly and small bilateral pleural effusions. Alveolitis or pneumonitis and/or potentially hypoventilatory change versus atypical pneumonia are some other possible considerations. No dominant mass. 2. Status post CABG with ICD device in place. Coronary arteriosclerosis of the native coronary arteries. 3.  Aortic atherosclerosis (ICD10-I70.0) CT abdomen pelvis: 1. Peripancreatic edema raising the possibility of uncomplicated acute pancreatitis. Correlate clinically. 2. Somewhat distended and thick-walled appearance of the gallbladder which could be sympathetic due to adjacent pancreatitis versus representing changes of acute cholecystitis. 3. Normal size spleen with splenic granulomata. 4. Water attenuating cyst off the lower pole the right kidney measuring 19 mm. 5. Abnormal segmental thickening of the ascending colon, neoplasm versus marked inflammatory change/colitis. 6. Enlarged prostate. 7. Moderate aortoiliac and branch vessel atherosclerosis without aneurysm. No adenopathy. 8. Small fat containing left inguinal hernia and periumbilical  fat containing hernia. Electronically Signed   By: Ashley Royalty M.D.   On: 01/23/2018 20:36   Ct Abdomen Pelvis W Contrast  Result Date: 01/23/2018 CLINICAL DATA:  Hypotension and dizziness. Possible malignant tumor found in colon on colonoscopy earlier today. EXAM: CT CHEST, ABDOMEN, AND PELVIS WITH CONTRAST TECHNIQUE: Multidetector CT imaging of the chest, abdomen and pelvis was performed following the standard protocol during bolus administration of intravenous contrast. CONTRAST:  69mL OMNIPAQUE IOHEXOL 300 MG/ML  SOLN COMPARISON:  CXR 12/29/2014 FINDINGS: CT CHEST FINDINGS Cardiovascular: Conventional branch pattern of  the great vessels with atherosclerosis. No significant stenosis. There is moderate atherosclerosis of the thoracic aorta without aneurysm. Left main and three-vessel coronary arteriosclerosis of the native coronary arteries identified with post CABG change. Left-sided ICD device with leads in the right atrium, right ventricle and coronary sinus. No pericardial effusion. There is cardiomegaly. No large central pulmonary embolus. Mediastinum/Nodes: Small subcentimeter short axis lymph nodes within the mediastinum and both hila. Mild subcarinal lymphadenopathy up to 18 mm short axis. Trachea and mainstem bronchi are patent. Esophagus is unremarkable. Lungs/Pleura: Small right greater than left pleural effusions. Patchy ground-glass opacities are noted throughout both lungs with mild peribronchial thickening. Findings are nonspecific but atypical pneumonia, alveolitis or pneumonitis are among some possibilities or stigmata of CHF given the small pleural effusions and cardiomegaly. No pneumothorax. No dominant mass is identified. Musculoskeletal: No aggressive osseous lesions or fracture. CT ABDOMEN PELVIS FINDINGS Hepatobiliary: Distended gallbladder with mural thickening and edema raising concern for cholecystitis. Sympathetic thickening from possible adjacent pancreatitis given peripancreatic  edema is also a possibility. The liver is unremarkable and free of enhancing mass lesions. No biliary dilatation. Pancreas: Peripancreatic edema is noted without space-occupying mass of the pancreas or ductal dilatation. Pancreatitis is a possibility without complicating features. Spleen: Splenic granulomas.  No splenomegaly. Adrenals/Urinary Tract: Normal bilateral adrenal glands. Water attenuating cyst off the lower pole of the right kidney measuring 19 mm in diameter. No nephrolithiasis nor hydroureteronephrosis. The urinary bladder is unremarkable for the degree of distention. Stomach/Bowel: Physiologic distention of the stomach with normal small bowel rotation and ligament of Treitz position. No small bowel obstruction or inflammation. Distal and terminal ileum are normal. Segmental thickening of the ascending colon is identified either representing neoplastic enlargement and thickening or focal inflammatory thickening suggest colitis. Clinical correlation with findings at colonoscopy recommended. Appendectomy. Vascular/Lymphatic: Moderate aortoiliac and branch vessel atherosclerosis without aneurysm. Suboptimal opacification of the splenic and portal veins. Small porta hepatic subcentimeter short axis lymph nodes. No adenopathy by CT size criteria. Reproductive: Enlarged prostate measuring 6.6 x 6 x 5.6 cm. Other: Small fat containing left inguinal hernia. Small periumbilical fat containing hernia. Musculoskeletal: No aggressive osseous lesions. Mild degenerative change along the thoracolumbar spine. IMPRESSION: Chest CT: 1. Patchy ground-glass opacities throughout the visualized lungs potentially representing stigmata of CHF given cardiomegaly and small bilateral pleural effusions. Alveolitis or pneumonitis and/or potentially hypoventilatory change versus atypical pneumonia are some other possible considerations. No dominant mass. 2. Status post CABG with ICD device in place. Coronary arteriosclerosis of the  native coronary arteries. 3.  Aortic atherosclerosis (ICD10-I70.0) CT abdomen pelvis: 1. Peripancreatic edema raising the possibility of uncomplicated acute pancreatitis. Correlate clinically. 2. Somewhat distended and thick-walled appearance of the gallbladder which could be sympathetic due to adjacent pancreatitis versus representing changes of acute cholecystitis. 3. Normal size spleen with splenic granulomata. 4. Water attenuating cyst off the lower pole the right kidney measuring 19 mm. 5. Abnormal segmental thickening of the ascending colon, neoplasm versus marked inflammatory change/colitis. 6. Enlarged prostate. 7. Moderate aortoiliac and branch vessel atherosclerosis without aneurysm. No adenopathy. 8. Small fat containing left inguinal hernia and periumbilical fat containing hernia. Electronically Signed   By: Ashley Royalty M.D.   On: 01/23/2018 20:36    EKG:   Orders placed or performed during the hospital encounter of 01/21/18  . ED EKG  . ED EKG  . EKG  . EKG 12-Lead  . EKG 12-Lead    ASSESSMENT AND PLAN:   Patient is 84 year old with history of coronary  artery disease atrial fibrillation on chronic Coumadin with bright red blood per rectum  1.  Acute lower GI bleed likely diverticular in nature Patient's hemoglobin is likely lower than what it showing currently Hemoglobin 7.8-7.7-1 unit of PRBC-9.0-9.1 Seen by gastroenterology EGD done January 22, 2018 has revealed gastritis and normal esophagus Patient had a colonoscopy on January 23, 2018 which has revealed One 5 mm polyp in the distal transverse colon, removed with a cold snare. Resected and retrieved. Likely malignant partially obstructing tumor in the mid transverse colon Plan is to advance diet as tolerated and monitor hemoglobin hematocrit closely He has received vitamin K and be receiving Kcentra for his elevated INR hold Coumadin for now  #Acute pancreatitis /acute cholecystitis per CT scan report Probably from the  underlying colon mass We will check lipase and LFTs Patient denies any abdominal pain today    #colon Mass-probably malignant Await for biopsy results and surgery consult placed Will add oncology consult , biopsy results are still pending  #AKI  IV fluids, avoid nephrotoxins, monitor renal function Creatinine 1.31-1.53--1.47  #.  Hypotension blood pressure currently improved give IV fluids monitor blood pressure hold antihypertensive medication  #.  Hyperlipidemia continue Zetia  #  Diabetes type 2 Hold Metformin Place on sliding scale insulin for now  #.   Chronic atrial fibrillation rate controlled Coumadin on hold in view of GI bleed   Miscellaneous SCDs for DVT prophylaxis       All the records are reviewed and case discussed with Care Management/Social Workerr. Management plans discussed with the patient, family and they are in agreement.  CODE STATUS:   TOTAL TIME TAKING CARE OF THIS PATIENT: 81minutes.   POSSIBLE D/C IN 1-2 DAYS, DEPENDING ON CLINICAL CONDITION.  Note: This dictation was prepared with Dragon dictation along with smaller phrase technology. Any transcriptional errors that result from this process are unintentional.   Nicholes Mango M.D on 01/24/2018 at 12:15 PM  Between 7am to 6pm - Pager - [phone] After 6pm go to [email or web address] - password [email or web address] [facility]  Office  [phone]  CC: Primary care physician; Idelle Crouch, MD

## 2018-01-24 NOTE — Care Management Important Message (Signed)
Copy of signed IM left with patient and family in room. 

## 2018-01-25 ENCOUNTER — Encounter: Payer: Self-pay | Admitting: Gastroenterology

## 2018-01-25 ENCOUNTER — Inpatient Hospital Stay (HOSPITAL_COMMUNITY)
Admission: AD | Admit: 2018-01-25 | Discharge: 2018-02-12 | DRG: 329 | Disposition: A | Discharge status: Home Health | Payer: Medicare Other | Source: Other Acute Inpatient Hospital | Attending: Family Medicine | Admitting: Family Medicine

## 2018-01-25 ENCOUNTER — Inpatient Hospital Stay (HOSPITAL_COMMUNITY): Payer: Medicare Other

## 2018-01-25 DIAGNOSIS — Z87891 Personal history of nicotine dependence: Secondary | ICD-10-CM | POA: Diagnosis not present

## 2018-01-25 DIAGNOSIS — I447 Left bundle-branch block, unspecified: Secondary | ICD-10-CM | POA: Diagnosis present

## 2018-01-25 DIAGNOSIS — Z7901 Long term (current) use of anticoagulants: Secondary | ICD-10-CM | POA: Diagnosis not present

## 2018-01-25 DIAGNOSIS — C182 Malignant neoplasm of ascending colon: Secondary | ICD-10-CM | POA: Diagnosis present

## 2018-01-25 DIAGNOSIS — R0602 Shortness of breath: Secondary | ICD-10-CM

## 2018-01-25 DIAGNOSIS — Z23 Encounter for immunization: Secondary | ICD-10-CM

## 2018-01-25 DIAGNOSIS — Z888 Allergy status to other drugs, medicaments and biological substances status: Secondary | ICD-10-CM

## 2018-01-25 DIAGNOSIS — D490 Neoplasm of unspecified behavior of digestive system: Secondary | ICD-10-CM | POA: Diagnosis not present

## 2018-01-25 DIAGNOSIS — K2970 Gastritis, unspecified, without bleeding: Secondary | ICD-10-CM | POA: Diagnosis present

## 2018-01-25 DIAGNOSIS — N183 Chronic kidney disease, stage 3 (moderate): Secondary | ICD-10-CM | POA: Diagnosis present

## 2018-01-25 DIAGNOSIS — Z951 Presence of aortocoronary bypass graft: Secondary | ICD-10-CM

## 2018-01-25 DIAGNOSIS — I509 Heart failure, unspecified: Secondary | ICD-10-CM

## 2018-01-25 DIAGNOSIS — K921 Melena: Secondary | ICD-10-CM | POA: Diagnosis present

## 2018-01-25 DIAGNOSIS — G4733 Obstructive sleep apnea (adult) (pediatric): Secondary | ICD-10-CM | POA: Diagnosis present

## 2018-01-25 DIAGNOSIS — E876 Hypokalemia: Secondary | ICD-10-CM | POA: Diagnosis not present

## 2018-01-25 DIAGNOSIS — I482 Chronic atrial fibrillation, unspecified: Secondary | ICD-10-CM

## 2018-01-25 DIAGNOSIS — I951 Orthostatic hypotension: Secondary | ICD-10-CM | POA: Diagnosis not present

## 2018-01-25 DIAGNOSIS — I5023 Acute on chronic systolic (congestive) heart failure: Secondary | ICD-10-CM | POA: Diagnosis not present

## 2018-01-25 DIAGNOSIS — Z01810 Encounter for preprocedural cardiovascular examination: Secondary | ICD-10-CM | POA: Diagnosis not present

## 2018-01-25 DIAGNOSIS — Z95810 Presence of automatic (implantable) cardiac defibrillator: Secondary | ICD-10-CM | POA: Diagnosis not present

## 2018-01-25 DIAGNOSIS — I130 Hypertensive heart and chronic kidney disease with heart failure and stage 1 through stage 4 chronic kidney disease, or unspecified chronic kidney disease: Secondary | ICD-10-CM | POA: Diagnosis present

## 2018-01-25 DIAGNOSIS — I2510 Atherosclerotic heart disease of native coronary artery without angina pectoris: Secondary | ICD-10-CM | POA: Diagnosis present

## 2018-01-25 DIAGNOSIS — I5022 Chronic systolic (congestive) heart failure: Secondary | ICD-10-CM | POA: Diagnosis present

## 2018-01-25 DIAGNOSIS — C189 Malignant neoplasm of colon, unspecified: Secondary | ICD-10-CM | POA: Diagnosis present

## 2018-01-25 DIAGNOSIS — E1122 Type 2 diabetes mellitus with diabetic chronic kidney disease: Secondary | ICD-10-CM | POA: Diagnosis present

## 2018-01-25 DIAGNOSIS — I255 Ischemic cardiomyopathy: Secondary | ICD-10-CM | POA: Diagnosis present

## 2018-01-25 DIAGNOSIS — K922 Gastrointestinal hemorrhage, unspecified: Secondary | ICD-10-CM | POA: Diagnosis not present

## 2018-01-25 DIAGNOSIS — D509 Iron deficiency anemia, unspecified: Secondary | ICD-10-CM | POA: Diagnosis present

## 2018-01-25 DIAGNOSIS — D508 Other iron deficiency anemias: Secondary | ICD-10-CM | POA: Diagnosis not present

## 2018-01-25 DIAGNOSIS — I4819 Other persistent atrial fibrillation: Secondary | ICD-10-CM | POA: Diagnosis present

## 2018-01-25 DIAGNOSIS — C184 Malignant neoplasm of transverse colon: Principal | ICD-10-CM | POA: Diagnosis present

## 2018-01-25 DIAGNOSIS — F0390 Unspecified dementia without behavioral disturbance: Secondary | ICD-10-CM | POA: Diagnosis present

## 2018-01-25 DIAGNOSIS — D62 Acute posthemorrhagic anemia: Secondary | ICD-10-CM | POA: Diagnosis present

## 2018-01-25 DIAGNOSIS — Z9989 Dependence on other enabling machines and devices: Secondary | ICD-10-CM

## 2018-01-25 DIAGNOSIS — E7800 Pure hypercholesterolemia, unspecified: Secondary | ICD-10-CM | POA: Diagnosis present

## 2018-01-25 DIAGNOSIS — D500 Iron deficiency anemia secondary to blood loss (chronic): Secondary | ICD-10-CM | POA: Diagnosis present

## 2018-01-25 DIAGNOSIS — Z794 Long term (current) use of insulin: Secondary | ICD-10-CM

## 2018-01-25 DIAGNOSIS — Z79899 Other long term (current) drug therapy: Secondary | ICD-10-CM

## 2018-01-25 DIAGNOSIS — N400 Enlarged prostate without lower urinary tract symptoms: Secondary | ICD-10-CM | POA: Diagnosis present

## 2018-01-25 HISTORY — DX: Presence of automatic (implantable) cardiac defibrillator: Z95.810

## 2018-01-25 HISTORY — DX: Chronic kidney disease, stage 3 unspecified: N18.30

## 2018-01-25 HISTORY — DX: Type 2 diabetes mellitus without complications: E11.9

## 2018-01-25 HISTORY — DX: Pure hypercholesterolemia, unspecified: E78.00

## 2018-01-25 HISTORY — DX: Obstructive sleep apnea (adult) (pediatric): G47.33

## 2018-01-25 HISTORY — DX: Personal history of other diseases of the musculoskeletal system and connective tissue: Z87.39

## 2018-01-25 HISTORY — DX: Acute myocardial infarction, unspecified: I21.9

## 2018-01-25 HISTORY — DX: Chronic kidney disease, stage 3 (moderate): N18.3

## 2018-01-25 HISTORY — DX: Malignant neoplasm of colon, unspecified: C18.9

## 2018-01-25 HISTORY — DX: Pneumonia, unspecified organism: J18.9

## 2018-01-25 HISTORY — DX: Personal history of other medical treatment: Z92.89

## 2018-01-25 HISTORY — DX: Dependence on other enabling machines and devices: Z99.89

## 2018-01-25 LAB — CBC
HEMATOCRIT: 27.1 % — AB (ref 40.0–52.0)
Hemoglobin: 9.3 g/dL — ABNORMAL LOW (ref 13.0–18.0)
MCH: 29.1 pg (ref 26.0–34.0)
MCHC: 34.3 g/dL (ref 32.0–36.0)
MCV: 85 fL (ref 80.0–100.0)
Platelets: 127 10*3/uL — ABNORMAL LOW (ref 150–440)
RBC: 3.2 MIL/uL — AB (ref 4.40–5.90)
RDW: 18 % — AB (ref 11.5–14.5)
WBC: 8.7 10*3/uL (ref 3.8–10.6)

## 2018-01-25 LAB — COMPREHENSIVE METABOLIC PANEL
ALBUMIN: 2.9 g/dL — AB (ref 3.5–5.0)
ALK PHOS: 46 U/L (ref 38–126)
ALT: 42 U/L (ref 0–44)
AST: 37 U/L (ref 15–41)
Anion gap: 9 (ref 5–15)
BILIRUBIN TOTAL: 1.2 mg/dL (ref 0.3–1.2)
BUN: 28 mg/dL — AB (ref 8–23)
CALCIUM: 8.2 mg/dL — AB (ref 8.9–10.3)
CO2: 20 mmol/L — ABNORMAL LOW (ref 22–32)
CREATININE: 1.6 mg/dL — AB (ref 0.61–1.24)
Chloride: 110 mmol/L (ref 98–111)
GFR calc Af Amer: 44 mL/min — ABNORMAL LOW (ref >60)
GFR calc non Af Amer: 38 mL/min — ABNORMAL LOW (ref >60)
Glucose, Bld: 155 mg/dL — ABNORMAL HIGH (ref 70–99)
POTASSIUM: 4.2 mmol/L (ref 3.5–5.1)
Sodium: 139 mmol/L (ref 135–145)
TOTAL PROTEIN: 6 g/dL — AB (ref 6.5–8.1)

## 2018-01-25 LAB — GLUCOSE, CAPILLARY
GLUCOSE-CAPILLARY: 143 mg/dL — AB (ref 70–99)
GLUCOSE-CAPILLARY: 167 mg/dL — AB (ref 70–99)
Glucose-Capillary: 232 mg/dL — ABNORMAL HIGH (ref 70–99)

## 2018-01-25 LAB — PROTIME-INR
INR: 1.74
Prothrombin Time: 20.2 seconds — ABNORMAL HIGH (ref 11.4–15.2)

## 2018-01-25 LAB — LIPASE, BLOOD: Lipase: 47 U/L (ref 11–51)

## 2018-01-25 LAB — CEA: CEA: 8.3 ng/mL — ABNORMAL HIGH (ref 0.0–4.7)

## 2018-01-25 LAB — PROCALCITONIN: PROCALCITONIN: 0.17 ng/mL

## 2018-01-25 MED ORDER — DONEPEZIL HCL 10 MG PO TABS
10.0000 mg | ORAL_TABLET | Freq: Every day | ORAL | Status: DC
  Administered 2018-01-25 – 2018-02-11 (×18): 10 mg via ORAL
  Filled 2018-01-25 (×18): qty 1

## 2018-01-25 MED ORDER — ACETAMINOPHEN 325 MG PO TABS
650.0000 mg | ORAL_TABLET | Freq: Four times a day (QID) | ORAL | Status: DC | PRN
  Administered 2018-02-01 – 2018-02-02 (×2): 650 mg via ORAL
  Filled 2018-01-25 (×2): qty 2

## 2018-01-25 MED ORDER — ACETAMINOPHEN 650 MG RE SUPP: 650.0000 mg | Freq: Four times a day (QID) | RECTAL | Status: DC | PRN

## 2018-01-25 MED ORDER — VITAMIN D 1000 UNITS PO TABS
2000.0000 [IU] | ORAL_TABLET | Freq: Every day | ORAL | Status: DC
  Administered 2018-01-25 – 2018-02-12 (×18): 2000 [IU] via ORAL
  Filled 2018-01-25 (×19): qty 2

## 2018-01-25 MED ORDER — INSULIN ASPART 100 UNIT/ML ~~LOC~~ SOLN
0.0000 [IU] | Freq: Every day | SUBCUTANEOUS | Status: DC
  Administered 2018-01-30 – 2018-02-02 (×3): 2 [IU] via SUBCUTANEOUS

## 2018-01-25 MED ORDER — INSULIN ASPART 100 UNIT/ML ~~LOC~~ SOLN
0.0000 [IU] | Freq: Three times a day (TID) | SUBCUTANEOUS | Status: DC
  Administered 2018-01-26 (×3): 1 [IU] via SUBCUTANEOUS
  Administered 2018-01-27: 2 [IU] via SUBCUTANEOUS
  Administered 2018-01-27 – 2018-01-28 (×2): 1 [IU] via SUBCUTANEOUS
  Administered 2018-01-28: 2 [IU] via SUBCUTANEOUS
  Administered 2018-01-28 – 2018-01-29 (×2): 1 [IU] via SUBCUTANEOUS
  Administered 2018-01-29 – 2018-01-30 (×3): 2 [IU] via SUBCUTANEOUS
  Administered 2018-01-31 (×3): 3 [IU] via SUBCUTANEOUS
  Administered 2018-02-01 (×2): 2 [IU] via SUBCUTANEOUS
  Administered 2018-02-01: 3 [IU] via SUBCUTANEOUS
  Administered 2018-02-02 (×2): 2 [IU] via SUBCUTANEOUS
  Administered 2018-02-02: 3 [IU] via SUBCUTANEOUS
  Administered 2018-02-03 (×2): 1 [IU] via SUBCUTANEOUS
  Administered 2018-02-03: 2 [IU] via SUBCUTANEOUS
  Administered 2018-02-04: 3 [IU] via SUBCUTANEOUS
  Administered 2018-02-04: 2 [IU] via SUBCUTANEOUS
  Administered 2018-02-05: 1 [IU] via SUBCUTANEOUS
  Administered 2018-02-05 (×2): 3 [IU] via SUBCUTANEOUS
  Administered 2018-02-06 (×2): 2 [IU] via SUBCUTANEOUS
  Administered 2018-02-07: 1 [IU] via SUBCUTANEOUS
  Administered 2018-02-07 – 2018-02-10 (×5): 2 [IU] via SUBCUTANEOUS
  Administered 2018-02-11: 1 [IU] via SUBCUTANEOUS
  Administered 2018-02-11: 2 [IU] via SUBCUTANEOUS
  Administered 2018-02-12: 1 [IU] via SUBCUTANEOUS

## 2018-01-25 MED ORDER — ROSUVASTATIN CALCIUM 10 MG PO TABS
10.0000 mg | ORAL_TABLET | Freq: Every day | ORAL | Status: DC
  Administered 2018-01-25 – 2018-02-12 (×18): 10 mg via ORAL
  Filled 2018-01-25 (×19): qty 1

## 2018-01-25 MED ORDER — EZETIMIBE 10 MG PO TABS
10.0000 mg | ORAL_TABLET | Freq: Every day | ORAL | Status: DC
  Administered 2018-01-25 – 2018-02-11 (×18): 10 mg via ORAL
  Filled 2018-01-25 (×21): qty 1

## 2018-01-25 MED ORDER — PANTOPRAZOLE SODIUM 40 MG PO TBEC
40.0000 mg | DELAYED_RELEASE_TABLET | Freq: Two times a day (BID) | ORAL | Status: DC
  Administered 2018-01-25 – 2018-02-12 (×35): 40 mg via ORAL
  Filled 2018-01-25 (×35): qty 1

## 2018-01-25 MED ORDER — SODIUM CHLORIDE 0.9% FLUSH: 3.0000 mL | INTRAVENOUS | Status: DC | PRN

## 2018-01-25 MED ORDER — ONDANSETRON HCL 4 MG/2ML IJ SOLN: 4.0000 mg | Freq: Four times a day (QID) | INTRAMUSCULAR | Status: DC | PRN

## 2018-01-25 MED ORDER — ONDANSETRON HCL 4 MG PO TABS: 4.0000 mg | ORAL_TABLET | Freq: Four times a day (QID) | ORAL | Status: DC | PRN

## 2018-01-25 MED ORDER — SODIUM CHLORIDE 0.9% FLUSH
3.0000 mL | Freq: Two times a day (BID) | INTRAVENOUS | Status: DC
  Administered 2018-01-26 – 2018-01-27 (×5): 3 mL via INTRAVENOUS
  Administered 2018-01-29: 10 mL via INTRAVENOUS
  Administered 2018-01-29 – 2018-02-04 (×5): 3 mL via INTRAVENOUS

## 2018-01-25 MED ORDER — FUROSEMIDE 10 MG/ML IJ SOLN
40.0000 mg | Freq: Two times a day (BID) | INTRAMUSCULAR | Status: DC
  Administered 2018-01-25 – 2018-01-26 (×2): 40 mg via INTRAVENOUS
  Filled 2018-01-25 (×2): qty 4

## 2018-01-25 MED ORDER — PANTOPRAZOLE SODIUM 40 MG PO TBEC: 40.0000 mg | DELAYED_RELEASE_TABLET | Freq: Two times a day (BID) | ORAL | Status: DC

## 2018-01-25 MED ORDER — CARVEDILOL 3.125 MG PO TABS
3.1250 mg | ORAL_TABLET | Freq: Two times a day (BID) | ORAL | Status: DC
  Administered 2018-01-25: 3.125 mg via ORAL
  Filled 2018-01-25: qty 1

## 2018-01-25 MED ORDER — SODIUM CHLORIDE 0.9 % IV SOLN
250.0000 mL | INTRAVENOUS | Status: DC | PRN
  Administered 2018-01-29: 250 mL via INTRAVENOUS

## 2018-01-25 NOTE — Progress Notes (Signed)
Patient with h/o CAD, CHF and afib on Coumadin presented to [facility] on 8/25.  I spoke to Dr. Posey Pronto, a hospitalist at [facility].  He was admitted with lower GI bleed.  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  Cardiology saw him and cleared him for surgery.  The surgeon planned to operate next week.  The family is concerned about his high cardiac risk and requested transfer.  He spoke with Dr. Molli Posey - they would be able to operate over the weekend or early next week.  He would like for the patient to be transferred to the [facility] service with cardiac clearance first.  Will accept the patient in transfer to a Med Surg bed at [facility] at this time.  Carlyon Shadow, M.D.

## 2018-01-25 NOTE — Progress Notes (Signed)
Subjective:  CC:  Luis Palmer is a 84 y.o. male  Hospital stay day 4, 2 Days Post-Op colonoscopy for colon CA  HPI: No issues. Tolerating diet, having BMs  ROS:  A 5 point review of systems was performed and pertinent positives and negatives noted in HPI.   Objective:      Temp:  [98.2 F (36.8 C)-98.4 F (36.9 C)] 98.2 F (36.8 C) (08/29 1313) Pulse Rate:  [78-90] 79 (08/29 1313) Resp:  [20-24] 24 (08/29 0436) BP: (113-125)/(68-76) 113/72 (08/29 1313) SpO2:  [97 %-100 %] 100 % (08/29 1313)     Height: 6' (182.9 cm) Weight: 99.8 kg BMI (Calculated): 29.83   Intake/Output this shift:  Total I/O In: 480 [P.O.:480] Out: -     Constitutional :  alert, cooperative, appears stated age and no distress  Respiratory:  clear to auscultation bilaterally  Cardiovascular:  regularly irregular rhythm  Gastrointestinal: soft, non-tender; bowel sounds normal; no masses,  no organomegaly.   Skin: Cool and moist.   Psychiatric: Normal affect, non-agitated, not confused       LABS:  CMP Latest Ref Rng & Units 01/25/2018 01/24/2018 01/23/2018  Glucose 70 - 99 mg/dL 155(H) 133(H) 205(H)  BUN 8 - 23 mg/dL 28(H) 29(H) 30(H)  Creatinine 0.61 - 1.24 mg/dL 1.60(H) 1.47(H) 1.53(H)  Sodium 135 - 145 mmol/L 139 139 138  Potassium 3.5 - 5.1 mmol/L 4.2 4.0 4.1  Chloride 98 - 111 mmol/L 110 114(H) 112(H)  CO2 22 - 32 mmol/L 20(L) 19(L) 18(L)  Calcium 8.9 - 10.3 mg/dL 8.2(L) 7.9(L) 8.1(L)  Total Protein 6.5 - 8.1 g/dL 6.0(L) - -  Total Bilirubin 0.3 - 1.2 mg/dL 1.2 - -  Alkaline Phos 38 - 126 U/L 46 - -  AST 15 - 41 U/L 37 - -  ALT 0 - 44 U/L 42 - -   CBC Latest Ref Rng & Units 01/25/2018 01/24/2018 01/23/2018  WBC 3.8 - 10.6 K/uL 8.7 7.2 8.5  Hemoglobin 13.0 - 18.0 g/dL 9.3(L) 8.7(L) 8.9(L)  Hematocrit 40.0 - 52.0 % 27.1(L) 25.6(L) 26.5(L)  Platelets 150 - 440 K/uL 127(L) 115(L) 129(L)  CEA- 8.3  RADS: CLINICAL DATA:  Hypotension and dizziness. Possible malignant tumor found in colon on  colonoscopy earlier today.  EXAM: CT CHEST, ABDOMEN, AND PELVIS WITH CONTRAST  TECHNIQUE: Multidetector CT imaging of the chest, abdomen and pelvis was performed following the standard protocol during bolus administration of intravenous contrast.  CONTRAST:  70mL OMNIPAQUE IOHEXOL 300 MG/ML  SOLN  COMPARISON:  CXR 12/29/2014  FINDINGS: CT CHEST FINDINGS  Cardiovascular: Conventional branch pattern of the great vessels with atherosclerosis. No significant stenosis. There is moderate atherosclerosis of the thoracic aorta without aneurysm. Left main and three-vessel coronary arteriosclerosis of the native coronary arteries identified with post CABG change. Left-sided ICD device with leads in the right atrium, right ventricle and coronary sinus. No pericardial effusion. There is cardiomegaly. No large central pulmonary embolus.  Mediastinum/Nodes: Small subcentimeter short axis lymph nodes within the mediastinum and both hila. Mild subcarinal lymphadenopathy up to 18 mm short axis. Trachea and mainstem bronchi are patent. Esophagus is unremarkable.  Lungs/Pleura: Small right greater than left pleural effusions. Patchy ground-glass opacities are noted throughout both lungs with mild peribronchial thickening. Findings are nonspecific but atypical pneumonia, alveolitis or pneumonitis are among some possibilities or stigmata of CHF given the small pleural effusions and cardiomegaly. No pneumothorax. No dominant mass is identified.  Musculoskeletal: No aggressive osseous lesions or fracture.  CT ABDOMEN  PELVIS FINDINGS  Hepatobiliary: Distended gallbladder with mural thickening and edema raising concern for cholecystitis. Sympathetic thickening from possible adjacent pancreatitis given peripancreatic edema is also a possibility. The liver is unremarkable and free of enhancing mass lesions. No biliary dilatation.  Pancreas: Peripancreatic edema is noted without  space-occupying mass of the pancreas or ductal dilatation. Pancreatitis is a possibility without complicating features.  Spleen: Splenic granulomas.  No splenomegaly.  Adrenals/Urinary Tract: Normal bilateral adrenal glands. Water attenuating cyst off the lower pole of the right kidney measuring 19 mm in diameter. No nephrolithiasis nor hydroureteronephrosis. The urinary bladder is unremarkable for the degree of distention.  Stomach/Bowel: Physiologic distention of the stomach with normal small bowel rotation and ligament of Treitz position. No small bowel obstruction or inflammation. Distal and terminal ileum are normal. Segmental thickening of the ascending colon is identified either representing neoplastic enlargement and thickening or focal inflammatory thickening suggest colitis. Clinical correlation with findings at colonoscopy recommended. Appendectomy.  Vascular/Lymphatic: Moderate aortoiliac and branch vessel atherosclerosis without aneurysm. Suboptimal opacification of the splenic and portal veins. Small porta hepatic subcentimeter short axis lymph nodes. No adenopathy by CT size criteria.  Reproductive: Enlarged prostate measuring 6.6 x 6 x 5.6 cm.  Other: Small fat containing left inguinal hernia. Small periumbilical fat containing hernia.  Musculoskeletal: No aggressive osseous lesions. Mild degenerative change along the thoracolumbar spine.  IMPRESSION: Chest CT:  1. Patchy ground-glass opacities throughout the visualized lungs potentially representing stigmata of CHF given cardiomegaly and small bilateral pleural effusions. Alveolitis or pneumonitis and/or potentially hypoventilatory change versus atypical pneumonia are some other possible considerations. No dominant mass. 2. Status post CABG with ICD device in place. Coronary arteriosclerosis of the native coronary arteries. 3.  Aortic atherosclerosis (ICD10-I70.0)  CT abdomen pelvis:  1.  Peripancreatic edema raising the possibility of uncomplicated acute pancreatitis. Correlate clinically. 2. Somewhat distended and thick-walled appearance of the gallbladder which could be sympathetic due to adjacent pancreatitis versus representing changes of acute cholecystitis. 3. Normal size spleen with splenic granulomata. 4. Water attenuating cyst off the lower pole the right kidney measuring 19 mm. 5. Abnormal segmental thickening of the ascending colon, neoplasm versus marked inflammatory change/colitis. 6. Enlarged prostate. 7. Moderate aortoiliac and branch vessel atherosclerosis without aneurysm. No adenopathy. 8. Small fat containing left inguinal hernia and periumbilical fat containing hernia.   Electronically Signed   By: Ashley Royalty M.D.   On: 01/23/2018 20:36 Assessment:   Colon CA -Pt and wife trying to arrange an earlier date with another surgeon due to their concerns of being off coumadin for an extended period of time. I offered them my contact info and informed them that I will keep my schedule open for 02/02/18, which was the earliest date I could accomodate them.  They will call office to set up procedure if they decide to stay with me.  Surgery will follow peripherally while inhouse.  Further management and arrangement of care per primary team.  Discussed with Dr. Posey Pronto, primary, and he verbalized understanding.

## 2018-01-25 NOTE — Progress Notes (Signed)
Patient and family informed of pending transfer. Verbalized understanding. No further questions. Spoke to carelink, report given. En route, ETA:20 mins. Report given to RN at [place], [place].

## 2018-01-25 NOTE — Consult Note (Signed)
[place]  [phone] [phone]  ID: MANOJ ENRIQUEZ OB: 11/12/1932  MR#: [id number]  ZMO#:[id number]  Patient Care Team: Idelle Crouch, MD as PCP - General (Internal Medicine)  CHIEF COMPLAINT: GI bleed with likely adenocarcinoma the colon.  INTERVAL HISTORY: Patient is an 84 year old male who was recently seen as an outpatient approximately 3 months ago for new onset iron deficiency anemia.  He was recently admitted to the hospital with increasing weakness and fatigue as well as GI bleed.  Subsequent colonoscopy revealed a large fungating mass in the transverse colon highly suspicious for underlying malignancy.  Patient feels mildly improved from admission, but not back to his baseline.  He has no neurologic complaints.  He denies any recent fevers or illnesses.  He has a good appetite and denies weight loss.  He has no chest pain or shortness of breath.  He denies any nausea, vomiting, constipation, or diarrhea.  He denies any further melena or hematochezia.  He has no urinary complaints.  Patient otherwise feels well and offers no further specific complaints.  REVIEW OF SYSTEMS:   Review of Systems  Constitutional: Positive for malaise/fatigue. Negative for fever and weight loss.  Respiratory: Negative.  Negative for cough and shortness of breath.   Cardiovascular: Negative.  Negative for chest pain and leg swelling.  Gastrointestinal: Positive for blood in stool. Negative for abdominal pain, constipation, diarrhea, melena, nausea and vomiting.  Genitourinary: Negative.  Negative for dysuria and hematuria.  Musculoskeletal: Negative.   Skin: Negative.  Negative for rash.  Neurological: Negative.  Negative for dizziness, focal weakness, weakness and headaches.  Psychiatric/Behavioral: Negative.  The patient is not nervous/anxious.     As per HPI. Otherwise, a complete review of systems is negative.  PAST MEDICAL HISTORY: Past Medical  History:  Diagnosis Date  . Anemia   . Atrial fibrillation ([place])   . CHF (congestive heart failure) ([place])   . Diabetes mellitus without complication ([place])   . Hx of CABG     PAST SURGICAL HISTORY: Past Surgical History:  Procedure Laterality Date  . ABLATION     X 2 FOR A-FIB  . APPENDECTOMY    . CARDIAC DEFIBRILLATOR PLACEMENT    . CARDIAC SURGERY    . COLONOSCOPY WITH PROPOFOL N/A 01/23/2018   Procedure: COLONOSCOPY WITH PROPOFOL;  Surgeon: Lucilla Lame, MD;  Location: [facility] ENDOSCOPY;  Service: Endoscopy;  Laterality: N/A;  . CORONARY ARTERY BYPASS GRAFT    . ESOPHAGOGASTRODUODENOSCOPY (EGD) WITH PROPOFOL N/A 01/22/2018   Procedure: ESOPHAGOGASTRODUODENOSCOPY (EGD) WITH PROPOFOL;  Surgeon: Lucilla Lame, MD;  Location: [facility] ENDOSCOPY;  Service: Endoscopy;  Laterality: N/A;  . INSERT / REPLACE / REMOVE PACEMAKER    . PACEMAKER IMPLANT      FAMILY HISTORY: Family History  Problem Relation Age of Onset  . Congestive Heart Failure Mother   . Diabetes Father   . Stroke Father   . Lung cancer Brother     ADVANCED DIRECTIVES (Y/N):  @ADVDIR @  HEALTH MAINTENANCE: Social History   Tobacco Use  . Smoking status: Former Smoker    Packs/day: 1.00    Years: 40.00    Pack years: 40.00    Types: Cigarettes    Last attempt to quit: 1986    Years since quitting: 33.6  . Smokeless tobacco: Never Used  Substance Use Topics  . Alcohol use: No  . Drug use: Not Currently     Colonoscopy:  PAP:  Bone density:  Lipid panel:  Allergies  Allergen Reactions  . Pseudoephedrine Hcl Other (See Comments)    Other Reaction: ANURIA  . Ace Inhibitors Cough and Other (See Comments)    cough cough   . Nsaids Other (See Comments)    Reaction:  Fluid retention   . Vasotec [Enalapril] Cough    No current facility-administered medications for this encounter.    No current outpatient medications on file.   Facility-Administered Medications Ordered in Other Encounters  Medication  Dose Route Frequency Provider Last Rate Last Dose  . 0.9 %  sodium chloride infusion  250 mL Intravenous PRN Roney Jaffe, MD      . acetaminophen (TYLENOL) tablet 650 mg  650 mg Oral Q6H PRN Roney Jaffe, MD       Or  . acetaminophen (TYLENOL) suppository 650 mg  650 mg Rectal Q6H PRN Roney Jaffe, MD      . cholecalciferol (VITAMIN D) tablet 2,000 Units  2,000 Units Oral Daily Roney Jaffe, MD      . donepezil (ARICEPT) tablet 10 mg  10 mg Oral QHS Roney Jaffe, MD      . ezetimibe (ZETIA) tablet 10 mg  10 mg Oral QHS Roney Jaffe, MD      . furosemide (LASIX) injection 40 mg  40 mg Intravenous BID Roney Jaffe, MD   40 mg at 01/25/18 2032  . insulin aspart (novoLOG) injection 0-5 Units  0-5 Units Subcutaneous QHS Roney Jaffe, MD      . Derrill Memo ON 01/26/2018] insulin aspart (novoLOG) injection 0-9 Units  0-9 Units Subcutaneous TID WC Roney Jaffe, MD      . ondansetron [facility]) tablet 4 mg  4 mg Oral Q6H PRN Roney Jaffe, MD       Or  . ondansetron (ZOFRAN) injection 4 mg  4 mg Intravenous Q6H PRN Roney Jaffe, MD      . pantoprazole (PROTONIX) EC tablet 40 mg  40 mg Oral BID Roney Jaffe, MD      . rosuvastatin (CRESTOR) tablet 10 mg  10 mg Oral Daily Roney Jaffe, MD      . sodium chloride flush (NS) 0.9 % injection 3 mL  3 mL Intravenous Q12H Roney Jaffe, MD      . sodium chloride flush (NS) 0.9 % injection 3 mL  3 mL Intravenous PRN Roney Jaffe, MD        OBJECTIVE: There were no vitals filed for this visit.   There is no height or weight on file to calculate BMI.    ECOG FS:2 - Symptomatic, <50% confined to bed  General: Well-developed, well-nourished, no acute distress. Eyes: Pink conjunctiva, anicteric sclera. HEENT: Normocephalic, moist mucous membranes, clear oropharnyx. Lungs: Clear to auscultation bilaterally. Heart: Regular rate and rhythm. No rubs, murmurs, or gallops. Abdomen: Soft, nontender, nondistended. No organomegaly  noted, normoactive bowel sounds. Musculoskeletal: No edema, cyanosis, or clubbing. Neuro: Alert, answering all questions appropriately. Cranial nerves grossly intact. Skin: No rashes or petechiae noted. Psych: Normal affect. Lymphatics: No cervical, calvicular, axillary or inguinal LAD.   LAB RESULTS:  Lab Results  Component Value Date   NA 139 01/25/2018   K 4.2 01/25/2018   CL 110 01/25/2018   CO2 20 (L) 01/25/2018   GLUCOSE 155 (H) 01/25/2018   BUN 28 (H) 01/25/2018   CREATININE 1.60 (H) 01/25/2018   CALCIUM 8.2 (L) 01/25/2018   PROT 6.0 (L) 01/25/2018   ALBUMIN 2.9 (L) 01/25/2018   AST 37 01/25/2018   ALT 42 01/25/2018   ALKPHOS 46 01/25/2018  BILITOT 1.2 01/25/2018   GFRNONAA 38 (L) 01/25/2018   GFRAA 44 (L) 01/25/2018    Lab Results  Component Value Date   WBC 8.7 01/25/2018   NEUTROABS 2.9 12/06/2017   HGB 9.3 (L) 01/25/2018   HCT 27.1 (L) 01/25/2018   MCV 85.0 01/25/2018   PLT 127 (L) 01/25/2018     STUDIES: Ct Chest W Contrast  Result Date: 01/23/2018 CLINICAL DATA:  Hypotension and dizziness. Possible malignant tumor found in colon on colonoscopy earlier today. EXAM: CT CHEST, ABDOMEN, AND PELVIS WITH CONTRAST TECHNIQUE: Multidetector CT imaging of the chest, abdomen and pelvis was performed following the standard protocol during bolus administration of intravenous contrast. CONTRAST:  53mL OMNIPAQUE IOHEXOL 300 MG/ML  SOLN COMPARISON:  CXR 12/29/2014 FINDINGS: CT CHEST FINDINGS Cardiovascular: Conventional branch pattern of the great vessels with atherosclerosis. No significant stenosis. There is moderate atherosclerosis of the thoracic aorta without aneurysm. Left main and three-vessel coronary arteriosclerosis of the native coronary arteries identified with post CABG change. Left-sided ICD device with leads in the right atrium, right ventricle and coronary sinus. No pericardial effusion. There is cardiomegaly. No large central pulmonary embolus.  Mediastinum/Nodes: Small subcentimeter short axis lymph nodes within the mediastinum and both hila. Mild subcarinal lymphadenopathy up to 18 mm short axis. Trachea and mainstem bronchi are patent. Esophagus is unremarkable. Lungs/Pleura: Small right greater than left pleural effusions. Patchy ground-glass opacities are noted throughout both lungs with mild peribronchial thickening. Findings are nonspecific but atypical pneumonia, alveolitis or pneumonitis are among some possibilities or stigmata of CHF given the small pleural effusions and cardiomegaly. No pneumothorax. No dominant mass is identified. Musculoskeletal: No aggressive osseous lesions or fracture. CT ABDOMEN PELVIS FINDINGS Hepatobiliary: Distended gallbladder with mural thickening and edema raising concern for cholecystitis. Sympathetic thickening from possible adjacent pancreatitis given peripancreatic edema is also a possibility. The liver is unremarkable and free of enhancing mass lesions. No biliary dilatation. Pancreas: Peripancreatic edema is noted without space-occupying mass of the pancreas or ductal dilatation. Pancreatitis is a possibility without complicating features. Spleen: Splenic granulomas.  No splenomegaly. Adrenals/Urinary Tract: Normal bilateral adrenal glands. Water attenuating cyst off the lower pole of the right kidney measuring 19 mm in diameter. No nephrolithiasis nor hydroureteronephrosis. The urinary bladder is unremarkable for the degree of distention. Stomach/Bowel: Physiologic distention of the stomach with normal small bowel rotation and ligament of Treitz position. No small bowel obstruction or inflammation. Distal and terminal ileum are normal. Segmental thickening of the ascending colon is identified either representing neoplastic enlargement and thickening or focal inflammatory thickening suggest colitis. Clinical correlation with findings at colonoscopy recommended. Appendectomy. Vascular/Lymphatic: Moderate  aortoiliac and branch vessel atherosclerosis without aneurysm. Suboptimal opacification of the splenic and portal veins. Small porta hepatic subcentimeter short axis lymph nodes. No adenopathy by CT size criteria. Reproductive: Enlarged prostate measuring 6.6 x 6 x 5.6 cm. Other: Small fat containing left inguinal hernia. Small periumbilical fat containing hernia. Musculoskeletal: No aggressive osseous lesions. Mild degenerative change along the thoracolumbar spine. IMPRESSION: Chest CT: 1. Patchy ground-glass opacities throughout the visualized lungs potentially representing stigmata of CHF given cardiomegaly and small bilateral pleural effusions. Alveolitis or pneumonitis and/or potentially hypoventilatory change versus atypical pneumonia are some other possible considerations. No dominant mass. 2. Status post CABG with ICD device in place. Coronary arteriosclerosis of the native coronary arteries. 3.  Aortic atherosclerosis (ICD10-I70.0) CT abdomen pelvis: 1. Peripancreatic edema raising the possibility of uncomplicated acute pancreatitis. Correlate clinically. 2. Somewhat distended and thick-walled appearance of the gallbladder  which could be sympathetic due to adjacent pancreatitis versus representing changes of acute cholecystitis. 3. Normal size spleen with splenic granulomata. 4. Water attenuating cyst off the lower pole the right kidney measuring 19 mm. 5. Abnormal segmental thickening of the ascending colon, neoplasm versus marked inflammatory change/colitis. 6. Enlarged prostate. 7. Moderate aortoiliac and branch vessel atherosclerosis without aneurysm. No adenopathy. 8. Small fat containing left inguinal hernia and periumbilical fat containing hernia. Electronically Signed   By: Ashley Royalty M.D.   On: 01/23/2018 20:36   Ct Abdomen Pelvis W Contrast  Result Date: 01/23/2018 CLINICAL DATA:  Hypotension and dizziness. Possible malignant tumor found in colon on colonoscopy earlier today. EXAM: CT CHEST,  ABDOMEN, AND PELVIS WITH CONTRAST TECHNIQUE: Multidetector CT imaging of the chest, abdomen and pelvis was performed following the standard protocol during bolus administration of intravenous contrast. CONTRAST:  60mL OMNIPAQUE IOHEXOL 300 MG/ML  SOLN COMPARISON:  CXR 12/29/2014 FINDINGS: CT CHEST FINDINGS Cardiovascular: Conventional branch pattern of the great vessels with atherosclerosis. No significant stenosis. There is moderate atherosclerosis of the thoracic aorta without aneurysm. Left main and three-vessel coronary arteriosclerosis of the native coronary arteries identified with post CABG change. Left-sided ICD device with leads in the right atrium, right ventricle and coronary sinus. No pericardial effusion. There is cardiomegaly. No large central pulmonary embolus. Mediastinum/Nodes: Small subcentimeter short axis lymph nodes within the mediastinum and both hila. Mild subcarinal lymphadenopathy up to 18 mm short axis. Trachea and mainstem bronchi are patent. Esophagus is unremarkable. Lungs/Pleura: Small right greater than left pleural effusions. Patchy ground-glass opacities are noted throughout both lungs with mild peribronchial thickening. Findings are nonspecific but atypical pneumonia, alveolitis or pneumonitis are among some possibilities or stigmata of CHF given the small pleural effusions and cardiomegaly. No pneumothorax. No dominant mass is identified. Musculoskeletal: No aggressive osseous lesions or fracture. CT ABDOMEN PELVIS FINDINGS Hepatobiliary: Distended gallbladder with mural thickening and edema raising concern for cholecystitis. Sympathetic thickening from possible adjacent pancreatitis given peripancreatic edema is also a possibility. The liver is unremarkable and free of enhancing mass lesions. No biliary dilatation. Pancreas: Peripancreatic edema is noted without space-occupying mass of the pancreas or ductal dilatation. Pancreatitis is a possibility without complicating features.  Spleen: Splenic granulomas.  No splenomegaly. Adrenals/Urinary Tract: Normal bilateral adrenal glands. Water attenuating cyst off the lower pole of the right kidney measuring 19 mm in diameter. No nephrolithiasis nor hydroureteronephrosis. The urinary bladder is unremarkable for the degree of distention. Stomach/Bowel: Physiologic distention of the stomach with normal small bowel rotation and ligament of Treitz position. No small bowel obstruction or inflammation. Distal and terminal ileum are normal. Segmental thickening of the ascending colon is identified either representing neoplastic enlargement and thickening or focal inflammatory thickening suggest colitis. Clinical correlation with findings at colonoscopy recommended. Appendectomy. Vascular/Lymphatic: Moderate aortoiliac and branch vessel atherosclerosis without aneurysm. Suboptimal opacification of the splenic and portal veins. Small porta hepatic subcentimeter short axis lymph nodes. No adenopathy by CT size criteria. Reproductive: Enlarged prostate measuring 6.6 x 6 x 5.6 cm. Other: Small fat containing left inguinal hernia. Small periumbilical fat containing hernia. Musculoskeletal: No aggressive osseous lesions. Mild degenerative change along the thoracolumbar spine. IMPRESSION: Chest CT: 1. Patchy ground-glass opacities throughout the visualized lungs potentially representing stigmata of CHF given cardiomegaly and small bilateral pleural effusions. Alveolitis or pneumonitis and/or potentially hypoventilatory change versus atypical pneumonia are some other possible considerations. No dominant mass. 2. Status post CABG with ICD device in place. Coronary arteriosclerosis of the native coronary arteries.  3.  Aortic atherosclerosis (ICD10-I70.0) CT abdomen pelvis: 1. Peripancreatic edema raising the possibility of uncomplicated acute pancreatitis. Correlate clinically. 2. Somewhat distended and thick-walled appearance of the gallbladder which could be  sympathetic due to adjacent pancreatitis versus representing changes of acute cholecystitis. 3. Normal size spleen with splenic granulomata. 4. Water attenuating cyst off the lower pole the right kidney measuring 19 mm. 5. Abnormal segmental thickening of the ascending colon, neoplasm versus marked inflammatory change/colitis. 6. Enlarged prostate. 7. Moderate aortoiliac and branch vessel atherosclerosis without aneurysm. No adenopathy. 8. Small fat containing left inguinal hernia and periumbilical fat containing hernia. Electronically Signed   By: Ashley Royalty M.D.   On: 01/23/2018 20:36    ASSESSMENT: GI bleed with likely adenocarcinoma the colon.  PLAN:    1.  Likely adenocarcinoma the colon: Appreciate GI input.  Colonoscopy images and results reviewed.  CT results reviewed independently report as above with no obvious evidence of metastatic disease.  Final pathology is pending.  CEA mildly elevated at 8.3.  Patient has requested transfer to [facility] in order to have surgical intervention sooner.  Patient has follow-up on February 21, 2018 at the [facility] to discuss his final pathology results and any treatment planning necessary. 2.  GI bleed: Likely secondary to underlying malignancy.  Patient's most recent hemoglobin had improved to 9.3 with blood transfusion.  No further intervention needed, patient does not require transfusion at this time.  Continue to monitor. 3.  Atrial fibrillation: Patient was previously on anticoagulation, but this has been discontinued secondary to GI bleed.  Monitor. 4.  Disposition: Transfer to [facility] today.  Follow-up in the [place] as above.  Appreciate consult, call with questions.   Lloyd Huger, MD   01/25/2018 10:51 PM

## 2018-01-25 NOTE — Progress Notes (Signed)
cpap at bedside on standby. Patient states he just took off. He declined to wear at this time.

## 2018-01-25 NOTE — Progress Notes (Signed)
[facility] [place] NAME: Luis Palmer    MR#:  [id number]  DATE OF BIRTH:  Mar 29, 1933  SUBJECTIVE:  CHIEF COMPLAINT: Patient denies any complaints Soon as I went into the room , patient and his wife were concerned about his surgery they initially asked me to call [facility] for transfer, then subsequently I called [facility] for transfer    REVIEW OF SYSTEMS:  CONSTITUTIONAL: No fever, fatigue or weakness.  EYES: No blurred or double vision.  EARS, NOSE, AND THROAT: No tinnitus or ear pain.  RESPIRATORY: No cough, shortness of breath, wheezing or hemoptysis.  CARDIOVASCULAR: No chest pain, orthopnea, edema.  GASTROINTESTINAL: No nausea, vomiting, diarrhea or abdominal pain.  GENITOURINARY: No dysuria, hematuria.  ENDOCRINE: No polyuria, nocturia,  HEMATOLOGY: No anemia, easy bruising or bleeding SKIN: No rash or lesion. MUSCULOSKELETAL: No joint pain or arthritis.   NEUROLOGIC: No tingling, numbness, weakness.  PSYCHIATRY: No anxiety or depression.   DRUG ALLERGIES:   Allergies  Allergen Reactions  . Pseudoephedrine Hcl Other (See Comments)    Other Reaction: ANURIA  . Ace Inhibitors Cough and Other (See Comments)    cough cough   . Other Other (See Comments)    Other Reaction: fluid retention Other Reaction: fluid retention  . Nsaids Other (See Comments)    Reaction:  Fluid retention   . Sudafed [Pseudoephedrine Hcl] Other (See Comments)    Pt states that it messes up his prostate.    Michail Sermon [Enalapril] Cough    VITALS:  Blood pressure 113/72, pulse 79, temperature 98.2 F (36.8 C), temperature source Oral, resp. rate (!) 24, height 6' (1.829 m), weight 99.8 kg, SpO2 100 %.  PHYSICAL EXAMINATION:  GENERAL:  84 y.o.-year-old patient lying in the bed with no acute distress.  EYES: Pupils equal, round, reactive to light and accommodation. No scleral icterus. Extraocular muscles intact.  HEENT: Head  atraumatic, normocephalic. Oropharynx and nasopharynx clear.  NECK:  Supple, no jugular venous distention. No thyroid enlargement, no tenderness.  LUNGS: Normal breath sounds bilaterally, no wheezing, rales,rhonchi or crepitation. No use of accessory muscles of respiration.  CARDIOVASCULAR: S1, S2 normal. No murmurs, rubs, or gallops.  ABDOMEN: Soft, nontender, nondistended. Bowel sounds present.  EXTREMITIES: No pedal edema, cyanosis, or clubbing.  NEUROLOGIC: Cranial nerves II through XII are intact. Muscle strength 5/5 in all extremities. Sensation intact. Gait not checked.  PSYCHIATRIC: The patient is alert and oriented x 3.  SKIN: No obvious rash, lesion, or ulcer.    LABORATORY PANEL:   CBC Recent Labs  Lab 01/25/18 0508  WBC 8.7  HGB 9.3*  HCT 27.1*  PLT 127*   ------------------------------------------------------------------------------------------------------------------  Chemistries  Recent Labs  Lab 01/25/18 0508  NA 139  K 4.2  CL 110  CO2 20*  GLUCOSE 155*  BUN 28*  CREATININE 1.60*  CALCIUM 8.2*  AST 37  ALT 42  ALKPHOS 46  BILITOT 1.2   ------------------------------------------------------------------------------------------------------------------  Cardiac Enzymes No results for input(s): TROPONINI in the last 168 hours. ------------------------------------------------------------------------------------------------------------------  RADIOLOGY:  Ct Chest W Contrast  Result Date: 01/23/2018 CLINICAL DATA:  Hypotension and dizziness. Possible malignant tumor found in colon on colonoscopy earlier today. EXAM: CT CHEST, ABDOMEN, AND PELVIS WITH CONTRAST TECHNIQUE: Multidetector CT imaging of the chest, abdomen and pelvis was performed following the standard protocol during bolus administration of intravenous contrast. CONTRAST:  66mL OMNIPAQUE IOHEXOL 300 MG/ML  SOLN COMPARISON:  CXR 12/29/2014 FINDINGS: CT CHEST FINDINGS Cardiovascular:  Conventional  branch pattern of the great vessels with atherosclerosis. No significant stenosis. There is moderate atherosclerosis of the thoracic aorta without aneurysm. Left main and three-vessel coronary arteriosclerosis of the native coronary arteries identified with post CABG change. Left-sided ICD device with leads in the right atrium, right ventricle and coronary sinus. No pericardial effusion. There is cardiomegaly. No large central pulmonary embolus. Mediastinum/Nodes: Small subcentimeter short axis lymph nodes within the mediastinum and both hila. Mild subcarinal lymphadenopathy up to 18 mm short axis. Trachea and mainstem bronchi are patent. Esophagus is unremarkable. Lungs/Pleura: Small right greater than left pleural effusions. Patchy ground-glass opacities are noted throughout both lungs with mild peribronchial thickening. Findings are nonspecific but atypical pneumonia, alveolitis or pneumonitis are among some possibilities or stigmata of CHF given the small pleural effusions and cardiomegaly. No pneumothorax. No dominant mass is identified. Musculoskeletal: No aggressive osseous lesions or fracture. CT ABDOMEN PELVIS FINDINGS Hepatobiliary: Distended gallbladder with mural thickening and edema raising concern for cholecystitis. Sympathetic thickening from possible adjacent pancreatitis given peripancreatic edema is also a possibility. The liver is unremarkable and free of enhancing mass lesions. No biliary dilatation. Pancreas: Peripancreatic edema is noted without space-occupying mass of the pancreas or ductal dilatation. Pancreatitis is a possibility without complicating features. Spleen: Splenic granulomas.  No splenomegaly. Adrenals/Urinary Tract: Normal bilateral adrenal glands. Water attenuating cyst off the lower pole of the right kidney measuring 19 mm in diameter. No nephrolithiasis nor hydroureteronephrosis. The urinary bladder is unremarkable for the degree of distention. Stomach/Bowel: Physiologic  distention of the stomach with normal small bowel rotation and ligament of Treitz position. No small bowel obstruction or inflammation. Distal and terminal ileum are normal. Segmental thickening of the ascending colon is identified either representing neoplastic enlargement and thickening or focal inflammatory thickening suggest colitis. Clinical correlation with findings at colonoscopy recommended. Appendectomy. Vascular/Lymphatic: Moderate aortoiliac and branch vessel atherosclerosis without aneurysm. Suboptimal opacification of the splenic and portal veins. Small porta hepatic subcentimeter short axis lymph nodes. No adenopathy by CT size criteria. Reproductive: Enlarged prostate measuring 6.6 x 6 x 5.6 cm. Other: Small fat containing left inguinal hernia. Small periumbilical fat containing hernia. Musculoskeletal: No aggressive osseous lesions. Mild degenerative change along the thoracolumbar spine. IMPRESSION: Chest CT: 1. Patchy ground-glass opacities throughout the visualized lungs potentially representing stigmata of CHF given cardiomegaly and small bilateral pleural effusions. Alveolitis or pneumonitis and/or potentially hypoventilatory change versus atypical pneumonia are some other possible considerations. No dominant mass. 2. Status post CABG with ICD device in place. Coronary arteriosclerosis of the native coronary arteries. 3.  Aortic atherosclerosis (ICD10-I70.0) CT abdomen pelvis: 1. Peripancreatic edema raising the possibility of uncomplicated acute pancreatitis. Correlate clinically. 2. Somewhat distended and thick-walled appearance of the gallbladder which could be sympathetic due to adjacent pancreatitis versus representing changes of acute cholecystitis. 3. Normal size spleen with splenic granulomata. 4. Water attenuating cyst off the lower pole the right kidney measuring 19 mm. 5. Abnormal segmental thickening of the ascending colon, neoplasm versus marked inflammatory change/colitis. 6.  Enlarged prostate. 7. Moderate aortoiliac and branch vessel atherosclerosis without aneurysm. No adenopathy. 8. Small fat containing left inguinal hernia and periumbilical fat containing hernia. Electronically Signed   By: Ashley Royalty M.D.   On: 01/23/2018 20:36   Ct Abdomen Pelvis W Contrast  Result Date: 01/23/2018 CLINICAL DATA:  Hypotension and dizziness. Possible malignant tumor found in colon on colonoscopy earlier today. EXAM: CT CHEST, ABDOMEN, AND PELVIS WITH CONTRAST TECHNIQUE: Multidetector CT imaging of the chest, abdomen and pelvis  was performed following the standard protocol during bolus administration of intravenous contrast. CONTRAST:  35mL OMNIPAQUE IOHEXOL 300 MG/ML  SOLN COMPARISON:  CXR 12/29/2014 FINDINGS: CT CHEST FINDINGS Cardiovascular: Conventional branch pattern of the great vessels with atherosclerosis. No significant stenosis. There is moderate atherosclerosis of the thoracic aorta without aneurysm. Left main and three-vessel coronary arteriosclerosis of the native coronary arteries identified with post CABG change. Left-sided ICD device with leads in the right atrium, right ventricle and coronary sinus. No pericardial effusion. There is cardiomegaly. No large central pulmonary embolus. Mediastinum/Nodes: Small subcentimeter short axis lymph nodes within the mediastinum and both hila. Mild subcarinal lymphadenopathy up to 18 mm short axis. Trachea and mainstem bronchi are patent. Esophagus is unremarkable. Lungs/Pleura: Small right greater than left pleural effusions. Patchy ground-glass opacities are noted throughout both lungs with mild peribronchial thickening. Findings are nonspecific but atypical pneumonia, alveolitis or pneumonitis are among some possibilities or stigmata of CHF given the small pleural effusions and cardiomegaly. No pneumothorax. No dominant mass is identified. Musculoskeletal: No aggressive osseous lesions or fracture. CT ABDOMEN PELVIS FINDINGS Hepatobiliary:  Distended gallbladder with mural thickening and edema raising concern for cholecystitis. Sympathetic thickening from possible adjacent pancreatitis given peripancreatic edema is also a possibility. The liver is unremarkable and free of enhancing mass lesions. No biliary dilatation. Pancreas: Peripancreatic edema is noted without space-occupying mass of the pancreas or ductal dilatation. Pancreatitis is a possibility without complicating features. Spleen: Splenic granulomas.  No splenomegaly. Adrenals/Urinary Tract: Normal bilateral adrenal glands. Water attenuating cyst off the lower pole of the right kidney measuring 19 mm in diameter. No nephrolithiasis nor hydroureteronephrosis. The urinary bladder is unremarkable for the degree of distention. Stomach/Bowel: Physiologic distention of the stomach with normal small bowel rotation and ligament of Treitz position. No small bowel obstruction or inflammation. Distal and terminal ileum are normal. Segmental thickening of the ascending colon is identified either representing neoplastic enlargement and thickening or focal inflammatory thickening suggest colitis. Clinical correlation with findings at colonoscopy recommended. Appendectomy. Vascular/Lymphatic: Moderate aortoiliac and branch vessel atherosclerosis without aneurysm. Suboptimal opacification of the splenic and portal veins. Small porta hepatic subcentimeter short axis lymph nodes. No adenopathy by CT size criteria. Reproductive: Enlarged prostate measuring 6.6 x 6 x 5.6 cm. Other: Small fat containing left inguinal hernia. Small periumbilical fat containing hernia. Musculoskeletal: No aggressive osseous lesions. Mild degenerative change along the thoracolumbar spine. IMPRESSION: Chest CT: 1. Patchy ground-glass opacities throughout the visualized lungs potentially representing stigmata of CHF given cardiomegaly and small bilateral pleural effusions. Alveolitis or pneumonitis and/or potentially hypoventilatory  change versus atypical pneumonia are some other possible considerations. No dominant mass. 2. Status post CABG with ICD device in place. Coronary arteriosclerosis of the native coronary arteries. 3.  Aortic atherosclerosis (ICD10-I70.0) CT abdomen pelvis: 1. Peripancreatic edema raising the possibility of uncomplicated acute pancreatitis. Correlate clinically. 2. Somewhat distended and thick-walled appearance of the gallbladder which could be sympathetic due to adjacent pancreatitis versus representing changes of acute cholecystitis. 3. Normal size spleen with splenic granulomata. 4. Water attenuating cyst off the lower pole the right kidney measuring 19 mm. 5. Abnormal segmental thickening of the ascending colon, neoplasm versus marked inflammatory change/colitis. 6. Enlarged prostate. 7. Moderate aortoiliac and branch vessel atherosclerosis without aneurysm. No adenopathy. 8. Small fat containing left inguinal hernia and periumbilical fat containing hernia. Electronically Signed   By: Ashley Royalty M.D.   On: 01/23/2018 20:36    EKG:   Orders placed or performed during the hospital encounter of 01/21/18  .  ED EKG  . ED EKG  . EKG  . EKG 12-Lead  . EKG 12-Lead    ASSESSMENT AND PLAN:   Patient is 84 year old with history of coronary artery disease atrial fibrillation on chronic Coumadin with bright red blood per rectum  #  Acute lower GI bleed this is due to adenocarcinoma of the right colon Patient will need surgery   #Abnormal CT scan with findings on the pancreas and gallbladder likely due to edema no evidence of acute cholecystitis or acute pancreatitis no further treatment needed  #Abnormal CT scan of the chest likely due to low-grade CHF no evidence of infection procalcitonin level normal   #colon Mass-due to adenocarcinoma Patient has been cleared by cardiology no further work-up needed I discussed with Dr.Matt Harolyn Rutherford I also discussed the case with surgeon at [facility] I spoke to the surgeon here Dr. Lysle Pearl who states that he will discuss with the patient's family regarding further treatment and care guarding his colon mass   #AKI  IV fluids, avoid nephrotoxins, monitor renal function Creatinine 1.31-1.53--1.47  #.  Hypotension blood pressure currently improved give IV fluids monitor blood pressure hold antihypertensive medication  #.  Hyperlipidemia continue Zetia  #  Diabetes type 2 Hold Metformin continue sliding scale insulin for now  #.   Chronic atrial fibrillation rate controlled Coumadin on hold in view of GI bleed  this will not be restarted until the: Mass is taken care of due to risk of rebleed  Miscellaneous SCDs for DVT prophylaxis       All the records are reviewed and case discussed with Care Management/Social Workerr. Management plans discussed with the patient, family and they are in agreement.  CODE STATUS:   TOTAL TIME TAKING CARE OF THIS PATIENT: 120 minutes spent coordinating this patient's care 1115am to 115pm, extensive time spent coordinating patient's care   Note: This dictation was prepared with Dragon dictation along with smaller phrase technology. Any transcriptional errors that result from this process are unintentional.   Dustin Flock M.D on 01/25/2018 at 2:22 PM  Between 7am to 6pm - Pager - [phone] After 6pm go to [email or web address] - password [email or web address] [facility]  Office  [phone]  CC: Primary care physician; Idelle Crouch, MD

## 2018-01-25 NOTE — H&P (Addendum)
[facility] History and Physical  Luis Palmer PRF:[id number] DOB: [birth year] DOA: 01/25/2018  Referring physician: Dr Dustin Flock PCP: Idelle Crouch, MD   Chief Complaint: GIB transfer from [facility]  HPI: Luis Palmer is a 84 y.o. male w/ hx of severe ICM sp ICD, afib ablation x 2, CAD sp CABG, chron systolic CHF, chron afib admitted to [facility] on 01/21/18 w/ BRBPR of acute onset.  rec'd [facility] to reverse his anticoagulation and was transfused PRBC's.  Seen by GI who did EGD showing gastritis, and colonscopy showing colon mass in the ascending colon. Mass biopsy showed adenoCa of the colon.  Surgery recommended resection and pt was seen by cardiology and cleared for surgery.  CT of chest showed "CHF" which is chronic reportedly for this pt. CT also showed some fluid around the pancreas and the GB prob related to fluid overload.  The patinet and family requested transfer to [facility] for surgery due to their concerns about his heart disease. Dr Georgette Dover accepted the patient for surgery and hospitalist team at [facility] accepted the patient for transfer/ admission.   Patient's main c/o is DOE getting OOB and in bed, no cough or orthopnea or PND.  Pure prog notes pt got IVF's for low BP's and BP meds were held.  Coumadin is on hold also since admit on 8/25 due to GIB.    Pt's wife states he had "blow out" BM x 2 just after admission on 8/25 and it "looked bloody", mixed in w/ stool.  No active bloody stools since admission.  No cough or fevers, no abd pain, appetite OK.       Old chart: - Feb 2015 > bilat PNA and decomp CHF rx'd IV abx, O2 and diuresis.  AMS resolved. Chron syst CHF, hx CABG, ICM, chron afib.     ROS  denies CP  no joint pain   no HA  no blurry vision  no rash  no diarrhea  no nausea/ vomiting  no dysuria  no difficulty voiding  no change in urine color    Past Medical History  Past Medical History:  Diagnosis Date  . Anemia   . Atrial fibrillation ([place])   .  CHF (congestive heart failure) ([place])   . Diabetes mellitus without complication ([place])   . Hx of CABG    Past Surgical History  Past Surgical History:  Procedure Laterality Date  . ABLATION     X 2 FOR A-FIB  . APPENDECTOMY    . CARDIAC DEFIBRILLATOR PLACEMENT    . CARDIAC SURGERY    . COLONOSCOPY WITH PROPOFOL N/A 01/23/2018   Procedure: COLONOSCOPY WITH PROPOFOL;  Surgeon: Lucilla Lame, MD;  Location: [facility] ENDOSCOPY;  Service: Endoscopy;  Laterality: N/A;  . CORONARY ARTERY BYPASS GRAFT    . ESOPHAGOGASTRODUODENOSCOPY (EGD) WITH PROPOFOL N/A 01/22/2018   Procedure: ESOPHAGOGASTRODUODENOSCOPY (EGD) WITH PROPOFOL;  Surgeon: Lucilla Lame, MD;  Location: [facility] ENDOSCOPY;  Service: Endoscopy;  Laterality: N/A;  . INSERT / REPLACE / REMOVE PACEMAKER    . PACEMAKER IMPLANT     Family History  Family History  Problem Relation Age of Onset  . Congestive Heart Failure Mother   . Diabetes Father   . Stroke Father   . Lung cancer Brother    Social History  reports that he quit smoking about 33 years ago. His smoking use included cigarettes. He has a 40.00 pack-year smoking history. He has never used smokeless tobacco. He reports that he has current or  past drug history. He reports that he does not drink alcohol. Allergies  Allergies  Allergen Reactions  . Pseudoephedrine Hcl Other (See Comments)    Other Reaction: ANURIA  . Ace Inhibitors Cough and Other (See Comments)    cough cough   . Other Other (See Comments)    Other Reaction: fluid retention Other Reaction: fluid retention  . Nsaids Other (See Comments)    Reaction:  Fluid retention   . Sudafed [Pseudoephedrine Hcl] Other (See Comments)    Pt states that it messes up his prostate.    Michail Sermon [Enalapril] Cough   Home medications Prior to Admission medications   Medication Sig Start Date End Date Taking? Authorizing Provider  carvedilol (COREG) 12.5 MG tablet Take 12.5 mg by mouth 2 (two) times daily.    [provider]  Cholecalciferol (VITAMIN D) 2000 UNITS CAPS Take 1 capsule by mouth daily.    [provider]  donepezil (ARICEPT) 10 MG tablet Take 10 mg by mouth at bedtime.    [provider]  ezetimibe (ZETIA) 10 MG tablet Take 10 mg by mouth at bedtime.    [provider]  furosemide (LASIX) 40 MG tablet Take 40 mg by mouth daily.    [provider]  losartan (COZAAR) 25 MG tablet Take 25 mg by mouth daily. 01/04/18   [provider]  meclizine (ANTIVERT) 25 MG tablet Take 25 mg by mouth daily.     [provider]  pantoprazole (PROTONIX) 40 MG tablet Take 1 tablet (40 mg total) by mouth 2 (two) times daily. 01/25/18   Dustin Flock, MD  rosuvastatin (CRESTOR) 10 MG tablet Take 10 mg by mouth daily. 12/30/17   [provider]   Liver Function Tests Recent Labs  Lab 01/25/18 0508  AST 37  ALT 42  ALKPHOS 46  BILITOT 1.2  PROT 6.0*  ALBUMIN 2.9*   Recent Labs  Lab 01/25/18 0508  LIPASE 47   CBC Recent Labs  Lab 01/23/18 0418 01/24/18 0337 01/25/18 0508  WBC 8.5 7.2 8.7  HGB 8.9* 8.7* 9.3*  HCT 26.5* 25.6* 27.1*  MCV 84.0 83.7 85.0  PLT 129* 115* 295*   Basic Metabolic Panel Recent Labs  Lab 01/21/18 1015 01/22/18 0307 01/23/18 0418 01/24/18 0337 01/25/18 0508  NA 137 139 138 139 139  K 4.2 4.0 4.1 4.0 4.2  CL 106 112* 112* 114* 110  CO2 23 23 18* 19* 20*  GLUCOSE 161* 106* 205* 133* 155*  BUN 41* 33* 30* 29* 28*  CREATININE 1.44* 1.31* 1.53* 1.47* 1.60*  CALCIUM 8.8* 8.3* 8.1* 7.9* 8.2*     Vitals:   01/25/18 1826  BP: 115/74  Pulse: 73  Resp: 20  Temp: 98.1 F (36.7 C)  TempSrc: Oral  SpO2: 100%   Exam: Gen alert, pale, chron ill apperaring, no distress, pleasant, tired No rash, cyanosis or gangrene Sclera anicteric, throat clear  +JVD mid neck at 45deg Chest dec'd R base, L clear RRR no MRG Abd soft ntnd no mass or ascites +bs obese no hsm GU normal male MS no joint effusions  or deformity Ext 1-2 pretib and 1-2+ thigh edema bilat / no wounds or ulcers Neuro is alert, Ox 3 , nf    Home meds: - carvedilol 12.5 bid/  losartan 25 qd/ furosemide 40 qd  - donepezil 10 hs  - ezetimibe 10 hs/ pantoprazole 40 bid/ rosuvastatin 10 qd  - prn meclizine/ vit D   Na 139  K 4.2  CO 20  BUN 28  Cr 1.60   eGFR 38  Ca 8.2  LFT's ok     WBC 8k  Hb 9.3  plt 127  INR 1.74      Creat admit 8/25 - 1.44                     8/26 - 1.31                      8/27 - 1.53                       8/28 - 1.47                      8/29 - 1.60  Hb 7.7 on admission 8/25 , rec'd prbc's (prob 1 unit) > Hb 8.7- 9.3 since prbc x 1  CT Chest 8/27 >  1. Patchy ground-glass opacities throughout the visualized lungs potentially representing stigmata of CHF given cardiomegaly and small bilateral pleural effusions. Alveolitis or pneumonitis and/or potentially hypoventilatory change versus atypical pneumonia are some other possible considerations. No dominant mass. 2. Status post CABG with ICD device in place. Coronary arteriosclerosis of the native coronary arteries. 3.  Aortic atherosclerosis (ICD10-I70.0)  CT abdomen pelvis 8/27 >  1. Peripancreatic edema raising the possibility of uncomplicated acute pancreatitis. Correlate clinically.   2. Somewhat distended and thick-walled appearance of the gallbladder which could be sympathetic due to adjacent pancreatitis versus representing changes of acute cholecystitis. 3. Normal size spleen with splenic granulomata. 4. Water attenuating cyst off the lower pole the right kidney measuring 19 mm. 5. Abnormal segmental thickening of the ascending colon, neoplasm versus marked inflammatory change/colitis. 6. Enlarged prostate. 7. Moderate aortoiliac and branch vessel atherosclerosis without aneurysm. No adenopathy. 8. Small fat containing left inguinal hernia and periumbilical fat containing hernia.  ECHO 01/24/18  Severely dilated LV, LVEF =  20% EKG 8/27 > Ventricular-paced rhythm Biventricular pacemaker detected Abnormal ECG When compared with ECG of 21-Jan-2018 09:32, Vent. rate has decreased BY 2 BPM Confirmed by Isaias Cowman [phone])  UA 01/21/18 - negative  Assessment: 1. GI bleed/ colon mass bx+ adenoCa - not active bleed now, cont to hold coumadin, Hb 8.5- 9 range, transfuse prn. Transferred for colon Ca resection, I called gen surg and they are aware of admission. Check INR in am.  2. Anemia due to ABL/ chronic blood loss - check Fe stores, transfuse prn, get colon tumor resected. 3. CM/ CHF EF 20%/ sp ICD/ sp dual chamber PM - per CT chest on 8/27 there is diffuse early pulm edema; he has sig LE edema and DOE, will start diuresis w/ IV lasix 40 bid.  Pt followed by [facility] CHF team, consider cards/ CHF team consult if needed preop.  Get baseline CXR.  4. Chron afib - is rate controlled w/ paced rhythm on EKGs.  Holding coumadin.  5. CKD III - creat 1.3- 1.6, no old creat in system. Stable 6. Hx DM2 - not on medication at home, cover w/ SSI here prn 7. HTN - bp's not high, will hold losartan / coreg for now while diuresing.  8. Dementia - mild, on aricept 9. CAD hx CABG - [id number]    Plan - as above       [place] D [facility] Pager [phone]   If 7PM-7AM, please contact night-coverage [email or web address] Password [facility] 01/25/2018, 7:35 PM

## 2018-01-25 NOTE — Discharge Summary (Signed)
[facility] [place], 84 y.o., DOB 1933-01-30, MRN [id number]. Admission date: 01/21/2018 Discharge Date 01/25/2018 Primary MD Idelle Crouch, MD Admitting Physician Dustin Flock, MD  Admission Diagnosis  Rectal bleeding [K62.5] Elevated INR [R79.1] Near syncope [R55]  Discharge Diagnosis   Active Problems: Lower GI bleed due to rectal mass Adenocarcinoma of the colon   Gastritis without bleeding Chronic systolic CHF without exasperation Atrial fibrillation with chronic Coumadin therapy Diabetes type 2 History of coronary artery disease status post CABG     Hospital Course patient is a 84 year old with the above medical history who presented to the hospital with complaint of bright red blood per rectum with acute onset.  Patient was evaluated in the ER and we were asked to admit the patient.  He is on chronic Coumadin his INR was elevated he received Kcentra to reverse his anticoagulation.  Patient also was transfused 1 unit of packed RBC as well.  He was seen in consultation by GI who performed the EGD and a colonoscopy.  Patient's EGD showed gastritis.  Colonoscopy showed a colon mass in the ascending colon.  This mass was biopsied and was found to be consistent with adenocarcinoma.  Patient was seen by surgery who recommended surgical resection.  He was also seen in consultation by cardiology who cleared him for surgery.  To do further work-up for possible metastatic disease patient had a CT scan of the chest and abdomen.  CT of the chest showed findings consistent with CHF which is chronic for patient.  He does not seem to have any pneumonia or any other pulmonary infection his procalcitonin level was normal.  Patient CT scan of the abdomen showed a fluid around the pancreas as well as fluid in the gallbladder patient did not have any abdominal pain to suggest acute pancreatitis or cholecystitis.  When I went to see the patient today they  requested that he be transferred to [facility] due to his cardiac history and his complicated medical conditions.  I initially discussed the case with on-call surgeon and then subsequently discussed the case with the hospitalist who was gracious enough to accept the patient in transfer.           Consults  Surgery and gi  Significant Tests:  See full reports for all details    Ct Chest W Contrast  Result Date: 01/23/2018 CLINICAL DATA:  Hypotension and dizziness. Possible malignant tumor found in colon on colonoscopy earlier today. EXAM: CT CHEST, ABDOMEN, AND PELVIS WITH CONTRAST TECHNIQUE: Multidetector CT imaging of the chest, abdomen and pelvis was performed following the standard protocol during bolus administration of intravenous contrast. CONTRAST:  82mL OMNIPAQUE IOHEXOL 300 MG/ML  SOLN COMPARISON:  CXR 12/29/2014 FINDINGS: CT CHEST FINDINGS Cardiovascular: Conventional branch pattern of the great vessels with atherosclerosis. No significant stenosis. There is moderate atherosclerosis of the thoracic aorta without aneurysm. Left main and three-vessel coronary arteriosclerosis of the native coronary arteries identified with post CABG change. Left-sided ICD device with leads in the right atrium, right ventricle and coronary sinus. No pericardial effusion. There is cardiomegaly. No large central pulmonary embolus. Mediastinum/Nodes: Small subcentimeter short axis lymph nodes within the mediastinum and both hila. Mild subcarinal lymphadenopathy up to 18 mm short axis. Trachea and mainstem bronchi are patent. Esophagus is unremarkable. Lungs/Pleura: Small right greater than left pleural effusions. Patchy ground-glass opacities are noted throughout both lungs with mild peribronchial thickening. Findings are nonspecific but atypical pneumonia, alveolitis or  pneumonitis are among some possibilities or stigmata of CHF given the small pleural effusions and cardiomegaly. No pneumothorax. No dominant  mass is identified. Musculoskeletal: No aggressive osseous lesions or fracture. CT ABDOMEN PELVIS FINDINGS Hepatobiliary: Distended gallbladder with mural thickening and edema raising concern for cholecystitis. Sympathetic thickening from possible adjacent pancreatitis given peripancreatic edema is also a possibility. The liver is unremarkable and free of enhancing mass lesions. No biliary dilatation. Pancreas: Peripancreatic edema is noted without space-occupying mass of the pancreas or ductal dilatation. Pancreatitis is a possibility without complicating features. Spleen: Splenic granulomas.  No splenomegaly. Adrenals/Urinary Tract: Normal bilateral adrenal glands. Water attenuating cyst off the lower pole of the right kidney measuring 19 mm in diameter. No nephrolithiasis nor hydroureteronephrosis. The urinary bladder is unremarkable for the degree of distention. Stomach/Bowel: Physiologic distention of the stomach with normal small bowel rotation and ligament of Treitz position. No small bowel obstruction or inflammation. Distal and terminal ileum are normal. Segmental thickening of the ascending colon is identified either representing neoplastic enlargement and thickening or focal inflammatory thickening suggest colitis. Clinical correlation with findings at colonoscopy recommended. Appendectomy. Vascular/Lymphatic: Moderate aortoiliac and branch vessel atherosclerosis without aneurysm. Suboptimal opacification of the splenic and portal veins. Small porta hepatic subcentimeter short axis lymph nodes. No adenopathy by CT size criteria. Reproductive: Enlarged prostate measuring 6.6 x 6 x 5.6 cm. Other: Small fat containing left inguinal hernia. Small periumbilical fat containing hernia. Musculoskeletal: No aggressive osseous lesions. Mild degenerative change along the thoracolumbar spine. IMPRESSION: Chest CT: 1. Patchy ground-glass opacities throughout the visualized lungs potentially representing stigmata of  CHF given cardiomegaly and small bilateral pleural effusions. Alveolitis or pneumonitis and/or potentially hypoventilatory change versus atypical pneumonia are some other possible considerations. No dominant mass. 2. Status post CABG with ICD device in place. Coronary arteriosclerosis of the native coronary arteries. 3.  Aortic atherosclerosis (ICD10-I70.0) CT abdomen pelvis: 1. Peripancreatic edema raising the possibility of uncomplicated acute pancreatitis. Correlate clinically. 2. Somewhat distended and thick-walled appearance of the gallbladder which could be sympathetic due to adjacent pancreatitis versus representing changes of acute cholecystitis. 3. Normal size spleen with splenic granulomata. 4. Water attenuating cyst off the lower pole the right kidney measuring 19 mm. 5. Abnormal segmental thickening of the ascending colon, neoplasm versus marked inflammatory change/colitis. 6. Enlarged prostate. 7. Moderate aortoiliac and branch vessel atherosclerosis without aneurysm. No adenopathy. 8. Small fat containing left inguinal hernia and periumbilical fat containing hernia. Electronically Signed   By: Ashley Royalty M.D.   On: 01/23/2018 20:36   Ct Abdomen Pelvis W Contrast  Result Date: 01/23/2018 CLINICAL DATA:  Hypotension and dizziness. Possible malignant tumor found in colon on colonoscopy earlier today. EXAM: CT CHEST, ABDOMEN, AND PELVIS WITH CONTRAST TECHNIQUE: Multidetector CT imaging of the chest, abdomen and pelvis was performed following the standard protocol during bolus administration of intravenous contrast. CONTRAST:  75mL OMNIPAQUE IOHEXOL 300 MG/ML  SOLN COMPARISON:  CXR 12/29/2014 FINDINGS: CT CHEST FINDINGS Cardiovascular: Conventional branch pattern of the great vessels with atherosclerosis. No significant stenosis. There is moderate atherosclerosis of the thoracic aorta without aneurysm. Left main and three-vessel coronary arteriosclerosis of the native coronary arteries identified with  post CABG change. Left-sided ICD device with leads in the right atrium, right ventricle and coronary sinus. No pericardial effusion. There is cardiomegaly. No large central pulmonary embolus. Mediastinum/Nodes: Small subcentimeter short axis lymph nodes within the mediastinum and both hila. Mild subcarinal lymphadenopathy up to 18 mm short axis. Trachea and mainstem bronchi are patent. Esophagus  is unremarkable. Lungs/Pleura: Small right greater than left pleural effusions. Patchy ground-glass opacities are noted throughout both lungs with mild peribronchial thickening. Findings are nonspecific but atypical pneumonia, alveolitis or pneumonitis are among some possibilities or stigmata of CHF given the small pleural effusions and cardiomegaly. No pneumothorax. No dominant mass is identified. Musculoskeletal: No aggressive osseous lesions or fracture. CT ABDOMEN PELVIS FINDINGS Hepatobiliary: Distended gallbladder with mural thickening and edema raising concern for cholecystitis. Sympathetic thickening from possible adjacent pancreatitis given peripancreatic edema is also a possibility. The liver is unremarkable and free of enhancing mass lesions. No biliary dilatation. Pancreas: Peripancreatic edema is noted without space-occupying mass of the pancreas or ductal dilatation. Pancreatitis is a possibility without complicating features. Spleen: Splenic granulomas.  No splenomegaly. Adrenals/Urinary Tract: Normal bilateral adrenal glands. Water attenuating cyst off the lower pole of the right kidney measuring 19 mm in diameter. No nephrolithiasis nor hydroureteronephrosis. The urinary bladder is unremarkable for the degree of distention. Stomach/Bowel: Physiologic distention of the stomach with normal small bowel rotation and ligament of Treitz position. No small bowel obstruction or inflammation. Distal and terminal ileum are normal. Segmental thickening of the ascending colon is identified either representing neoplastic  enlargement and thickening or focal inflammatory thickening suggest colitis. Clinical correlation with findings at colonoscopy recommended. Appendectomy. Vascular/Lymphatic: Moderate aortoiliac and branch vessel atherosclerosis without aneurysm. Suboptimal opacification of the splenic and portal veins. Small porta hepatic subcentimeter short axis lymph nodes. No adenopathy by CT size criteria. Reproductive: Enlarged prostate measuring 6.6 x 6 x 5.6 cm. Other: Small fat containing left inguinal hernia. Small periumbilical fat containing hernia. Musculoskeletal: No aggressive osseous lesions. Mild degenerative change along the thoracolumbar spine. IMPRESSION: Chest CT: 1. Patchy ground-glass opacities throughout the visualized lungs potentially representing stigmata of CHF given cardiomegaly and small bilateral pleural effusions. Alveolitis or pneumonitis and/or potentially hypoventilatory change versus atypical pneumonia are some other possible considerations. No dominant mass. 2. Status post CABG with ICD device in place. Coronary arteriosclerosis of the native coronary arteries. 3.  Aortic atherosclerosis (ICD10-I70.0) CT abdomen pelvis: 1. Peripancreatic edema raising the possibility of uncomplicated acute pancreatitis. Correlate clinically. 2. Somewhat distended and thick-walled appearance of the gallbladder which could be sympathetic due to adjacent pancreatitis versus representing changes of acute cholecystitis. 3. Normal size spleen with splenic granulomata. 4. Water attenuating cyst off the lower pole the right kidney measuring 19 mm. 5. Abnormal segmental thickening of the ascending colon, neoplasm versus marked inflammatory change/colitis. 6. Enlarged prostate. 7. Moderate aortoiliac and branch vessel atherosclerosis without aneurysm. No adenopathy. 8. Small fat containing left inguinal hernia and periumbilical fat containing hernia. Electronically Signed   By: Ashley Royalty M.D.   On: 01/23/2018 20:36        Today   Subjective:   Luis Palmer patient doing well denies any complaints  Objective:   Blood pressure 113/72, pulse 79, temperature 98.2 F (36.8 C), temperature source Oral, resp. rate (!) 24, height 6' (1.829 m), weight 99.8 kg, SpO2 100 %.  .  Intake/Output Summary (Last 24 hours) at 01/25/2018 1618 Last data filed at 01/25/2018 1412 Gross per 24 hour  Intake 717 ml  Output 100 ml  Net 617 ml    Exam VITAL SIGNS: Blood pressure 113/72, pulse 79, temperature 98.2 F (36.8 C), temperature source Oral, resp. rate (!) 24, height 6' (1.829 m), weight 99.8 kg, SpO2 100 %.  GENERAL:  84 y.o.-year-old patient lying in the bed with no acute distress.  EYES: Pupils equal, round, reactive to  light and accommodation. No scleral icterus. Extraocular muscles intact.  HEENT: Head atraumatic, normocephalic. Oropharynx and nasopharynx clear.  NECK:  Supple, no jugular venous distention. No thyroid enlargement, no tenderness.  LUNGS: Normal breath sounds bilaterally, no wheezing, rales,rhonchi or crepitation. No use of accessory muscles of respiration.  CARDIOVASCULAR: S1, S2 normal. No murmurs, rubs, or gallops.  ABDOMEN: Soft, nontender, nondistended. Bowel sounds present. No organomegaly or mass.  EXTREMITIES: No pedal edema, cyanosis, or clubbing.  NEUROLOGIC: Cranial nerves II through XII are intact. Muscle strength 5/5 in all extremities. Sensation intact. Gait not checked.  PSYCHIATRIC: The patient is alert and oriented x 3.  SKIN: No obvious rash, lesion, or ulcer.   Data Review     CBC w Diff:  Lab Results  Component Value Date   WBC 8.7 01/25/2018   HGB 9.3 (L) 01/25/2018   HGB 12.3 (L) 05/01/2014   HCT 27.1 (L) 01/25/2018   HCT 37.2 (L) 05/01/2014   PLT 127 (L) 01/25/2018   PLT 133 (L) 05/01/2014   LYMPHOPCT 31 12/06/2017   LYMPHOPCT 37.8 05/01/2014   MONOPCT 16 12/06/2017   MONOPCT 17.7 05/01/2014   EOSPCT 3 12/06/2017   EOSPCT 5.7 05/01/2014   BASOPCT 1  12/06/2017   BASOPCT 0.6 05/01/2014   CMP:  Lab Results  Component Value Date   NA 139 01/25/2018   NA 133 (L) 08/03/2013   K 4.2 01/25/2018   K 4.7 08/03/2013   CL 110 01/25/2018   CL 101 08/03/2013   CO2 20 (L) 01/25/2018   CO2 27 08/03/2013   BUN 28 (H) 01/25/2018   BUN 16 08/03/2013   CREATININE 1.60 (H) 01/25/2018   CREATININE 1.13 08/03/2013   PROT 6.0 (L) 01/25/2018   PROT 6.6 07/31/2013   ALBUMIN 2.9 (L) 01/25/2018   ALBUMIN 2.6 (L) 07/31/2013   BILITOT 1.2 01/25/2018   BILITOT 1.2 (H) 07/31/2013   ALKPHOS 46 01/25/2018   ALKPHOS 86 07/31/2013   AST 37 01/25/2018   AST 20 07/31/2013   ALT 42 01/25/2018   ALT 27 07/31/2013  .  Micro Results No results found for this or any previous visit (from the past 240 hour(s)).      Code Status Orders  (From admission, onward)         Start     Ordered   01/21/18 1233  Full code  Continuous     01/21/18 1232        Code Status History    This patient has a current code status but no historical code status.    Advance Directive Documentation     Most Recent Value  Type of Advance Directive  Healthcare Power of Attorney, Living will  Pre-existing out of facility DNR order (yellow form or pink MOST form)  -  "MOST" Form in Place?  -            Discharge Medications   Allergies as of 01/25/2018      Reactions   Pseudoephedrine Hcl Other (See Comments)   Other Reaction: ANURIA   Ace Inhibitors Cough, Other (See Comments)   cough cough   Other Other (See Comments)   Other Reaction: fluid retention Other Reaction: fluid retention   Nsaids Other (See Comments)   Reaction:  Fluid retention    Sudafed [pseudoephedrine Hcl] Other (See Comments)   Pt states that it messes up his prostate.     Vasotec [enalapril] Cough      Medication List  STOP taking these medications   COUMADIN 5 MG tablet Generic drug:  warfarin   metFORMIN 500 MG tablet Commonly known as:  GLUCOPHAGE     TAKE these  medications   carvedilol 12.5 MG tablet Commonly known as:  COREG Take 12.5 mg by mouth 2 (two) times daily.   donepezil 10 MG tablet Commonly known as:  ARICEPT Take 10 mg by mouth at bedtime.   ezetimibe 10 MG tablet Commonly known as:  ZETIA Take 10 mg by mouth at bedtime.   furosemide 40 MG tablet Commonly known as:  LASIX Take 40 mg by mouth daily.   losartan 25 MG tablet Commonly known as:  COZAAR Take 25 mg by mouth daily.   meclizine 25 MG tablet Commonly known as:  ANTIVERT Take 25 mg by mouth daily.   pantoprazole 40 MG tablet Commonly known as:  PROTONIX Take 1 tablet (40 mg total) by mouth 2 (two) times daily.   rosuvastatin 10 MG tablet Commonly known as:  CRESTOR Take 10 mg by mouth daily.   Vitamin D 2000 units Caps Take 1 capsule by mouth daily.          Total Time in preparing paper work, data evaluation and todays exam - 31 minutes  Dustin Flock M.D on 01/25/2018 at [place]  [phone]

## 2018-01-26 ENCOUNTER — Other Ambulatory Visit: Payer: Self-pay

## 2018-01-26 ENCOUNTER — Encounter (HOSPITAL_COMMUNITY): Payer: Self-pay | Admitting: *Deleted

## 2018-01-26 DIAGNOSIS — C189 Malignant neoplasm of colon, unspecified: Secondary | ICD-10-CM

## 2018-01-26 DIAGNOSIS — Z95810 Presence of automatic (implantable) cardiac defibrillator: Secondary | ICD-10-CM

## 2018-01-26 DIAGNOSIS — I5023 Acute on chronic systolic (congestive) heart failure: Secondary | ICD-10-CM

## 2018-01-26 DIAGNOSIS — I255 Ischemic cardiomyopathy: Secondary | ICD-10-CM

## 2018-01-26 DIAGNOSIS — Z01810 Encounter for preprocedural cardiovascular examination: Secondary | ICD-10-CM

## 2018-01-26 LAB — GLUCOSE, CAPILLARY
GLUCOSE-CAPILLARY: 126 mg/dL — AB (ref 70–99)
Glucose-Capillary: 146 mg/dL — ABNORMAL HIGH (ref 70–99)
Glucose-Capillary: 141 mg/dL — ABNORMAL HIGH (ref 70–99)
Glucose-Capillary: 135 mg/dL — ABNORMAL HIGH (ref 70–99)

## 2018-01-26 LAB — IRON AND TIBC
Iron: 17 ug/dL — ABNORMAL LOW (ref 45–182)
SATURATION RATIOS: 8 % — AB (ref 17.9–39.5)
TIBC: 211 ug/dL — AB (ref 250–450)
UIBC: 194 ug/dL

## 2018-01-26 LAB — BASIC METABOLIC PANEL
Anion gap: 7 (ref 5–15)
BUN: 25 mg/dL — ABNORMAL HIGH (ref 8–23)
CHLORIDE: 110 mmol/L (ref 98–111)
CO2: 21 mmol/L — AB (ref 22–32)
CREATININE: 1.58 mg/dL — AB (ref 0.61–1.24)
Calcium: 8.2 mg/dL — ABNORMAL LOW (ref 8.9–10.3)
GFR calc Af Amer: 45 mL/min — ABNORMAL LOW (ref >60)
GFR calc non Af Amer: 39 mL/min — ABNORMAL LOW (ref >60)
GLUCOSE: 147 mg/dL — AB (ref 70–99)
Potassium: 3.8 mmol/L (ref 3.5–5.1)
Sodium: 138 mmol/L (ref 135–145)

## 2018-01-26 LAB — CBC
HCT: 25.8 % — ABNORMAL LOW (ref 39.0–52.0)
HEMOGLOBIN: 8.2 g/dL — AB (ref 13.0–17.0)
MCH: 27.8 pg (ref 26.0–34.0)
MCHC: 31.8 g/dL (ref 30.0–36.0)
MCV: 87.5 fL (ref 78.0–100.0)
PLATELETS: 127 10*3/uL — AB (ref 150–400)
RBC: 2.95 MIL/uL — AB (ref 4.22–5.81)
RDW: 17.2 % — ABNORMAL HIGH (ref 11.5–15.5)
WBC: 6.2 10*3/uL (ref 4.0–10.5)

## 2018-01-26 LAB — BRAIN NATRIURETIC PEPTIDE: B NATRIURETIC PEPTIDE 5: 1290.3 pg/mL — AB (ref 0.0–100.0)

## 2018-01-26 LAB — PROTIME-INR
INR: 1.86
Prothrombin Time: 21.3 seconds — ABNORMAL HIGH (ref 11.4–15.2)

## 2018-01-26 MED ORDER — FUROSEMIDE 10 MG/ML IJ SOLN
40.0000 mg | Freq: Two times a day (BID) | INTRAMUSCULAR | Status: DC
  Administered 2018-01-26 – 2018-02-01 (×10): 40 mg via INTRAVENOUS
  Filled 2018-01-26 (×10): qty 4

## 2018-01-26 MED ORDER — CARVEDILOL 12.5 MG PO TABS
12.5000 mg | ORAL_TABLET | Freq: Two times a day (BID) | ORAL | Status: DC
  Administered 2018-01-26 – 2018-02-12 (×23): 12.5 mg via ORAL
  Filled 2018-01-26 (×28): qty 1

## 2018-01-26 MED ORDER — VITAMIN K1 10 MG/ML IJ SOLN
5.0000 mg | Freq: Once | INTRAVENOUS | Status: AC
  Administered 2018-01-26: 5 mg via INTRAVENOUS
  Filled 2018-01-26 (×2): qty 0.5

## 2018-01-26 MED ORDER — POLYETHYLENE GLYCOL 3350 17 G PO PACK
17.0000 g | PACK | Freq: Every day | ORAL | Status: DC
  Administered 2018-01-26 – 2018-02-12 (×9): 17 g via ORAL
  Filled 2018-01-26 (×16): qty 1

## 2018-01-26 NOTE — Consult Note (Addendum)
The patient has been seen in conjunction with Luis Newton, NP-C. All aspects of care have been considered and discussed. The patient has been personally interviewed, examined, and all clinical data has been reviewed.   Chronic systolic heart failure with EF less than 30%, by V pacemaker, chronic atrial fibrillation, CAD with known occlusion of the saphenous vein graft to right coronary and patent LIMA to LAD at time of cath in 2004, no anginal complaints, obstructive sleep apnea, who presented with progressive fatigue and was found to have severe anemia with work-up revealing a colonic mass requiring resection.  He is on stable beta-blocker and ARB therapy.  He has no heart failure symptoms or anginal complaints.  He is able to lie flat.  Moderate JVD with the patient lying at 20 degrees.  Lungs clear.  No significant murmurs are heard.  No peripheral edema.  Ischemic cardiomyopathy with severe systolic heart failure/dysfunction currently compensated.  No ischemic symptoms.  Has pacemaker so bradycardia would not be a problem.  He is cleared for general anesthesia and the upcoming surgery but will be high risk because of his significant underlying cardiac issues.  This was discussed in detail with the patient and wife.  It is important to continue the patient's carvedilol.  Hold ARB therapy until after surgery and reinstitute as blood pressure allows.  Pacemaker interrogation to determine why there have occasionally been heart rates of 40 bpm.  Device representative has been advised.     Palmer Consultation:   Patient ID: Luis Palmer; [id number]; May 18, 1933   Admit date: 01/25/2018 Date of Consult: 01/26/2018  Primary Care Provider: Idelle Crouch, MD Primary Cardiologist: Luis Palmer Primary Electrophysiologist:  Luis Palmer   Patient Profile:   Luis Palmer is a 84 y.o. male with a hx of CAD s/p CABG, ischemic cardiomyopathy with EF 15-20% with CRT-D,  hypertension , atrial fibrillation s/p pulmonary vein ablation 2010 and A. fib ablation 2012 and DCCV 2016 on Coumadin, OSA on CPAP, chronic lower extremity edema, hyperlipidemia,  who is being seen today for preoperative cardiac evaluation at the request of Luis Palmer.  History of Present Illness:   Luis Palmer has a long cardiac history, followed at [facility].  Per review of his [place] [facility] notes the patient has a history of CAD with remote CABG 1986, last cardiac cath 2005 showed RCA 100%, LM NL, RAMUS NL, LCX TINY 40%, LAD 90%, 75%, 100%, LIMA > LAD PATENT, SVG > RCA 100%, EF < 15% [12/05].  Most recent echocardiogram in 09/2016 showed EF 30%.  He has a history of ICD, CRT-D.  He is treated with carvedilol 12.5 mg twice daily, Entresto 24-26 mg twice daily, Lasix 40 mg daily.  He is not treated with Spironolactone due to hypotension.  He also has a history of atrial fibrillation s/p pulmonary vein ablation 2010 and A. fib ablation 2012 and DCCV 2016 on chronic Coumadin.  Notes indicate that he was weaned off sotalol and was without recurrent symptomatic atrial arrhythmia as of April 2019.  In April he was noted to have complaints of fatigue and was felt to be deconditioned.  He was referred to cardiac rehab. He did not attend as he felt that he was exercising enough at home.   Luis Palmer was admitted to [facility] on 01/21/2018 with anemia.  He received Kcentra to reverse his anticoagulation and was transfused with packed red blood cells.  He was seen by GI and an  EGD showed gastritis.  Colonoscopy showed a colon mass in the ascending colon.  Mass biopsy showed adenocarcinoma.  He was transferred to [facility] on 01/25/2018 with plans for a colon resection.  Palmer is asked for preop evaluation and to follow along for management of any cardiac arrhythmias or complications.  On my evaluation Luis Palmer says that he had been walking for 1 mile a day with  no chest pain. He still denies any chest pain. He has chronic mild exertional dyspnea. Recently he has been more fatigued and unsteady and realized that he was anemic. He usually has periodic iron infusions that make him feel better. His wife says that his BP was low in the 70's and dropped when he stood up. He is still a little short of breath with moving in the bed. His symptoms feel to him like they are all related to anemia. He does not feel like he is having worsened heart failure symptoms.   He has had no orthopnea, PND or edema. His home wts have been stable within a few pounds of lasix. He has not needed any additional lasix doses in many months. He was previously on Entresto but due to low BP and feeling unsteady, this was switched bask to losartan. His symptoms did not really improve.    EF 46% [7/86] EF 30% [7/01] EF 15% [12/05] EF 25% [[facility] 2/10] EF 30% [[facility] ECHO 09/2008] EF 20% [THA NUC, LV WORSE THAN 2005, NO ISCHEMIA, 10/2009] EF 30% 10/25/2016 EF 20%  01/24/18 at [facility]   Past Medical History:  Diagnosis Date  . Anemia   . Atrial fibrillation ([place])   . CHF (congestive heart failure) ([place])   . Diabetes mellitus without complication ([place])   . Hx of CABG     Past Surgical History:  Procedure Laterality Date  . ABLATION     X 2 FOR A-FIB  . APPENDECTOMY    . CARDIAC DEFIBRILLATOR PLACEMENT    . CARDIAC SURGERY    . COLONOSCOPY WITH PROPOFOL N/A 01/23/2018   Procedure: COLONOSCOPY WITH PROPOFOL;  Surgeon: Lucilla Lame, MD;  Location: [facility] ENDOSCOPY;  Service: Endoscopy;  Laterality: N/A;  . CORONARY ARTERY BYPASS GRAFT    . ESOPHAGOGASTRODUODENOSCOPY (EGD) WITH PROPOFOL N/A 01/22/2018   Procedure: ESOPHAGOGASTRODUODENOSCOPY (EGD) WITH PROPOFOL;  Surgeon: Lucilla Lame, MD;  Location: [facility] ENDOSCOPY;  Service: Endoscopy;  Laterality: N/A;  . INSERT / REPLACE / REMOVE PACEMAKER    . PACEMAKER IMPLANT       Home Medications:  Prior to Admission medications   Medication  Sig Start Date End Date Taking? Authorizing Provider  betamethasone dipropionate 0.05 % lotion Apply 1 application topically daily as needed for itching or dry skin. 12/26/17  Yes [provider]  carvedilol (COREG) 12.5 MG tablet Take 12.5 mg by mouth 2 (two) times daily.   Yes [provider]  Cholecalciferol (VITAMIN D) 2000 UNITS CAPS Take 1 capsule by mouth daily.   Yes [provider]  donepezil (ARICEPT) 10 MG tablet Take 10 mg by mouth at bedtime.   Yes [provider]  ezetimibe (ZETIA) 10 MG tablet Take 10 mg by mouth at bedtime.   Yes [provider]  furosemide (LASIX) 40 MG tablet Take 40 mg by mouth daily.   Yes [provider]  Iodoquinol-HC-Aloe Polysacch (ALCORTIN A) 1-2-1 % GEL Apply 1 application topically as needed for dry skin. 12/30/17  Yes [provider]  losartan (COZAAR) 25 MG tablet Take  25 mg by mouth daily. 01/04/18  Yes [provider]  meclizine (ANTIVERT) 25 MG tablet Take 25 mg by mouth daily.    Yes [provider]  pantoprazole (PROTONIX) 40 MG tablet Take 1 tablet (40 mg total) by mouth 2 (two) times daily. 01/25/18  Yes Dustin Flock, MD  rosuvastatin (CRESTOR) 10 MG tablet Take 10 mg by mouth daily. 12/30/17  Yes [provider]    Inpatient Medications: Scheduled Meds: . cholecalciferol  2,000 Units Oral Daily  . donepezil  10 mg Oral QHS  . ezetimibe  10 mg Oral QHS  . furosemide  40 mg Intravenous BID  . insulin aspart  0-5 Units Subcutaneous QHS  . insulin aspart  0-9 Units Subcutaneous TID WC  . pantoprazole  40 mg Oral BID  . rosuvastatin  10 mg Oral Daily  . sodium chloride flush  3 mL Intravenous Q12H   Continuous Infusions: . sodium chloride     PRN Meds: sodium chloride, acetaminophen **OR** acetaminophen, ondansetron **OR** ondansetron (ZOFRAN) IV, sodium chloride flush  Allergies:    Allergies  Allergen Reactions  . Pseudoephedrine Hcl Other (See  Comments)    Other Reaction: ANURIA  . Ace Inhibitors Cough and Other (See Comments)    cough cough   . Nsaids Other (See Comments)    Reaction:  Fluid retention   . Vasotec [Enalapril] Cough    Social History:   Social History   Socioeconomic History  . Marital status: Married    Spouse name: Lelon Frohlich  . Number of children: 4  . Years of education: Not on file  . Highest education level: Not on file  Occupational History  . Not on file  Social Needs  . Financial resource strain: Not on file  . Food insecurity:    Worry: Not on file    Inability: Not on file  . Transportation needs:    Medical: Not on file    Non-medical: Not on file  Tobacco Use  . Smoking status: Former Smoker    Packs/day: 1.00    Years: 40.00    Pack years: 40.00    Types: Cigarettes    Last attempt to quit: 1986    Years since quitting: 33.6  . Smokeless tobacco: Never Used  Substance and Sexual Activity  . Alcohol use: No  . Drug use: Not Currently  . Sexual activity: Not on file  Lifestyle  . Physical activity:    Days per week: Not on file    Minutes per session: Not on file  . Stress: Not on file  Relationships  . Social connections:    Talks on phone: Not on file    Gets together: Not on file    Attends religious service: Not on file    Active member of club or organization: Not on file    Attends meetings of clubs or organizations: Not on file    Relationship status: Not on file  . Intimate partner violence:    Fear of current or ex partner: Not on file    Emotionally abused: Not on file    Physically abused: Not on file    Forced sexual activity: Not on file  Other Topics Concern  . Not on file  Social History Narrative  . Not on file    Family History:    Family History  Problem Relation Age of Onset  . Congestive Heart Failure Mother   . Diabetes Father   . Stroke Father   .  Lung cancer Brother      ROS:  Please see the history of present illness.   All other ROS  reviewed and negative.     Physical Exam/Data:   Vitals:   01/25/18 2023 01/25/18 2208 01/26/18 0528 01/26/18 0600  BP: 124/66 115/70 (!) 111/47   Pulse:  76 (!) 59   Resp:      Temp:  97.8 F (36.6 C) 97.8 F (36.6 C)   TempSrc:  Oral Oral   SpO2:  100% 95%   Weight:    106.5 kg  Height: 6' (1.829 m)       Intake/Output Summary (Last 24 hours) at 01/26/2018 0837 Last data filed at 01/26/2018 0130 Gross per 24 hour  Intake 295 ml  Output 800 ml  Net -505 ml   Filed Weights   01/26/18 0600  Weight: 106.5 kg   Body mass index is 31.84 kg/m.  General:  Well nourished, well developed, in no acute distress HEENT: normal Lymph: no adenopathy Neck: no JVD Endocrine:  No thryomegaly Vascular: No carotid bruits; FA pulses 2+ bilaterally without bruits  Cardiac:  normal S1, S2; RRR; no murmur  Lungs:  clear to auscultation bilaterally, no wheezing, rhonchi or rales  Abd: soft, nontender, no hepatomegaly  Ext: no edema. Darkened skin of bilateral lower legs Musculoskeletal:  No deformities, BUE and BLE strength normal and equal Skin: warm and dry  Neuro:  CNs 2-12 intact, no focal abnormalities noted Psych:  Normal affect   EKG:  The EKG was personally reviewed and demonstrates:  BiV pacing at 60 bpm. Appears to be afib underlying.  Telemetry:  Telemetry was personally reviewed and demonstrates:  afib with intermittent V pacing 40's-60's  Relevant CV Studies:  Echocardiogram 01/24/18 Study Conclusions - Left ventricle: The cavity size was severely dilated. Systolic   function was severely reduced. The estimated ejection fraction   was 20%. Diffuse hypokinesis. - Aortic valve: Valve area (Vmax): 2.16 cm^2. - Mitral valve: There was mild to moderate regurgitation. - Left atrium: The atrium was mildly dilated. - Right ventricle: The cavity size was mildly dilated. - Right atrium: The atrium was mildly dilated. - Tricuspid valve: There was mild-moderate  regurgitation.  Echocardiogram 10/25/2016  Ref Range   LV Ejection Fraction (%) 30 %  Right Ventricle Systolic Pressure (mmHg) 42 mmHg  Left Atrium Diameter (cm) 5.5 cm  LV End Diastolic Diameter (cm) 6.0 cm  LV End Systolic Diameter (cm) 5.6 cm  LV Septum Wall Thickness (cm) 1.3 cm  LV Posterior Wall Thickness (cm) 1.1 cm  Tricuspid Valve Regurgitation Grade trivial   Tricuspid Valve Regurgitation Max Velocity (m/s) 3.1 m/s  Mitral Valve Regurgitation Grade trivial   Mitral Valve Stenosis Grade none   Aortic Valve Regurgitation Grade none   Aortic Valve Stenosis Grade none     SEVERE LV DYSFUNCTION (See above) WITH MILD LVH MILD RV SYSTOLIC DYSFUNCTION (See above) VALVULAR REGURGITATION: TRIVIAL MR, TRIVIAL PR, TRIVIAL TR NO VALVULAR STENOSIS  Compared with prior Echo study on 04/07/2014: LV EF SLIGHTLY IMPROVED.  Cardiac cath 2005 INTERPRETATION: 1. The right coronary artery was totally occluded in the mid portion after    a long diffuse narrowing.  The vein graft to the distal RCA is likewise    occluded. 2. The left main is free of significant disease. 3. A large ramus intermediate is present without significant disease. 4. The circumflex artery consists of a tiny OM-1 which arises high on the    vessel.  Subsequently, in the mid circumflex is a 30-40% lesion.  The    terminal OMBs are small but free of disease. 5. The LAD shows a bulky 90% plaque at the first septal perforator.    Subsequently, a large diagonal rises beyond which, within the LAD, is a    75% lesion.  Subsequently, the LAD is totally occluded.  However, a    patent internal mammary graft fills the LAD quite nicely. 6. The left ventricular systolic function is markedly diminished with    inferior akinesis and moderate anterior hypokinesis.  The LV cavity is    dilated.  The LVEDP is 19.  The estimated LVEF is less than 15%.  Laboratory Data:  Chemistry Recent Labs  Lab 01/24/18 0337  01/25/18 0508 01/26/18 0519  NA 139 139 138  K 4.0 4.2 3.8  CL 114* 110 110  CO2 19* 20* 21*  GLUCOSE 133* 155* 147*  BUN 29* 28* 25*  CREATININE 1.47* 1.60* 1.58*  CALCIUM 7.9* 8.2* 8.2*  GFRNONAA 42* 38* 39*  GFRAA 49* 44* 45*  ANIONGAP 6 9 7     Recent Labs  Lab 01/25/18 0508  PROT 6.0*  ALBUMIN 2.9*  AST 37  ALT 42  ALKPHOS 46  BILITOT 1.2   Hematology Recent Labs  Lab 01/24/18 0337 01/25/18 0508 01/26/18 0519  WBC 7.2 8.7 6.2  RBC 3.06* 3.20* 2.95*  HGB 8.7* 9.3* 8.2*  HCT 25.6* 27.1* 25.8*  MCV 83.7 85.0 87.5  MCH 28.5 29.1 27.8  MCHC 34.1 34.3 31.8  RDW 17.5* 18.0* 17.2*  PLT 115* 127* 127*   Cardiac EnzymesNo results for input(s): TROPONINI in the last 168 hours. No results for input(s): TROPIPOC in the last 168 hours.  BNPNo results for input(s): BNP, PROBNP in the last 168 hours.  DDimer No results for input(s): DDIMER in the last 168 hours.  Radiology/Studies:  Dg Chest 2 View  Result Date: 01/25/2018 CLINICAL DATA:  Congestive heart failure EXAM: CHEST - 2 VIEW COMPARISON:  CT 01/23/2018 FINDINGS: Cardiomegaly with moderate aortic atherosclerosis. Left-sided ICD device projects over the mid left hemithorax. Leads in the right atrium, right ventricle and coronary sinus are noted. Small bilateral pleural effusions blunting the costophrenic angles. No overt pulmonary edema. Mild interstitial change of the lungs. IMPRESSION: Cardiomegaly with aortic atherosclerosis. Small bilateral pleural effusions. No active pulmonary disease. Electronically Signed   By: Ashley Royalty M.D.   On: 01/25/2018 22:53   Ct Chest W Contrast  Result Date: 01/23/2018 CLINICAL DATA:  Hypotension and dizziness. Possible malignant tumor found in colon on colonoscopy earlier today. EXAM: CT CHEST, ABDOMEN, AND PELVIS WITH CONTRAST TECHNIQUE: Multidetector CT imaging of the chest, abdomen and pelvis was performed following the standard protocol during bolus administration of intravenous  contrast. CONTRAST:  61mL OMNIPAQUE IOHEXOL 300 MG/ML  SOLN COMPARISON:  CXR 12/29/2014 FINDINGS: CT CHEST FINDINGS Cardiovascular: Conventional branch pattern of the great vessels with atherosclerosis. No significant stenosis. There is moderate atherosclerosis of the thoracic aorta without aneurysm. Left main and three-vessel coronary arteriosclerosis of the native coronary arteries identified with post CABG change. Left-sided ICD device with leads in the right atrium, right ventricle and coronary sinus. No pericardial effusion. There is cardiomegaly. No large central pulmonary embolus. Mediastinum/Nodes: Small subcentimeter short axis lymph nodes within the mediastinum and both hila. Mild subcarinal lymphadenopathy up to 18 mm short axis. Trachea and mainstem bronchi are patent. Esophagus is unremarkable. Lungs/Pleura: Small right greater than left pleural effusions. Patchy ground-glass opacities are  noted throughout both lungs with mild peribronchial thickening. Findings are nonspecific but atypical pneumonia, alveolitis or pneumonitis are among some possibilities or stigmata of CHF given the small pleural effusions and cardiomegaly. No pneumothorax. No dominant mass is identified. Musculoskeletal: No aggressive osseous lesions or fracture. CT ABDOMEN PELVIS FINDINGS Hepatobiliary: Distended gallbladder with mural thickening and edema raising concern for cholecystitis. Sympathetic thickening from possible adjacent pancreatitis given peripancreatic edema is also a possibility. The liver is unremarkable and free of enhancing mass lesions. No biliary dilatation. Pancreas: Peripancreatic edema is noted without space-occupying mass of the pancreas or ductal dilatation. Pancreatitis is a possibility without complicating features. Spleen: Splenic granulomas.  No splenomegaly. Adrenals/Urinary Tract: Normal bilateral adrenal glands. Water attenuating cyst off the lower pole of the right kidney measuring 19 mm in  diameter. No nephrolithiasis nor hydroureteronephrosis. The urinary bladder is unremarkable for the degree of distention. Stomach/Bowel: Physiologic distention of the stomach with normal small bowel rotation and ligament of Treitz position. No small bowel obstruction or inflammation. Distal and terminal ileum are normal. Segmental thickening of the ascending colon is identified either representing neoplastic enlargement and thickening or focal inflammatory thickening suggest colitis. Clinical correlation with findings at colonoscopy recommended. Appendectomy. Vascular/Lymphatic: Moderate aortoiliac and branch vessel atherosclerosis without aneurysm. Suboptimal opacification of the splenic and portal veins. Small porta hepatic subcentimeter short axis lymph nodes. No adenopathy by CT size criteria. Reproductive: Enlarged prostate measuring 6.6 x 6 x 5.6 cm. Other: Small fat containing left inguinal hernia. Small periumbilical fat containing hernia. Musculoskeletal: No aggressive osseous lesions. Mild degenerative change along the thoracolumbar spine. IMPRESSION: Chest CT: 1. Patchy ground-glass opacities throughout the visualized lungs potentially representing stigmata of CHF given cardiomegaly and small bilateral pleural effusions. Alveolitis or pneumonitis and/or potentially hypoventilatory change versus atypical pneumonia are some other possible considerations. No dominant mass. 2. Status post CABG with ICD device in place. Coronary arteriosclerosis of the native coronary arteries. 3.  Aortic atherosclerosis (ICD10-I70.0) CT abdomen pelvis: 1. Peripancreatic edema raising the possibility of uncomplicated acute pancreatitis. Correlate clinically. 2. Somewhat distended and thick-walled appearance of the gallbladder which could be sympathetic due to adjacent pancreatitis versus representing changes of acute cholecystitis. 3. Normal size spleen with splenic granulomata. 4. Water attenuating cyst off the lower pole the  right kidney measuring 19 mm. 5. Abnormal segmental thickening of the ascending colon, neoplasm versus marked inflammatory change/colitis. 6. Enlarged prostate. 7. Moderate aortoiliac and branch vessel atherosclerosis without aneurysm. No adenopathy. 8. Small fat containing left inguinal hernia and periumbilical fat containing hernia. Electronically Signed   By: Ashley Royalty M.D.   On: 01/23/2018 20:36   Ct Abdomen Pelvis W Contrast  Result Date: 01/23/2018 CLINICAL DATA:  Hypotension and dizziness. Possible malignant tumor found in colon on colonoscopy earlier today. EXAM: CT CHEST, ABDOMEN, AND PELVIS WITH CONTRAST TECHNIQUE: Multidetector CT imaging of the chest, abdomen and pelvis was performed following the standard protocol during bolus administration of intravenous contrast. CONTRAST:  11mL OMNIPAQUE IOHEXOL 300 MG/ML  SOLN COMPARISON:  CXR 12/29/2014 FINDINGS: CT CHEST FINDINGS Cardiovascular: Conventional branch pattern of the great vessels with atherosclerosis. No significant stenosis. There is moderate atherosclerosis of the thoracic aorta without aneurysm. Left main and three-vessel coronary arteriosclerosis of the native coronary arteries identified with post CABG change. Left-sided ICD device with leads in the right atrium, right ventricle and coronary sinus. No pericardial effusion. There is cardiomegaly. No large central pulmonary embolus. Mediastinum/Nodes: Small subcentimeter short axis lymph nodes within the mediastinum and both hila. Mild  subcarinal lymphadenopathy up to 18 mm short axis. Trachea and mainstem bronchi are patent. Esophagus is unremarkable. Lungs/Pleura: Small right greater than left pleural effusions. Patchy ground-glass opacities are noted throughout both lungs with mild peribronchial thickening. Findings are nonspecific but atypical pneumonia, alveolitis or pneumonitis are among some possibilities or stigmata of CHF given the small pleural effusions and cardiomegaly. No  pneumothorax. No dominant mass is identified. Musculoskeletal: No aggressive osseous lesions or fracture. CT ABDOMEN PELVIS FINDINGS Hepatobiliary: Distended gallbladder with mural thickening and edema raising concern for cholecystitis. Sympathetic thickening from possible adjacent pancreatitis given peripancreatic edema is also a possibility. The liver is unremarkable and free of enhancing mass lesions. No biliary dilatation. Pancreas: Peripancreatic edema is noted without space-occupying mass of the pancreas or ductal dilatation. Pancreatitis is a possibility without complicating features. Spleen: Splenic granulomas.  No splenomegaly. Adrenals/Urinary Tract: Normal bilateral adrenal glands. Water attenuating cyst off the lower pole of the right kidney measuring 19 mm in diameter. No nephrolithiasis nor hydroureteronephrosis. The urinary bladder is unremarkable for the degree of distention. Stomach/Bowel: Physiologic distention of the stomach with normal small bowel rotation and ligament of Treitz position. No small bowel obstruction or inflammation. Distal and terminal ileum are normal. Segmental thickening of the ascending colon is identified either representing neoplastic enlargement and thickening or focal inflammatory thickening suggest colitis. Clinical correlation with findings at colonoscopy recommended. Appendectomy. Vascular/Lymphatic: Moderate aortoiliac and branch vessel atherosclerosis without aneurysm. Suboptimal opacification of the splenic and portal veins. Small porta hepatic subcentimeter short axis lymph nodes. No adenopathy by CT size criteria. Reproductive: Enlarged prostate measuring 6.6 x 6 x 5.6 cm. Other: Small fat containing left inguinal hernia. Small periumbilical fat containing hernia. Musculoskeletal: No aggressive osseous lesions. Mild degenerative change along the thoracolumbar spine. IMPRESSION: Chest CT: 1. Patchy ground-glass opacities throughout the visualized lungs potentially  representing stigmata of CHF given cardiomegaly and small bilateral pleural effusions. Alveolitis or pneumonitis and/or potentially hypoventilatory change versus atypical pneumonia are some other possible considerations. No dominant mass. 2. Status post CABG with ICD device in place. Coronary arteriosclerosis of the native coronary arteries. 3.  Aortic atherosclerosis (ICD10-I70.0) CT abdomen pelvis: 1. Peripancreatic edema raising the possibility of uncomplicated acute pancreatitis. Correlate clinically. 2. Somewhat distended and thick-walled appearance of the gallbladder which could be sympathetic due to adjacent pancreatitis versus representing changes of acute cholecystitis. 3. Normal size spleen with splenic granulomata. 4. Water attenuating cyst off the lower pole the right kidney measuring 19 mm. 5. Abnormal segmental thickening of the ascending colon, neoplasm versus marked inflammatory change/colitis. 6. Enlarged prostate. 7. Moderate aortoiliac and branch vessel atherosclerosis without aneurysm. No adenopathy. 8. Small fat containing left inguinal hernia and periumbilical fat containing hernia. Electronically Signed   By: Ashley Royalty M.D.   On: 01/23/2018 20:36    Assessment and Plan:   CAD -History of remote CABG 1986. Followed at [facility].  -Recent c/o fatigue, no CP.  Ischemic cardiomyopathy -Followed at [facility] -EF has been down to 15% in the past. Has CRT-D. Most recent echo at [facility] in 09/2016 showed EF 30% -Echo cone here on 01/24/18 showed EF 20%, with difuse hypokinesis, mild-mod MR, mildly dilated bil atria.  -Treatment with carvedilol 12.5 mg twice daily, Lasix 40 mg daily.  He is not treated with Spironolactone due to hypotension. Entresto 24-26 mg recently switched to losartan 25 due to hypotension with fatigue and unsteadiness.  -Losartan and carvedilol currently on hold due to hypotension. Pt was orthostatic on presentation.  -CXR showed small  bilateral pleural effusions -He is  currently receiving lasix 40 mg IV BID.  -Monitor strict I&O and daily wts.  -Currently appears euvolemic -BP is currently stable. Resume carvedilol.  -Will put scheduled lasix on hold pending surgery. Evaluate for need for lasix on a daily basis. Once pt stable, resume daily dosing.  -Will get baseline BNP  Anemia -admitted to [facility] hospital on 01/21/2018 with weakness and hypotension. Hgb 7.7.   -He received Kcentra to reverse his anticoagulation and was transfused with packed red blood cells.   -Anticoagulation currently on hold.  -He was seen by GI and an EGD showed gastritis.  Colonoscopy showed a colon mass in the ascending colon.  Mass biopsy showed adenocarcinoma.  He was transferred to [facility] on 01/25/2018 with plans for a colon resection.  -Hgb 8.2 today. Labs show low iron and TIBC.   Chronic Atrial fibrillation -Status post pulmonary vein ablation 2010, A. fib ablation 2012 and DCCV 2016.  Weaned off of sotalol and was asymptomatic as of April of this year -Currently he is pacing (BiV) at 60 bpm, appears to be afib underlying -Rate controlled with BB. -CHA2DS2/VAS Stroke Risk Score of 6 (CHF, CAD, HTN, Age (2), DM). He is chronically anticoagulated with coumadin.  Anticoagulation currently on hold in setting of anemia, GI bleed -Severely enlarged bilateral atria on prior echo. Mildly dilated atria on current echo.  -Resume anticoagulation as soon as [place] with surgery.   CKD -Scr 1.58, appears to be at baseline.  -Monitor closely with diuretic and surgery.   AICD -CRT-D therapy. [employer] followed at [facility]. Implanted 09/11/2012. -I have asked Tomi Bamberger from [employer] to interrogate the ICD and see if there is info on volume status.   Hyperlipidemia -On Crestor 10 mg daily and Zetia. LDL was 34 in 11/2016. Continue current therapy.   Preoperative evaluation -Renal function is stable with SCr <2. Not on insulin. With his hx of CAD and CHF, according to the  revised cardiac risk index the patient is a class IV risk with approximately 11% risk of major cardiac event.  -The patient is at high risk for cardiac complications with any surgery. He will need close hemodynamic monitoring and volume management considering his LV systolic dysfunction.  -He should be maintained on beta blocker and statin throughout perioperative period to reduce his cardiac risk. Lasix as needed.   For questions or updates, please contact [place] Please consult [email or web address] for contact info under Palmer/STEMI.   Signed, Daune Perch, NP  01/26/2018 8:37 AM

## 2018-01-26 NOTE — Progress Notes (Addendum)
PROGRESS NOTE                                                                                                                                                                                                             Patient Demographics:    Luis Palmer, is a 84 y.o. male, DOB - 1932-07-08, HKV:[id number]  Admit date - 01/25/2018   Admitting Physician Karmen Bongo, MD  Outpatient Primary MD for the patient is Sparks, Leonie Douglas, MD  LOS - 1  Outpatient Specialists: Cardiology at [facility]  No chief complaint on file.      Brief Narrative 84 year old male with history of severe ischemic cardiomyopathy status post ICD, A. fib post ablation x2, CAD with history of CABG, chronic systolic CHF and chronic A. fib on anticoagulation was admitted to [facility] regional hospital on 8/25 with acute onset of bright red blood per rectum.  He was given Kcentra to receive anticoagulation and transfused PRBCs.  Patient underwent colonoscopy showing colon mass in the ascending colon that was biopsied showing adenocarcinoma of the colon.  Seen by surgery who recommended resection.  Patient was consulted by cardiology who cleared for surgery.  CT of the chest showed findings of CHF which seemed stable.  However patient and family wanted to be transferred to [facility] for surgery given concern for extensive heart disease. Patient admitted to hospitalist service and surgery consulted.    Subjective:   Patient denies any abdominal pain but reports significant dyspnea on exertion when trying to get out of bed or moving.  Denies orthopnea or PND. No further blood per rectum.   Assessment  & Plan :   Principal problem Lower GI bleed with adenocarcinoma of the colon No further active bleeding.  Coumadin on hold and was reversed at [facility].  Hemoglobin stable (received PRBC transfusion at [facility]). Monitor H&H closely.  Type and  screen.  INR 1.86 today ( received vitamin k today) Seen by surgery with plan on OR early next week (9/3) as patient needs to get bowel prepped as well.  Continue clears for now.  Acute blood loss anemia Secondary to rectal bleed with adenocarcinoma.  Hemoglobin stable at 8.2 today.  Has low iron stores.  Transfuse as needed.  EGD showed gastritis.    Ischemic cardiomyopathy EF of 20%   Has V pacemaker.  No anginal symptoms or an acute CHF.  Dyspnea on exertion likely due to symptomatic anemia. Cardiology consult appreciated.  Cleared for general anesthesia but recommend patient to be high risk given significant cardiac issues.  Recommend to continue beta-blocker, hold ARB until after surgery.  Continue twice daily Lasix.  Pacemaker interrogation. Monitor strict I/O and daily weight.  Chronic A. fib Continue beta-blocker.  Coumadin held and INR reversed.  Rate controlled on telemetry.  Chronic kidney disease stage III Function seems at baseline.  Monitor.  Essential hypertension Blood pressure stable.  Holding ARB.  Continue beta-blocker.  Mild dementia Continue Aricept  CAD with history of CABG No chest pain symptoms.  Continue beta-blocker and statin.  Diabetes mellitus type 2, controlled Not on home meds.  Monitor with sliding scale coverage.     Code Status : Full code  Family Communication  : Wife at bedside  Disposition Plan  : Pending hospital course and surgery next week  Barriers For Discharge : Active symptoms  Consults  : Surgery, cardiology  Procedures  : None  DVT Prophylaxis  : SCDs  Lab Results  Component Value Date   PLT 127 (L) 01/26/2018    Antibiotics  :    Anti-infectives (From admission, onward)   None        Objective:   Vitals:   01/25/18 2023 01/25/18 2208 01/26/18 0528 01/26/18 0600  BP: 124/66 115/70 (!) 111/47   Pulse:  76 (!) 59   Resp:      Temp:  97.8 F (36.6 C) 97.8 F (36.6 C)   TempSrc:  Oral Oral   SpO2:  100% 95%    Weight:    106.5 kg  Height: 6' (1.829 m)       Wt Readings from Last 3 Encounters:  01/26/18 106.5 kg  01/21/18 99.8 kg  12/06/17 103.5 kg     Intake/Output Summary (Last 24 hours) at 01/26/2018 1332 Last data filed at 01/26/2018 0130 Gross per 24 hour  Intake 295 ml  Output 800 ml  Net -505 ml     Physical Exam  Gen: not in distress, fatigued HEENT: Pallor present moist mucosa, supple neck Chest: clear b/l, no added sounds CVS: S1-S2 regular, no murmurs GI: soft, NT, ND, BS+ Musculoskeletal: warm, no edema     Data Review:    CBC Recent Labs  Lab 01/22/18 0307 01/22/18 1229 01/23/18 0418 01/24/18 0337 01/25/18 0508 01/26/18 0519  WBC 5.6  --  8.5 7.2 8.7 6.2  HGB 9.0* 9.1* 8.9* 8.7* 9.3* 8.2*  HCT 26.0*  --  26.5* 25.6* 27.1* 25.8*  PLT 130*  --  129* 115* 127* 127*  MCV 83.2  --  84.0 83.7 85.0 87.5  MCH 28.6  --  28.4 28.5 29.1 27.8  MCHC 34.4  --  33.8 34.1 34.3 31.8  RDW 16.6*  --  17.1* 17.5* 18.0* 17.2*    Chemistries  Recent Labs  Lab 01/22/18 0307 01/23/18 0418 01/24/18 0337 01/25/18 0508 01/26/18 0519  NA 139 138 139 139 138  K 4.0 4.1 4.0 4.2 3.8  CL 112* 112* 114* 110 110  CO2 23 18* 19* 20* 21*  GLUCOSE 106* 205* 133* 155* 147*  BUN 33* 30* 29* 28* 25*  CREATININE 1.31* 1.53* 1.47* 1.60* 1.58*  CALCIUM 8.3* 8.1* 7.9* 8.2* 8.2*  AST  --   --   --  37  --   ALT  --   --   --  42  --  ALKPHOS  --   --   --  46  --   BILITOT  --   --   --  1.2  --    ------------------------------------------------------------------------------------------------------------------ No results for input(s): CHOL, HDL, LDLCALC, TRIG, CHOLHDL, LDLDIRECT in the last 72 hours.  Lab Results  Component Value Date   HGBA1C 6.7 (H) 07/28/2013   ------------------------------------------------------------------------------------------------------------------ No results for input(s): TSH, T4TOTAL, T3FREE, THYROIDAB in the last 72 hours.  Invalid  input(s): FREET3 ------------------------------------------------------------------------------------------------------------------ Recent Labs    01/26/18 0519  TIBC 211*  IRON 17*    Coagulation profile Recent Labs  Lab 01/22/18 1514 01/23/18 0418 01/24/18 0337 01/25/18 0508 01/26/18 0519  INR 1.25 1.43 1.56 1.74 1.86    No results for input(s): DDIMER in the last 72 hours.  Cardiac Enzymes No results for input(s): CKMB, TROPONINI, MYOGLOBIN in the last 168 hours.  Invalid input(s): CK ------------------------------------------------------------------------------------------------------------------    Component Value Date/Time   BNP 1,290.3 (H) 01/26/2018 1051    Inpatient Medications  Scheduled Meds: . carvedilol  12.5 mg Oral BID WC  . cholecalciferol  2,000 Units Oral Daily  . donepezil  10 mg Oral QHS  . ezetimibe  10 mg Oral QHS  . furosemide  40 mg Intravenous BID  . insulin aspart  0-5 Units Subcutaneous QHS  . insulin aspart  0-9 Units Subcutaneous TID WC  . pantoprazole  40 mg Oral BID  . polyethylene glycol  17 g Oral Daily  . rosuvastatin  10 mg Oral Daily  . sodium chloride flush  3 mL Intravenous Q12H   Continuous Infusions: . sodium chloride    . phytonadione (VITAMIN K) IV 5 mg (01/26/18 1306)   PRN Meds:.sodium chloride, acetaminophen **OR** acetaminophen, ondansetron **OR** ondansetron (ZOFRAN) IV, sodium chloride flush  Micro Results No results found for this or any previous visit (from the past 240 hour(s)).  Radiology Reports Dg Chest 2 View  Result Date: 01/25/2018 CLINICAL DATA:  Congestive heart failure EXAM: CHEST - 2 VIEW COMPARISON:  CT 01/23/2018 FINDINGS: Cardiomegaly with moderate aortic atherosclerosis. Left-sided ICD device projects over the mid left hemithorax. Leads in the right atrium, right ventricle and coronary sinus are noted. Small bilateral pleural effusions blunting the costophrenic angles. No overt pulmonary edema.  Mild interstitial change of the lungs. IMPRESSION: Cardiomegaly with aortic atherosclerosis. Small bilateral pleural effusions. No active pulmonary disease. Electronically Signed   By: Ashley Royalty M.D.   On: 01/25/2018 22:53   Ct Chest W Contrast  Result Date: 01/23/2018 CLINICAL DATA:  Hypotension and dizziness. Possible malignant tumor found in colon on colonoscopy earlier today. EXAM: CT CHEST, ABDOMEN, AND PELVIS WITH CONTRAST TECHNIQUE: Multidetector CT imaging of the chest, abdomen and pelvis was performed following the standard protocol during bolus administration of intravenous contrast. CONTRAST:  46mL OMNIPAQUE IOHEXOL 300 MG/ML  SOLN COMPARISON:  CXR 12/29/2014 FINDINGS: CT CHEST FINDINGS Cardiovascular: Conventional branch pattern of the great vessels with atherosclerosis. No significant stenosis. There is moderate atherosclerosis of the thoracic aorta without aneurysm. Left main and three-vessel coronary arteriosclerosis of the native coronary arteries identified with post CABG change. Left-sided ICD device with leads in the right atrium, right ventricle and coronary sinus. No pericardial effusion. There is cardiomegaly. No large central pulmonary embolus. Mediastinum/Nodes: Small subcentimeter short axis lymph nodes within the mediastinum and both hila. Mild subcarinal lymphadenopathy up to 18 mm short axis. Trachea and mainstem bronchi are patent. Esophagus is unremarkable. Lungs/Pleura: Small right greater than left pleural effusions. Patchy ground-glass opacities are  noted throughout both lungs with mild peribronchial thickening. Findings are nonspecific but atypical pneumonia, alveolitis or pneumonitis are among some possibilities or stigmata of CHF given the small pleural effusions and cardiomegaly. No pneumothorax. No dominant mass is identified. Musculoskeletal: No aggressive osseous lesions or fracture. CT ABDOMEN PELVIS FINDINGS Hepatobiliary: Distended gallbladder with mural thickening  and edema raising concern for cholecystitis. Sympathetic thickening from possible adjacent pancreatitis given peripancreatic edema is also a possibility. The liver is unremarkable and free of enhancing mass lesions. No biliary dilatation. Pancreas: Peripancreatic edema is noted without space-occupying mass of the pancreas or ductal dilatation. Pancreatitis is a possibility without complicating features. Spleen: Splenic granulomas.  No splenomegaly. Adrenals/Urinary Tract: Normal bilateral adrenal glands. Water attenuating cyst off the lower pole of the right kidney measuring 19 mm in diameter. No nephrolithiasis nor hydroureteronephrosis. The urinary bladder is unremarkable for the degree of distention. Stomach/Bowel: Physiologic distention of the stomach with normal small bowel rotation and ligament of Treitz position. No small bowel obstruction or inflammation. Distal and terminal ileum are normal. Segmental thickening of the ascending colon is identified either representing neoplastic enlargement and thickening or focal inflammatory thickening suggest colitis. Clinical correlation with findings at colonoscopy recommended. Appendectomy. Vascular/Lymphatic: Moderate aortoiliac and branch vessel atherosclerosis without aneurysm. Suboptimal opacification of the splenic and portal veins. Small porta hepatic subcentimeter short axis lymph nodes. No adenopathy by CT size criteria. Reproductive: Enlarged prostate measuring 6.6 x 6 x 5.6 cm. Other: Small fat containing left inguinal hernia. Small periumbilical fat containing hernia. Musculoskeletal: No aggressive osseous lesions. Mild degenerative change along the thoracolumbar spine. IMPRESSION: Chest CT: 1. Patchy ground-glass opacities throughout the visualized lungs potentially representing stigmata of CHF given cardiomegaly and small bilateral pleural effusions. Alveolitis or pneumonitis and/or potentially hypoventilatory change versus atypical pneumonia are some  other possible considerations. No dominant mass. 2. Status post CABG with ICD device in place. Coronary arteriosclerosis of the native coronary arteries. 3.  Aortic atherosclerosis (ICD10-I70.0) CT abdomen pelvis: 1. Peripancreatic edema raising the possibility of uncomplicated acute pancreatitis. Correlate clinically. 2. Somewhat distended and thick-walled appearance of the gallbladder which could be sympathetic due to adjacent pancreatitis versus representing changes of acute cholecystitis. 3. Normal size spleen with splenic granulomata. 4. Water attenuating cyst off the lower pole the right kidney measuring 19 mm. 5. Abnormal segmental thickening of the ascending colon, neoplasm versus marked inflammatory change/colitis. 6. Enlarged prostate. 7. Moderate aortoiliac and branch vessel atherosclerosis without aneurysm. No adenopathy. 8. Small fat containing left inguinal hernia and periumbilical fat containing hernia. Electronically Signed   By: Ashley Royalty M.D.   On: 01/23/2018 20:36   Ct Abdomen Pelvis W Contrast  Result Date: 01/23/2018 CLINICAL DATA:  Hypotension and dizziness. Possible malignant tumor found in colon on colonoscopy earlier today. EXAM: CT CHEST, ABDOMEN, AND PELVIS WITH CONTRAST TECHNIQUE: Multidetector CT imaging of the chest, abdomen and pelvis was performed following the standard protocol during bolus administration of intravenous contrast. CONTRAST:  25mL OMNIPAQUE IOHEXOL 300 MG/ML  SOLN COMPARISON:  CXR 12/29/2014 FINDINGS: CT CHEST FINDINGS Cardiovascular: Conventional branch pattern of the great vessels with atherosclerosis. No significant stenosis. There is moderate atherosclerosis of the thoracic aorta without aneurysm. Left main and three-vessel coronary arteriosclerosis of the native coronary arteries identified with post CABG change. Left-sided ICD device with leads in the right atrium, right ventricle and coronary sinus. No pericardial effusion. There is cardiomegaly. No large  central pulmonary embolus. Mediastinum/Nodes: Small subcentimeter short axis lymph nodes within the mediastinum and both hila.  Mild subcarinal lymphadenopathy up to 18 mm short axis. Trachea and mainstem bronchi are patent. Esophagus is unremarkable. Lungs/Pleura: Small right greater than left pleural effusions. Patchy ground-glass opacities are noted throughout both lungs with mild peribronchial thickening. Findings are nonspecific but atypical pneumonia, alveolitis or pneumonitis are among some possibilities or stigmata of CHF given the small pleural effusions and cardiomegaly. No pneumothorax. No dominant mass is identified. Musculoskeletal: No aggressive osseous lesions or fracture. CT ABDOMEN PELVIS FINDINGS Hepatobiliary: Distended gallbladder with mural thickening and edema raising concern for cholecystitis. Sympathetic thickening from possible adjacent pancreatitis given peripancreatic edema is also a possibility. The liver is unremarkable and free of enhancing mass lesions. No biliary dilatation. Pancreas: Peripancreatic edema is noted without space-occupying mass of the pancreas or ductal dilatation. Pancreatitis is a possibility without complicating features. Spleen: Splenic granulomas.  No splenomegaly. Adrenals/Urinary Tract: Normal bilateral adrenal glands. Water attenuating cyst off the lower pole of the right kidney measuring 19 mm in diameter. No nephrolithiasis nor hydroureteronephrosis. The urinary bladder is unremarkable for the degree of distention. Stomach/Bowel: Physiologic distention of the stomach with normal small bowel rotation and ligament of Treitz position. No small bowel obstruction or inflammation. Distal and terminal ileum are normal. Segmental thickening of the ascending colon is identified either representing neoplastic enlargement and thickening or focal inflammatory thickening suggest colitis. Clinical correlation with findings at colonoscopy recommended. Appendectomy.  Vascular/Lymphatic: Moderate aortoiliac and branch vessel atherosclerosis without aneurysm. Suboptimal opacification of the splenic and portal veins. Small porta hepatic subcentimeter short axis lymph nodes. No adenopathy by CT size criteria. Reproductive: Enlarged prostate measuring 6.6 x 6 x 5.6 cm. Other: Small fat containing left inguinal hernia. Small periumbilical fat containing hernia. Musculoskeletal: No aggressive osseous lesions. Mild degenerative change along the thoracolumbar spine. IMPRESSION: Chest CT: 1. Patchy ground-glass opacities throughout the visualized lungs potentially representing stigmata of CHF given cardiomegaly and small bilateral pleural effusions. Alveolitis or pneumonitis and/or potentially hypoventilatory change versus atypical pneumonia are some other possible considerations. No dominant mass. 2. Status post CABG with ICD device in place. Coronary arteriosclerosis of the native coronary arteries. 3.  Aortic atherosclerosis (ICD10-I70.0) CT abdomen pelvis: 1. Peripancreatic edema raising the possibility of uncomplicated acute pancreatitis. Correlate clinically. 2. Somewhat distended and thick-walled appearance of the gallbladder which could be sympathetic due to adjacent pancreatitis versus representing changes of acute cholecystitis. 3. Normal size spleen with splenic granulomata. 4. Water attenuating cyst off the lower pole the right kidney measuring 19 mm. 5. Abnormal segmental thickening of the ascending colon, neoplasm versus marked inflammatory change/colitis. 6. Enlarged prostate. 7. Moderate aortoiliac and branch vessel atherosclerosis without aneurysm. No adenopathy. 8. Small fat containing left inguinal hernia and periumbilical fat containing hernia. Electronically Signed   By: Ashley Royalty M.D.   On: 01/23/2018 20:36    Time Spent in minutes  35   Tiyon Sanor M.D on 01/26/2018 at 1:32 PM  Between 7am to 7pm - Pager - [phone]  After 7pm go to [email or web address] -  password [facility]  [facility] -  Office  [phone]

## 2018-01-26 NOTE — Consult Note (Signed)
[facility] Consult Note  Luis Palmer 1932-09-05  [id number].    Requesting MD: Luis Palmer Chief Complaint/Reason for Consult: Transverse colon adenocarcinoma  HPI:  Patient is a 84 year old male who was transferred to [facility] from [facility] at family request. Patient while at [facility] was found to have a transverse colon mass which was biopsied and pathology came back as adenocarcinoma of Luis colon. For Luis last several weeks patient has been experiencing DOE, increased fatigue and increased SOB. Patient also was noted to have some darker stools prior to admission. Patient is on Coumadin chronically for atrial fibrillation. Patient anemic on admission to Luis [facility]. Patient reportedly had two bloody BMs shortly after admission to [facility] 8/25. He received KCentra to reverse coumadin and was transfused PRBC. No further bloody BMs since stopping anticoagulation. Patient denies abdominal pain, nausea or vomiting. Does report that he would have occasional constipation that usually resolved with prune juice.   PMH also significant for severe ischemic cardiomyopathy with implanted AICD, chronic systolic CHF, Hx of CABG in Luis 1980s at [facility]. Primary cardiologist is at [facility]. Patient denies alcohol, current tobacco, or illicit drug use. Allergic to NSAIDs, pseudoephedrine, ACE inhibitors. Past abdominal surgery includes an appendectomy when he was 84 years old.   ROS: Review of Systems  Constitutional: Positive for malaise/fatigue. Negative for chills, fever and weight loss.  Respiratory: Positive for shortness of breath.   Cardiovascular: Negative for chest pain and palpitations.  Gastrointestinal: Positive for constipation and melena. Negative for abdominal pain, blood in stool, diarrhea, nausea and vomiting.  Genitourinary: Negative for dysuria, frequency and urgency.  Musculoskeletal: Negative for falls.  Neurological: Positive for dizziness (positional ) and weakness.  All other systems reviewed and  are negative.   Family History  Problem Relation Age of Onset  . Congestive Heart Failure Mother   . Diabetes Father   . Stroke Father   . Lung cancer Brother     Past Medical History:  Diagnosis Date  . Anemia   . Atrial fibrillation ([place])   . CHF (congestive heart failure) ([place])   . Diabetes mellitus without complication ([place])   . Hx of CABG     Past Surgical History:  Procedure Laterality Date  . ABLATION     X 2 FOR A-FIB  . APPENDECTOMY    . CARDIAC DEFIBRILLATOR PLACEMENT    . CARDIAC SURGERY    . COLONOSCOPY WITH PROPOFOL N/A 01/23/2018   Procedure: COLONOSCOPY WITH PROPOFOL;  Surgeon: Luis Lame, MD;  Location: [facility];  Service: [facility];  Laterality: N/A;  . CORONARY ARTERY BYPASS GRAFT    . ESOPHAGOGASTRODUODENOSCOPY (EGD) WITH PROPOFOL N/A 01/22/2018   Procedure: ESOPHAGOGASTRODUODENOSCOPY (EGD) WITH PROPOFOL;  Surgeon: Luis Lame, MD;  Location: [facility];  Service: [facility];  Laterality: N/A;  . INSERT / REPLACE / REMOVE PACEMAKER    . PACEMAKER IMPLANT      Social History:  reports that he quit smoking about 33 years ago. His smoking use included cigarettes. He has a 40.00 pack-year smoking history. He has never used smokeless tobacco. He reports that he has current or past drug history. He reports that he does not drink alcohol.  Allergies:  Allergies  Allergen Reactions  . Pseudoephedrine Hcl Other (See Comments)    Other Reaction: ANURIA  . Ace Inhibitors Cough and Other (See Comments)    cough cough   . Nsaids Other (See Comments)    Reaction:  Fluid retention   . Vasotec [Enalapril] Cough  Medications Prior to Admission  Medication Sig Dispense Refill  . betamethasone dipropionate 0.05 % lotion Apply 1 application topically daily as needed for itching or dry skin.    . carvedilol (COREG) 12.5 MG tablet Take 12.5 mg by mouth 2 (two) times daily.    . Cholecalciferol (VITAMIN D) 2000 UNITS CAPS Take 1 capsule by mouth daily.     Marland Kitchen donepezil (ARICEPT) 10 MG tablet Take 10 mg by mouth at bedtime.    Marland Kitchen ezetimibe (ZETIA) 10 MG tablet Take 10 mg by mouth at bedtime.    . furosemide (LASIX) 40 MG tablet Take 40 mg by mouth daily.    Luis Palmer Polysacch (ALCORTIN A) 1-2-1 % GEL Apply 1 application topically as needed for dry skin.  2  . losartan (COZAAR) 25 MG tablet Take 25 mg by mouth daily.    . meclizine (ANTIVERT) 25 MG tablet Take 25 mg by mouth daily.     . pantoprazole (PROTONIX) 40 MG tablet Take 1 tablet (40 mg total) by mouth 2 (two) times daily.    . rosuvastatin (CRESTOR) 10 MG tablet Take 10 mg by mouth daily.      Blood pressure (!) 111/47, pulse (!) 59, temperature 97.8 F (36.6 C), temperature source Oral, resp. rate 20, height 6' (1.829 m), weight 106.5 kg, SpO2 95 %. Physical Exam: Physical Exam  Constitutional: He is oriented to person, place, and time. He appears well-developed and well-nourished. He is cooperative.  Non-toxic appearance. No distress.  HENT:  Head: Normocephalic and atraumatic.  Right Ear: External ear normal.  Left Ear: External ear normal.  Nose: Nose normal.  Mouth/Throat: Oropharynx is clear and moist and mucous membranes are normal.  Eyes: Conjunctivae, EOM and lids are normal. No scleral icterus.  Pupils equal and round   Neck: Normal range of motion and phonation normal. Neck supple.  Cardiovascular: Normal rate and regular rhythm.  Pulses:      Radial pulses are 2+ on Luis right side, and 2+ on Luis left side.       Dorsalis pedis pulses are 2+ on Luis right side, and 2+ on Luis left side.  No LE edema bilaterally   Pulmonary/Chest: Effort normal. He has decreased breath sounds (bilaterally ).  Abdominal: Soft. Bowel sounds are normal. He exhibits no distension. There is no hepatosplenomegaly. There is no tenderness. There is no rigidity, no rebound and no guarding. No hernia.  Musculoskeletal:  ROM grossly intact in bilateral upper and lower extremities   Neurological: He is alert and oriented to person, place, and time. No sensory deficit.  Skin: Skin is warm, dry and intact. He is not diaphoretic. There is pallor.  Psychiatric: He has a normal mood and affect. His speech is normal and behavior is normal.    Results for orders placed or performed during Luis Palmer encounter of 01/25/18 (from Luis past 48 hour(s))  Glucose, capillary     Status: Abnormal   Collection Time: 01/25/18  9:27 PM  Result Value Ref Range   Glucose-Capillary 167 (H) 70 - 99 mg/dL  Basic metabolic panel     Status: Abnormal   Collection Time: 01/26/18  5:19 AM  Result Value Ref Range   Sodium 138 135 - 145 mmol/L   Potassium 3.8 3.5 - 5.1 mmol/L   Chloride 110 98 - 111 mmol/L   CO2 21 (L) 22 - 32 mmol/L   Glucose, Bld 147 (H) 70 - 99 mg/dL   BUN 25 (H) 8 -  23 mg/dL   Creatinine, Ser 1.58 (H) 0.61 - 1.24 mg/dL   Calcium 8.2 (L) 8.9 - 10.3 mg/dL   GFR calc non Af Amer 39 (L) >60 mL/min   GFR calc Af Amer 45 (L) >60 mL/min    Comment: (NOTE) Luis eGFR has been calculated using Luis CKD EPI equation. This calculation has not been validated in all clinical situations. eGFR's persistently <60 mL/min signify possible Chronic Kidney Disease.    Anion gap 7 5 - 15    Comment: Performed at [facility] [address] [place], [place] [address]  CBC     Status: Abnormal   Collection Time: 01/26/18  5:19 AM  Result Value Ref Range   WBC 6.2 4.0 - 10.5 K/uL   RBC 2.95 (L) 4.22 - 5.81 MIL/uL   Hemoglobin 8.2 (L) 13.0 - 17.0 g/dL   HCT 25.8 (L) 39.0 - 52.0 %   MCV 87.5 78.0 - 100.0 fL   MCH 27.8 26.0 - 34.0 pg   MCHC 31.8 30.0 - 36.0 g/dL   RDW 17.2 (H) 11.5 - 15.5 %   Platelets 127 (L) 150 - 400 K/uL    Comment: Performed at [facility], [place] [address], [place], [place] [address]  Protime-INR     Status: Abnormal   Collection Time: 01/26/18  5:19 AM  Result Value Ref Range   Prothrombin Time 21.3 (H) 11.4 - 15.2 seconds   INR 1.86      Comment: Performed at [facility] [address], [place], [place] [address]  Iron and TIBC     Status: Abnormal   Collection Time: 01/26/18  5:19 AM  Result Value Ref Range   Iron 17 (L) 45 - 182 ug/dL   TIBC 211 (L) 250 - 450 ug/dL   Saturation Ratios 8 (L) 17.9 - 39.5 %   UIBC 194 ug/dL    Comment: Performed at [facility], [place] [address], [place], [place] [address]   Dg Chest 2 View  Result Date: 01/25/2018 CLINICAL DATA:  Congestive heart failure EXAM: CHEST - 2 VIEW COMPARISON:  CT 01/23/2018 FINDINGS: Cardiomegaly with moderate aortic atherosclerosis. Left-sided ICD device projects over Luis mid left hemithorax. Leads in Luis right atrium, right ventricle and coronary sinus are noted. Small bilateral pleural effusions blunting Luis costophrenic angles. No overt pulmonary edema. Mild interstitial change of Luis lungs. IMPRESSION: Cardiomegaly with aortic atherosclerosis. Small bilateral pleural effusions. No active pulmonary disease. Electronically Signed   By: Ashley Royalty M.D.   On: 01/25/2018 22:53      Assessment/Plan Type 2 diabetes mellitus - SSI Atrial fibrillation - on heparin giving one dose vitK, INR 1.86 - ideally we want INR less than 1.5 at time of surgery Chronic systolic CHF - last EF around 20%, management of fluid status per cardiology Hx of CABG Acute on Chronic Anemia - likely secondary to GI losses - H/H 8.2/25.8, continue to monitor and transfuse per primary team  Transverse colon adenocarcinoma  - will plan for transverse colectomy likely Tuesday next week - in Luis meantime, clears only with daily miralax - will need daily INR checks, INR needs to be <1.5 prior to surgery  FEN: CLD VTE: SCDs, heparin gtt ID: no current abx  Brigid Re, [facility] 01/26/2018, 8:00 AM Pager: [phone] Consults: [phone] Mon-Fri 7:00 am-4:30 pm Sat-Sun 7:00 am-11:30 am

## 2018-01-27 LAB — BASIC METABOLIC PANEL
ANION GAP: 8 (ref 5–15)
BUN: 20 mg/dL (ref 8–23)
CHLORIDE: 109 mmol/L (ref 98–111)
CO2: 21 mmol/L — AB (ref 22–32)
Calcium: 8.6 mg/dL — ABNORMAL LOW (ref 8.9–10.3)
Creatinine, Ser: 1.48 mg/dL — ABNORMAL HIGH (ref 0.61–1.24)
GFR calc non Af Amer: 42 mL/min — ABNORMAL LOW (ref >60)
GFR, EST AFRICAN AMERICAN: 49 mL/min — AB (ref >60)
Glucose, Bld: 117 mg/dL — ABNORMAL HIGH (ref 70–99)
POTASSIUM: 3.5 mmol/L (ref 3.5–5.1)
Sodium: 138 mmol/L (ref 135–145)

## 2018-01-27 LAB — CBC
HCT: 26.9 % — ABNORMAL LOW (ref 39.0–52.0)
HEMOGLOBIN: 8.7 g/dL — AB (ref 13.0–17.0)
MCH: 28.2 pg (ref 26.0–34.0)
MCHC: 32.3 g/dL (ref 30.0–36.0)
MCV: 87.1 fL (ref 78.0–100.0)
Platelets: 127 10*3/uL — ABNORMAL LOW (ref 150–400)
RBC: 3.09 MIL/uL — AB (ref 4.22–5.81)
RDW: 17 % — ABNORMAL HIGH (ref 11.5–15.5)
WBC: 5.5 10*3/uL (ref 4.0–10.5)

## 2018-01-27 LAB — PROTIME-INR
INR: 1.41
Prothrombin Time: 17.1 seconds — ABNORMAL HIGH (ref 11.4–15.2)

## 2018-01-27 LAB — GLUCOSE, CAPILLARY
GLUCOSE-CAPILLARY: 108 mg/dL — AB (ref 70–99)
Glucose-Capillary: 172 mg/dL — ABNORMAL HIGH (ref 70–99)
Glucose-Capillary: 137 mg/dL — ABNORMAL HIGH (ref 70–99)
Glucose-Capillary: 174 mg/dL — ABNORMAL HIGH (ref 70–99)

## 2018-01-27 NOTE — Progress Notes (Signed)
Subjective No acute events. Denies n/v. Denies abdominal pain. Passing flatus; BM x2 since arrival. Denies complaints this morning.  Objective: Vital signs in last 24 hours: Temp:  [97.5 F (36.4 C)-98.9 F (37.2 C)] 98.4 F (36.9 C) (08/31 0540) Pulse Rate:  [59-64] 62 (08/31 0540) Resp:  [18] 18 (08/30 1411) BP: (112-121)/(56-65) 121/65 (08/31 0540) SpO2:  [96 %-100 %] 96 % (08/31 0540) Last BM Date: 01/25/18  Intake/Output from previous day: 08/30 0701 - 08/31 0700 In: 2400 [P.O.:2400] Out: 1250 [Urine:1250] Intake/Output this shift: No intake/output data recorded.  Gen: NAD, comfortable CV: RRR Pulm: Normal work of breathing Abd: Obese, soft, NT. Ext: SCDs in place  Lab Results: CBC  Recent Labs    01/26/18 0519 01/27/18 0434  WBC 6.2 5.5  HGB 8.2* 8.7*  HCT 25.8* 26.9*  PLT 127* 127*   BMET Recent Labs    01/26/18 0519 01/27/18 0434  NA 138 138  K 3.8 3.5  CL 110 109  CO2 21* 21*  GLUCOSE 147* 117*  BUN 25* 20  CREATININE 1.58* 1.48*  CALCIUM 8.2* 8.6*   PT/INR Recent Labs    01/26/18 0519 01/27/18 0434  LABPROT 21.3* 17.1*  INR 1.86 1.41   ABG No results for input(s): PHART, HCO3 in the last 72 hours.  Invalid input(s): PCO2, PO2  Studies/Results:  Anti-infectives: Anti-infectives (From admission, onward)   None       Assessment/Plan: Patient Active Problem List   Diagnosis Date Noted  . Adenocarcinoma of colon ([place]) 01/25/2018  . Ischemic cardiomyopathy 01/25/2018  . Acute on chronic systolic CHF (congestive heart failure) ([place]) 01/25/2018  . Biventricular implantable cardioverter-defibrillator in situ 01/25/2018  . Atrial fibrillation, chronic ([place]) 01/25/2018  . Gastrointestinal bleeding, lower 01/25/2018  . Acute posthemorrhagic anemia   . Benign neoplasm of transverse colon   . Neoplasm of digestive system   . Melena   . Gastritis without bleeding   . GI bleed 01/21/2018  . Iron deficiency anemia 10/02/2017   Mr.  Vinsant is a pleasant 8yoM with hx HTN, DM, Afib, chronic systolic CHF (last EF ~38%), prior CABG, acute on chronic anemia likely 2/2 colon cancer - here with partially obstructing ascending colon CA  -Reversal of INR underway; cardiology and TRH following for assistance in his care. High cardiac risk but cleared as no further interventions were deemed necessary for additional optimization -CT C/A/P - mass appeared more in asc colon vs transverse as indicated prior on endoscopy. No metastatic dz; CEA 8.6 -Gentle bowel prep underway; clears during this time; possible OR 9/3   LOS: 2 days   Sharon Mt. Dema Severin, M.D. [facility]

## 2018-01-27 NOTE — Progress Notes (Signed)
PROGRESS NOTE    Luis Palmer  YHC:[id number] DOB: 11/06/1932 DOA: 01/25/2018 PCP: Idelle Crouch, MD   Brief Narrative:  The patient is an 84 year old male with history of severe ischemic cardiomyopathy status post ICD, A. fib post ablation x2, CAD with history of CABG, chronic systolic CHF and chronic A. fib on anticoagulation was admitted to [facility] on 8/25 with acute onset of bright red blood per rectum.  He was given Kcentra to reverese anticoagulation and transfused PRBCs.  Patient underwent colonoscopy showing colon mass in the ascending colon that was biopsied showing adenocarcinoma of the colon.    He was seen by General Surgery who recommended resection.  Cardiology evaluated who cleared for surgery.  CT of the chest showed findings of CHF which seemed stable.  Patient and family wanted to be transferred to [facility] for surgery given concern for extensive heart disease.  Patient admitted to hospitalist service and General Surgery and Cardiology evaluated at [facility]. He is high risk for surgery but cleared and anticipated surgery to happen on 01/30/18.   Assessment & Plan:   Active Problems:   Iron deficiency anemia   GI bleed   Acute posthemorrhagic anemia   Neoplasm of digestive system   Adenocarcinoma of colon (HCC)   Ischemic cardiomyopathy   Acute on chronic systolic CHF (congestive heart failure) (HCC)   Biventricular implantable cardioverter-defibrillator in situ   Atrial fibrillation, chronic (HCC)   Gastrointestinal bleeding, lower  Lower GI bleed with adenocarcinoma of the colon -No further active bleeding.  -Coumadin on hold and was reversed at [facility].  -Hemoglobin stable (received PRBC transfusion at [facility]) and now is 8.7/26.9 -Monitor H&H closely.  Type and screen.  INR 1.41 today ( received vitamin k yesterday -Seen by surgery with plan on OR early next week (9/3) as patient needs to get bowel prepped as well and  gentle prep started per general surgery and will continue MiraLAX 17 g p.o. daily.  -Cardiology Evaluated for Pre-Op Clearance but pateint is high risk -Continue clears for now -Appreciate Surgical and Cardiology evaluation and recommendations.  Acute Blood Loss Anemia -Secondary to rectal bleed with adenocarcinoma. -Patient admits to having dark tarry stools but did not "pay any attention to it" and then had Bright Red Blood.  -Hemoglobin stable at 8.7 today.  Has low iron stores as Anemia Panel showed iron level of 17, U IBC 194, TIBC of 211, and saturation ratios of 8%.   -Transfuse as needed and if hemoglobin drops below 7 -EGD showed gastritis.    Ischemic cardiomyopathy -EF of 20%   Has V pacemaker.   -No anginal symptoms or an acute CHF.  Dyspnea on exertion likely due to symptomatic anemia. -Cardiology consult appreciated.  Cleared for general anesthesia but recommend patient to be high risk given significant cardiac issues.   -Recommend to continue beta-blocker, hold ARB until after surgery.  Continue twice daily Lasix.   -Pacemaker interrogation per Cards  -BNP on admission was 1290.3 -Monitor strict I/O and daily weight.  As of 345 mL's and is up 4 pounds from admission however wife does not feel this is accurate  Chronic A. fib -Continue beta-blocker with carvedilol 12.5 mill grams p.o. twice daily.   -Coumadin held and INR reversed.   -Rate controlled on telemetry. -Cardiology following appreciate further recommendations evaluation  Chronic kidney disease stage III -Renal function seems at baseline.   -Pain went from 28/1.60 and is now 20/1.48 -Stable -Avoid nephrotoxic  medications possible -Continue with home Lasix 40 mg p.o. twice daily as above per cardiology -We will hold ARB per cardiology recommendations  Essential hypertension -Blood pressure stable.   -Holding ARB.  -Continue Beta-Blocker as above.  Mild dementia -Continue Donepezil 10 mg p.o.  nightly  CAD with history of CABG -No chest pain symptoms.  Continue beta-blocker and statin with rosuvastatin 10 g p.o. nightly. -Holding ARB as mentioned above and is not on any aspirin at this time due to bleeding  Diabetes mellitus type 2, controlled -Not on home meds.  Monitor with sliding scale coverage.  Hyperlipidemia -Continue with Zetia and Rosuvastatin  DVT prophylaxis: SCDs as Anticoagulation with Coumadin being held Code Status: FULL CODE Family Communication: Discussed plan of care with wife at bedside Disposition Plan: Remain inpatient for surgery scheduled next week  Consultants:   Cardiology   General Surgery    Procedures:  ECHOCARDIOGRAM 01/24/18 ------------------------------------------------------------------- Study Conclusions  - Left ventricle: The cavity size was severely dilated. Systolic   function was severely reduced. The estimated ejection fraction   was 20%. Diffuse hypokinesis. - Aortic valve: Valve area (Vmax): 2.16 cm^2. - Mitral valve: There was mild to moderate regurgitation. - Left atrium: The atrium was mildly dilated. - Right ventricle: The cavity size was mildly dilated. - Right atrium: The atrium was mildly dilated. - Tricuspid valve: There was mild-moderate regurgitation.    Antimicrobials:  Anti-infectives (From admission, onward)   None     Subjective: Seen and examined at bedside and states he had no complaints of his waiting to have surgery next week.  No chest pain, shortness breath while sitting in bed, lightheadedness or dizziness.  Feels a little bit better since coming in.  No other concerns or complaints at this time  Objective: Vitals:   01/26/18 0600 01/26/18 1411 01/26/18 2115 01/27/18 0540  BP:  118/63 (!) 112/56 121/65  Pulse:  64 (!) 59 62  Resp:  18    Temp:  (!) 97.5 F (36.4 C) 98.9 F (37.2 C) 98.4 F (36.9 C)  TempSrc:  Oral Oral Oral  SpO2:  100% 97% 96%  Weight: 106.5 kg     Height:          Intake/Output Summary (Last 24 hours) at 01/27/2018 0810 Last data filed at 01/27/2018 0548 Gross per 24 hour  Intake 2400 ml  Output 1250 ml  Net 1150 ml   Filed Weights   01/26/18 0600  Weight: 106.5 kg   Examination: Physical Exam:  Constitutional: WN/WD obese Caucasian male in NAD and appears calm and comfortable eating Breakfast Eyes: Lids and conjunctivae normal, sclerae anicteric  ENMT: External Ears, Nose appear normal. Grossly normal hearing.  Neck: Appears normal, supple, no cervical masses, normal ROM, no appreciable thyromegaly, no JVD Respiratory: Diminished to auscultation bilaterally, no wheezing, rales, rhonchi or crackles. Normal respiratory effort and patient is not tachypenic. No accessory muscle use.  Cardiovascular: RRR, Slight systolic murmur appreciated. S1 and S2 auscultated. Trace LE edema Abdomen: Soft, non-tender, non-distended.  Bowel sounds positive x4.  GU: Deferred. Musculoskeletal: No clubbing / cyanosis of digits/nails. Normal strength and muscle tone.  Skin: No rashes, lesions, ulcers on a limited skin evaluation. No induration; Warm and dry.  Neurologic: CN 2-12 grossly intact with no focal deficits. Romberg sign and cerebellar reflexes not assessed.  Psychiatric: Normal judgment and insight. Alert and oriented x 3. Normal mood and appropriate affect.   Data Reviewed: I have personally reviewed following labs and imaging studies  CBC: Recent Labs  Lab 01/23/18 0418 01/24/18 0337 01/25/18 0508 01/26/18 0519 01/27/18 0434  WBC 8.5 7.2 8.7 6.2 5.5  HGB 8.9* 8.7* 9.3* 8.2* 8.7*  HCT 26.5* 25.6* 27.1* 25.8* 26.9*  MCV 84.0 83.7 85.0 87.5 87.1  PLT 129* 115* 127* 127* 950*   Basic Metabolic Panel: Recent Labs  Lab 01/23/18 0418 01/24/18 0337 01/25/18 0508 01/26/18 0519 01/27/18 0434  NA 138 139 139 138 138  K 4.1 4.0 4.2 3.8 3.5  CL 112* 114* 110 110 109  CO2 18* 19* 20* 21* 21*  GLUCOSE 205* 133* 155* 147* 117*  BUN 30* 29* 28*  25* 20  CREATININE 1.53* 1.47* 1.60* 1.58* 1.48*  CALCIUM 8.1* 7.9* 8.2* 8.2* 8.6*   GFR: Estimated Creatinine Clearance: 47.7 mL/min (A) (by C-G formula based on SCr of 1.48 mg/dL (H)). Liver Function Tests: Recent Labs  Lab 01/25/18 0508  AST 37  ALT 42  ALKPHOS 46  BILITOT 1.2  PROT 6.0*  ALBUMIN 2.9*   Recent Labs  Lab 01/25/18 0508  LIPASE 47   No results for input(s): AMMONIA in the last 168 hours. Coagulation Profile: Recent Labs  Lab 01/23/18 0418 01/24/18 0337 01/25/18 0508 01/26/18 0519 01/27/18 0434  INR 1.43 1.56 1.74 1.86 1.41   Cardiac Enzymes: No results for input(s): CKTOTAL, CKMB, CKMBINDEX, TROPONINI in the last 168 hours. BNP (last 3 results) No results for input(s): PROBNP in the last 8760 hours. HbA1C: No results for input(s): HGBA1C in the last 72 hours. CBG: Recent Labs  Lab 01/26/18 0826 01/26/18 1218 01/26/18 1723 01/26/18 2123 01/27/18 0746  GLUCAP 126* 146* 141* 135* 108*   Lipid Profile: No results for input(s): CHOL, HDL, LDLCALC, TRIG, CHOLHDL, LDLDIRECT in the last 72 hours. Thyroid Function Tests: No results for input(s): TSH, T4TOTAL, FREET4, T3FREE, THYROIDAB in the last 72 hours. Anemia Panel: Recent Labs    01/26/18 0519  TIBC 211*  IRON 17*   Sepsis Labs: Recent Labs  Lab 01/24/18 0824 01/25/18 0508  PROCALCITON 0.21 0.17  LATICACIDVEN 1.3  --     No results found for this or any previous visit (from the past 240 hour(s)).   Radiology Studies: Dg Chest 2 View  Result Date: 01/25/2018 CLINICAL DATA:  Congestive heart failure EXAM: CHEST - 2 VIEW COMPARISON:  CT 01/23/2018 FINDINGS: Cardiomegaly with moderate aortic atherosclerosis. Left-sided ICD device projects over the mid left hemithorax. Leads in the right atrium, right ventricle and coronary sinus are noted. Small bilateral pleural effusions blunting the costophrenic angles. No overt pulmonary edema. Mild interstitial change of the lungs. IMPRESSION:  Cardiomegaly with aortic atherosclerosis. Small bilateral pleural effusions. No active pulmonary disease. Electronically Signed   By: Ashley Royalty M.D.   On: 01/25/2018 22:53   Scheduled Meds: . carvedilol  12.5 mg Oral BID WC  . cholecalciferol  2,000 Units Oral Daily  . donepezil  10 mg Oral QHS  . ezetimibe  10 mg Oral QHS  . furosemide  40 mg Intravenous BID  . insulin aspart  0-5 Units Subcutaneous QHS  . insulin aspart  0-9 Units Subcutaneous TID WC  . pantoprazole  40 mg Oral BID  . polyethylene glycol  17 g Oral Daily  . rosuvastatin  10 mg Oral Daily  . sodium chloride flush  3 mL Intravenous Q12H   Continuous Infusions: . sodium chloride      LOS: 2 days   Kerney Elbe, DO [facility] PAGER is on AMION  If 7PM-7AM,  please contact night-coverage [email or web address] Password [email or web address] 01/27/2018, 8:10 AM

## 2018-01-28 DIAGNOSIS — E876 Hypokalemia: Secondary | ICD-10-CM

## 2018-01-28 LAB — CBC WITH DIFFERENTIAL/PLATELET
BASOS PCT: 0 %
Band Neutrophils: 0 %
Basophils Absolute: 0 10*3/uL (ref 0.0–0.1)
Blasts: 0 %
EOS PCT: 4 %
Eosinophils Absolute: 0.2 10*3/uL (ref 0.0–0.7)
HCT: 28.2 % — ABNORMAL LOW (ref 39.0–52.0)
Hemoglobin: 8.6 g/dL — ABNORMAL LOW (ref 13.0–17.0)
LYMPHS ABS: 1.2 10*3/uL (ref 0.7–4.0)
LYMPHS PCT: 20 %
MCH: 27.4 pg (ref 26.0–34.0)
MCHC: 30.5 g/dL (ref 30.0–36.0)
MCV: 89.8 fL (ref 78.0–100.0)
MONO ABS: 1.3 10*3/uL — AB (ref 0.1–1.0)
Metamyelocytes Relative: 0 %
Monocytes Relative: 23 %
Myelocytes: 0 %
NEUTROS PCT: 53 %
NRBC: 0 /100{WBCs}
Neutro Abs: 3.1 10*3/uL (ref 1.7–7.7)
OTHER: 0 %
Platelets: 155 10*3/uL (ref 150–400)
Promyelocytes Relative: 0 %
RBC: 3.14 MIL/uL — ABNORMAL LOW (ref 4.22–5.81)
RDW: 17.1 % — AB (ref 11.5–15.5)
WBC: 5.8 10*3/uL (ref 4.0–10.5)

## 2018-01-28 LAB — COMPREHENSIVE METABOLIC PANEL
ALBUMIN: 2.7 g/dL — AB (ref 3.5–5.0)
ALK PHOS: 57 U/L (ref 38–126)
ALT: 19 U/L (ref 0–44)
AST: 12 U/L — ABNORMAL LOW (ref 15–41)
Anion gap: 7 (ref 5–15)
BUN: 15 mg/dL (ref 8–23)
CALCIUM: 8.7 mg/dL — AB (ref 8.9–10.3)
CO2: 26 mmol/L (ref 22–32)
CREATININE: 1.4 mg/dL — AB (ref 0.61–1.24)
Chloride: 107 mmol/L (ref 98–111)
GFR, EST AFRICAN AMERICAN: 52 mL/min — AB (ref >60)
GFR, EST NON AFRICAN AMERICAN: 45 mL/min — AB (ref >60)
Glucose, Bld: 120 mg/dL — ABNORMAL HIGH (ref 70–99)
Potassium: 3.3 mmol/L — ABNORMAL LOW (ref 3.5–5.1)
SODIUM: 140 mmol/L (ref 135–145)
TOTAL PROTEIN: 5.6 g/dL — AB (ref 6.5–8.1)
Total Bilirubin: 1.2 mg/dL (ref 0.3–1.2)

## 2018-01-28 LAB — TYPE AND SCREEN
ABO/RH(D): O NEG
Antibody Screen: NEGATIVE

## 2018-01-28 LAB — GLUCOSE, CAPILLARY
GLUCOSE-CAPILLARY: 190 mg/dL — AB (ref 70–99)
Glucose-Capillary: 123 mg/dL — ABNORMAL HIGH (ref 70–99)
Glucose-Capillary: 149 mg/dL — ABNORMAL HIGH (ref 70–99)
Glucose-Capillary: 167 mg/dL — ABNORMAL HIGH (ref 70–99)

## 2018-01-28 LAB — ABO/RH: ABO/RH(D): O NEG

## 2018-01-28 LAB — PHOSPHORUS: PHOSPHORUS: 3.7 mg/dL (ref 2.5–4.6)

## 2018-01-28 LAB — MAGNESIUM: MAGNESIUM: 1.5 mg/dL — AB (ref 1.7–2.4)

## 2018-01-28 MED ORDER — MAGNESIUM SULFATE 2 GM/50ML IV SOLN
2.0000 g | Freq: Once | INTRAVENOUS | Status: AC
  Administered 2018-01-28: 2 g via INTRAVENOUS
  Filled 2018-01-28: qty 50

## 2018-01-28 MED ORDER — POTASSIUM CHLORIDE CRYS ER 20 MEQ PO TBCR
40.0000 meq | EXTENDED_RELEASE_TABLET | Freq: Two times a day (BID) | ORAL | Status: AC
  Administered 2018-01-28 (×2): 40 meq via ORAL
  Filled 2018-01-28 (×2): qty 2

## 2018-01-28 NOTE — Progress Notes (Signed)
Pt continues to c/o mask leaking throughout the night. Mask downsized from Medium to Small. Pt states he is more comfortable, and feels like there is less of a leak. Advised pt to have RN notify RT if any other issues with Mask/machine arise. RT will continue to monitor

## 2018-01-28 NOTE — Progress Notes (Signed)
Subjective No acute events. Denies n/v. Denies abdominal pain. Passing flatus; had 1 BM yesterday. Denies any complaints this morning.  Objective: Vital signs in last 24 hours: Temp:  [97.8 F (36.6 C)-98.9 F (37.2 C)] 98.7 F (37.1 C) (09/01 0622) Pulse Rate:  [59-63] 59 (09/01 0622) Resp:  [16] 16 (09/01 0622) BP: (116-124)/(45-67) 120/51 (09/01 0622) SpO2:  [96 %-99 %] 96 % (09/01 0622) Weight:  [098 kg] 107 kg (09/01 0622) Last BM Date: 01/27/18  Intake/Output from previous day: 08/31 0701 - 09/01 0700 In: 710 [P.O.:710] Out: 2775 [Urine:2775] Intake/Output this shift: Total I/O In: -  Out: 250 [Urine:250]  Gen: NAD, comfortable CV: RRR Pulm: Normal work of breathing Abd: Obese, soft, NT. Ext: SCDs in place  Lab Results: CBC  Recent Labs    01/27/18 0434 01/28/18 0600  WBC 5.5 5.8  HGB 8.7* 8.6*  HCT 26.9* 28.2*  PLT 127* 155   BMET Recent Labs    01/27/18 0434 01/28/18 0600  NA 138 140  K 3.5 3.3*  CL 109 107  CO2 21* 26  GLUCOSE 117* 120*  BUN 20 15  CREATININE 1.48* 1.40*  CALCIUM 8.6* 8.7*   PT/INR Recent Labs    01/26/18 0519 01/27/18 0434  LABPROT 21.3* 17.1*  INR 1.86 1.41   ABG No results for input(s): PHART, HCO3 in the last 72 hours.  Invalid input(s): PCO2, PO2  Studies/Results:  Anti-infectives: Anti-infectives (From admission, onward)   None       Assessment/Plan: Patient Active Problem List   Diagnosis Date Noted  . Adenocarcinoma of colon ([place]) 01/25/2018  . Ischemic cardiomyopathy 01/25/2018  . Acute on chronic systolic CHF (congestive heart failure) ([place]) 01/25/2018  . Biventricular implantable cardioverter-defibrillator in situ 01/25/2018  . Atrial fibrillation, chronic ([place]) 01/25/2018  . Gastrointestinal bleeding, lower 01/25/2018  . Acute posthemorrhagic anemia   . Benign neoplasm of transverse colon   . Neoplasm of digestive system   . Melena   . Gastritis without bleeding   . GI bleed 01/21/2018   . Iron deficiency anemia 10/02/2017   Luis Palmer is a pleasant 72yoM with hx HTN, DM, Afib, chronic systolic CHF (last EF ~11%), prior CABG, acute on chronic anemia likely 2/2 colon cancer - here with partially obstructing ascending colon CA  -INR reversed. Cardiology and [facility] following for assistance in his care. High cardiac risk but cleared as no further interventions were deemed necessary for additional optimization -CT C/A/P - mass appeared more in asc colon vs transverse as indicated prior on endoscopy. No metastatic dz; CEA 8.6 -Gentle bowel prep underway; clears during this time; possible OR 9/3   LOS: 3 days   Luis Palmer, M.D. [facility]

## 2018-01-28 NOTE — Progress Notes (Signed)
PROGRESS NOTE    Luis Palmer  SEG:[id number] DOB: 17-Nov-1932 DOA: 01/25/2018 PCP: Idelle Crouch, MD   Brief Narrative:  The patient is an 84 year old male with history of severe ischemic cardiomyopathy status post ICD, A. fib post ablation x2, CAD with history of CABG, chronic systolic CHF and chronic A. fib on anticoagulation was admitted to [facility] on 8/25 with acute onset of bright red blood per rectum.  He was given Kcentra to reverese anticoagulation and transfused PRBCs.  Patient underwent colonoscopy showing colon mass in the ascending colon that was biopsied showing adenocarcinoma of the colon.    He was seen by General Surgery who recommended resection.  Cardiology evaluated who cleared for surgery.  CT of the chest showed findings of CHF which seemed stable.  Patient and family wanted to be transferred to [facility] for surgery given concern for extensive heart disease.  Patient admitted to hospitalist service and General Surgery and Cardiology evaluated at [facility]. He is high risk for surgery but cleared and anticipated surgery to happen on 01/30/18.   Assessment & Plan:   Active Problems:   Iron deficiency anemia   GI bleed   Acute posthemorrhagic anemia   Neoplasm of digestive system   Adenocarcinoma of colon (HCC)   Ischemic cardiomyopathy   Acute on chronic systolic CHF (congestive heart failure) (HCC)   Biventricular implantable cardioverter-defibrillator in situ   Atrial fibrillation, chronic (HCC)   Gastrointestinal bleeding, lower  Lower GI bleed with adenocarcinoma of the colon -No further active bleeding.  -Coumadin on hold and was reversed at [facility].  -Hemoglobin stable (received PRBC transfusion at [facility]) and now is 8.6/28.2 today  -Monitor H&H closely.  Type and screen.  INR 1.41 today ( received vitamin k yesterday -Seen by surgery with plan on OR early next week (9/3) as patient needs to get bowel prepped  as well and gentle prep started per general surgery and will continue MiraLAX 17 g p.o. daily.  -Cardiology Evaluated for Pre-Op Clearance but pateint is high risk -Continue clears for now -Appreciate Surgical and Cardiology evaluation and recommendations.  Acute Blood Loss Anemia -Secondary to rectal bleed with adenocarcinoma. -Patient admits to having dark tarry stools but did not "pay any attention to it" and then had Bright Red Blood.  -Hemoglobin stable at 8.7 today.  Has low iron stores as Anemia Panel showed iron level of 17, U IBC 194, TIBC of 211, and saturation ratios of 8%.   -Transfuse as needed and if hemoglobin drops below 7 -EGD showed gastritis.   -As Above -Hb/HCt now 8.6/28.2  Ischemic cardiomyopathy -EF of 20%   Has V pacemaker.   -No anginal symptoms or an acute CHF.  Dyspnea on exertion likely due to symptomatic anemia. -Cardiology consult appreciated.  Cleared for general anesthesia but recommend patient to be high risk given significant cardiac issues.   -Recommend to continue beta-blocker, hold ARB until after surgery.  Continue twice daily Lasix.   -Pacemaker interrogation per Cards  -BNP on admission was 1290.3 -Monitor strict I/O and daily weight. Patient is -2.410 Liters and Weight is up 1 Lb   Chronic A. fib -Continue beta-blocker with carvedilol 12.5 mill grams p.o. twice daily.   -Coumadin held and INR reversed and is now 1.41 -Rate controlled on telemetry. -Cardiology following appreciate further recommendations evaluation; Defer to Cardiology to start Heparin gtt if they feel necessary   Chronic kidney disease stage III -Renal function seems at baseline.   -  Pain went from 28/1.60 and is now 15/1.40 -Stable -Avoid nephrotoxic medications possible -Continue with home Lasix 40 mg p.o. twice daily as above per cardiology -We will hold ARB per cardiology recommendations  Essential hypertension -Blood pressure stable.   -Holding ARB.  -Continue  Beta-Blocker as above.  Mild dementia -Continue Donepezil 10 mg p.o. nightly  CAD with history of CABG -No chest pain symptoms.  Continue beta-blocker and statin with rosuvastatin 10 g p.o. nightly. -Holding ARB as mentioned above and is not on any aspirin at this time due to bleeding  Diabetes mellitus type 2, controlled -Not on home meds.  Monitor with sliding scale coverage. -CBG's ranging from 123-174  Hyperlipidemia -Continue with Zetia 10 mg p.o. nightly and Rosuvastatin p.o. daily  Hypomagnesemia -Patient magnesium was 1.5 -Replete with IV mag sulfate 2 g -Continue monitor replete as necessary -Repeat magnesium level in a.m.  Hypokalemia -Patient potassium was 3.3 -Replete with p.o. potassium chloride 40 mg p.o. twice daily x2 doses -Continue monitor and replete as necessary -Repeat CMP in a.m.  DVT prophylaxis: SCDs as Anticoagulation with Coumadin being held Code Status: FULL CODE Family Communication: Discussed plan of care with wife at bedside Disposition Plan: Remain inpatient for surgery scheduled next week  Consultants:   Cardiology   General Surgery    Procedures:  ECHOCARDIOGRAM 01/24/18 ------------------------------------------------------------------- Study Conclusions  - Left ventricle: The cavity size was severely dilated. Systolic   function was severely reduced. The estimated ejection fraction   was 20%. Diffuse hypokinesis. - Aortic valve: Valve area (Vmax): 2.16 cm^2. - Mitral valve: There was mild to moderate regurgitation. - Left atrium: The atrium was mildly dilated. - Right ventricle: The cavity size was mildly dilated. - Right atrium: The atrium was mildly dilated. - Tricuspid valve: There was mild-moderate regurgitation.   Antimicrobials:  Anti-infectives (From admission, onward)   None     Subjective: Seen and examined at bedside and had no complaints and states that he slept okay however had trouble with his CPAP mask  last night.  No lightheadedness or dizziness.  No chest pain or shortness breath.  Denies any leg swelling.  States he had a bowel movement yesterday and it was normal brown.  No other concerns or complaints at this time and continues to be on a clear liquid diet.  Objective: Vitals:   01/27/18 1321 01/27/18 2146 01/28/18 0622 01/28/18 1323  BP: 124/67 (!) 116/45 (!) 120/51 119/60  Pulse: 63 61 (!) 59 60  Resp: 16 16 16 16   Temp: 97.8 F (36.6 C) 98.9 F (37.2 C) 98.7 F (37.1 C) 97.9 F (36.6 C)  TempSrc: Oral Oral Oral Oral  SpO2: 99% 96% 96% 97%  Weight:   107 kg   Height:        Intake/Output Summary (Last 24 hours) at 01/28/2018 1543 Last data filed at 01/28/2018 1449 Gross per 24 hour  Intake 1090 ml  Output 4075 ml  Net -2985 ml   Filed Weights   01/26/18 0600 01/27/18 0924 01/28/18 0622  Weight: 106.5 kg 108.1 kg 107 kg   Examination: Physical Exam:  Constitutional: Well-nourished, well-developed obese Caucasian male who is currently no acute distress, comfortable sitting up in bed Eyes: Sclera anicteric.  Lids and conjunctive are normal. ENMT: External ears and nose appear normal.  Grossly normal hearing.  Mucous members appear moist Neck: Appears supple with no JVD Respiratory: Diminished to auscultation bilaterally no appreciable wheezing, rales, rhonchi.  Patient was not tachypneic wheezing excess muscle breathe  Cardiovascular: Regular rate and rhythm. Has a slight systolic murmur appreciated.  S1-S2 auscultated.  Has mild lower extremity edema Abdomen: Soft, nontender, nondistended.  Bowel sounds present all 4 quadrants GU: Deferred Musculoskeletal: No clubbing or cyanosis.  No contractures Skin: Skin is warm and dry no appreciable rashes or lesions limited skin evaluation Neurologic: Cranial nerves II through XII grossly intact no appreciable focal deficits.  Romberg sign and cerebellar reflexes were not assessed Psychiatric: Normal mood and affect.  Intact  judgment insight.  Patient is awake, alert and oriented x3.  Data Reviewed: I have personally reviewed following labs and imaging studies  CBC: Recent Labs  Lab 01/24/18 0337 01/25/18 0508 01/26/18 0519 01/27/18 0434 01/28/18 0600  WBC 7.2 8.7 6.2 5.5 5.8  NEUTROABS  --   --   --   --  3.1  HGB 8.7* 9.3* 8.2* 8.7* 8.6*  HCT 25.6* 27.1* 25.8* 26.9* 28.2*  MCV 83.7 85.0 87.5 87.1 89.8  PLT 115* 127* 127* 127* 009   Basic Metabolic Panel: Recent Labs  Lab 01/24/18 0337 01/25/18 0508 01/26/18 0519 01/27/18 0434 01/28/18 0600  NA 139 139 138 138 140  K 4.0 4.2 3.8 3.5 3.3*  CL 114* 110 110 109 107  CO2 19* 20* 21* 21* 26  GLUCOSE 133* 155* 147* 117* 120*  BUN 29* 28* 25* 20 15  CREATININE 1.47* 1.60* 1.58* 1.48* 1.40*  CALCIUM 7.9* 8.2* 8.2* 8.6* 8.7*  MG  --   --   --   --  1.5*  PHOS  --   --   --   --  3.7   GFR: Estimated Creatinine Clearance: 50.6 mL/min (A) (by C-G formula based on SCr of 1.4 mg/dL (H)). Liver Function Tests: Recent Labs  Lab 01/25/18 0508 01/28/18 0600  AST 37 12*  ALT 42 19  ALKPHOS 46 57  BILITOT 1.2 1.2  PROT 6.0* 5.6*  ALBUMIN 2.9* 2.7*   Recent Labs  Lab 01/25/18 0508  LIPASE 47   No results for input(s): AMMONIA in the last 168 hours. Coagulation Profile: Recent Labs  Lab 01/23/18 0418 01/24/18 0337 01/25/18 0508 01/26/18 0519 01/27/18 0434  INR 1.43 1.56 1.74 1.86 1.41   Cardiac Enzymes: No results for input(s): CKTOTAL, CKMB, CKMBINDEX, TROPONINI in the last 168 hours. BNP (last 3 results) No results for input(s): PROBNP in the last 8760 hours. HbA1C: No results for input(s): HGBA1C in the last 72 hours. CBG: Recent Labs  Lab 01/27/18 1139 01/27/18 1741 01/27/18 2143 01/28/18 0742 01/28/18 1203  GLUCAP 172* 137* 174* 123* 149*   Lipid Profile: No results for input(s): CHOL, HDL, LDLCALC, TRIG, CHOLHDL, LDLDIRECT in the last 72 hours. Thyroid Function Tests: No results for input(s): TSH, T4TOTAL, FREET4,  T3FREE, THYROIDAB in the last 72 hours. Anemia Panel: Recent Labs    01/26/18 0519  TIBC 211*  IRON 17*   Sepsis Labs: Recent Labs  Lab 01/24/18 0824 01/25/18 0508  PROCALCITON 0.21 0.17  LATICACIDVEN 1.3  --     No results found for this or any previous visit (from the past 240 hour(s)).   Radiology Studies: No results found. Scheduled Meds: . carvedilol  12.5 mg Oral BID WC  . cholecalciferol  2,000 Units Oral Daily  . donepezil  10 mg Oral QHS  . ezetimibe  10 mg Oral QHS  . furosemide  40 mg Intravenous BID  . insulin aspart  0-5 Units Subcutaneous QHS  . insulin aspart  0-9 Units Subcutaneous TID WC  .  pantoprazole  40 mg Oral BID  . polyethylene glycol  17 g Oral Daily  . potassium chloride  40 mEq Oral BID  . rosuvastatin  10 mg Oral Daily  . sodium chloride flush  3 mL Intravenous Q12H   Continuous Infusions: . sodium chloride      LOS: 3 days   Kerney Elbe, DO [facility] PAGER is on AMION  If 7PM-7AM, please contact night-coverage [email or web address] Password [facility] 01/28/2018, 3:43 PM

## 2018-01-28 NOTE — Progress Notes (Signed)
Telemetry called for update. Checked pt in room. Pt asleep.

## 2018-01-29 ENCOUNTER — Encounter (HOSPITAL_COMMUNITY): Payer: Self-pay | Admitting: General Practice

## 2018-01-29 DIAGNOSIS — I482 Chronic atrial fibrillation: Secondary | ICD-10-CM

## 2018-01-29 LAB — COMPREHENSIVE METABOLIC PANEL
ALT: 16 U/L (ref 0–44)
ANION GAP: 8 (ref 5–15)
AST: 11 U/L — ABNORMAL LOW (ref 15–41)
Albumin: 2.6 g/dL — ABNORMAL LOW (ref 3.5–5.0)
Alkaline Phosphatase: 57 U/L (ref 38–126)
BUN: 13 mg/dL (ref 8–23)
CO2: 28 mmol/L (ref 22–32)
Calcium: 8.8 mg/dL — ABNORMAL LOW (ref 8.9–10.3)
Chloride: 105 mmol/L (ref 98–111)
Creatinine, Ser: 1.43 mg/dL — ABNORMAL HIGH (ref 0.61–1.24)
GFR calc Af Amer: 51 mL/min — ABNORMAL LOW (ref >60)
GFR calc non Af Amer: 44 mL/min — ABNORMAL LOW (ref >60)
GLUCOSE: 120 mg/dL — AB (ref 70–99)
POTASSIUM: 3.6 mmol/L (ref 3.5–5.1)
Sodium: 141 mmol/L (ref 135–145)
Total Bilirubin: 1.2 mg/dL (ref 0.3–1.2)
Total Protein: 5.9 g/dL — ABNORMAL LOW (ref 6.5–8.1)

## 2018-01-29 LAB — GLUCOSE, CAPILLARY
GLUCOSE-CAPILLARY: 166 mg/dL — AB (ref 70–99)
Glucose-Capillary: 140 mg/dL — ABNORMAL HIGH (ref 70–99)
Glucose-Capillary: 155 mg/dL — ABNORMAL HIGH (ref 70–99)
Glucose-Capillary: 174 mg/dL — ABNORMAL HIGH (ref 70–99)

## 2018-01-29 LAB — CBC WITH DIFFERENTIAL/PLATELET
Abs Immature Granulocytes: 0 10*3/uL (ref 0.0–0.1)
Basophils Absolute: 0 10*3/uL (ref 0.0–0.1)
Basophils Relative: 0 %
EOS ABS: 0.2 10*3/uL (ref 0.0–0.7)
Eosinophils Relative: 3 %
HEMATOCRIT: 27 % — AB (ref 39.0–52.0)
Hemoglobin: 8.5 g/dL — ABNORMAL LOW (ref 13.0–17.0)
Immature Granulocytes: 0 %
LYMPHS ABS: 1.3 10*3/uL (ref 0.7–4.0)
Lymphocytes Relative: 23 %
MCH: 27.2 pg (ref 26.0–34.0)
MCHC: 31.5 g/dL (ref 30.0–36.0)
MCV: 86.3 fL (ref 78.0–100.0)
MONOS PCT: 22 %
Monocytes Absolute: 1.2 10*3/uL — ABNORMAL HIGH (ref 0.1–1.0)
Neutro Abs: 2.8 10*3/uL (ref 1.7–7.7)
Neutrophils Relative %: 50 %
Platelets: 156 10*3/uL (ref 150–400)
RBC: 3.13 MIL/uL — ABNORMAL LOW (ref 4.22–5.81)
RDW: 16.9 % — AB (ref 11.5–15.5)
WBC: 5.5 10*3/uL (ref 4.0–10.5)

## 2018-01-29 LAB — MAGNESIUM: Magnesium: 1.7 mg/dL (ref 1.7–2.4)

## 2018-01-29 LAB — PHOSPHORUS: Phosphorus: 3.3 mg/dL (ref 2.5–4.6)

## 2018-01-29 MED ORDER — POTASSIUM CHLORIDE CRYS ER 20 MEQ PO TBCR
40.0000 meq | EXTENDED_RELEASE_TABLET | Freq: Two times a day (BID) | ORAL | Status: AC
  Administered 2018-01-29 (×2): 40 meq via ORAL
  Filled 2018-01-29 (×2): qty 2

## 2018-01-29 MED ORDER — MAGNESIUM SULFATE 2 GM/50ML IV SOLN
2.0000 g | Freq: Once | INTRAVENOUS | Status: AC
  Administered 2018-01-29: 2 g via INTRAVENOUS
  Filled 2018-01-29: qty 50

## 2018-01-29 NOTE — Progress Notes (Signed)
PROGRESS NOTE    Luis Palmer  BSJ:[id number] DOB: 10-20-1932 DOA: 01/25/2018 PCP: Idelle Crouch, MD   Brief Narrative:  The patient is an 84 year old male with history of severe ischemic cardiomyopathy status post ICD, A. fib post ablation x2, CAD with history of CABG, chronic systolic CHF and chronic A. fib on anticoagulation was admitted to [facility] regional hospital on 8/25 with acute onset of bright red blood per rectum.  He was given Kcentra to reverese anticoagulation and transfused PRBCs.  Patient underwent colonoscopy showing colon mass in the ascending colon that was biopsied showing adenocarcinoma of the colon.    He was seen by General Surgery who recommended resection.  Cardiology evaluated who cleared for surgery.  CT of the chest showed findings of CHF which seemed stable.  Patient and family wanted to be transferred to [facility] for surgery given concern for extensive heart disease.  Patient admitted to hospitalist service and General Surgery and Cardiology evaluated at [facility]. He is high risk for surgery but cleared and anticipated surgery to happen on 01/30/18.   Assessment & Plan:   Active Problems:   Iron deficiency anemia   GI bleed   Acute posthemorrhagic anemia   Neoplasm of digestive system   Adenocarcinoma of colon (HCC)   Ischemic cardiomyopathy   Acute on chronic systolic CHF (congestive heart failure) (HCC)   Biventricular implantable cardioverter-defibrillator in situ   Atrial fibrillation, chronic (HCC)   Gastrointestinal bleeding, lower  Lower GI bleed with adenocarcinoma of the colon -No further active bleeding.  -Coumadin on hold and was reversed at [place] regional.  -Hemoglobin stable (received PRBC transfusion at [facility]) and now is 8.5/27.0 today  -Monitor H&H closely. Type and screen.  INR 1.41 on 8/31  (Received Vitamin K during this Admission) -Seen by surgery with plan on OR early next week (9/3) as patient needs to  get bowel prepped as well and gentle prep started per general surgery and will continue MiraLAX 17 g p.o. daily.  -Cardiology Evaluated for Pre-Op Clearance but pateint is high risk -CEA was 8.6 -Continue clears for now and NPO after Midnight  -Appreciate Surgical and Cardiology evaluation and recommendations.  Acute Blood Loss Anemia -Secondary to rectal bleed with adenocarcinoma. -Patient admits to having dark tarry stools but did not "pay any attention to it" and then had Bright Red Blood.  -Hemoglobin stable at 8.7 today.  Has low iron stores as Anemia Panel showed iron level of 17, U IBC 194, TIBC of 211, and saturation ratios of 8%.   -Transfuse as needed and if hemoglobin drops below 7 -EGD showed gastritis.   -As Above -Hb/HCt now 8.5/27.0  Ischemic Cardiomyopathy -EF of 20%   Has V pacemaker.   -No anginal symptoms or an acute CHF.  Dyspnea on exertion likely due to symptomatic anemia. -Cardiology consult appreciated.  Cleared for general anesthesia but recommend patient to be high risk given significant cardiac issues.   -Recommend to continue beta-blocker, hold ARB until after surgery.  Continue twice daily Lasix.   -Pacemaker interrogation per Cards  -BNP on admission was 1290.3 -Monitor strict I/O and daily weight. Patient is -3.440 Liters and Weight is down 2 Lb   Chronic A. fib -Continue beta-blocker with carvedilol 12.5 mg p.o. twice daily.   -Coumadin held and INR reversed and is now 1.41 -Rate controlled on telemetry. -Cardiology following appreciate further recommendations evaluation; Defer to Cardiology to start Heparin gtt if they feel necessary   Chronic kidney  disease stage III -Renal function seems at baseline.   -Pain went from 28/1.60 and is now 13/1.43 -Stable -Avoid nephrotoxic medications possible -Continue with home Lasix 40 mg p.o. twice daily as above per cardiology -We will hold ARB per cardiology recommendations  Essential  hypertension -Blood pressure stable.   -Holding ARB.  -Continue Beta-Blocker as above.  Mild dementia -Continue Donepezil 10 mg p.o. nightly  CAD with history of CABG -No chest pain symptoms.  Continue beta-blocker and statin with rosuvastatin 10 g p.o. nightly. -Holding ARB as mentioned above and is not on any aspirin at this time due to bleeding  Diabetes mellitus type 2, controlled -Not on home meds.  Monitor with sliding scale coverage. -CBG's ranging from 140-190  Hyperlipidemia -Continue with Zetia 10 mg p.o. nightly and Rosuvastatin p.o. daily  Hypomagnesemia, improved -Patient magnesium was 1.5 and improved 1.7 -Replete with IV mag sulfate 2 g again today to get close to 2.0 -Continue monitor replete as necessary -Repeat magnesium level in a.m.  Hypokalemia, improved -Patient potassium was 3.3 and improved to 3.6 -In the setting of po Lasix BID -Replete with p.o. potassium chloride 40 mg p.o. twice daily x2 doses again -Continue monitor and replete as necessary -Repeat CMP in a.m.  DVT prophylaxis: SCDs as Anticoagulation with Coumadin being held Code Status: FULL CODE Family Communication: Discussed plan of care with wife at bedside Disposition Plan: Remain inpatient for surgery scheduled for tomorrow   Consultants:   Cardiology   General Surgery    Procedures:  ECHOCARDIOGRAM 01/24/18 ------------------------------------------------------------------- Study Conclusions  - Left ventricle: The cavity size was severely dilated. Systolic   function was severely reduced. The estimated ejection fraction   was 20%. Diffuse hypokinesis. - Aortic valve: Valve area (Vmax): 2.16 cm^2. - Mitral valve: There was mild to moderate regurgitation. - Left atrium: The atrium was mildly dilated. - Right ventricle: The cavity size was mildly dilated. - Right atrium: The atrium was mildly dilated. - Tricuspid valve: There was mild-moderate  regurgitation.   Antimicrobials:  Anti-infectives (From admission, onward)   None     Subjective: Seen and examined at bedside no issues.  No chest pain, shortness breath, lightheadedness or dizziness.  No other concerns or complaints at this time and will just questioning what time the surgery will take place.  No other complaints or concerns and sleptwell.  Objective: Vitals:   01/28/18 1323 01/28/18 2051 01/28/18 2227 01/29/18 0500  BP: 119/60 (!) 110/50  (!) 101/45  Pulse: 60 (!) 59 60 60  Resp: 16 17 18 18   Temp: 97.9 F (36.6 C) 98.3 F (36.8 C)  98.3 F (36.8 C)  TempSrc: Oral Oral  Oral  SpO2: 97% 97% 96% 98%  Weight:    105.3 kg  Height:        Intake/Output Summary (Last 24 hours) at 01/29/2018 1435 Last data filed at 01/29/2018 1150 Gross per 24 hour  Intake 800 ml  Output 1920 ml  Net -1120 ml   Filed Weights   01/27/18 0924 01/28/18 0622 01/29/18 0500  Weight: 108.1 kg 107 kg 105.3 kg   Examination: Physical Exam:  Constitutional: Well-nourished, well-developed obese Caucasian male is currently in no acute distress and is sitting up in the chair at bedside Eyes: Sclera are anicteric.  Lids and conjunctive are normal ENMT: External ears and nose appear normal.  Grossly members appear moist Neck: Appears supple with no JVD Respiratory: Diminished to auscultation bilaterally no appreciable wheezing, rales, rhonchi.  Patient not tachypneic  using accessory muscles to breathe Cardiovascular: Regular rate and rhythm.  Has a systolic murmur that is slightly appreciated.  Very minimal lower extremity edema Abdomen: Soft, nontender, nondistended bowel sounds present all 4 quadrants GU: Deferred Musculoskeletal: No contractures or cyanosis.  No joint deformities noted Skin: Skin is warm and dry no appreciable rashes or lesions limited skin evaluation.  Has chronic lower extremity venous stasis changes and trace swelling Neurologic: Cranial nerves II through XII  grossly intact no appreciable focal deficits. Psychiatric: Normal mood and affect.  Intact judgment insight.  Patient is awake and alert and oriented x3  Data Reviewed: I have personally reviewed following labs and imaging studies  CBC: Recent Labs  Lab 01/25/18 0508 01/26/18 0519 01/27/18 0434 01/28/18 0600 01/29/18 0444  WBC 8.7 6.2 5.5 5.8 5.5  NEUTROABS  --   --   --  3.1 2.8  HGB 9.3* 8.2* 8.7* 8.6* 8.5*  HCT 27.1* 25.8* 26.9* 28.2* 27.0*  MCV 85.0 87.5 87.1 89.8 86.3  PLT 127* 127* 127* 155 193   Basic Metabolic Panel: Recent Labs  Lab 01/25/18 0508 01/26/18 0519 01/27/18 0434 01/28/18 0600 01/29/18 0444  NA 139 138 138 140 141  K 4.2 3.8 3.5 3.3* 3.6  CL 110 110 109 107 105  CO2 20* 21* 21* 26 28  GLUCOSE 155* 147* 117* 120* 120*  BUN 28* 25* 20 15 13   CREATININE 1.60* 1.58* 1.48* 1.40* 1.43*  CALCIUM 8.2* 8.2* 8.6* 8.7* 8.8*  MG  --   --   --  1.5* 1.7  PHOS  --   --   --  3.7 3.3   GFR: Estimated Creatinine Clearance: 49.1 mL/min (A) (by C-G formula based on SCr of 1.43 mg/dL (H)). Liver Function Tests: Recent Labs  Lab 01/25/18 0508 01/28/18 0600 01/29/18 0444  AST 37 12* 11*  ALT 42 19 16  ALKPHOS 46 57 57  BILITOT 1.2 1.2 1.2  PROT 6.0* 5.6* 5.9*  ALBUMIN 2.9* 2.7* 2.6*   Recent Labs  Lab 01/25/18 0508  LIPASE 47   No results for input(s): AMMONIA in the last 168 hours. Coagulation Profile: Recent Labs  Lab 01/23/18 0418 01/24/18 0337 01/25/18 0508 01/26/18 0519 01/27/18 0434  INR 1.43 1.56 1.74 1.86 1.41   Cardiac Enzymes: No results for input(s): CKTOTAL, CKMB, CKMBINDEX, TROPONINI in the last 168 hours. BNP (last 3 results) No results for input(s): PROBNP in the last 8760 hours. HbA1C: No results for input(s): HGBA1C in the last 72 hours. CBG: Recent Labs  Lab 01/28/18 1203 01/28/18 1703 01/28/18 2046 01/29/18 0842 01/29/18 1235  GLUCAP 149* 167* 190* 140* 166*   Lipid Profile: No results for input(s): CHOL, HDL,  LDLCALC, TRIG, CHOLHDL, LDLDIRECT in the last 72 hours. Thyroid Function Tests: No results for input(s): TSH, T4TOTAL, FREET4, T3FREE, THYROIDAB in the last 72 hours. Anemia Panel: No results for input(s): VITAMINB12, FOLATE, FERRITIN, TIBC, IRON, RETICCTPCT in the last 72 hours. Sepsis Labs: Recent Labs  Lab 01/24/18 0824 01/25/18 0508  PROCALCITON 0.21 0.17  LATICACIDVEN 1.3  --     No results found for this or any previous visit (from the past 240 hour(s)).   Radiology Studies: No results found. Scheduled Meds: . carvedilol  12.5 mg Oral BID WC  . cholecalciferol  2,000 Units Oral Daily  . donepezil  10 mg Oral QHS  . ezetimibe  10 mg Oral QHS  . furosemide  40 mg Intravenous BID  . insulin aspart  0-5 Units Subcutaneous  QHS  . insulin aspart  0-9 Units Subcutaneous TID WC  . pantoprazole  40 mg Oral BID  . polyethylene glycol  17 g Oral Daily  . rosuvastatin  10 mg Oral Daily  . sodium chloride flush  3 mL Intravenous Q12H   Continuous Infusions: . sodium chloride 250 mL (01/29/18 1021)    LOS: 4 days   Kerney Elbe, DO [facility] PAGER is on [place]  If 7PM-7AM, please contact night-coverage [email or web address] Password [email or web address] 01/29/2018, 2:35 PM

## 2018-01-29 NOTE — Progress Notes (Signed)
Progress Note  Patient Name: Luis Palmer Date of Encounter: 01/29/2018  Primary Cardiologist: No primary care provider on file.   Subjective   84 year old gentleman with a history of known chronic systolic congestive heart failure, pacemaker, chronic atrial fibrillation, coronary artery disease-status post coronary artery bypass grafting.  He did was admitted with GI bleed.  Colonoscopy showed a colon mass in the ascending colon.  He is scheduled for partial colectomy tomorrow.  Pathology revealed adenocarcinoma.  The patient has had the preoperative evaluation by Dr. Tamala Julian.  Internal medicine team wants to know if we wanted to start heparin prior to surgery.  INR levels was 1.4 on August 31.  Inpatient Medications    Scheduled Meds: . carvedilol  12.5 mg Oral BID WC  . cholecalciferol  2,000 Units Oral Daily  . donepezil  10 mg Oral QHS  . ezetimibe  10 mg Oral QHS  . furosemide  40 mg Intravenous BID  . insulin aspart  0-5 Units Subcutaneous QHS  . insulin aspart  0-9 Units Subcutaneous TID WC  . pantoprazole  40 mg Oral BID  . polyethylene glycol  17 g Oral Daily  . potassium chloride  40 mEq Oral BID  . rosuvastatin  10 mg Oral Daily  . sodium chloride flush  3 mL Intravenous Q12H   Continuous Infusions: . sodium chloride 250 mL (01/29/18 1021)   PRN Meds: sodium chloride, acetaminophen **OR** acetaminophen, ondansetron **OR** ondansetron (ZOFRAN) IV, sodium chloride flush   Vital Signs    Vitals:   01/28/18 2227 01/29/18 0500 01/29/18 1448 01/29/18 1808  BP:  (!) 101/45 (!) 92/50 98/60  Pulse: 60 60 (!) 59 (!) 59  Resp: 18 18 20 18   Temp:  98.3 F (36.8 C) 97.8 F (36.6 C)   TempSrc:  Oral Oral   SpO2: 96% 98% 97% 99%  Weight:  105.3 kg    Height:        Intake/Output Summary (Last 24 hours) at 01/29/2018 1832 Last data filed at 01/29/2018 1150 Gross per 24 hour  Intake 240 ml  Output 1270 ml  Net -1030 ml   Filed Weights   01/27/18 0924  01/28/18 0622 01/29/18 0500  Weight: 108.1 kg 107 kg 105.3 kg    Telemetry    Atrial fib - Personally Reviewed  ECG    Atrial fibrillation- Personally Reviewed  Physical Exam   GEN:  Elderly gentleman in no acute distress.  Is slightly pale. Neck: No JVD Cardiac:  Irregularly irregular.  Soft systolic murmur Respiratory: Clear to auscultation bilaterally. GI: Soft, nontender, non-distended  MS: No edema; No deformity. Neuro:  Nonfocal  Psych: Normal affect   Labs    Chemistry Recent Labs  Lab 01/25/18 0508  01/27/18 0434 01/28/18 0600 01/29/18 0444  NA 139   < > 138 140 141  K 4.2   < > 3.5 3.3* 3.6  CL 110   < > 109 107 105  CO2 20*   < > 21* 26 28  GLUCOSE 155*   < > 117* 120* 120*  BUN 28*   < > 20 15 13   CREATININE 1.60*   < > 1.48* 1.40* 1.43*  CALCIUM 8.2*   < > 8.6* 8.7* 8.8*  PROT 6.0*  --   --  5.6* 5.9*  ALBUMIN 2.9*  --   --  2.7* 2.6*  AST 37  --   --  12* 11*  ALT 42  --   --  19  16  ALKPHOS 46  --   --  57 57  BILITOT 1.2  --   --  1.2 1.2  GFRNONAA 38*   < > 42* 45* 44*  GFRAA 44*   < > 49* 52* 51*  ANIONGAP 9   < > 8 7 8    < > = values in this interval not displayed.     Hematology Recent Labs  Lab 01/27/18 0434 01/28/18 0600 01/29/18 0444  WBC 5.5 5.8 5.5  RBC 3.09* 3.14* 3.13*  HGB 8.7* 8.6* 8.5*  HCT 26.9* 28.2* 27.0*  MCV 87.1 89.8 86.3  MCH 28.2 27.4 27.2  MCHC 32.3 30.5 31.5  RDW 17.0* 17.1* 16.9*  PLT 127* 155 156    Cardiac EnzymesNo results for input(s): TROPONINI in the last 168 hours. No results for input(s): TROPIPOC in the last 168 hours.   BNP Recent Labs  Lab 01/26/18 1051  BNP 1,290.3*     DDimer No results for input(s): DDIMER in the last 168 hours.   Radiology    No results found.  Cardiac Studies     Patient Profile     84 y.o. male with a recent diagnosis of adenocarcinoma of the colon.  He has chronic atrial fibrillation, chronic systolic congestive heart failure  Assessment & Plan    1.   Atrial fibrillation: The patient is now off Coumadin.  He does not need to be restarted on heparin prior to his surgery tomorrow morning.  He is already been seen by the cardiac team in the preoperative assessment has been provided in previous notes. He appears to be currently stable for his surgery.  Patient will be followed by the cardiology rounding team.  Plan will be to restart anticoagulation once the surgical team determines that his wounds are clean and dry.  2.  Chronic systolic congestive heart failure: The patient appears to be well compensated.  As noted by Dr. Tamala Julian and his note on August 30, the patient is at high risk for his upcoming surgery because of his cardiology issues but he is clearto proceed with surgery   For questions or updates, please contact [facility] Please consult [email or web address] for contact info under Cardiology/STEMI.      Signed, Mertie Moores, MD  01/29/2018, 6:32 PM  '

## 2018-01-29 NOTE — Progress Notes (Signed)
    Asked to comment on bridging anticoagulation.  He has been off of anticoagulation since admission.  He has a colon mass with anemia secondary to GI bleeding.  He is to have surgery tomorrow.  No indication to start heparin preop.   We will follow post op and will recommend resuming anticoagulation once he is thought to be low risk for bleeding from surgical wounds.

## 2018-01-29 NOTE — Progress Notes (Signed)
Subjective No acute events. Denies n/v. Denies abdominal pain. Passing flatus; having liquid yellowish BMs - no solid/formed stool. Denies any complaints this morning.  Objective: Vital signs in last 24 hours: Temp:  [97.9 F (36.6 C)-98.3 F (36.8 C)] 98.3 F (36.8 C) (09/02 0500) Pulse Rate:  [59-60] 60 (09/02 0500) Resp:  [16-18] 18 (09/02 0500) BP: (101-119)/(45-60) 101/45 (09/02 0500) SpO2:  [96 %-98 %] 98 % (09/02 0500) Weight:  [105.3 kg] 105.3 kg (09/02 0500) Last BM Date: 01/28/18  Intake/Output from previous day: 09/01 0701 - 09/02 0700 In: 610 [P.O.:560; IV Piggyback:50] Out: 2570 [Urine:2570] Intake/Output this shift: No intake/output data recorded.  Gen: NAD, comfortable CV: RRR Pulm: Normal work of breathing Abd: Obese, soft, NT. Ext: SCDs in place  Lab Results: CBC  Recent Labs    01/28/18 0600 01/29/18 0444  WBC 5.8 5.5  HGB 8.6* 8.5*  HCT 28.2* 27.0*  PLT 155 156   BMET Recent Labs    01/28/18 0600 01/29/18 0444  NA 140 141  K 3.3* 3.6  CL 107 105  CO2 26 28  GLUCOSE 120* 120*  BUN 15 13  CREATININE 1.40* 1.43*  CALCIUM 8.7* 8.8*   PT/INR Recent Labs    01/27/18 0434  LABPROT 17.1*  INR 1.41   ABG No results for input(s): PHART, HCO3 in the last 72 hours.  Invalid input(s): PCO2, PO2  Studies/Results:  Anti-infectives: Anti-infectives (From admission, onward)   None       Assessment/Plan: Patient Active Problem List   Diagnosis Date Noted  . Adenocarcinoma of colon ([place]) 01/25/2018  . Ischemic cardiomyopathy 01/25/2018  . Acute on chronic systolic CHF (congestive heart failure) ([place]) 01/25/2018  . Biventricular implantable cardioverter-defibrillator in situ 01/25/2018  . Atrial fibrillation, chronic ([place]) 01/25/2018  . Gastrointestinal bleeding, lower 01/25/2018  . Acute posthemorrhagic anemia   . Benign neoplasm of transverse colon   . Neoplasm of digestive system   . Melena   . Gastritis without bleeding   .  GI bleed 01/21/2018  . Iron deficiency anemia 10/02/2017   Mr. Koren is a pleasant 64yoM with hx HTN, DM, Afib, chronic systolic CHF (last EF ~37%), prior CABG, acute on chronic anemia likely 2/2 colon cancer - here with partially obstructing ascending colon CA  -INR reversed. Cardiology and [facility] following for assistance in his care. High cardiac risk but cleared as no further interventions were deemed necessary for additional optimization -CT C/A/P - mass appeared more in asc colon (vs transverse as indicated prior on endoscopy) but is tattoo'd. No metastatic dz; CEA 8.6 -Gentle bowel prep underway; clears during this time, NPO after midnight tonight; possible OR 9/3   LOS: 4 days   Sharon Mt. Dema Severin, M.D. [facility]

## 2018-01-30 ENCOUNTER — Encounter (HOSPITAL_COMMUNITY)
Admission: AD | Disposition: A | Discharge status: Home Health | Payer: Self-pay | Source: Other Acute Inpatient Hospital | Attending: Family Medicine

## 2018-01-30 ENCOUNTER — Inpatient Hospital Stay (HOSPITAL_COMMUNITY): Payer: Medicare Other | Admitting: Certified Registered"

## 2018-01-30 HISTORY — PX: LAPAROSCOPIC PARTIAL COLECTOMY: SHX5907

## 2018-01-30 HISTORY — PX: CYSTOSCOPY: SHX5120

## 2018-01-30 LAB — CBC WITH DIFFERENTIAL/PLATELET
ABS IMMATURE GRANULOCYTES: 0 10*3/uL (ref 0.0–0.1)
BASOS ABS: 0 10*3/uL (ref 0.0–0.1)
Basophils Relative: 1 %
Eosinophils Absolute: 0.2 10*3/uL (ref 0.0–0.7)
Eosinophils Relative: 3 %
HCT: 27.5 % — ABNORMAL LOW (ref 39.0–52.0)
HEMOGLOBIN: 8.7 g/dL — AB (ref 13.0–17.0)
Immature Granulocytes: 1 %
LYMPHS ABS: 1.6 10*3/uL (ref 0.7–4.0)
LYMPHS PCT: 27 %
MCH: 27.5 pg (ref 26.0–34.0)
MCHC: 31.6 g/dL (ref 30.0–36.0)
MCV: 87 fL (ref 78.0–100.0)
Monocytes Absolute: 1.2 10*3/uL — ABNORMAL HIGH (ref 0.1–1.0)
Monocytes Relative: 20 %
NEUTROS ABS: 2.9 10*3/uL (ref 1.7–7.7)
Neutrophils Relative %: 48 %
Platelets: 167 10*3/uL (ref 150–400)
RBC: 3.16 MIL/uL — ABNORMAL LOW (ref 4.22–5.81)
RDW: 16.6 % — ABNORMAL HIGH (ref 11.5–15.5)
WBC: 6 10*3/uL (ref 4.0–10.5)

## 2018-01-30 LAB — COMPREHENSIVE METABOLIC PANEL
ALK PHOS: 57 U/L (ref 38–126)
ALT: 14 U/L (ref 0–44)
ANION GAP: 8 (ref 5–15)
AST: 10 U/L — AB (ref 15–41)
Albumin: 2.8 g/dL — ABNORMAL LOW (ref 3.5–5.0)
BUN: 11 mg/dL (ref 8–23)
CALCIUM: 8.9 mg/dL (ref 8.9–10.3)
CO2: 28 mmol/L (ref 22–32)
CREATININE: 1.34 mg/dL — AB (ref 0.61–1.24)
Chloride: 103 mmol/L (ref 98–111)
GFR calc Af Amer: 55 mL/min — ABNORMAL LOW (ref >60)
GFR calc non Af Amer: 47 mL/min — ABNORMAL LOW (ref >60)
GLUCOSE: 121 mg/dL — AB (ref 70–99)
Potassium: 4.4 mmol/L (ref 3.5–5.1)
SODIUM: 139 mmol/L (ref 135–145)
Total Bilirubin: 1.1 mg/dL (ref 0.3–1.2)
Total Protein: 5.7 g/dL — ABNORMAL LOW (ref 6.5–8.1)

## 2018-01-30 LAB — GLUCOSE, CAPILLARY
GLUCOSE-CAPILLARY: 115 mg/dL — AB (ref 70–99)
GLUCOSE-CAPILLARY: 210 mg/dL — AB (ref 70–99)
GLUCOSE-CAPILLARY: 200 mg/dL — AB (ref 70–99)
Glucose-Capillary: 232 mg/dL — ABNORMAL HIGH (ref 70–99)

## 2018-01-30 LAB — PHOSPHORUS: PHOSPHORUS: 2.7 mg/dL (ref 2.5–4.6)

## 2018-01-30 LAB — SURGICAL PCR SCREEN
MRSA, PCR: NEGATIVE
Staphylococcus aureus: NEGATIVE

## 2018-01-30 LAB — MAGNESIUM: Magnesium: 2 mg/dL (ref 1.7–2.4)

## 2018-01-30 SURGERY — LAPAROSCOPIC PARTIAL COLECTOMY
Anesthesia: General | Site: Urethra

## 2018-01-30 MED ORDER — SODIUM CHLORIDE 0.9 % IR SOLN
Status: DC | PRN
  Administered 2018-01-30: 3000 mL

## 2018-01-30 MED ORDER — MORPHINE SULFATE (PF) 2 MG/ML IV SOLN
1.0000 mg | INTRAVENOUS | Status: DC | PRN
  Administered 2018-01-30: 4 mg via INTRAVENOUS
  Administered 2018-01-30: 2 mg via INTRAVENOUS
  Administered 2018-01-30: 4 mg via INTRAVENOUS
  Filled 2018-01-30 (×4): qty 1
  Filled 2018-01-30: qty 2

## 2018-01-30 MED ORDER — PROPOFOL 10 MG/ML IV BOLUS
INTRAVENOUS | Status: AC
  Filled 2018-01-30: qty 20

## 2018-01-30 MED ORDER — ONDANSETRON HCL 4 MG/2ML IJ SOLN
INTRAMUSCULAR | Status: DC | PRN
  Administered 2018-01-30: 4 mg via INTRAVENOUS

## 2018-01-30 MED ORDER — KCL IN DEXTROSE-NACL 20-5-0.9 MEQ/L-%-% IV SOLN
INTRAVENOUS | Status: DC
  Administered 2018-01-30 – 2018-02-01 (×5): via INTRAVENOUS
  Filled 2018-01-30 (×7): qty 1000

## 2018-01-30 MED ORDER — OXYCODONE HCL 5 MG PO TABS: 5.0000 mg | ORAL_TABLET | Freq: Once | ORAL | Status: DC | PRN

## 2018-01-30 MED ORDER — ALVIMOPAN 12 MG PO CAPS
12.0000 mg | ORAL_CAPSULE | ORAL | Status: AC
  Administered 2018-01-30: 12 mg via ORAL
  Filled 2018-01-30: qty 1

## 2018-01-30 MED ORDER — SUGAMMADEX SODIUM 200 MG/2ML IV SOLN
INTRAVENOUS | Status: DC | PRN
  Administered 2018-01-30: 220 mg via INTRAVENOUS

## 2018-01-30 MED ORDER — LIDOCAINE 2% (20 MG/ML) 5 ML SYRINGE
INTRAMUSCULAR | Status: DC | PRN
  Administered 2018-01-30: 60 mg via INTRAVENOUS

## 2018-01-30 MED ORDER — PHENYLEPHRINE HCL 10 MG/ML IJ SOLN
INTRAMUSCULAR | Status: DC | PRN
  Administered 2018-01-30: 120 ug via INTRAVENOUS
  Administered 2018-01-30: 160 ug via INTRAVENOUS
  Administered 2018-01-30 (×2): 120 ug via INTRAVENOUS

## 2018-01-30 MED ORDER — FENTANYL CITRATE (PF) 100 MCG/2ML IJ SOLN
25.0000 ug | INTRAMUSCULAR | Status: DC | PRN
  Administered 2018-01-30 (×2): 25 ug via INTRAVENOUS

## 2018-01-30 MED ORDER — ONDANSETRON HCL 4 MG/2ML IJ SOLN: 4.0000 mg | Freq: Four times a day (QID) | INTRAMUSCULAR | Status: DC | PRN

## 2018-01-30 MED ORDER — FENTANYL CITRATE (PF) 250 MCG/5ML IJ SOLN
INTRAMUSCULAR | Status: AC
  Filled 2018-01-30: qty 5

## 2018-01-30 MED ORDER — FENTANYL CITRATE (PF) 100 MCG/2ML IJ SOLN
INTRAMUSCULAR | Status: DC | PRN
  Administered 2018-01-30: 50 ug via INTRAVENOUS
  Administered 2018-01-30: 100 ug via INTRAVENOUS

## 2018-01-30 MED ORDER — TAMSULOSIN HCL 0.4 MG PO CAPS
0.4000 mg | ORAL_CAPSULE | Freq: Every day | ORAL | Status: DC
  Administered 2018-01-30 – 2018-02-11 (×13): 0.4 mg via ORAL
  Filled 2018-01-30 (×13): qty 1

## 2018-01-30 MED ORDER — LACTATED RINGERS IV SOLN
INTRAVENOUS | Status: DC
  Administered 2018-01-30 (×2): via INTRAVENOUS

## 2018-01-30 MED ORDER — ALVIMOPAN 12 MG PO CAPS
12.0000 mg | ORAL_CAPSULE | Freq: Two times a day (BID) | ORAL | Status: AC
  Administered 2018-01-31: 12 mg via ORAL
  Filled 2018-01-30: qty 1

## 2018-01-30 MED ORDER — FENTANYL CITRATE (PF) 100 MCG/2ML IJ SOLN
INTRAMUSCULAR | Status: AC
  Filled 2018-01-30: qty 2

## 2018-01-30 MED ORDER — HEMOSTATIC AGENTS (NO CHARGE) OPTIME
TOPICAL | Status: DC | PRN
  Administered 2018-01-30 (×2): 1 via TOPICAL

## 2018-01-30 MED ORDER — ONDANSETRON HCL 4 MG/2ML IJ SOLN
INTRAMUSCULAR | Status: AC
  Filled 2018-01-30: qty 2

## 2018-01-30 MED ORDER — ROCURONIUM BROMIDE 50 MG/5ML IV SOSY
PREFILLED_SYRINGE | INTRAVENOUS | Status: AC
  Filled 2018-01-30: qty 5

## 2018-01-30 MED ORDER — DEXAMETHASONE SODIUM PHOSPHATE 10 MG/ML IJ SOLN
INTRAMUSCULAR | Status: DC | PRN
  Administered 2018-01-30: 10 mg via INTRAVENOUS

## 2018-01-30 MED ORDER — SODIUM CHLORIDE 0.9 % IV SOLN
INTRAVENOUS | Status: DC | PRN
  Administered 2018-01-30: 70 ug/min via INTRAVENOUS
  Administered 2018-01-30: 30 ug/min via INTRAVENOUS

## 2018-01-30 MED ORDER — ROCURONIUM BROMIDE 50 MG/5ML IV SOSY
PREFILLED_SYRINGE | INTRAVENOUS | Status: DC | PRN
  Administered 2018-01-30: 20 mg via INTRAVENOUS
  Administered 2018-01-30: 50 mg via INTRAVENOUS
  Administered 2018-01-30: 20 mg via INTRAVENOUS
  Administered 2018-01-30: 50 mg via INTRAVENOUS

## 2018-01-30 MED ORDER — SODIUM CHLORIDE 0.9 % IV SOLN
2.0000 g | INTRAVENOUS | Status: AC
  Administered 2018-01-30: 2 g via INTRAVENOUS
  Filled 2018-01-30: qty 2

## 2018-01-30 MED ORDER — EPHEDRINE SULFATE 50 MG/ML IJ SOLN
INTRAMUSCULAR | Status: DC | PRN
  Administered 2018-01-30: 10 mg via INTRAVENOUS

## 2018-01-30 MED ORDER — 0.9 % SODIUM CHLORIDE (POUR BTL) OPTIME
TOPICAL | Status: DC | PRN
  Administered 2018-01-30 (×4): 1000 mL

## 2018-01-30 MED ORDER — ALBUMIN HUMAN 5 % IV SOLN
INTRAVENOUS | Status: DC | PRN
  Administered 2018-01-30: 11:00:00 via INTRAVENOUS

## 2018-01-30 MED ORDER — BUPIVACAINE-EPINEPHRINE 0.25% -1:200000 IJ SOLN
INTRAMUSCULAR | Status: DC | PRN
  Administered 2018-01-30: 14 mL

## 2018-01-30 MED ORDER — OXYCODONE HCL 5 MG/5ML PO SOLN: 5.0000 mg | Freq: Once | ORAL | Status: DC | PRN

## 2018-01-30 MED ORDER — LIDOCAINE HCL URETHRAL/MUCOSAL 2 % EX GEL
CUTANEOUS | Status: DC | PRN
  Administered 2018-01-30: 1 via URETHRAL

## 2018-01-30 MED ORDER — LIDOCAINE HCL URETHRAL/MUCOSAL 2 % EX GEL
CUTANEOUS | Status: AC
  Filled 2018-01-30: qty 20

## 2018-01-30 MED ORDER — SODIUM CHLORIDE 0.9% IV SOLUTION: Freq: Once | INTRAVENOUS | Status: DC

## 2018-01-30 MED ORDER — BUPIVACAINE-EPINEPHRINE (PF) 0.25% -1:200000 IJ SOLN
INTRAMUSCULAR | Status: AC
  Filled 2018-01-30: qty 30

## 2018-01-30 MED ORDER — PROPOFOL 10 MG/ML IV BOLUS
INTRAVENOUS | Status: DC | PRN
  Administered 2018-01-30: 120 mg via INTRAVENOUS

## 2018-01-30 SURGICAL SUPPLY — 89 items
APPLIER CLIP ROT 10 11.4 M/L (STAPLE)
BIOPATCH RED 1 DISK 7.0 (GAUZE/BANDAGES/DRESSINGS) ×2 IMPLANT
BLADE CLIPPER SURG (BLADE) ×2 IMPLANT
CANISTER SUCT 3000ML PPV (MISCELLANEOUS) ×4 IMPLANT
CATH FOLEY 2W COUNCIL 5CC 18FR (CATHETERS) ×2 IMPLANT
CELLS DAT CNTRL 66122 CELL SVR (MISCELLANEOUS) ×3 IMPLANT
CHLORAPREP W/TINT 26ML (MISCELLANEOUS) ×4 IMPLANT
CLIP APPLIE ROT 10 11.4 M/L (STAPLE) IMPLANT
COVER MAYO STAND STRL (DRAPES) ×2 IMPLANT
COVER SURGICAL LIGHT HANDLE (MISCELLANEOUS) ×8 IMPLANT
DRAIN CHANNEL 19F RND (DRAIN) ×2 IMPLANT
DRAPE HALF SHEET 40X57 (DRAPES) IMPLANT
DRAPE UTILITY XL STRL (DRAPES) ×10 IMPLANT
DRAPE WARM FLUID 44X44 (DRAPE) ×4 IMPLANT
DRSG OPSITE POSTOP 4X10 (GAUZE/BANDAGES/DRESSINGS) IMPLANT
DRSG OPSITE POSTOP 4X8 (GAUZE/BANDAGES/DRESSINGS) ×2 IMPLANT
DRSG TEGADERM 2-3/8X2-3/4 SM (GAUZE/BANDAGES/DRESSINGS) ×2 IMPLANT
ELECT BLADE 4.0 EZ CLEAN MEGAD (MISCELLANEOUS) ×4
ELECT BLADE 6.5 EXT (BLADE) ×4 IMPLANT
ELECT CAUTERY BLADE 6.4 (BLADE) ×8 IMPLANT
ELECT REM PT RETURN 9FT ADLT (ELECTROSURGICAL) ×4
ELECTRODE BLDE 4.0 EZ CLN MEGD (MISCELLANEOUS) ×1 IMPLANT
ELECTRODE REM PT RTRN 9FT ADLT (ELECTROSURGICAL) ×3 IMPLANT
EVACUATOR SILICONE 100CC (DRAIN) ×2 IMPLANT
GAUZE SPONGE 2X2 8PLY STRL LF (GAUZE/BANDAGES/DRESSINGS) ×1 IMPLANT
GEL ULTRASOUND 20GR AQUASONIC (MISCELLANEOUS) IMPLANT
GLOVE BIO SURGEON STRL SZ7.5 (GLOVE) ×8 IMPLANT
GOWN STRL REUS W/ TWL LRG LVL3 (GOWN DISPOSABLE) ×24 IMPLANT
GOWN STRL REUS W/TWL LRG LVL3 (GOWN DISPOSABLE) ×8
GUIDEWIRE STR DUAL SENSOR (WIRE) ×2 IMPLANT
HEMOSTAT SURGICEL 2X14 (HEMOSTASIS) ×2 IMPLANT
KIT BASIN OR (CUSTOM PROCEDURE TRAY) ×4 IMPLANT
KIT TURNOVER KIT B (KITS) ×4 IMPLANT
LEGGING LITHOTOMY PAIR STRL (DRAPES) IMPLANT
LIGASURE IMPACT 36 18CM CVD LR (INSTRUMENTS) IMPLANT
NS IRRIG 1000ML POUR BTL (IV SOLUTION) ×8 IMPLANT
PACK COLON (CUSTOM PROCEDURE TRAY) ×4 IMPLANT
PAD ARMBOARD 7.5X6 YLW CONV (MISCELLANEOUS) ×8 IMPLANT
PENCIL BUTTON HOLSTER BLD 10FT (ELECTRODE) ×6 IMPLANT
RELOAD PROXIMATE 75MM BLUE (ENDOMECHANICALS) ×16 IMPLANT
RELOAD STAPLE 75 3.8 BLU REG (ENDOMECHANICALS) IMPLANT
RETRACTOR WND ALEXIS 18 MED (MISCELLANEOUS) IMPLANT
RTRCTR WOUND ALEXIS 18CM MED (MISCELLANEOUS) ×4
SCISSORS LAP 5X35 DISP (ENDOMECHANICALS) ×4 IMPLANT
SET CYSTO W/LG BORE CLAMP LF (SET/KITS/TRAYS/PACK) ×2 IMPLANT
SET IRRIG TUBING LAPAROSCOPIC (IRRIGATION / IRRIGATOR) ×2 IMPLANT
SHEARS HARMONIC ACE PLUS 36CM (ENDOMECHANICALS) ×4 IMPLANT
SLEEVE ENDOPATH XCEL 5M (ENDOMECHANICALS) ×6 IMPLANT
SPECIMEN JAR LARGE (MISCELLANEOUS) ×4 IMPLANT
SPECIMEN JAR X LARGE (MISCELLANEOUS) ×4 IMPLANT
SPONGE GAUZE 2X2 STER 10/PKG (GAUZE/BANDAGES/DRESSINGS) ×1
SPONGE LAP 18X18 X RAY DECT (DISPOSABLE) ×2 IMPLANT
STAPLER GUN LINEAR PROX 60 (STAPLE) ×2 IMPLANT
STAPLER PROXIMATE 75MM BLUE (STAPLE) ×2 IMPLANT
STAPLER VISISTAT 35W (STAPLE) ×4 IMPLANT
SURGIFLO W/THROMBIN 8M KIT (HEMOSTASIS) ×2 IMPLANT
SURGILUBE 2OZ TUBE FLIPTOP (MISCELLANEOUS) IMPLANT
SUT ETHILON 2 0 FS 18 (SUTURE) ×2 IMPLANT
SUT PDS AB 1 CT  36 (SUTURE)
SUT PDS AB 1 CT 36 (SUTURE) ×4 IMPLANT
SUT PDS AB 1 TP1 96 (SUTURE) ×8 IMPLANT
SUT PROLENE 2 0 CT2 30 (SUTURE) IMPLANT
SUT PROLENE 2 0 KS (SUTURE) IMPLANT
SUT PROLENE 2 0 SH 30 (SUTURE) IMPLANT
SUT SILK 2 0 (SUTURE) ×1
SUT SILK 2 0 SH CR/8 (SUTURE) ×4 IMPLANT
SUT SILK 2 0 TIES 10X30 (SUTURE) ×6 IMPLANT
SUT SILK 2-0 18XBRD TIE 12 (SUTURE) ×3 IMPLANT
SUT SILK 3 0 (SUTURE) ×1
SUT SILK 3 0 SH CR/8 (SUTURE) ×6 IMPLANT
SUT SILK 3 0 TIES 10X30 (SUTURE) ×4 IMPLANT
SUT SILK 3-0 18XBRD TIE 12 (SUTURE) ×3 IMPLANT
SYR BULB IRRIGATION 50ML (SYRINGE) ×4 IMPLANT
SYR CONTROL 10ML LL (SYRINGE) ×2 IMPLANT
SYS LAPSCP GELPORT 120MM (MISCELLANEOUS)
SYSTEM LAPSCP GELPORT 120MM (MISCELLANEOUS) IMPLANT
TOWEL OR 17X26 10 PK STRL BLUE (TOWEL DISPOSABLE) ×8 IMPLANT
TRAY FOLEY MTR SLVR 14FR STAT (SET/KITS/TRAYS/PACK) ×2 IMPLANT
TRAY FOLEY MTR SLVR 16FR STAT (SET/KITS/TRAYS/PACK) ×4 IMPLANT
TRAY LAPAROSCOPIC MC (CUSTOM PROCEDURE TRAY) ×2 IMPLANT
TRAY PROCTOSCOPIC FIBER OPTIC (SET/KITS/TRAYS/PACK) IMPLANT
TROCAR XCEL 12X100 BLDLESS (ENDOMECHANICALS) IMPLANT
TROCAR XCEL BLUNT TIP 100MML (ENDOMECHANICALS) IMPLANT
TROCAR XCEL NON-BLD 11X100MML (ENDOMECHANICALS) IMPLANT
TROCAR XCEL NON-BLD 5MMX100MML (ENDOMECHANICALS) ×4 IMPLANT
TUBE CONNECTING 12X1/4 (SUCTIONS) ×8 IMPLANT
TUBING INSUFFLATION (TUBING) ×4 IMPLANT
WATER STERILE IRR 1000ML POUR (IV SOLUTION) ×4 IMPLANT
YANKAUER SUCT BULB TIP NO VENT (SUCTIONS) ×8 IMPLANT

## 2018-01-30 NOTE — Anesthesia Preprocedure Evaluation (Addendum)
Anesthesia Evaluation  Patient identified by MRN, date of birth, ID band Patient awake    Reviewed: Allergy & Precautions, H&P , NPO status , Patient's Chart, lab work & pertinent test results  Airway Mallampati: II   Neck ROM: full    Dental   Pulmonary sleep apnea , former smoker,    breath sounds clear to auscultation       Cardiovascular + CAD, + CABG and +CHF  + pacemaker + Cardiac Defibrillator  Rhythm:regular Rate:Normal     Neuro/Psych    GI/Hepatic   Endo/Other  diabetes, Type 2  Renal/GU Renal InsufficiencyRenal disease     Musculoskeletal   Abdominal   Peds  Hematology   Anesthesia Other Findings Luis Palmer is a 84 y.o. male w/ hx of severe ICM sp ICD, afib ablation x 2, CAD sp CABG, chron systolic CHF, chron afib admitted to [facility] on 01/21/18 w/ BRBPR of acute onset.  rec'd KCentra to reverse his anticoagulation and was transfused PRBC's.  Seen by GI who did EGD showing gastritis, and colonscopy showing colon mass in the ascending colon. Mass biopsy showed adenoCa of the colon.  Surgery recommended resection and pt was seen by cardiology and cleared for surgery.    Chronic systolic heart failure with EF less than 30%, by V pacemaker, chronic atrial fibrillation, CAD with known occlusion of the saphenous vein graft to right coronary and patent LIMA to LAD at time of cath in 2004, no anginal complaints, obstructive sleep apnea, who presented with progressive fatigue and was found to have severe anemia with work-up revealing a colonic mass requiring resection.  He is on stable beta-blocker and ARB therapy.  He has no heart failure symptoms or anginal complaints.  He is able to lie flat.  Moderate JVD with the patient lying at 20 degrees.  Lungs clear.  No significant murmurs are heard.  No peripheral edema.  Ischemic cardiomyopathy with severe systolic heart failure/dysfunction currently  compensated.  No ischemic symptoms.  Has pacemaker so bradycardia would not be a problem.  He is cleared for general anesthesia and the upcoming surgery but will be high risk because of his significant underlying cardiac issues.  This was discussed in detail with the patient and wife.  It is important to continue the patient's carvedilol.  Hold ARB therapy until after surgery and reinstitute as blood pressure allows.  Pacemaker interrogation to determine why there have occasionally been heart rates of 40 bpm.  Device representative has been advised.   Reproductive/Obstetrics                            Anesthesia Physical Anesthesia Plan  ASA: IV  Anesthesia Plan: General   Post-op Pain Management:    Induction: Intravenous  PONV Risk Score and Plan: 2 and Ondansetron, Dexamethasone and Treatment may vary due to age or medical condition  Airway Management Planned: Oral ETT  Additional Equipment: Arterial line  Intra-op Plan:   Post-operative Plan: Extubation in OR and Possible Post-op intubation/ventilation  Informed Consent: I have reviewed the patients History and Physical, chart, labs and discussed the procedure including the risks, benefits and alternatives for the proposed anesthesia with the patient or authorized representative who has indicated his/her understanding and acceptance.     Plan Discussed with: CRNA, Anesthesiologist and Surgeon  Anesthesia Plan Comments:         Anesthesia Quick Evaluation

## 2018-01-30 NOTE — Progress Notes (Signed)
Placed patient on CPAP for the night via auto-mode. Oxygen set at 2lpm with Sp02=98%

## 2018-01-30 NOTE — Op Note (Signed)
01/30/2018  1:26 PM  PATIENT:  Luis Palmer  84 y.o. male  PRE-OPERATIVE DIAGNOSIS:  Colon cancer  POST-OPERATIVE DIAGNOSIS:  Colon cancer at hepatic flexure  PROCEDURE:  Procedure(s): LAPAROSCOPIC ASSISTED EXTENDED RIGHT COLECTOMY (N/A) CYSTOSCOPY FLEXIBLE WITH INSERTION OF CATHETER (N/A)  SURGEON:  Surgeon(s) and Role:    * Jovita Kussmaul, MD - Primary    * Georganna Skeans, MD - Assisting    * Alexis Frock, MD - Assisting  PHYSICIAN ASSISTANT:   ASSISTANTS: Dr. Grandville Silos   ANESTHESIA:   general  EBL:  150 mL   BLOOD ADMINISTERED:none  DRAINS: (1) Jackson-Pratt drain(s) with closed bulb suction in the periduodenal area   LOCAL MEDICATIONS USED:  MARCAINE     SPECIMEN:  Source of Specimen:  right colon with terminal ileum  DISPOSITION OF SPECIMEN:  PATHOLOGY  COUNTS:  YES  TOURNIQUET:  * No tourniquets in log *  DICTATION: .Dragon Dictation   After informed consent was obtained the patient was brought to the operating room and placed in the supine position on the operating table.  After adequate induction of general anesthesia the patient's abdomen was prepped with ChloraPrep, allowed to dry, and draped in usual sterile manner.  An appropriate timeout was performed.  Of note prior to prepping a Foley basement was attempted but was unsuccessful.  An intraoperative urology consult was obtained.  They recommended starting our case and they would come as soon as they were available.  A site was chosen on the left upper quadrant for access in the abdominal cavity.  This area was infiltrated with quarter percent Marcaine.  A small incision was made with a 15 blade knife.  A 5 mm Optiview port and camera were used to bluntly dissected the layers of the abdominal wall under direct vision until access was gained to the abdominal cavity.  The abdomen was then insufflated with carbon dioxide without difficulty.  The abdomen was inspected in the area of tattooing was readily seen  at the hepatic flexure of the colon.  2 other ports were placed on the left side of the abdomen lower down under direct vision.  I was able to then mobilize the terminal ileum sharply with laparoscopic scissors.  There were adhesions there doubly from his previous appendectomy.  The right and transverse colons were then also mobilized by incising the retroperitoneal attachment along the white line of Toldt with the harmonic scalpel.  Once the right colon was good and mobile it was difficult to mobilize some of the transverse colon because of the bulkiness of the tumor.  At this point an upper midline incision was made with a 10 blade knife.  The incision was carried through the skin and subcutaneous tissue sharply with electrocautery until the linea alba was identified.  The linea alba was incised with the electrocautery and the peritoneum was opened sharply so that access was gained to the abdominal cavity.  A wound protractor was deployed.  I was then able to mobilize the rest of the transverse colon.  We then chose a place on the terminal ileum and on the transverse colon distal to the tumor where the bowel appeared healthy.  The mesentery at each site was opened sharply with the electrocautery.  A GIA-75 stapler was placed across each limb of bowel, clamped, and fired thereby dividing the bowel between staple lines.  The mesentery to the right colon was then taken down sharply with the LigaSure.  Care was taken to avoid the  duodenum.  Once this was accomplished the terminal IM right colon and proximal transverse colon were removed from the patient and sent to pathology.  The small bowel and transverse colon easily reached each other.  We then made an opening on the antimesenteric surface of each limb of transverse colon and small bowel with the electrocautery.  Each limb of a GIA-75 stapler was then placed down the appropriate limb of bowel, clamped, and fired thereby creating a nice widely patent  enteroenterostomy.  The anastomosis was inspected and was hemostatic.  The common opening was then closed with a firing of the GIA-75 stapler.  The mesenteric defect was then closed with 2-0 silk figure-of-eight stitches.  The operative site was then examined and there was a small bleeding vessel that appeared to be on the surface of the pancreatic head.  A 3-0 silk stitch was used to try to control this but this did not stop the bleeding.  At this point I applied Surgi-Flo and Surgicel to the area and held pressure for 5 to 10 minutes.  On reexamination the bleeding had stopped.  I placed a 73 [place] round Blake drain through 1 of the 5 mm port sites and placed it in the general area around the duodenum and head of the pancreas.  Prior to this the abdomen was irrigated with copious amounts of saline.  At this point the rest of the abdomen looked clean.  The fascia was then closed with 2 running #1 double-stranded loop PDS sutures.  The subcutaneous tissue was irrigated with copious amounts of saline.  The skin was closed with staples.  The drain was placed to bulb suction and there was a good seal.  The patient tolerated the procedure well.  At the end of the case all needle sponge and instrument counts were correct.  The patient was then awakened and taken to recovery in stable condition.  PLAN OF CARE: Admit to inpatient   PATIENT DISPOSITION:  PACU - hemodynamically stable.   Delay start of Pharmacological VTE agent (>24hrs) due to surgical blood loss or risk of bleeding: yes

## 2018-01-30 NOTE — Progress Notes (Signed)
PROGRESS NOTE    Luis Palmer  CHE:[id number] DOB: [birth year] DOA: 01/25/2018 PCP: Idelle Crouch, MD   Brief Narrative:  The patient is an 84 year old male with history of severe ischemic cardiomyopathy status post ICD, A. fib post ablation x2, CAD with history of CABG, chronic systolic CHF and chronic A. fib on anticoagulation was admitted to [facility] on 8/25 with acute onset of bright red blood per rectum.  He was given Kcentra to reverese anticoagulation and transfused PRBCs.  Patient underwent colonoscopy showing colon mass in the ascending colon that was biopsied showing adenocarcinoma of the colon.    He was seen by General Surgery who recommended resection.  Cardiology evaluated who cleared for surgery.  CT of the chest showed findings of CHF which seemed stable.  Patient and family wanted to be transferred to [facility] for surgery given concern for extensive heart disease.  Patient admitted to hospitalist service and General Surgery and Cardiology evaluated at [facility]. He is high risk for surgery but cleared and anticipated patient to have surgery later on today   Assessment & Plan:   Active Problems:   Iron deficiency anemia   GI bleed   Acute posthemorrhagic anemia   Neoplasm of digestive system   Adenocarcinoma of colon (HCC)   Ischemic cardiomyopathy   Acute on chronic systolic CHF (congestive heart failure) (HCC)   Biventricular implantable cardioverter-defibrillator in situ   Atrial fibrillation, chronic (HCC)   Gastrointestinal bleeding, lower  Lower GI bleed with adenocarcinoma of the colon -No further active bleeding.  -Coumadin on hold and was reversed at [place] [facility].  -Hemoglobin stable (received PRBC transfusion at [facility]) and now is 8.7/27.5 today  -Monitor H&H closely. Type and screen.  INR 1.41 on 8/31  (Received Vitamin K during this Admission) -Seen by surgery with plan on OR early next week (9/3) as patient  needs to get bowel prepped as well and gentle prep started per general surgery and will continue MiraLAX 17 g p.o. daily.  -Cardiology Evaluated for Pre-Op Clearance but pateint is high risk -CEA was 8.6 -Continue clears for now and NPO after Midnight  -Appreciate Surgical and Cardiology evaluation and recommendations. -Patient to go for Laparoscopic Right Partial Colectomy today   Acute Blood Loss Anemia -Secondary to rectal bleed with adenocarcinoma. -Patient admits to having dark tarry stools but did not "pay any attention to it" and then had Bright Red Blood.  -Hemoglobin stable at 8.7 today.  Has low iron stores as Anemia Panel showed iron level of 17, U IBC 194, TIBC of 211, and saturation ratios of 8%.   -Transfuse as needed and if hemoglobin drops below 7 -EGD showed gastritis.   -As Above -Hb/HCt now 8.7/27.5  Ischemic Cardiomyopathy -EF of 20%   Has V pacemaker.   -No anginal symptoms or an acute CHF.  Dyspnea on exertion likely due to symptomatic anemia. -Cardiology consult appreciated.  Cleared for general anesthesia but recommend patient to be high risk given significant cardiac issues.   -Recommend to continue beta-blocker, hold ARB until after surgery.  Continue twice daily Lasix.   -Pacemaker interrogation per Cards  -BNP on admission was 1290.3 -Monitor strict I/O and daily weight. Patient is -2.440 Liters and Weight is down 3 Lb   Chronic A. fib -Continue beta-blocker with carvedilol 12.5 mg p.o. twice daily.   -Coumadin held and INR reversed and is now 1.41 -Rate controlled on telemetry. -Cardiology following appreciate further recommendations evaluation; Defer to Cardiology  to start Heparin gtt if they feel necessary   Chronic kidney disease stage III -Renal function seems at baseline.   -Pain went from 28/1.60 and is now 11/1.34 -Stable -Avoid nephrotoxic medications possible -Continue with home Lasix 40 mg p.o. twice daily as above per cardiology -We  will hold ARB per cardiology recommendations  Essential hypertension -Blood pressure stable.   -Holding ARB.  -Continue Beta-Blocker as above.  Mild dementia -Continue Donepezil 10 mg p.o. nightly  CAD with history of CABG -No chest pain symptoms.  Continue beta-blocker and statin with rosuvastatin 10 g p.o. nightly. -Holding ARB as mentioned above and is not on any aspirin at this time due to bleeding  Diabetes mellitus type 2, controlled -Not on home meds.  Monitor with sliding scale coverage. -CBG's ranging from 115-210  Hyperlipidemia -Continue with Zetia 10 mg p.o. nightly and Rosuvastatin p.o. daily  Hypomagnesemia, improved -Patient magnesium was 1.5 and improved 2.0 -Replete with IV mag sulfate 2 g yesterday  -Continue monitor replete as necessary -Repeat magnesium level in a.m.  Hypokalemia, improved -Patient potassium was 3.3 and improved to 4.4 -In the setting of po Lasix BID -Replete with p.o. potassium chloride 40 mg p.o. twice daily x2 doses yesterday  -Continue monitor and replete as necessary -Repeat CMP in a.m.  DVT prophylaxis: SCDs as Anticoagulation with Coumadin being held Code Status: FULL CODE Family Communication: Discussed plan of care with wife at bedside Disposition Plan: Pending Surgical Clearance; Patient to go for Colectomy today   Consultants:   Cardiology   General Surgery    Procedures:  ECHOCARDIOGRAM 01/24/18 ------------------------------------------------------------------- Study Conclusions  - Left ventricle: The cavity size was severely dilated. Systolic   function was severely reduced. The estimated ejection fraction   was 20%. Diffuse hypokinesis. - Aortic valve: Valve area (Vmax): 2.16 cm^2. - Mitral valve: There was mild to moderate regurgitation. - Left atrium: The atrium was mildly dilated. - Right ventricle: The cavity size was mildly dilated. - Right atrium: The atrium was mildly dilated. - Tricuspid valve:  There was mild-moderate regurgitation.   Antimicrobials:  Anti-infectives (From admission, onward)   Start     Dose/Rate Route Frequency Ordered Stop   01/30/18 1030  cefoTEtan (CEFOTAN) 2 g in sodium chloride 0.9 % 100 mL IVPB     2 g 200 mL/hr over 30 Minutes Intravenous To Surgery 01/30/18 1027 01/30/18 1042     Subjective: Seen and examined at bedside prior to his colectomy.  Denies any chest pain, shortness breath, nausea, vomiting.  States that he did not sleep very well because the mask on his CPAP was not fitting properly.  No other concerns or events at this time is ready for the procedure by Dr. Marlou Starks.  Objective: Vitals:   01/30/18 1355 01/30/18 1410 01/30/18 1425 01/30/18 1455  BP: 115/60 (!) 118/58 (!) 112/55   Pulse: 60 (!) 59 (!) 59   Resp: 19 17 16    Temp:    (!) 97.5 F (36.4 C)  TempSrc:    Oral  SpO2: 96% 94% 96%   Weight:      Height:        Intake/Output Summary (Last 24 hours) at 01/30/2018 1459 Last data filed at 01/30/2018 1336 Gross per 24 hour  Intake 1350 ml  Output 350 ml  Net 1000 ml   Filed Weights   01/28/18 0622 01/29/18 0500 01/30/18 0500  Weight: 107 kg 105.3 kg 105.1 kg   Examination: Physical Exam:  Constitutional: WN/WD obese Caucasian  male in NAD appears calm but slightly anxious awaiting surgery in Pre-Op Eyes: Sclerae anicteric. Lids and conjunctivae normal ENMT: External Ears and nose appear normal. MMM Neck: Supple with no JVD Respiratory: Diminished to auscultation. No appreciable wheezing/rales/rhonchi. Unlabored breathing  Cardiovascular: RRR; Has a systolic murmur. Trace LE edema Abdomen: Soft, NT, Slightly Distended due to body habitus. Bowel Sounds present x 4 GU: Deferred Musculoskeletal: No contractures; No cyanosis. No joint deformities Skin: Warm and Dry; No appreciable rashes or lesions on a limited skin eval Neurologic: CN 2-12 grossly intact with no appreciable focal deficits Psychiatric: Normal Mood and affect.  Intact judgement and insight. Awake and Alert and Ox3  Data Reviewed: I have personally reviewed following labs and imaging studies  CBC: Recent Labs  Lab 01/26/18 0519 01/27/18 0434 01/28/18 0600 01/29/18 0444 01/30/18 0430  WBC 6.2 5.5 5.8 5.5 6.0  NEUTROABS  --   --  3.1 2.8 2.9  HGB 8.2* 8.7* 8.6* 8.5* 8.7*  HCT 25.8* 26.9* 28.2* 27.0* 27.5*  MCV 87.5 87.1 89.8 86.3 87.0  PLT 127* 127* 155 156 761   Basic Metabolic Panel: Recent Labs  Lab 01/26/18 0519 01/27/18 0434 01/28/18 0600 01/29/18 0444 01/30/18 0430  NA 138 138 140 141 139  K 3.8 3.5 3.3* 3.6 4.4  CL 110 109 107 105 103  CO2 21* 21* 26 28 28   GLUCOSE 147* 117* 120* 120* 121*  BUN 25* 20 15 13 11   CREATININE 1.58* 1.48* 1.40* 1.43* 1.34*  CALCIUM 8.2* 8.6* 8.7* 8.8* 8.9  MG  --   --  1.5* 1.7 2.0  PHOS  --   --  3.7 3.3 2.7   GFR: Estimated Creatinine Clearance: 52.3 mL/min (A) (by C-G formula based on SCr of 1.34 mg/dL (H)). Liver Function Tests: Recent Labs  Lab 01/25/18 0508 01/28/18 0600 01/29/18 0444 01/30/18 0430  AST 37 12* 11* 10*  ALT 42 19 16 14   ALKPHOS 46 57 57 57  BILITOT 1.2 1.2 1.2 1.1  PROT 6.0* 5.6* 5.9* 5.7*  ALBUMIN 2.9* 2.7* 2.6* 2.8*   Recent Labs  Lab 01/25/18 0508  LIPASE 47   No results for input(s): AMMONIA in the last 168 hours. Coagulation Profile: Recent Labs  Lab 01/24/18 0337 01/25/18 0508 01/26/18 0519 01/27/18 0434  INR 1.56 1.74 1.86 1.41   Cardiac Enzymes: No results for input(s): CKTOTAL, CKMB, CKMBINDEX, TROPONINI in the last 168 hours. BNP (last 3 results) No results for input(s): PROBNP in the last 8760 hours. HbA1C: No results for input(s): HGBA1C in the last 72 hours. CBG: Recent Labs  Lab 01/29/18 1235 01/29/18 1720 01/29/18 2107 01/30/18 0815 01/30/18 1405  GLUCAP 166* 155* 174* 115* 210*   Lipid Profile: No results for input(s): CHOL, HDL, LDLCALC, TRIG, CHOLHDL, LDLDIRECT in the last 72 hours. Thyroid Function Tests: No  results for input(s): TSH, T4TOTAL, FREET4, T3FREE, THYROIDAB in the last 72 hours. Anemia Panel: No results for input(s): VITAMINB12, FOLATE, FERRITIN, TIBC, IRON, RETICCTPCT in the last 72 hours. Sepsis Labs: Recent Labs  Lab 01/24/18 0824 01/25/18 0508  PROCALCITON 0.21 0.17  LATICACIDVEN 1.3  --     Recent Results (from the past 240 hour(s))  Surgical pcr screen     Status: None   Collection Time: 01/30/18  5:19 AM  Result Value Ref Range Status   MRSA, PCR NEGATIVE NEGATIVE Final   Staphylococcus aureus NEGATIVE NEGATIVE Final    Comment: (NOTE) The Xpert SA Assay (FDA approved for NASAL specimens in patients  49 years of age and older), is one component of a comprehensive surveillance program. It is not intended to diagnose infection nor to guide or monitor treatment. Performed at [facility], [place] [address], [place], [place] [address]      Radiology Studies: No results found. Scheduled Meds: . sodium chloride   Intravenous Once  . [START ON 01/31/2018] alvimopan  12 mg Oral BID  . carvedilol  12.5 mg Oral BID WC  . cholecalciferol  2,000 Units Oral Daily  . donepezil  10 mg Oral QHS  . ezetimibe  10 mg Oral QHS  . fentaNYL      . furosemide  40 mg Intravenous BID  . insulin aspart  0-5 Units Subcutaneous QHS  . insulin aspart  0-9 Units Subcutaneous TID WC  . pantoprazole  40 mg Oral BID  . polyethylene glycol  17 g Oral Daily  . rosuvastatin  10 mg Oral Daily  . sodium chloride flush  3 mL Intravenous Q12H  . tamsulosin  0.4 mg Oral QHS   Continuous Infusions: . sodium chloride 250 mL (01/29/18 1021)  . dextrose 5 % and 0.9 % NaCl with KCl 20 mEq/L    . lactated ringers Stopped (01/30/18 1336)    LOS: 5 days   Kerney Elbe, DO [facility] PAGER is on [place]  If 7PM-7AM, please contact night-coverage [email or web address] Password [email or web address] 01/30/2018, 2:59 PM

## 2018-01-30 NOTE — Consult Note (Signed)
Reason for Consult: Enlarged Prostate / Difficult Foley, Stage 3 Renal Insufficiency   Referring Physician: Autumn Messing MD  Luis Palmer is an 84 y.o. male.   HPI:   1 - Enlarged Prostate / Difficult Foley - patient undergoing Rt colon resection today under care of general surgery team for colon cancer and noted to have very difficult foley placement. No known prior GU problems / procedures / meds. CT 2019 with very large prostate (175m by ellipsoid calculation). Bedside cysto with only large BPH changes.  2 - Stage 3 Renal Insufficiency - Cr 1.5's x years. CT 2019 w/o hydro.   Today "Luis Palmer is seen as urgent intra-operative consult for difficult foley.    Past Medical History:  Diagnosis Date  . AICD (automatic cardioverter/defibrillator) present 2006  . Anemia   . Atrial fibrillation ([facility]   . [place] ([place]estive heart failure) ([facility]   . CKD (chronic kidney disease), stage III ([place]   . Colon cancer ([facility]    "dx'd 01/24/2018"  . High cholesterol   . History of blood transfusion 01/22/2018   "I lost blood; HgB extremely low"  . History of gout   . Myocardial infarction ([place] 1986  . OSA on CPAP   . Pneumonia 2015  . Type II diabetes mellitus ([facility]     Past Surgical History:  Procedure Laterality Date  . APPENDECTOMY    . ATRIAL FIBRILLATION ABLATION  X 2  . CARDIAC CATHETERIZATION    . CARDIAC DEFIBRILLATOR PLACEMENT  2006   "added lead and had battery changed once since the initial OR" (01/29/2018)  . CATARACT EXTRACTION W/ INTRAOCULAR LENS  IMPLANT, BILATERAL Bilateral   . COLONOSCOPY WITH PROPOFOL N/A 01/23/2018   Procedure: COLONOSCOPY WITH PROPOFOL;  Surgeon: WLucilla Lame MD;  [facility];  Service: Endoscopy;  Laterality: N/A;  . CORONARY ARTERY BYPASS GRAFT  1986   "CABG X2"  . ESOPHAGOGASTRODUODENOSCOPY (EGD) WITH PROPOFOL N/A 01/22/2018   Procedure: ESOPHAGOGASTRODUODENOSCOPY (EGD) WITH PROPOFOL;  Surgeon: WLucilla Lame MD;  Location: [facility] ENDOSCOPY;   Service: Endoscopy;  Laterality: N/A;  . INSERT / REPLACE / REMOVE PACEMAKER    . NASAL SINUS SURGERY      Family History  Problem Relation Age of Onset  . Congestive Heart Failure Mother   . Diabetes Father   . Stroke Father   . Lung cancer Brother     Social History:  reports that he quit smoking about 33 years ago. His smoking use included cigarettes. He has a 40.00 pack-year smoking history. He has never used smokeless tobacco. He reports that he drank alcohol. He reports that he does not use drugs.  Allergies:  Allergies  Allergen Reactions  . Pseudoephedrine Hcl Other (See Comments)    Other Reaction: ANURIA  . Ace Inhibitors Cough and Other (See Comments)    cough cough   . Nsaids Other (See Comments)    Reaction:  Fluid retention   . Vasotec [Enalapril] Cough    Medications: I have reviewed the patient's current medications.  Results for orders placed or performed during the hospital encounter of 01/25/18 (from the past 48 hour(s))  Glucose, capillary     Status: Abnormal   Collection Time: 01/28/18  5:03 PM  Result Value Ref Range   Glucose-Capillary 167 (H) 70 - 99 mg/dL  Glucose, capillary     Status: Abnormal   Collection Time: 01/28/18  8:46 PM  Result Value Ref Range   Glucose-Capillary 190 (H) 70 - 99 mg/dL  CBC with Differential/Platelet     Status: Abnormal   Collection Time: 01/29/18  4:44 AM  Result Value Ref Range   WBC 5.5 4.0 - 10.5 K/uL   RBC 3.13 (L) 4.22 - 5.81 MIL/uL   Hemoglobin 8.5 (L) 13.0 - 17.0 g/dL   HCT 27.0 (L) 39.0 - 52.0 %   MCV 86.3 78.0 - 100.0 fL   MCH 27.2 26.0 - 34.0 pg   MCHC 31.5 30.0 - 36.0 g/dL   RDW 16.9 (H) 11.5 - 15.5 %   Platelets 156 150 - 400 K/uL   Neutrophils Relative % 50 %   Neutro Abs 2.8 1.7 - 7.7 K/uL   Lymphocytes Relative 23 %   Lymphs Abs 1.3 0.7 - 4.0 K/uL   Monocytes Relative 22 %   Monocytes Absolute 1.2 (H) 0.1 - 1.0 K/uL   Eosinophils Relative 3 %   Eosinophils Absolute 0.2 0.0 - 0.7 K/uL    Basophils Relative 0 %   Basophils Absolute 0.0 0.0 - 0.1 K/uL   Immature Granulocytes 0 %   Abs Immature Granulocytes 0.0 0.0 - 0.1 K/uL    Comment: Performed at [facility], [address] [place], [place] [address]  Comprehensive metabolic panel     Status: Abnormal   Collection Time: 01/29/18  4:44 AM  Result Value Ref Range   Sodium 141 135 - 145 mmol/L   Potassium 3.6 3.5 - 5.1 mmol/L   Chloride 105 98 - 111 mmol/L   CO2 28 22 - 32 mmol/L   Glucose, Bld 120 (H) 70 - 99 mg/dL   BUN 13 8 - 23 mg/dL   Creatinine, Ser 1.43 (H) 0.61 - 1.24 mg/dL   Calcium 8.8 (L) 8.9 - 10.3 mg/dL   Total Protein 5.9 (L) 6.5 - 8.1 g/dL   Albumin 2.6 (L) 3.5 - 5.0 g/dL   AST 11 (L) 15 - 41 U/L   ALT 16 0 - 44 U/L   Alkaline Phosphatase 57 38 - 126 U/L   Total Bilirubin 1.2 0.3 - 1.2 mg/dL   GFR calc non Af Amer 44 (L) >60 mL/min   GFR calc Af Amer 51 (L) >60 mL/min    Comment: (NOTE) The eGFR has been calculated using the CKD EPI equation. This calculation has not been validated in all clinical situations. eGFR's persistently <60 mL/min signify possible Chronic Kidney Disease.    Anion gap 8 5 - 15    Comment: Performed at [facility] [address] [place], [place] [address]  Magnesium     Status: None   Collection Time: 01/29/18  4:44 AM  Result Value Ref Range   Magnesium 1.7 1.7 - 2.4 mg/dL    Comment: Performed at [facility] [address], [place], [place] [address]  Phosphorus     Status: None   Collection Time: 01/29/18  4:44 AM  Result Value Ref Range   Phosphorus 3.3 2.5 - 4.6 mg/dL    Comment: Performed at [facility] [address] [place], [place] [address]  Glucose, capillary     Status: Abnormal   Collection Time: 01/29/18  8:42 AM  Result Value Ref Range   Glucose-Capillary 140 (H) 70 - 99 mg/dL  Glucose, capillary     Status: Abnormal   Collection Time: 01/29/18 12:35 PM  Result Value Ref Range   Glucose-Capillary 166 (H) 70 - 99  mg/dL  Glucose, capillary     Status: Abnormal  Collection Time: 01/29/18  5:20 PM  Result Value Ref Range   Glucose-Capillary 155 (H) 70 - 99 mg/dL  Glucose, capillary     Status: Abnormal   Collection Time: 01/29/18  9:07 PM  Result Value Ref Range   Glucose-Capillary 174 (H) 70 - 99 mg/dL  CBC with Differential/Platelet     Status: Abnormal   Collection Time: 01/30/18  4:30 AM  Result Value Ref Range   WBC 6.0 4.0 - 10.5 K/uL   RBC 3.16 (L) 4.22 - 5.81 MIL/uL   Hemoglobin 8.7 (L) 13.0 - 17.0 g/dL   HCT 27.5 (L) 39.0 - 52.0 %   MCV 87.0 78.0 - 100.0 fL   MCH 27.5 26.0 - 34.0 pg   MCHC 31.6 30.0 - 36.0 g/dL   RDW 16.6 (H) 11.5 - 15.5 %   Platelets 167 150 - 400 K/uL   Neutrophils Relative % 48 %   Neutro Abs 2.9 1.7 - 7.7 K/uL   Lymphocytes Relative 27 %   Lymphs Abs 1.6 0.7 - 4.0 K/uL   Monocytes Relative 20 %   Monocytes Absolute 1.2 (H) 0.1 - 1.0 K/uL   Eosinophils Relative 3 %   Eosinophils Absolute 0.2 0.0 - 0.7 K/uL   Basophils Relative 1 %   Basophils Absolute 0.0 0.0 - 0.1 K/uL   Immature Granulocytes 1 %   Abs Immature Granulocytes 0.0 0.0 - 0.1 K/uL    Comment: Performed at [facility], [address], [place], [place] [address]  Comprehensive metabolic panel     Status: Abnormal   Collection Time: 01/30/18  4:30 AM  Result Value Ref Range   Sodium 139 135 - 145 mmol/L   Potassium 4.4 3.5 - 5.1 mmol/L   Chloride 103 98 - 111 mmol/L   CO2 28 22 - 32 mmol/L   Glucose, Bld 121 (H) 70 - 99 mg/dL   BUN 11 8 - 23 mg/dL   Creatinine, Ser 1.34 (H) 0.61 - 1.24 mg/dL   Calcium 8.9 8.9 - 10.3 mg/dL   Total Protein 5.7 (L) 6.5 - 8.1 g/dL   Albumin 2.8 (L) 3.5 - 5.0 g/dL   AST 10 (L) 15 - 41 U/L   ALT 14 0 - 44 U/L   Alkaline Phosphatase 57 38 - 126 U/L   Total Bilirubin 1.1 0.3 - 1.2 mg/dL   GFR calc non Af Amer 47 (L) >60 mL/min   GFR calc Af Amer 55 (L) >60 mL/min    Comment: (NOTE) The eGFR has been calculated using the CKD EPI equation. This  calculation has not been validated in all clinical situations. eGFR's persistently <60 mL/min signify possible Chronic Kidney Disease.    Anion gap 8 5 - 15    Comment: Performed at [facility] [address] [place], [place] [address]  Magnesium     Status: None   Collection Time: 01/30/18  4:30 AM  Result Value Ref Range   Magnesium 2.0 1.7 - 2.4 mg/dL    Comment: Performed at [facility] [address], [place], [place] [address]  Phosphorus     Status: None   Collection Time: 01/30/18  4:30 AM  Result Value Ref Range   Phosphorus 2.7 2.5 - 4.6 mg/dL    Comment: Performed at [facility] [address], [place], [place] [address]  Surgical pcr screen     Status: None   Collection Time: 01/30/18  5:19 AM  Result Value  Ref Range   MRSA, PCR NEGATIVE NEGATIVE   Staphylococcus aureus NEGATIVE NEGATIVE    Comment: (NOTE) The Xpert SA Assay (FDA approved for NASAL specimens in patients 79 years of age and older), is one component of a comprehensive surveillance program. It is not intended to diagnose infection nor to guide or monitor treatment. Performed at [facility], [place] [address], [place], [place] [address]   Glucose, capillary     Status: Abnormal   Collection Time: 01/30/18  8:15 AM  Result Value Ref Range   Glucose-Capillary 115 (H) 70 - 99 mg/dL  Glucose, capillary     Status: Abnormal   Collection Time: 01/30/18  2:05 PM  Result Value Ref Range   Glucose-Capillary 210 (H) 70 - 99 mg/dL   Comment 1 Notify RN     No results found.  Review of Systems  Unable to perform ROS: Intubated   Blood pressure 115/60, pulse 60, temperature 97.9 F (36.6 C), resp. rate 19, height 6' (1.829 m), weight 105.1 kg, SpO2 96 %. Physical Exam  Constitutional: He appears well-developed.  Intubated in OR 1 with open abdomen undergoing colon resection.   HENT:  Head: Normocephalic.  Neck: Normal range of motion.  Cardiovascular: Normal rate.   Respiratory: Effort normal.  GI:  Abdomen open.   Genitourinary: Penis normal.  Genitourinary Comments: UNcircumcised. Non-distended bladder.   Musculoskeletal: Normal range of motion.  Neurological:  GCS 3T  Skin: Skin is warm.   BEDSIDE CYSTOSCOPY: Penis already prepped into separate operative field with iodine. Cystourethroscopy performed with 67F flexible cystoscopye. NO stricure / false pass / urethral trauma. Very large bilobar BPH in prostate. Mild bladder trabeculation w/o bladder lesions. Retroflexion without additional findings. Sensor wire left in bladder over which 65F council catheter placed with 10cc sterile water in balloon and connected to straight drain.   Assessment/Plan:  1 - Enlarged Prostate / Difficult Foley - catheter placed today as per above. I feel pt is adequte for trial of void POD 2 or 3 as per routine. I placed him on tamsulosin, but this does not need to continue at discharge. Fortunatley this appears to only be from large BPH changes.   2 - Stage 3 Renal Insufficiency - medical renal disease.   I will follow pt PRN at this point. Please call me directly with questions anytime.   ,  01/30/2018, 2:10 PM

## 2018-01-30 NOTE — Progress Notes (Signed)
   Subjective/Chief Complaint: No complaints   Objective: Vital signs in last 24 hours: Temp:  [97.8 F (36.6 C)-98.6 F (37 C)] 98.6 F (37 C) (09/03 0500) Pulse Rate:  [59-62] 62 (09/03 0500) Resp:  [17-20] 17 (09/03 0500) BP: (92-120)/(33-60) 97/33 (09/03 0500) SpO2:  [97 %-99 %] 98 % (09/03 0500) Weight:  [105.1 kg] 105.1 kg (09/03 0500) Last BM Date: 01/28/18  Intake/Output from previous day: 09/02 0701 - 09/03 0700 In: 240 [P.O.:240] Out: 300 [Urine:300] Intake/Output this shift: No intake/output data recorded.  General appearance: alert and cooperative Resp: clear to auscultation bilaterally Cardio: regular rate and rhythm GI: soft, non-tender; bowel sounds normal; no masses,  no organomegaly  Lab Results:  Recent Labs    01/29/18 0444 01/30/18 0430  WBC 5.5 6.0  HGB 8.5* 8.7*  HCT 27.0* 27.5*  PLT 156 167   BMET Recent Labs    01/29/18 0444 01/30/18 0430  NA 141 139  K 3.6 4.4  CL 105 103  CO2 28 28  GLUCOSE 120* 121*  BUN 13 11  CREATININE 1.43* 1.34*  CALCIUM 8.8* 8.9   PT/INR No results for input(s): LABPROT, INR in the last 72 hours. ABG No results for input(s): PHART, HCO3 in the last 72 hours.  Invalid input(s): PCO2, PO2  Studies/Results: No results found.  Anti-infectives: Anti-infectives (From admission, onward)   None      Assessment/Plan: s/p Procedure(s): EXPLORATORY LAPAROTOMY (N/A) PARTIAL COLECTOMY (N/A) plan for partial colectomy today. Risks and benefits of the surgery as well as some of the technical aspects including MI and Stroke and Leak were discussed with the patient and he understands and wishes to proceed  LOS: 5 days    TOTH III,Shaakira Borrero S 01/30/2018

## 2018-01-30 NOTE — Interval H&P Note (Signed)
History and Physical Interval Note:  01/30/2018 9:44 AM  Luis Palmer  has presented today for surgery, with the diagnosis of Colon cancer  The various methods of treatment have been discussed with the patient and family. After consideration of risks, benefits and other options for treatment, the patient has consented to  Procedure(s): LAPAROSCOPIC PARTIAL COLECTOMY (N/A) PARTIAL COLECTOMY (N/A) as a surgical intervention .  The patient's history has been reviewed, patient examined, no change in status, stable for surgery.  I have reviewed the patient's chart and labs.  Questions were answered to the patient's satisfaction.     TOTH III,PAUL S

## 2018-01-30 NOTE — Anesthesia Postprocedure Evaluation (Signed)
Anesthesia Post Note  Patient: Luis Palmer  Procedure(s) Performed: LAPAROSCOPIC ASSISTED EXTENDED RIGHT COLECTOMY (N/A Abdomen) CYSTOSCOPY FLEXIBLE WITH INSERTION OF CATHETER (N/A Urethra)     Patient location during evaluation: PACU Anesthesia Type: General Level of consciousness: awake and alert Pain management: pain level controlled Vital Signs Assessment: post-procedure vital signs reviewed and stable Respiratory status: spontaneous breathing, nonlabored ventilation, respiratory function stable and patient connected to nasal cannula oxygen Cardiovascular status: blood pressure returned to baseline and stable Postop Assessment: no apparent nausea or vomiting Anesthetic complications: no    Last Vitals:  Vitals:   01/30/18 1455 01/30/18 1500  BP:  (!) 116/57  Pulse:  62  Resp:  19  Temp: (!) 36.4 C   SpO2:  97%    Last Pain:  Vitals:   01/30/18 1500  TempSrc:   PainSc: [place]

## 2018-01-30 NOTE — OR Nursing (Signed)
Placement of a 44F foley catheter was attempted after induction. The catheter advanced appropriately with no output noted. Dr Marlou Starks then attempted to place a 2F coude catheter; still no output. There was scant bleeding at the meatus. Urology was contacted; Dr Tresa Moore is en route.

## 2018-01-30 NOTE — Transfer of Care (Signed)
Immediate Anesthesia Transfer of Care Note  Patient: Kimi Kroft  Procedure(s) Performed: LAPAROSCOPIC ASSISTED EXTENDED RIGHT COLECTOMY (N/A Abdomen) CYSTOSCOPY FLEXIBLE WITH INSERTION OF CATHETER (N/A Urethra)  Patient Location: PACU  Anesthesia Type:General  Level of Consciousness: awake and patient cooperative  Airway & Oxygen Therapy: Patient Spontanous Breathing and Patient connected to face mask oxygen  Post-op Assessment: Report given to RN and Post -op Vital signs reviewed and stable  Post vital signs: Reviewed and stable  Last Vitals:  Vitals Value Taken Time  BP 116/61 01/30/2018  1:41 PM  Temp    Pulse 62 01/30/2018  1:42 PM  Resp 21 01/30/2018  1:42 PM  SpO2 97 % 01/30/2018  1:42 PM  Vitals shown include unvalidated device data.  Last Pain:  Vitals:   01/30/18 0829  TempSrc:   PainSc: 0-No pain         Complications: No apparent anesthesia complications

## 2018-01-30 NOTE — Anesthesia Procedure Notes (Signed)
Procedure Name: Intubation Date/Time: 01/30/2018 10:13 AM Performed by: Lance Coon, CRNA Pre-anesthesia Checklist: Patient identified, Emergency Drugs available, Suction available, Patient being monitored and Timeout performed Patient Re-evaluated:Patient Re-evaluated prior to induction Oxygen Delivery Method: Circle system utilized Preoxygenation: Pre-oxygenation with 100% oxygen Induction Type: IV induction Ventilation: Mask ventilation without difficulty Laryngoscope Size: Miller and 3 Grade View: Grade I Tube type: Oral Tube size: 7.5 mm Number of attempts: 1 Airway Equipment and Method: Stylet Placement Confirmation: ETT inserted through vocal cords under direct vision,  positive ETCO2 and breath sounds checked- equal and bilateral Secured at: 22 cm Tube secured with: Tape Dental Injury: Teeth and Oropharynx as per pre-operative assessment

## 2018-01-30 NOTE — H&P (View-Only) (Signed)
   Subjective/Chief Complaint: No complaints   Objective: Vital signs in last 24 hours: Temp:  [97.8 F (36.6 C)-98.6 F (37 C)] 98.6 F (37 C) (09/03 0500) Pulse Rate:  [59-62] 62 (09/03 0500) Resp:  [17-20] 17 (09/03 0500) BP: (92-120)/(33-60) 97/33 (09/03 0500) SpO2:  [97 %-99 %] 98 % (09/03 0500) Weight:  [105.1 kg] 105.1 kg (09/03 0500) Last BM Date: 01/28/18  Intake/Output from previous day: 09/02 0701 - 09/03 0700 In: 240 [P.O.:240] Out: 300 [Urine:300] Intake/Output this shift: No intake/output data recorded.  General appearance: alert and cooperative Resp: clear to auscultation bilaterally Cardio: regular rate and rhythm GI: soft, non-tender; bowel sounds normal; no masses,  no organomegaly  Lab Results:  Recent Labs    01/29/18 0444 01/30/18 0430  WBC 5.5 6.0  HGB 8.5* 8.7*  HCT 27.0* 27.5*  PLT 156 167   BMET Recent Labs    01/29/18 0444 01/30/18 0430  NA 141 139  K 3.6 4.4  CL 105 103  CO2 28 28  GLUCOSE 120* 121*  BUN 13 11  CREATININE 1.43* 1.34*  CALCIUM 8.8* 8.9   PT/INR No results for input(s): LABPROT, INR in the last 72 hours. ABG No results for input(s): PHART, HCO3 in the last 72 hours.  Invalid input(s): PCO2, PO2  Studies/Results: No results found.  Anti-infectives: Anti-infectives (From admission, onward)   None      Assessment/Plan: s/p Procedure(s): EXPLORATORY LAPAROTOMY (N/A) PARTIAL COLECTOMY (N/A) plan for partial colectomy today. Risks and benefits of the surgery as well as some of the technical aspects including MI and Stroke and Leak were discussed with the patient and he understands and wishes to proceed  LOS: 5 days    TOTH III,Delisia Mcquiston S 01/30/2018

## 2018-01-31 ENCOUNTER — Encounter (HOSPITAL_COMMUNITY): Payer: Self-pay | Admitting: General Surgery

## 2018-01-31 ENCOUNTER — Inpatient Hospital Stay (HOSPITAL_COMMUNITY): Payer: Medicare Other

## 2018-01-31 LAB — CBC WITH DIFFERENTIAL/PLATELET
Abs Immature Granulocytes: 0 10*3/uL (ref 0.0–0.1)
Basophils Absolute: 0 10*3/uL (ref 0.0–0.1)
Basophils Relative: 0 %
Eosinophils Absolute: 0 10*3/uL (ref 0.0–0.7)
Eosinophils Relative: 0 %
HEMATOCRIT: 26.9 % — AB (ref 39.0–52.0)
Hemoglobin: 8.4 g/dL — ABNORMAL LOW (ref 13.0–17.0)
IMMATURE GRANULOCYTES: 0 %
LYMPHS ABS: 0.6 10*3/uL — AB (ref 0.7–4.0)
LYMPHS PCT: 13 %
MCH: 27.8 pg (ref 26.0–34.0)
MCHC: 31.2 g/dL (ref 30.0–36.0)
MCV: 89.1 fL (ref 78.0–100.0)
Monocytes Absolute: 0.3 10*3/uL (ref 0.1–1.0)
Monocytes Relative: 6 %
NEUTROS ABS: 4 10*3/uL (ref 1.7–7.7)
NEUTROS PCT: 81 %
Platelets: 149 10*3/uL — ABNORMAL LOW (ref 150–400)
RBC: 3.02 MIL/uL — AB (ref 4.22–5.81)
RDW: 16.4 % — AB (ref 11.5–15.5)
WBC: 4.9 10*3/uL (ref 4.0–10.5)

## 2018-01-31 LAB — COMPREHENSIVE METABOLIC PANEL
ALBUMIN: 2.8 g/dL — AB (ref 3.5–5.0)
ALT: 12 U/L (ref 0–44)
AST: 10 U/L — AB (ref 15–41)
Alkaline Phosphatase: 54 U/L (ref 38–126)
Anion gap: 10 (ref 5–15)
BILIRUBIN TOTAL: 1.1 mg/dL (ref 0.3–1.2)
BUN: 12 mg/dL (ref 8–23)
CHLORIDE: 105 mmol/L (ref 98–111)
CO2: 25 mmol/L (ref 22–32)
Calcium: 8.5 mg/dL — ABNORMAL LOW (ref 8.9–10.3)
Creatinine, Ser: 1.36 mg/dL — ABNORMAL HIGH (ref 0.61–1.24)
GFR calc Af Amer: 54 mL/min — ABNORMAL LOW (ref >60)
GFR calc non Af Amer: 46 mL/min — ABNORMAL LOW (ref >60)
GLUCOSE: 263 mg/dL — AB (ref 70–99)
POTASSIUM: 4.7 mmol/L (ref 3.5–5.1)
Sodium: 140 mmol/L (ref 135–145)
Total Protein: 5.6 g/dL — ABNORMAL LOW (ref 6.5–8.1)

## 2018-01-31 LAB — GLUCOSE, CAPILLARY
GLUCOSE-CAPILLARY: 234 mg/dL — AB (ref 70–99)
GLUCOSE-CAPILLARY: 221 mg/dL — AB (ref 70–99)
GLUCOSE-CAPILLARY: 207 mg/dL — AB (ref 70–99)
Glucose-Capillary: 237 mg/dL — ABNORMAL HIGH (ref 70–99)

## 2018-01-31 LAB — MAGNESIUM: Magnesium: 2 mg/dL (ref 1.7–2.4)

## 2018-01-31 LAB — PHOSPHORUS: Phosphorus: 3.8 mg/dL (ref 2.5–4.6)

## 2018-01-31 MED ORDER — INSULIN GLARGINE 100 UNIT/ML ~~LOC~~ SOLN
5.0000 [IU] | Freq: Every day | SUBCUTANEOUS | Status: DC
  Administered 2018-01-31 – 2018-02-11 (×12): 5 [IU] via SUBCUTANEOUS
  Filled 2018-01-31 (×14): qty 0.05

## 2018-01-31 NOTE — Progress Notes (Signed)
[facility]/Trauma Progress Note  1 Day Post-Op   Assessment/Plan Active Problems:   Iron deficiency anemia   GI bleed   Acute posthemorrhagic anemia   Neoplasm of digestive system   Adenocarcinoma of colon (HCC)   Ischemic cardiomyopathy   Acute on chronic systolic CHF (congestive heart failure) (HCC)   Biventricular implantable cardioverter-defibrillator in situ   Atrial fibrillation, chronic (HCC)   Gastrointestinal bleeding, lower   partially obstructing ascending colon CA  - S/P laparoscopic assisted extended right colectomy, cystoscopy with insertion of catheter, Dr. Marlou Starks & Dr. Tresa Moore with Urology - leave foley in for 2-3 days post op per Urology - await return of bowel function - follow Hg, am labs  FEN: sips with meds and chips VTE: SCD's, okay to restart heparin drip no bolus ID: [email or web address] pre-op Foley: yes, continue Follow up:  Dr. Marlou Starks  DISPO: await return of bowel function, path pending, okay to transfer to floor on tele    LOS: 6 days    Subjective: CC: S/P  Partial colectomy  No abdominal pain. No nausea, vomiting, belching, fever or chills. No flatus. Wife at bedside. No issues overnight.   Objective: Vital signs in last 24 hours: Temp:  [97.4 F (36.3 C)-97.9 F (36.6 C)] 97.4 F (36.3 C) (09/04 0421) Pulse Rate:  [59-75] 60 (09/04 0600) Resp:  [0-22] 9 (09/04 0600) BP: (110-136)/(55-69) 110/67 (09/04 0300) SpO2:  [88 %-98 %] 88 % (09/04 0600) Arterial Line BP: (116-160)/(44-65) 160/59 (09/04 0600) Weight:  [100.6 kg-103 kg] 103 kg (09/04 0351) Last BM Date: 01/28/18  Intake/Output from previous day: 09/03 0701 - 09/04 0700 In: 2488 [I.V.:2193; IV Piggyback:250] Out: 2055 [Urine:1775; Drains:130; Blood:150] Intake/Output this shift: No intake/output data recorded.  PE: Gen:  Alert, NAD, pleasant, cooperative Pulm: rate and effort normal Abd: Soft, NT/ND, +BS, incisions C/D/I, drain with minimal sanguinous drainage Skin: no  rashes noted, warm and dry   Anti-infectives: Anti-infectives (From admission, onward)   Start     Dose/Rate Route Frequency Ordered Stop   01/30/18 1030  cefoTEtan (CEFOTAN) 2 g in sodium chloride 0.9 % 100 mL IVPB     2 g 200 mL/hr over 30 Minutes Intravenous To Surgery 01/30/18 1027 01/30/18 1042      Lab Results:  Recent Labs    01/30/18 0430 01/31/18 0350  WBC 6.0 4.9  HGB 8.7* 8.4*  HCT 27.5* 26.9*  PLT 167 149*   BMET Recent Labs    01/30/18 0430 01/31/18 0350  NA 139 140  K 4.4 4.7  CL 103 105  CO2 28 25  GLUCOSE 121* 263*  BUN 11 12  CREATININE 1.34* 1.36*  CALCIUM 8.9 8.5*   PT/INR No results for input(s): LABPROT, INR in the last 72 hours. CMP     Component Value Date/Time   NA 140 01/31/2018 0350   NA 133 (L) 08/03/2013 0455   K 4.7 01/31/2018 0350   K 4.7 08/03/2013 0455   CL 105 01/31/2018 0350   CL 101 08/03/2013 0455   CO2 25 01/31/2018 0350   CO2 27 08/03/2013 0455   GLUCOSE 263 (H) 01/31/2018 0350   GLUCOSE 151 (H) 08/03/2013 0455   BUN 12 01/31/2018 0350   BUN 16 08/03/2013 0455   CREATININE 1.36 (H) 01/31/2018 0350   CREATININE 1.13 08/03/2013 0455   CALCIUM 8.5 (L) 01/31/2018 0350   CALCIUM 9.2 08/03/2013 0455   PROT 5.6 (L) 01/31/2018 0350   PROT 6.6 07/31/2013 0437   ALBUMIN 2.8 (  L) 01/31/2018 0350   ALBUMIN 2.6 (L) 07/31/2013 0437   AST 10 (L) 01/31/2018 0350   AST 20 07/31/2013 0437   ALT 12 01/31/2018 0350   ALT 27 07/31/2013 0437   ALKPHOS 54 01/31/2018 0350   ALKPHOS 86 07/31/2013 0437   BILITOT 1.1 01/31/2018 0350   BILITOT 1.2 (H) 07/31/2013 0437   GFRNONAA 46 (L) 01/31/2018 0350   GFRNONAA >60 08/03/2013 0455   GFRAA 54 (L) 01/31/2018 0350   GFRAA >60 08/03/2013 0455   Lipase     Component Value Date/Time   LIPASE 47 01/25/2018 0508    Studies/Results: No results found.    Kalman Drape , [facility] Surgery 01/31/2018, 7:54 AM  Pager: [phone] Mon-Wed, Friday 7:00am-4:30pm Thurs  7am-11:30am  Consults: [phone]

## 2018-01-31 NOTE — Progress Notes (Signed)
Report given to 6N RN at this time.

## 2018-01-31 NOTE — Progress Notes (Signed)
[facility]  PROGRESS NOTE  Luis Palmer TFT:[id number] DOB: 02/07/1933 DOA: 01/25/2018 PCP: Idelle Crouch, MD   Brief HPI:   84 year old male with a history of ischemic cardiomyopathy, status post ICD, atrial fibrillation status post ablation x2, CAD with history of CABG, chronic systolic heart failure, chronic atrial fibrillation on anticoagulation was initially admitted to [facility] hospital day on 01/21/2018 with acute onset of bright red blood per rectum.  He was given Kcentra to reverse anticoagulation and transfuse PRBC.  He underwent colonoscopy showing colon mass in the ascending colon that was biopsied showing adenocarcinoma of the colon.    He was seen by general surgery and was recommended resection.  Cardiology saw the patient cleared for surgery.  Family wanted to be transferred to [facility] for surgery given extensive history of heart disease.  Patient underwent laparoscopic-assisted extended right colectomy, cystoscopy with insertion of Foley catheter.  Subjective   Patient seen and examined, still n.p.o. General surgery following, denies chest pain or shortness of breath.   Assessment/Plan:     1. Status post laparoscopic right colectomy-patient was diagnosed to have adenocarcinoma of ascending colon.  Underwent laparoscopic-assisted extended right colectomy.  Patient is currently n.p.o.  Has not passed flatus yet.  General surgery following.  2. Acute blood loss anemia-secondary to rectal bleeding with adenocarcinoma, hemoglobin is stable at 8.4.  Has low iron stores, iron level of 17, U IBC in 194, TIBC 211, saturation ratio 8%.  EGD showed gastritis.  Follow hemoglobin and transfuse for hemoglobin less than 7.  3. Ischemic cardiomyopathy-EF of 20%, continue beta-blocker, ARB on hold.  Continue twice daily Lasix.  Patient has V pacemaker, pacemaker interrogation as per cardiology.  4. Chronic atrial fibrillation-heart rate is controlled, continue  Coreg 12.5 mg twice a day.  Coumadin is on hold, INR was reversed.  Cardiology to recommend regarding restarting heparin as per note from Dr. Percival Spanish on 01/29/2018.  5. Chronic kidney disease stage III-creatinine stable, at baseline.  Today creatinine is 1.36.  Patient is on Lasix 40 mg IV every 12 hours.  ARB on hold as per cardiology recommendation.  6. Hypertension-blood pressure stable, ARB on hold, continue Coreg  7. Mild dementia-continue Aricept 10 mg p.o. Daily  8. CAD with history of CABG-continue Coreg, rosuvastatin  9. Diabetes mellitus type 2-blood glucose is elevated in 200 range, patient is on D5 half-normal saline.  We will add Lantus 5 units subcu at bedtime  10. Hypomagnesemia-replete, today magnesium was 2.0      CBG: Recent Labs  Lab 01/30/18 1405 01/30/18 1635 01/30/18 2210 01/31/18 0804 01/31/18 1220  GLUCAP 210* 200* 232* 237* 234*    CBC: Recent Labs  Lab 01/27/18 0434 01/28/18 0600 01/29/18 0444 01/30/18 0430 01/31/18 0350  WBC 5.5 5.8 5.5 6.0 4.9  NEUTROABS  --  3.1 2.8 2.9 4.0  HGB 8.7* 8.6* 8.5* 8.7* 8.4*  HCT 26.9* 28.2* 27.0* 27.5* 26.9*  MCV 87.1 89.8 86.3 87.0 89.1  PLT 127* 155 156 167 149*    Basic Metabolic Panel: Recent Labs  Lab 01/27/18 0434 01/28/18 0600 01/29/18 0444 01/30/18 0430 01/31/18 0350  NA 138 140 141 139 140  K 3.5 3.3* 3.6 4.4 4.7  CL 109 107 105 103 105  CO2 21* 26 28 28 25   GLUCOSE 117* 120* 120* 121* 263*  BUN 20 15 13 11 12   CREATININE 1.48* 1.40* 1.43* 1.34* 1.36*  CALCIUM 8.6* 8.7* 8.8* 8.9 8.5*  MG  --  1.5* 1.7  2.0 2.0  PHOS  --  3.7 3.3 2.7 3.8     DVT prophylaxis: SCDs  Code Status: Full code  Family Communication: No family at bedside  Disposition Plan: likely home when medically ready for discharge   Consultants:  General surgery  Cardiology  Procedures:  Laparoscopic assisted right colectomy   Antibiotics:   Anti-infectives (From admission, onward)   Start      Dose/Rate Route Frequency Ordered Stop   01/30/18 1030  cefoTEtan (CEFOTAN) 2 g in sodium chloride 0.9 % 100 mL IVPB     2 g 200 mL/hr over 30 Minutes Intravenous To Surgery 01/30/18 1027 01/30/18 1042       Objective   Vitals:   01/31/18 1000 01/31/18 1100 01/31/18 1200 01/31/18 1222  BP: (!) 114/54 (!) 117/52 (!) 120/53   Pulse: (!) 59 (!) 58 (!) 59   Resp: 15 16 11    Temp:    (!) 97.3 F (36.3 C)  TempSrc:    Oral  SpO2: 98% 96% 97%   Weight:      Height:        Intake/Output Summary (Last 24 hours) at 01/31/2018 1512 Last data filed at 01/31/2018 1200 Gross per 24 hour  Intake 1526.05 ml  Output 2705 ml  Net -1178.95 ml   Filed Weights   01/30/18 0500 01/30/18 1500 01/31/18 0351  Weight: 105.1 kg 100.6 kg 103 kg     Physical Examination:    General: Appears in no acute distress  Cardiovascular: S1-S2, regular  Respiratory: Clear to auscultation bilaterally  Abdomen: Soft, nontender, no organomegaly  Extremities: No edema of the lower extremities  Neurologic: Alert, oriented x3, no focal defecit     Data Reviewed: I have personally reviewed following labs and imaging studies   Recent Results (from the past 240 hour(s))  Surgical pcr screen     Status: None   Collection Time: 01/30/18  5:19 AM  Result Value Ref Range Status   MRSA, PCR NEGATIVE NEGATIVE Final   Staphylococcus aureus NEGATIVE NEGATIVE Final    Comment: (NOTE) The Xpert SA Assay (FDA approved for NASAL specimens in patients 68 years of age and older), is one component of a comprehensive surveillance program. It is not intended to diagnose infection nor to guide or monitor treatment. Performed at [facility], [place] [address] [place], [place] [address]      Liver Function Tests: Recent Labs  Lab 01/25/18 0508 01/28/18 0600 01/29/18 0444 01/30/18 0430 01/31/18 0350  AST 37 12* 11* 10* 10*  ALT 42 19 16 14 12   ALKPHOS 46 57 57 57 54  BILITOT 1.2 1.2 1.2 1.1 1.1   PROT 6.0* 5.6* 5.9* 5.7* 5.6*  ALBUMIN 2.9* 2.7* 2.6* 2.8* 2.8*   Recent Labs  Lab 01/25/18 0508  LIPASE 47   No results for input(s): AMMONIA in the last 168 hours.  Cardiac Enzymes: No results for input(s): CKTOTAL, CKMB, CKMBINDEX, TROPONINI in the last 168 hours. BNP (last 3 results) Recent Labs    01/26/18 1051  BNP 1,290.3*    ProBNP (last 3 results) No results for input(s): PROBNP in the last 8760 hours.    Studies: Dg Chest Port 1 View  Result Date: 01/31/2018 CLINICAL DATA:  Shortness of breath EXAM: PORTABLE CHEST 1 VIEW COMPARISON:  January 25, 2018 FINDINGS: There is no edema or consolidation. There is slight left base atelectasis. There is stable cardiomegaly. Pacemaker leads are attached to the right atrium, right ventricle, and  coronary sinus. Patient is status post coronary artery bypass grafting. There is aortic atherosclerosis. No adenopathy. No evident bone lesions. IMPRESSION: Mild left base atelectasis. No edema or consolidation. Stable cardiomegaly. Stable pacemaker lead placements. There is aortic atherosclerosis. Aortic Atherosclerosis (ICD10-I70.0). Electronically Signed   By: Lowella Grip III M.D.   On: 01/31/2018 08:12    Scheduled Meds: . sodium chloride   Intravenous Once  . carvedilol  12.5 mg Oral BID WC  . cholecalciferol  2,000 Units Oral Daily  . donepezil  10 mg Oral QHS  . ezetimibe  10 mg Oral QHS  . furosemide  40 mg Intravenous BID  . insulin aspart  0-5 Units Subcutaneous QHS  . insulin aspart  0-9 Units Subcutaneous TID WC  . pantoprazole  40 mg Oral BID  . polyethylene glycol  17 g Oral Daily  . rosuvastatin  10 mg Oral Daily  . sodium chloride flush  3 mL Intravenous Q12H  . tamsulosin  0.4 mg Oral QHS      Time spent: 25 min  [facility] Pager [phone]. If 7PM-7AM, please contact night-coverage at [email or web address], Office  [phone]  password [email or web address]  01/31/2018, 3:12 PM  LOS: 6 days

## 2018-01-31 NOTE — Progress Notes (Signed)
Pt irritable and hungry, insisting to have something liquids. Paged DR Hulen Skains, verified if pt was on Entereg, per chart pt had completed Entereg for 9/3.  Md stated pt can have sips of clear liquids. Will monitor pt.

## 2018-01-31 NOTE — Progress Notes (Signed)
Attempted report X 1; awaiting call back.

## 2018-01-31 NOTE — Progress Notes (Signed)
Received patient as a transfer. Patient ambulated to bed with assistance. Tele placed on patient and confirmed with CCMD. Family at bedside. Report given to night RN, Nellie.

## 2018-02-01 LAB — COMPREHENSIVE METABOLIC PANEL
ALBUMIN: 2.9 g/dL — AB (ref 3.5–5.0)
ALK PHOS: 51 U/L (ref 38–126)
ALT: 11 U/L (ref 0–44)
AST: 10 U/L — AB (ref 15–41)
Anion gap: 10 (ref 5–15)
BILIRUBIN TOTAL: 1 mg/dL (ref 0.3–1.2)
BUN: 14 mg/dL (ref 8–23)
CALCIUM: 8.9 mg/dL (ref 8.9–10.3)
CO2: 28 mmol/L (ref 22–32)
Chloride: 103 mmol/L (ref 98–111)
Creatinine, Ser: 1.33 mg/dL — ABNORMAL HIGH (ref 0.61–1.24)
GFR calc Af Amer: 55 mL/min — ABNORMAL LOW (ref >60)
GFR calc non Af Amer: 48 mL/min — ABNORMAL LOW (ref >60)
GLUCOSE: 213 mg/dL — AB (ref 70–99)
Potassium: 4.2 mmol/L (ref 3.5–5.1)
Sodium: 141 mmol/L (ref 135–145)
TOTAL PROTEIN: 5.8 g/dL — AB (ref 6.5–8.1)

## 2018-02-01 LAB — CBC
HEMATOCRIT: 27.6 % — AB (ref 39.0–52.0)
HEMOGLOBIN: 8.7 g/dL — AB (ref 13.0–17.0)
MCH: 27.5 pg (ref 26.0–34.0)
MCHC: 31.5 g/dL (ref 30.0–36.0)
MCV: 87.3 fL (ref 78.0–100.0)
Platelets: 155 10*3/uL (ref 150–400)
RBC: 3.16 MIL/uL — ABNORMAL LOW (ref 4.22–5.81)
RDW: 16.6 % — ABNORMAL HIGH (ref 11.5–15.5)
WBC: 7.2 10*3/uL (ref 4.0–10.5)

## 2018-02-01 LAB — GLUCOSE, CAPILLARY
GLUCOSE-CAPILLARY: 178 mg/dL — AB (ref 70–99)
GLUCOSE-CAPILLARY: 209 mg/dL — AB (ref 70–99)
GLUCOSE-CAPILLARY: 173 mg/dL — AB (ref 70–99)
Glucose-Capillary: 174 mg/dL — ABNORMAL HIGH (ref 70–99)

## 2018-02-01 MED ORDER — LOSARTAN POTASSIUM 50 MG PO TABS
25.0000 mg | ORAL_TABLET | Freq: Every day | ORAL | Status: DC
  Administered 2018-02-01 – 2018-02-05 (×5): 25 mg via ORAL
  Filled 2018-02-01 (×5): qty 1

## 2018-02-01 MED ORDER — BOOST / RESOURCE BREEZE PO LIQD CUSTOM
1.0000 | Freq: Three times a day (TID) | ORAL | Status: DC
  Administered 2018-02-01 – 2018-02-05 (×5): 1 via ORAL

## 2018-02-01 MED ORDER — ADULT MULTIVITAMIN W/MINERALS CH
1.0000 | ORAL_TABLET | Freq: Every day | ORAL | Status: DC
  Administered 2018-02-01 – 2018-02-12 (×12): 1 via ORAL
  Filled 2018-02-01 (×12): qty 1

## 2018-02-01 MED ORDER — HEPARIN SODIUM (PORCINE) 5000 UNIT/ML IJ SOLN
5000.0000 [IU] | Freq: Three times a day (TID) | INTRAMUSCULAR | Status: DC
  Administered 2018-02-01 – 2018-02-02 (×2): 5000 [IU] via SUBCUTANEOUS
  Filled 2018-02-01 (×4): qty 1

## 2018-02-01 MED ORDER — FUROSEMIDE 10 MG/ML IJ SOLN
40.0000 mg | Freq: Every day | INTRAMUSCULAR | Status: DC
  Administered 2018-02-02: 40 mg via INTRAVENOUS
  Filled 2018-02-01: qty 4

## 2018-02-01 NOTE — Progress Notes (Signed)
PT Cancellation Note  Patient Details Name: Luis Palmer MRN: [id number] DOB: May 28, 1933   Cancelled Treatment:    Reason Eval/Treat Not Completed: Patient declined, no reason specified. Pt in bed with CPAP machine donned attempting to get some rest. Pt's wife present as well. Pt and wife report that pt ambulated around unit this morning with no issues. PT has attempted x2 today. PT will continue to f/u with pt acutely.   [place] 02/01/2018, 2:43 PM

## 2018-02-01 NOTE — Progress Notes (Signed)
[facility]  PROGRESS NOTE  Luis Palmer HTD:[id number] DOB: 02-Oct-1932 DOA: 01/25/2018 PCP: Idelle Crouch, MD   Brief HPI:   84 year old male with a history of ischemic cardiomyopathy, status post ICD, atrial fibrillation status post ablation x2, CAD with history of CABG, chronic systolic heart failure, chronic atrial fibrillation on anticoagulation was initially admitted to [facility] regional hospital day on 01/21/2018 with acute onset of bright red blood per rectum.  He was given Kcentra to reverse anticoagulation and transfuse PRBC.  He underwent colonoscopy showing colon mass in the ascending colon that was biopsied showing adenocarcinoma of the colon.    He was seen by general surgery and was recommended resection.  Cardiology saw the patient cleared for surgery.  Family wanted to be transferred to [facility] for surgery given extensive history of heart disease.  Patient underwent laparoscopic-assisted extended right colectomy, cystoscopy with insertion of Foley catheter.  Subjective   Patient seen and examined, started on clear liquid diet by general surgery.   Assessment/Plan:     1. Status post laparoscopic right colectomy-patient was diagnosed to have adenocarcinoma of ascending colon.  Underwent laparoscopic-assisted extended right colectomy.  Patient is currently n.p.o.  Has not passed flatus yet.  General surgery following.  2. Acute blood loss anemia-secondary to rectal bleeding with adenocarcinoma, hemoglobin is stable at 8.4.  Has low iron stores, iron level of 17, U IBC in 194, TIBC 211, saturation ratio 8%.  EGD showed gastritis.  Follow hemoglobin and transfuse for hemoglobin less than 7.  3. Ischemic cardiomyopathy-EF of 20%, continue beta-blocker, ARB on hold.  Continue twice daily Lasix.  Patient has V pacemaker, pacemaker interrogation as per cardiology.  4. Chronic atrial fibrillation-heart rate is controlled, continue Coreg 12.5 mg twice a day.  Coumadin  is on hold, INR was reversed.  Cardiology to recommend regarding restarting heparin as per note from Dr. Percival Spanish on 01/29/2018.  5. Chronic kidney disease stage III-creatinine stable, at baseline.  Today creatinine is 1.36.  Patient is on Lasix 40 mg IV every 12 hours.  ARB on hold as per cardiology recommendation.  6. Hypertension-blood pressure stable, ARB on hold, continue Coreg  7. Mild dementia-continue Aricept 10 mg p.o. Daily  8. CAD with history of CABG-continue Coreg, rosuvastatin  9. Diabetes mellitus type 2-blood glucose is elevated in 200 range, patient is on D5 half-normal saline.  Continue Lantus 5 units subcu daily.  Sliding scale insulin with NovoLog.  10. Hypomagnesemia-replete, today magnesium was 2.0      CBG: Recent Labs  Lab 01/31/18 1220 01/31/18 1600 01/31/18 2104 02/01/18 0824 02/01/18 1305  GLUCAP 234* 221* 207* 178* 209*    CBC: Recent Labs  Lab 01/28/18 0600 01/29/18 0444 01/30/18 0430 01/31/18 0350 02/01/18 0540  WBC 5.8 5.5 6.0 4.9 7.2  NEUTROABS 3.1 2.8 2.9 4.0  --   HGB 8.6* 8.5* 8.7* 8.4* 8.7*  HCT 28.2* 27.0* 27.5* 26.9* 27.6*  MCV 89.8 86.3 87.0 89.1 87.3  PLT 155 156 167 149* 726    Basic Metabolic Panel: Recent Labs  Lab 01/28/18 0600 01/29/18 0444 01/30/18 0430 01/31/18 0350 02/01/18 0540  NA 140 141 139 140 141  K 3.3* 3.6 4.4 4.7 4.2  CL 107 105 103 105 103  CO2 26 28 28 25 28   GLUCOSE 120* 120* 121* 263* 213*  BUN 15 13 11 12 14   CREATININE 1.40* 1.43* 1.34* 1.36* 1.33*  CALCIUM 8.7* 8.8* 8.9 8.5* 8.9  MG 1.5* 1.7 2.0 2.0  --  PHOS 3.7 3.3 2.7 3.8  --      DVT prophylaxis: SCDs  Code Status: Full code  Family Communication: No family at bedside  Disposition Plan: likely home when medically ready for discharge   Consultants:  General surgery  Cardiology  Procedures:  Laparoscopic assisted right colectomy   Antibiotics:   Anti-infectives (From admission, onward)   Start     Dose/Rate Route  Frequency Ordered Stop   01/30/18 1030  cefoTEtan (CEFOTAN) 2 g in sodium chloride 0.9 % 100 mL IVPB     2 g 200 mL/hr over 30 Minutes Intravenous To Surgery 01/30/18 1027 01/31/18 1608       Objective   Vitals:   01/31/18 2137 02/01/18 0605 02/01/18 0700 02/01/18 0846  BP:  (!) 131/58  (!) 130/59  Pulse: 68 (!) 59    Resp: 16 17    Temp:  97.7 F (36.5 C)    TempSrc:  Oral    SpO2: 98% 96%    Weight:   99.1 kg   Height:        Intake/Output Summary (Last 24 hours) at 02/01/2018 1636 Last data filed at 02/01/2018 1508 Gross per 24 hour  Intake 1785 ml  Output 1525 ml  Net 260 ml   Filed Weights   01/30/18 1500 01/31/18 0351 02/01/18 0700  Weight: 100.6 kg 103 kg 99.1 kg     Physical Examination:    Neck: Supple, no deformities, masses, or tenderness Lungs: Normal respiratory effort, bilateral clear to auscultation, no crackles or wheezes.  Heart: Regular rate and rhythm, S1 and S2 normal, no murmurs, rubs auscultated Abdomen: BS normoactive,soft,nondistended,non-tender to palpation,no organomegaly Extremities: No pretibial edema, no erythema, no cyanosis, no clubbing Neuro : Alert and oriented to time, place and person, No focal deficits      Data Reviewed: I have personally reviewed following labs and imaging studies   Recent Results (from the past 240 hour(s))  Surgical pcr screen     Status: None   Collection Time: 01/30/18  5:19 AM  Result Value Ref Range Status   MRSA, PCR NEGATIVE NEGATIVE Final   Staphylococcus aureus NEGATIVE NEGATIVE Final    Comment: (NOTE) The Xpert SA Assay (FDA approved for NASAL specimens in patients 44 years of age and older), is one component of a comprehensive surveillance program. It is not intended to diagnose infection nor to guide or monitor treatment. Performed at [facility], [place] [address], [place], [place] [address]      Liver Function Tests: Recent Labs  Lab 01/28/18 0600 01/29/18 0444  01/30/18 0430 01/31/18 0350 02/01/18 0540  AST 12* 11* 10* 10* 10*  ALT 19 16 14 12 11   ALKPHOS 57 57 57 54 51  BILITOT 1.2 1.2 1.1 1.1 1.0  PROT 5.6* 5.9* 5.7* 5.6* 5.8*  ALBUMIN 2.7* 2.6* 2.8* 2.8* 2.9*   No results for input(s): LIPASE, AMYLASE in the last 168 hours. No results for input(s): AMMONIA in the last 168 hours.  Cardiac Enzymes: No results for input(s): CKTOTAL, CKMB, CKMBINDEX, TROPONINI in the last 168 hours. BNP (last 3 results) Recent Labs    01/26/18 1051  BNP 1,290.3*    ProBNP (last 3 results) No results for input(s): PROBNP in the last 8760 hours.    Studies: Dg Chest Port 1 View  Result Date: 01/31/2018 CLINICAL DATA:  Shortness of breath EXAM: PORTABLE CHEST 1 VIEW COMPARISON:  January 25, 2018 FINDINGS: There is no edema or consolidation. There is  slight left base atelectasis. There is stable cardiomegaly. Pacemaker leads are attached to the right atrium, right ventricle, and coronary sinus. Patient is status post coronary artery bypass grafting. There is aortic atherosclerosis. No adenopathy. No evident bone lesions. IMPRESSION: Mild left base atelectasis. No edema or consolidation. Stable cardiomegaly. Stable pacemaker lead placements. There is aortic atherosclerosis. Aortic Atherosclerosis (ICD10-I70.0). Electronically Signed   By: Lowella Grip III M.D.   On: 01/31/2018 08:12    Scheduled Meds: . sodium chloride   Intravenous Once  . carvedilol  12.5 mg Oral BID WC  . cholecalciferol  2,000 Units Oral Daily  . donepezil  10 mg Oral QHS  . ezetimibe  10 mg Oral QHS  . [START ON 02/02/2018] furosemide  40 mg Intravenous Daily  . heparin injection (subcutaneous)  5,000 Units Subcutaneous Q8H  . insulin aspart  0-5 Units Subcutaneous QHS  . insulin aspart  0-9 Units Subcutaneous TID WC  . insulin glargine  5 Units Subcutaneous QHS  . losartan  25 mg Oral Daily  . pantoprazole  40 mg Oral BID  . polyethylene glycol  17 g Oral Daily  .  rosuvastatin  10 mg Oral Daily  . sodium chloride flush  3 mL Intravenous Q12H  . tamsulosin  0.4 mg Oral QHS      Time spent: 25 min  [facility] Pager [phone]. If 7PM-7AM, please contact night-coverage at [email or web address], Office  [phone]  password [email or web address]  02/01/2018, 4:36 PM  LOS: 7 days

## 2018-02-01 NOTE — Progress Notes (Signed)
Pt states he does not need assistance with CPAP tonight. RT checked water chamber, and advised pt/spouse to have respiratory called if any further assistance is required.

## 2018-02-01 NOTE — Progress Notes (Addendum)
Progress Note  Patient Name: Luis Palmer Date of Encounter: 02/01/2018  Primary Cardiologist: [facility]   Subjective   84 year old gentleman with a history of known chronic systolic congestive heart failure, MDT VIVA XT CRT-D, chronic atrial fibrillation, coronary artery disease-status post coronary artery bypass grafting.  He did was admitted 08/29 with GI bleed.  Colonoscopy showed a colon mass in the ascending colon.  He had a partial colectomy 09/03.  Pathology revealed adenocarcinoma.   09/05: POD 2  Abd sore from doing IS, willing to keep doing it. Wears CPAP at night, hx DOE, but has not been OOB much.  Has been started on clear liquids.  Wife says surgeons told them would restart anticoag very slowly.    Inpatient Medications    Scheduled Meds: . sodium chloride   Intravenous Once  . carvedilol  12.5 mg Oral BID WC  . cholecalciferol  2,000 Units Oral Daily  . donepezil  10 mg Oral QHS  . ezetimibe  10 mg Oral QHS  . furosemide  40 mg Intravenous BID  . heparin injection (subcutaneous)  5,000 Units Subcutaneous Q8H  . insulin aspart  0-5 Units Subcutaneous QHS  . insulin aspart  0-9 Units Subcutaneous TID WC  . insulin glargine  5 Units Subcutaneous QHS  . pantoprazole  40 mg Oral BID  . polyethylene glycol  17 g Oral Daily  . rosuvastatin  10 mg Oral Daily  . sodium chloride flush  3 mL Intravenous Q12H  . tamsulosin  0.4 mg Oral QHS   Continuous Infusions: . sodium chloride Stopped (01/29/18 1305)  . dextrose 5 % and 0.9 % NaCl with KCl 20 mEq/L 75 mL/hr at 02/01/18 0713  . lactated ringers Stopped (01/30/18 1336)   PRN Meds: sodium chloride, acetaminophen **OR** acetaminophen, morphine injection, ondansetron **OR** ondansetron (ZOFRAN) IV, sodium chloride flush   Vital Signs    Vitals:   01/31/18 2137 02/01/18 0605 02/01/18 0700 02/01/18 0846  BP:  (!) 131/58  (!) 130/59  Pulse: 68 (!) 59    Resp: 16 17    Temp:  97.7 F (36.5 C)      TempSrc:  Oral    SpO2: 98% 96%    Weight:   99.1 kg   Height:        Intake/Output Summary (Last 24 hours) at 02/01/2018 1209 Last data filed at 02/01/2018 2376 Gross per 24 hour  Intake 1422.45 ml  Output 1855 ml  Net -432.55 ml   Filed Weights   01/30/18 1500 01/31/18 0351 02/01/18 0700  Weight: 100.6 kg 103 kg 99.1 kg    Telemetry    Afib +/- V pacing - Personally Reviewed  ECG    Atrial fibrillation- Personally Reviewed  Physical Exam   General: Well developed, well nourished, male in no acute distress Head: Eyes PERRLA, No xanthomas.   Normocephalic and atraumatic Lungs: rales bases, decreased w/ repeated deep breaths. Heart: Irreg R&R S1 S2, without RG. 2/6 SEM.  Pulses are 2+ & equal. No carotid bruit. No JVD. Abdomen: Bowel sounds are present, abdomen soft and tender without masses or  hernias noted. Msk: Normal strength and tone for age. Extremities: No clubbing, cyanosis or edema.    Skin:  No rashes or lesions noted. Neuro: Alert and oriented X 3. Psych:  Good affect, responds appropriately   Labs    Chemistry Recent Labs  Lab 01/30/18 0430 01/31/18 0350 02/01/18 0540  NA 139 140 141  K 4.4 4.7 4.2  CL 103 105 103  CO2 28 25 28   GLUCOSE 121* 263* 213*  BUN 11 12 14   CREATININE 1.34* 1.36* 1.33*  CALCIUM 8.9 8.5* 8.9  PROT 5.7* 5.6* 5.8*  ALBUMIN 2.8* 2.8* 2.9*  AST 10* 10* 10*  ALT 14 12 11   ALKPHOS 57 54 51  BILITOT 1.1 1.1 1.0  GFRNONAA 47* 46* 48*  GFRAA 55* 54* 55*  ANIONGAP 8 10 10      Hematology Recent Labs  Lab 01/30/18 0430 01/31/18 0350 02/01/18 0540  WBC 6.0 4.9 7.2  RBC 3.16* 3.02* 3.16*  HGB 8.7* 8.4* 8.7*  HCT 27.5* 26.9* 27.6*  MCV 87.0 89.1 87.3  MCH 27.5 27.8 27.5  MCHC 31.6 31.2 31.5  RDW 16.6* 16.4* 16.6*  PLT 167 149* 155     BNP Recent Labs  Lab 01/26/18 1051  BNP 1,290.3*     Radiology    Dg Chest Port 1 View  Result Date: 01/31/2018 CLINICAL DATA:  Shortness of breath EXAM: PORTABLE CHEST 1  VIEW COMPARISON:  January 25, 2018 FINDINGS: There is no edema or consolidation. There is slight left base atelectasis. There is stable cardiomegaly. Pacemaker leads are attached to the right atrium, right ventricle, and coronary sinus. Patient is status post coronary artery bypass grafting. There is aortic atherosclerosis. No adenopathy. No evident bone lesions. IMPRESSION: Mild left base atelectasis. No edema or consolidation. Stable cardiomegaly. Stable pacemaker lead placements. There is aortic atherosclerosis. Aortic Atherosclerosis (ICD10-I70.0). Electronically Signed   By: Lowella Grip III M.D.   On: 01/31/2018 08:12    Cardiac Studies   None  Patient Profile     84 y.o. male with a recent diagnosis of adenocarcinoma of the colon.  He has chronic atrial fibrillation, chronic systolic congestive heart failure  Assessment & Plan    1.  Atrial fibrillation:  - chronic, CRT-D paces most of the time - HR ok. No tachycardia  2. Chronic anticoagulation - discuss w/ MD if we start Lovenox or heparin, or just start coumadin and allow INR to drift up.  3.  Chronic systolic congestive heart failure:  - wt stable, trending down and no sig volume overload by exam. - chronic DOE, hopefully can start to increase activity.  - has ATX by CXR and exam, continue IS   For questions or updates, please contact [facility] Please consult [email or web address] for contact info under Cardiology/STEMI.      Signed, Rosaria Ferries, PA-C  02/01/2018, 12:09 PM  ' As above, patient seen and examined.  He has mild abdominal soreness but denies chest pain or dyspnea.  Would like to begin Coumadin when okay with surgery.  I will not add heparin given risk of bleeding postop.  We will continue present cardiac medications but resume Cozaar 25 mg daily and decrease Lasix to 40 mg daily.  Decrease IV fluids to [facility] when it is clear he is tolerating p.o. Intake. Kirk Ruths, MD

## 2018-02-01 NOTE — Care Management Important Message (Signed)
Important Message  Patient Details  Name: Luis Palmer MRN: [id number] Date of Birth: 1932-09-18   Medicare Important Message Given:  Yes    Lido Maske 02/01/2018, 2:30 PM

## 2018-02-01 NOTE — Progress Notes (Signed)
2 Days Post-Op   Subjective/Chief Complaint: No complaints   Objective: Vital signs in last 24 hours: Temp:  [97.3 F (36.3 C)-97.7 F (36.5 C)] 97.7 F (36.5 C) (09/05 0605) Pulse Rate:  [58-68] 59 (09/05 0605) Resp:  [11-17] 17 (09/05 0605) BP: (96-131)/(52-77) 131/58 (09/05 0605) SpO2:  [96 %-100 %] 96 % (09/05 0605) Last BM Date: 01/28/18  Intake/Output from previous day: 09/04 0701 - 09/05 0700 In: 1797.6 [P.O.:50; I.V.:1747.6] Out: 2790 [Urine:2720; Drains:70] Intake/Output this shift: Total I/O In: -  Out: 35 [Drains:35]  General appearance: alert and cooperative Resp: clear to auscultation bilaterally Cardio: regular rate and rhythm GI: soft, minimal tenderness. good bs. incision looks good  Lab Results:  Recent Labs    01/31/18 0350 02/01/18 0540  WBC 4.9 7.2  HGB 8.4* 8.7*  HCT 26.9* 27.6*  PLT 149* 155   BMET Recent Labs    01/31/18 0350 02/01/18 0540  NA 140 141  K 4.7 4.2  CL 105 103  CO2 25 28  GLUCOSE 263* 213*  BUN 12 14  CREATININE 1.36* 1.33*  CALCIUM 8.5* 8.9   PT/INR No results for input(s): LABPROT, INR in the last 72 hours. ABG No results for input(s): PHART, HCO3 in the last 72 hours.  Invalid input(s): PCO2, PO2  Studies/Results: Dg Chest Port 1 View  Result Date: 01/31/2018 CLINICAL DATA:  Shortness of breath EXAM: PORTABLE CHEST 1 VIEW COMPARISON:  January 25, 2018 FINDINGS: There is no edema or consolidation. There is slight left base atelectasis. There is stable cardiomegaly. Pacemaker leads are attached to the right atrium, right ventricle, and coronary sinus. Patient is status post coronary artery bypass grafting. There is aortic atherosclerosis. No adenopathy. No evident bone lesions. IMPRESSION: Mild left base atelectasis. No edema or consolidation. Stable cardiomegaly. Stable pacemaker lead placements. There is aortic atherosclerosis. Aortic Atherosclerosis (ICD10-I70.0). Electronically Signed   By: Lowella Grip III  M.D.   On: 01/31/2018 08:12    Anti-infectives: Anti-infectives (From admission, onward)   Start     Dose/Rate Route Frequency Ordered Stop   01/30/18 1030  cefoTEtan (CEFOTAN) 2 g in sodium chloride 0.9 % 100 mL IVPB     2 g 200 mL/hr over 30 Minutes Intravenous To Surgery 01/30/18 1027 01/31/18 1608      Assessment/Plan: s/p Procedure(s): LAPAROSCOPIC ASSISTED EXTENDED RIGHT COLECTOMY (N/A) CYSTOSCOPY FLEXIBLE WITH INSERTION OF CATHETER (N/A) Advance diet. Start clears Ambulate Hg stable POD 2  LOS: 7 days    TOTH III,Marybella Ethier S 02/01/2018

## 2018-02-01 NOTE — Progress Notes (Addendum)
Initial Nutrition Assessment  DOCUMENTATION CODES:   Not applicable  INTERVENTION:   -Boost Breeze po TID, each supplement provides 250 kcal and 9 grams of protein -MVI with minerals daily -If prolonged NPO/Clear liquid diet is anticipated, consider initiation of nutrition support  NUTRITION DIAGNOSIS:   Increased nutrient needs related to post-op healing as evidenced by estimated needs.  GOAL:   Patient will meet greater than or equal to 90% of their needs  MONITOR:   PO intake, Supplement acceptance, Diet advancement, Labs, Weight trends, Skin, I & O's  REASON FOR ASSESSMENT:   NPO/Clear Liquid Diet    ASSESSMENT:   Luis Palmer is a 84 y.o. male w/ hx of severe ICM sp ICD, afib ablation x 2, CAD sp CABG, chron systolic CHF, chron afib admitted to [facility] on 01/21/18 w/ BRBPR of acute onset.  Pt admitted with GIB and colon biopsy positive for adenocarcinoma.   9/3- s/p lap assisted extended rt colectomy and cystoscopy flexible with insertion of catheter  Pt advanced to clear liquid diet today. No intake documented to assess.   Attempted to see pt x 2, however, either receiving nursing care or in with department director at times of visits.   Pt has experienced a7.8% wt loss over the past 4 months.   Last Hgb A1c: 6.7 (07/28/13). PTA DM medications are 500 mg metformin BID.   Labs reviewed: CBGS: 174-209 (inpatient orders for glycemic control are 0-5 units insulin aspart q HS, 0-9 units insulin aspart TID with meals, 5 units insulin glargine daily).   Diet Order:   Diet Order            Diet clear liquid Room service appropriate? Yes; Fluid consistency: Thin  Diet effective now              EDUCATION NEEDS:   No education needs have been identified at this time  Skin:  Skin Assessment: Skin Integrity Issues: Skin Integrity Issues:: Incisions Incisions: closed abdominal incision  Last BM:  01/28/18  Height:   Ht Readings from Last 1  Encounters:  01/30/18 6' (1.829 m)    Weight:   Wt Readings from Last 1 Encounters:  02/01/18 99.1 kg    Ideal Body Weight:  80.9 kg  BMI:  Body mass index is 29.63 kg/m.  Estimated Nutritional Needs:   Kcal:  2200-2400  Protein:  105-120 grams  Fluid:  >2.2 L    Luis Palmer A. Jimmye Norman, RD, LDN, CDE Pager: [phone] After hours Pager: [phone]

## 2018-02-02 LAB — GLUCOSE, CAPILLARY
GLUCOSE-CAPILLARY: 202 mg/dL — AB (ref 70–99)
GLUCOSE-CAPILLARY: 203 mg/dL — AB (ref 70–99)
Glucose-Capillary: 180 mg/dL — ABNORMAL HIGH (ref 70–99)
Glucose-Capillary: 176 mg/dL — ABNORMAL HIGH (ref 70–99)

## 2018-02-02 LAB — BASIC METABOLIC PANEL
ANION GAP: 7 (ref 5–15)
BUN: 13 mg/dL (ref 8–23)
CHLORIDE: 102 mmol/L (ref 98–111)
CO2: 30 mmol/L (ref 22–32)
Calcium: 9 mg/dL (ref 8.9–10.3)
Creatinine, Ser: 1.3 mg/dL — ABNORMAL HIGH (ref 0.61–1.24)
GFR calc non Af Amer: 49 mL/min — ABNORMAL LOW (ref >60)
GFR, EST AFRICAN AMERICAN: 57 mL/min — AB (ref >60)
Glucose, Bld: 196 mg/dL — ABNORMAL HIGH (ref 70–99)
POTASSIUM: 4.5 mmol/L (ref 3.5–5.1)
Sodium: 139 mmol/L (ref 135–145)

## 2018-02-02 LAB — CBC
HCT: 28.1 % — ABNORMAL LOW (ref 39.0–52.0)
HEMOGLOBIN: 8.8 g/dL — AB (ref 13.0–17.0)
MCH: 27.5 pg (ref 26.0–34.0)
MCHC: 31.3 g/dL (ref 30.0–36.0)
MCV: 87.8 fL (ref 78.0–100.0)
Platelets: 162 10*3/uL (ref 150–400)
RBC: 3.2 MIL/uL — ABNORMAL LOW (ref 4.22–5.81)
RDW: 16.4 % — ABNORMAL HIGH (ref 11.5–15.5)
WBC: 6.6 10*3/uL (ref 4.0–10.5)

## 2018-02-02 LAB — HEPARIN LEVEL (UNFRACTIONATED): HEPARIN UNFRACTIONATED: 0.7 [IU]/mL (ref 0.30–0.70)

## 2018-02-02 MED ORDER — OXYCODONE HCL 5 MG PO TABS
5.0000 mg | ORAL_TABLET | Freq: Four times a day (QID) | ORAL | Status: DC | PRN
  Filled 2018-02-02: qty 1

## 2018-02-02 MED ORDER — HEPARIN (PORCINE) IN NACL 100-0.45 UNIT/ML-% IJ SOLN
900.0000 [IU]/h | INTRAMUSCULAR | Status: DC
  Administered 2018-02-02: 1400 [IU]/h via INTRAVENOUS
  Administered 2018-02-03: 1250 [IU]/h via INTRAVENOUS
  Administered 2018-02-04: 900 [IU]/h via INTRAVENOUS
  Filled 2018-02-02 (×3): qty 250

## 2018-02-02 MED ORDER — FUROSEMIDE 40 MG PO TABS
40.0000 mg | ORAL_TABLET | Freq: Every day | ORAL | Status: DC
  Administered 2018-02-03: 40 mg via ORAL
  Filled 2018-02-02: qty 1

## 2018-02-02 MED ORDER — METHOCARBAMOL 500 MG PO TABS
500.0000 mg | ORAL_TABLET | Freq: Three times a day (TID) | ORAL | Status: DC | PRN
  Administered 2018-02-02 – 2018-02-04 (×2): 500 mg via ORAL
  Filled 2018-02-02 (×2): qty 1

## 2018-02-02 NOTE — Progress Notes (Signed)
ANTICOAGULATION CONSULT NOTE - Initial Consult  Pharmacy Consult for heparin Indication: atrial fibrillation  Allergies  Allergen Reactions  . Pseudoephedrine Hcl Other (See Comments)    Other Reaction: ANURIA  . Ace Inhibitors Cough and Other (See Comments)    cough cough   . Nsaids Other (See Comments)    Reaction:  Fluid retention   . Vasotec [Enalapril] Cough    Patient Measurements: Height: 6' (182.9 cm) Weight: 222 lb 7.1 oz (100.9 kg) IBW/kg (Calculated) : 77.6 Heparin Dosing Weight: 98 kg  Vital Signs: Temp: 98 F (36.7 C) (09/06 0620) Temp Source: Oral (09/06 0620) BP: 125/54 (09/06 0620) Pulse Rate: 60 (09/06 0620)  Labs: Recent Labs    01/31/18 0350 02/01/18 0540 02/02/18 0446  HGB 8.4* 8.7* 8.8*  HCT 26.9* 27.6* 28.1*  PLT 149* 155 162  CREATININE 1.36* 1.33* 1.30*    Estimated Creatinine Clearance: 52.9 mL/min (A) (by C-G formula based on SCr of 1.3 mg/dL (H)).   Medical History: Past Medical History:  Diagnosis Date  . AICD (automatic cardioverter/defibrillator) present 2006  . Anemia   . Atrial fibrillation ([place])   . CHF (congestive heart failure) ([place])   . CKD (chronic kidney disease), stage III ([place])   . Colon cancer ([place])    "dx'd 01/24/2018"  . High cholesterol   . History of blood transfusion 01/22/2018   "I lost blood; HgB extremely low"  . History of gout   . Myocardial infarction ([place]) 1986  . OSA on CPAP   . Pneumonia 2015  . Type II diabetes mellitus ([facility])     Medications:  Medications Prior to Admission  Medication Sig Dispense Refill Last Dose  . betamethasone dipropionate 0.05 % lotion Apply 1 application topically daily as needed for itching or dry skin.   unk  . carvedilol (COREG) 12.5 MG tablet Take 12.5 mg by mouth 2 (two) times daily.   Past Week at Unknown time  . Cholecalciferol (VITAMIN D) 2000 UNITS CAPS Take 1 capsule by mouth daily.   Past Week at Unknown time  . donepezil (ARICEPT) 10 MG tablet Take 10 mg  by mouth at bedtime.   Past Week at Unknown time  . ezetimibe (ZETIA) 10 MG tablet Take 10 mg by mouth at bedtime.   Past Week at Unknown time  . furosemide (LASIX) 40 MG tablet Take 40 mg by mouth daily.   Past Week at Unknown time  . Iodoquinol-HC-Aloe Polysacch (ALCORTIN A) 1-2-1 % GEL Apply 1 application topically as needed for dry skin.  2 unk  . losartan (COZAAR) 25 MG tablet Take 25 mg by mouth daily.   Past Week at Unknown time  . meclizine (ANTIVERT) 25 MG tablet Take 25 mg by mouth daily.    Past Week at Unknown time  . pantoprazole (PROTONIX) 40 MG tablet Take 1 tablet (40 mg total) by mouth 2 (two) times daily.   01/25/2018  . rosuvastatin (CRESTOR) 10 MG tablet Take 10 mg by mouth daily.   Past Week at Unknown time    Assessment: 84 y/o male on warfarin PTA for Afib which is on hold s/p R hemicolectomy due to colon tumor on 9/3. Pharmacy consulted to begin heparin drip with no bolus; plan is to resume warfarin once tolerating soft diet. No bleeding noted, Hgb low stable, platelets are low normal.  Goal of Therapy:  Heparin level 0.3-0.5 units/ml with recent GI bleed Monitor platelets by anticoagulation protocol: Yes   Plan:  Discontinue SQ heparin  Begin heparin drip at 1400 units/hr with no bolus 8 hr heparin level Daily heparin level and CBC Monitor for s/sx of bleeding   Renold Genta, PharmD, BCPS Clinical Pharmacist Clinical phone for 02/02/2018 until 3p is [phone] Please check AMION for all Pharmacist numbers by unit 02/02/2018 11:15 AM

## 2018-02-02 NOTE — Progress Notes (Signed)
[place] for heparin Indication: atrial fibrillation  Allergies  Allergen Reactions  . Pseudoephedrine Hcl Other (See Comments)    Other Reaction: ANURIA  . Ace Inhibitors Cough and Other (See Comments)    cough cough   . Nsaids Other (See Comments)    Reaction:  Fluid retention   . Vasotec [Enalapril] Cough    Patient Measurements: Height: 6' (182.9 cm) Weight: 222 lb 7.1 oz (100.9 kg) IBW/kg (Calculated) : 77.6 Heparin Dosing Weight: 98 kg  Vital Signs: Temp: 97.8 F (36.6 C) (09/06 1709) Temp Source: Oral (09/06 1709) BP: 125/50 (09/06 1836) Pulse Rate: 60 (09/06 1836)  Labs: Recent Labs    01/31/18 0350 02/01/18 0540 02/02/18 0446 02/02/18 2015  HGB 8.4* 8.7* 8.8*  --   HCT 26.9* 27.6* 28.1*  --   PLT 149* 155 162  --   HEPARINUNFRC  --   --   --  0.70  CREATININE 1.36* 1.33* 1.30*  --     Estimated Creatinine Clearance: 52.9 mL/min (A) (by C-G formula based on SCr of 1.3 mg/dL (H)).   Assessment: 84 y/o male on warfarin PTA for Afib which is on hold s/p R hemicolectomy due to colon tumor on 9/3. Pharmacy consulted to begin heparin drip with no bolus; plan is to resume warfarin once tolerating soft diet. No bleeding noted, Hgb low stable, platelets are low normal.  Initial heparin level = 0.7  Goal of Therapy:  Heparin level 0.3-0.5 units/ml with recent GI bleed Monitor platelets by anticoagulation protocol: Yes   Plan:  Decrease heparin to 1250 units / hr Daily heparin level and CBC Monitor for s/sx of bleeding  Thank you Anette Guarneri, PharmD Please check AMION for all Pharmacist numbers by unit 02/02/2018 9:17 PM

## 2018-02-02 NOTE — Progress Notes (Signed)
[facility] Progress Note  3 Days Post-Op  Subjective: CC: soreness in lower abdomen Patient having some soreness in lower abdomen but states incision is not tender. Tolerated CLD and having bowel function. Mobilizing well. Patient was on coumadin prior to admission, will transition back to this once tolerating solid foods.   Objective: Vital signs in last 24 hours: Temp:  [97.9 F (36.6 C)-98 F (36.7 C)] 98 F (36.7 C) (09/06 0620) Pulse Rate:  [59-60] 60 (09/06 0620) Resp:  [20] 20 (09/06 0620) BP: (109-125)/(35-55) 125/54 (09/06 0620) SpO2:  [96 %-98 %] 96 % (09/06 0620) Weight:  [100.9 kg] 100.9 kg (09/06 0620) Last BM Date: 02/02/18  Intake/Output from previous day: 09/05 0701 - 09/06 0700 In: 2658 [P.O.:840; I.V.:1818] Out: 3025 [Urine:2300; Drains:475; Stool:250] Intake/Output this shift: Total I/O In: -  Out: 80 [Drains:80]  PE: Gen:  Alert, NAD, pleasant Card:  Regular rate and rhythm, pedal pulses 2+ BL Pulm:  Normal effort, clear to auscultation bilaterally Abd: Soft, non-tender, non-distended, bowel sounds present, no HSM, incisions C/D/I with staples present; drain present in upper abdomen with serosanguinous output Skin: warm and dry, no rashes  Psych: A&Ox3   Lab Results:  Recent Labs    02/01/18 0540 02/02/18 0446  WBC 7.2 6.6  HGB 8.7* 8.8*  HCT 27.6* 28.1*  PLT 155 162   BMET Recent Labs    02/01/18 0540 02/02/18 0446  NA 141 139  K 4.2 4.5  CL 103 102  CO2 28 30  GLUCOSE 213* 196*  BUN 14 13  CREATININE 1.33* 1.30*  CALCIUM 8.9 9.0   PT/INR No results for input(s): LABPROT, INR in the last 72 hours. CMP     Component Value Date/Time   NA 139 02/02/2018 0446   NA 133 (L) 08/03/2013 0455   K 4.5 02/02/2018 0446   K 4.7 08/03/2013 0455   CL 102 02/02/2018 0446   CL 101 08/03/2013 0455   CO2 30 02/02/2018 0446   CO2 27 08/03/2013 0455   GLUCOSE 196 (H) 02/02/2018 0446   GLUCOSE 151 (H) 08/03/2013 0455   BUN 13  02/02/2018 0446   BUN 16 08/03/2013 0455   CREATININE 1.30 (H) 02/02/2018 0446   CREATININE 1.13 08/03/2013 0455   CALCIUM 9.0 02/02/2018 0446   CALCIUM 9.2 08/03/2013 0455   PROT 5.8 (L) 02/01/2018 0540   PROT 6.6 07/31/2013 0437   ALBUMIN 2.9 (L) 02/01/2018 0540   ALBUMIN 2.6 (L) 07/31/2013 0437   AST 10 (L) 02/01/2018 0540   AST 20 07/31/2013 0437   ALT 11 02/01/2018 0540   ALT 27 07/31/2013 0437   ALKPHOS 51 02/01/2018 0540   ALKPHOS 86 07/31/2013 0437   BILITOT 1.0 02/01/2018 0540   BILITOT 1.2 (H) 07/31/2013 0437   GFRNONAA 49 (L) 02/02/2018 0446   GFRNONAA >60 08/03/2013 0455   GFRAA 57 (L) 02/02/2018 0446   GFRAA >60 08/03/2013 0455   Lipase     Component Value Date/Time   LIPASE 47 01/25/2018 0508       Studies/Results: No results found.  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 Marlou Starks - POD#3 - drain with 475 out yesterday, increased from previously - tolerating CLD - advance today to FLD and to soft tomorrow if tolerating - restart heparin gtt and work on transitioning back to coumadin once tolerating soft diet - continue to mobilize - d/c foley today for voiding trial - d/c mophine, added prn robaxin, can have PO oxy ir for severe pain if needed  FEN: FLD, decreased IVF VTE: SCDs, SQ heparin - need to get back on heparin gtt and then transition to coumadin  ID: [email or web address]  periop  LOS: 8 days    Brigid Re , [facility] 02/02/2018, 9:29 AM Pager: [phone] Consults: [phone] Mon-Fri 7:00 am-4:30 pm Sat-Sun 7:00 am-11:30 am

## 2018-02-02 NOTE — Evaluation (Signed)
Physical Therapy Evaluation Patient Details Name: Luis Palmer MRN: [id number] DOB: 07/09/1932 Today's Date: 02/02/2018   History of Present Illness  Pt is an 84 y/o male admitted to [facility] on 01/21/18 w/ BRBPR of acute onset and transferred to [facility] for GIB. 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 Marlou Starks. PMH including but not limited to DM, severe ICM sp ICD, afib ablation x 2, CAD sp CABG, chron systolic CHF and chronic a-fib.    Clinical Impression  Pt presented sitting EOB, awake and willing to participate in therapy session. Pt's spouse present throughout session as well. Prior to admission, pt reported that he was independent with all functional mobility and ADLs. Pt stated that he was previously ambulating a mile a day when able. Pt lives with his spouse in a single level home with a level entry. He is currently able to perform bed mobility with supervision, transfers with supervision and ambulate in hallway with RW and min guard for safety. PT encouraged pt to continue to ambulate throughout the day with assistance. Pt and pt's wife both agreeable to plan. Pt would continue to benefit from skilled physical therapy services at this time while admitted and after d/c to address the below listed limitations in order to improve overall safety and independence with functional mobility.      Follow Up Recommendations Home health PT;Supervision - Intermittent    Equipment Recommendations  Rolling walker with 5" wheels    Recommendations for Other Services       Precautions / Restrictions Precautions Precautions: Fall Precaution Comments: L abdominal JP drain Restrictions Weight Bearing Restrictions: No      Mobility  Bed Mobility Overal bed mobility: Needs Assistance Bed Mobility: Supine to Sit     Supine to sit: Supervision     General bed mobility comments: increased time, supervision for  safety  Transfers Overall transfer level: Needs assistance Equipment used: Rolling walker (2 wheeled) Transfers: Sit to/from Stand Sit to Stand: Supervision         General transfer comment: increased time, bed in elevated position, supervision for safety  Ambulation/Gait Ambulation/Gait assistance: Min guard Gait Distance (Feet): 200 Feet Assistive device: Rolling walker (2 wheeled) Gait Pattern/deviations: Step-through pattern;Decreased stride length;Drifts right/left Gait velocity: decreased Gait velocity interpretation: 1.31 - 2.62 ft/sec, indicative of limited community ambulator General Gait Details: pt with mild instability but no overt LOB or need for physical assistance, min guard for safety; pt with slight difficulty navigating around obstacles in hallway (bumping into objects on R side)  Stairs            Wheelchair Mobility    Modified Rankin (Stroke Patients Only)       Balance Overall balance assessment: Needs assistance Sitting-balance support: Feet supported Sitting balance-Leahy Scale: Good     Standing balance support: During functional activity;Single extremity supported;Bilateral upper extremity supported Standing balance-Leahy Scale: Poor                               Pertinent Vitals/Pain Pain Assessment: Faces Faces Pain Scale: Hurts little more Pain Location: abdomen Pain Descriptors / Indicators: Sore Pain Intervention(s): Monitored during session    Home Living Family/patient expects to be discharged to:: Private residence Living Arrangements: Spouse/significant other Available Help at Discharge: Family Type of Home: House Home Access: Level entry     Home Layout: One level Home Equipment: Multimedia programmer  seat      Prior Function Level of Independence: Independent         Comments: pt reported that he usually walks a mile a day     Hand Dominance        Extremity/Trunk Assessment   Upper Extremity  Assessment Upper Extremity Assessment: Overall WFL for tasks assessed    Lower Extremity Assessment Lower Extremity Assessment: Generalized weakness    Cervical / Trunk Assessment Cervical / Trunk Assessment: Kyphotic  Communication   Communication: No difficulties  Cognition Arousal/Alertness: Awake/alert Behavior During Therapy: WFL for tasks assessed/performed Overall Cognitive Status: Within Functional Limits for tasks assessed                                        General Comments      Exercises     Assessment/Plan    PT Assessment Patient needs continued PT services  PT Problem List Decreased strength;Decreased balance;Decreased mobility;Decreased coordination;Decreased knowledge of use of DME;Decreased safety awareness;Decreased knowledge of precautions;Pain       PT Treatment Interventions DME instruction;Gait training;Stair training;Functional mobility training;Therapeutic activities;Therapeutic exercise;Balance training;Neuromuscular re-education;Patient/family education    PT Goals (Current goals can be found in the Care Plan section)  Acute Rehab PT Goals Patient Stated Goal: return to independence PT Goal Formulation: With patient/family Time For Goal Achievement: 02/16/18 Potential to Achieve Goals: Good    Frequency Min 3X/week   Barriers to discharge        Co-evaluation               AM-PAC PT "6 Clicks" Daily Activity  Outcome Measure Difficulty turning over in bed (including adjusting bedclothes, sheets and blankets)?: None Difficulty moving from lying on back to sitting on the side of the bed? : None Difficulty sitting down on and standing up from a chair with arms (e.g., wheelchair, bedside commode, etc,.)?: Unable Help needed moving to and from a bed to chair (including a wheelchair)?: None Help needed walking in hospital room?: A Little Help needed climbing 3-5 steps with a railing? : A Little 6 Click Score: 19     End of Session Equipment Utilized During Treatment: Gait belt Activity Tolerance: Patient tolerated treatment well Patient left: in bed;with call bell/phone within reach;with family/visitor present Nurse Communication: Mobility status PT Visit Diagnosis: Other abnormalities of gait and mobility (R26.89)    Time: 1014-1030 PT Time Calculation (min) (ACUTE ONLY): 16 min   Charges:   PT Evaluation $PT Eval Moderate Complexity: [place], PT, DPT  Acute Rehabilitation Services Pager [phone] Office [place] 02/02/2018, 10:51 AM

## 2018-02-02 NOTE — Progress Notes (Signed)
[facility]  PROGRESS NOTE  Luis Palmer EHU:[id number] DOB: 17-Oct-1932 DOA: 01/25/2018 PCP: Idelle Crouch, MD   Brief HPI:   84 year old male with a history of ischemic cardiomyopathy, status post ICD, atrial fibrillation status post ablation x2, CAD with history of CABG, chronic systolic heart failure, chronic atrial fibrillation on anticoagulation was initially admitted to [facility] regional hospital day on 01/21/2018 with acute onset of bright red blood per rectum.  He was given Kcentra to reverse anticoagulation and transfuse PRBC.  He underwent colonoscopy showing colon mass in the ascending colon that was biopsied showing adenocarcinoma of the colon.    He was seen by general surgery and was recommended resection.  Cardiology saw the patient cleared for surgery.  Family wanted to be transferred to [facility] for surgery given extensive history of heart disease.  Patient underwent laparoscopic-assisted extended right colectomy, cystoscopy with insertion of Foley catheter.  Subjective   Patient seen and examined, diet advanced to full liquid.   Assessment/Plan:     1. Status post laparoscopic right colectomy-patient was diagnosed to have adenocarcinoma of ascending colon.  Underwent laparoscopic-assisted extended right colectomy.  Patient diet has been advanced to full liquid diet.  2. Acute blood loss anemia-secondary to rectal bleeding with adenocarcinoma, hemoglobin is stable at 8.4.  Has low iron stores, iron level of 17, U IBC in 194, TIBC 211, saturation ratio 8%.  EGD showed gastritis.  Follow hemoglobin and transfuse for hemoglobin less than 7.  3. Ischemic cardiomyopathy-EF of 20%, continue beta-blocker, ARB on hold.  Continue twice daily Lasix.  Patient has V pacemaker, pacemaker interrogation as per cardiology.  4. Chronic atrial fibrillation-heart rate is controlled, continue Coreg 12.5 mg twice a day.  Coumadin is on hold, INR was reversed.  General surgery has  restarted heparin.  Will also restart Coumadin once patient is able to take soft diet.  Cardiology following.  5. Chronic kidney disease stage III-creatinine stable, at baseline.  Today creatinine is 1.36.  Patient is on Lasix 40 mg IV every 12 hours.  ARB on hold as per cardiology recommendation.  6. Hypertension-blood pressure stable, ARB on hold, continue Coreg  7. Mild dementia-continue Aricept 10 mg p.o. Daily  8. CAD with history of CABG-continue Coreg, rosuvastatin  9. Diabetes mellitus type 2-blood glucose is elevated in 200 range, patient is on D5 half-normal saline.  Continue Lantus 5 units subcu daily.  Sliding scale insulin with NovoLog.  10. Hypomagnesemia-replete, today magnesium was 2.0      CBG: Recent Labs  Lab 02/01/18 1305 02/01/18 1730 02/01/18 2048 02/02/18 0725 02/02/18 1211  GLUCAP 209* 174* 173* 180* 176*    CBC: Recent Labs  Lab 01/28/18 0600 01/29/18 0444 01/30/18 0430 01/31/18 0350 02/01/18 0540 02/02/18 0446  WBC 5.8 5.5 6.0 4.9 7.2 6.6  NEUTROABS 3.1 2.8 2.9 4.0  --   --   HGB 8.6* 8.5* 8.7* 8.4* 8.7* 8.8*  HCT 28.2* 27.0* 27.5* 26.9* 27.6* 28.1*  MCV 89.8 86.3 87.0 89.1 87.3 87.8  PLT 155 156 167 149* 155 785    Basic Metabolic Panel: Recent Labs  Lab 01/28/18 0600 01/29/18 0444 01/30/18 0430 01/31/18 0350 02/01/18 0540 02/02/18 0446  NA 140 141 139 140 141 139  K 3.3* 3.6 4.4 4.7 4.2 4.5  CL 107 105 103 105 103 102  CO2 26 28 28 25 28 30   GLUCOSE 120* 120* 121* 263* 213* 196*  BUN 15 13 11 12 14 13   CREATININE 1.40* 1.43* 1.34*  1.36* 1.33* 1.30*  CALCIUM 8.7* 8.8* 8.9 8.5* 8.9 9.0  MG 1.5* 1.7 2.0 2.0  --   --   PHOS 3.7 3.3 2.7 3.8  --   --      DVT prophylaxis: SCDs  Code Status: Full code  Family Communication: No family at bedside  Disposition Plan: likely home when medically ready for discharge   Consultants:  General surgery  Cardiology  Procedures:  Laparoscopic assisted right  colectomy   Antibiotics:   Anti-infectives (From admission, onward)   Start     Dose/Rate Route Frequency Ordered Stop   01/30/18 1030  cefoTEtan (CEFOTAN) 2 g in sodium chloride 0.9 % 100 mL IVPB     2 g 200 mL/hr over 30 Minutes Intravenous To Surgery 01/30/18 1027 01/31/18 1608       Objective   Vitals:   02/01/18 0846 02/01/18 1733 02/01/18 2047 02/02/18 0620  BP: (!) 130/59 (!) 118/55 (!) 109/35 (!) 125/54  Pulse:  60 (!) 59 60  Resp:  20  20  Temp:  98 F (36.7 C) 97.9 F (36.6 C) 98 F (36.7 C)  TempSrc:  Oral Oral Oral  SpO2:  97% 98% 96%  Weight:    100.9 kg  Height:        Intake/Output Summary (Last 24 hours) at 02/02/2018 1434 Last data filed at 02/02/2018 1308 Gross per 24 hour  Intake 2868 ml  Output 3430 ml  Net -562 ml   Filed Weights   01/31/18 0351 02/01/18 0700 02/02/18 0620  Weight: 103 kg 99.1 kg 100.9 kg     Physical Examination:     Mouth: Oral mucosa is moist, no lesions on palate,  Neck: Supple, no deformities, masses, or tenderness Lungs: Normal respiratory effort, bilateral clear to auscultation, no crackles or wheezes.  Heart: Regular rate and rhythm, S1 and S2 normal, no murmurs, rubs auscultated Abdomen: BS normoactive,soft,nondistended,non-tender to palpation,no organomegaly Extremities: No pretibial edema, no erythema, no cyanosis, no clubbing Neuro : Alert and oriented to time, place and person, No focal deficits       Data Reviewed: I have personally reviewed following labs and imaging studies   Recent Results (from the past 240 hour(s))  Surgical pcr screen     Status: None   Collection Time: 01/30/18  5:19 AM  Result Value Ref Range Status   MRSA, PCR NEGATIVE NEGATIVE Final   Staphylococcus aureus NEGATIVE NEGATIVE Final    Comment: (NOTE) The Xpert SA Assay (FDA approved for NASAL specimens in patients 12 years of age and older), is one component of a comprehensive surveillance program. It is not intended to  diagnose infection nor to guide or monitor treatment. Performed at [facility], [place] [address] [place], [place] [address]      Liver Function Tests: Recent Labs  Lab 01/28/18 0600 01/29/18 0444 01/30/18 0430 01/31/18 0350 02/01/18 0540  AST 12* 11* 10* 10* 10*  ALT 19 16 14 12 11   ALKPHOS 57 57 57 54 51  BILITOT 1.2 1.2 1.1 1.1 1.0  PROT 5.6* 5.9* 5.7* 5.6* 5.8*  ALBUMIN 2.7* 2.6* 2.8* 2.8* 2.9*   No results for input(s): LIPASE, AMYLASE in the last 168 hours. No results for input(s): AMMONIA in the last 168 hours.  Cardiac Enzymes: No results for input(s): CKTOTAL, CKMB, CKMBINDEX, TROPONINI in the last 168 hours. BNP (last 3 results) Recent Labs    01/26/18 1051  BNP 1,290.3*    ProBNP (last 3 results) No  results for input(s): PROBNP in the last 8760 hours.    Studies: No results found.  Scheduled Meds: . sodium chloride   Intravenous Once  . carvedilol  12.5 mg Oral BID WC  . cholecalciferol  2,000 Units Oral Daily  . donepezil  10 mg Oral QHS  . ezetimibe  10 mg Oral QHS  . feeding supplement  1 Container Oral TID BM  . furosemide  40 mg Oral Daily  . insulin aspart  0-5 Units Subcutaneous QHS  . insulin aspart  0-9 Units Subcutaneous TID WC  . insulin glargine  5 Units Subcutaneous QHS  . losartan  25 mg Oral Daily  . multivitamin with minerals  1 tablet Oral Daily  . pantoprazole  40 mg Oral BID  . polyethylene glycol  17 g Oral Daily  . rosuvastatin  10 mg Oral Daily  . sodium chloride flush  3 mL Intravenous Q12H  . tamsulosin  0.4 mg Oral QHS      Time spent: 25 min  [facility] Pager [phone]. If 7PM-7AM, please contact night-coverage at [email or web address], Office  [phone]  password [place]  02/02/2018, 2:34 PM  LOS: 8 days

## 2018-02-02 NOTE — Progress Notes (Signed)
Pt's scheduled carvedilol at 0800.  B/p 125/54 and HR 60 this AM, 109/35 and HR 54 last night. Heparin was held at 0600. Per Md to give carvedilol and start Heparin.  Will continue to monitor.

## 2018-02-02 NOTE — Progress Notes (Signed)
Progress Note  Patient Name: Luis Palmer Date of Encounter: 02/02/2018  Primary Cardiologist: [facility]   Subjective   84 year old gentleman with a history of known chronic systolic congestive heart failure, MDT VIVA XT CRT-D, chronic atrial fibrillation, coronary artery disease-status post coronary artery bypass grafting.  He did was admitted 08/29 with GI bleed.  Colonoscopy showed a colon mass in the ascending colon.  He had a partial colectomy 09/03.  Pathology revealed adenocarcinoma.   09/05: POD 2  No chest pain or dyspnea   Inpatient Medications    Scheduled Meds: . sodium chloride   Intravenous Once  . carvedilol  12.5 mg Oral BID WC  . cholecalciferol  2,000 Units Oral Daily  . donepezil  10 mg Oral QHS  . ezetimibe  10 mg Oral QHS  . feeding supplement  1 Container Oral TID BM  . furosemide  40 mg Intravenous Daily  . insulin aspart  0-5 Units Subcutaneous QHS  . insulin aspart  0-9 Units Subcutaneous TID WC  . insulin glargine  5 Units Subcutaneous QHS  . losartan  25 mg Oral Daily  . multivitamin with minerals  1 tablet Oral Daily  . pantoprazole  40 mg Oral BID  . polyethylene glycol  17 g Oral Daily  . rosuvastatin  10 mg Oral Daily  . sodium chloride flush  3 mL Intravenous Q12H  . tamsulosin  0.4 mg Oral QHS   Continuous Infusions: . sodium chloride Stopped (01/29/18 1305)  . dextrose 5 % and 0.9 % NaCl with KCl 20 mEq/L 50 mL/hr at 02/02/18 1107  . heparin 1,400 Units/hr (02/02/18 1234)  . lactated ringers Stopped (01/30/18 1336)   PRN Meds: sodium chloride, acetaminophen **OR** acetaminophen, methocarbamol, ondansetron **OR** ondansetron (ZOFRAN) IV, oxyCODONE, sodium chloride flush   Vital Signs    Vitals:   02/01/18 0846 02/01/18 1733 02/01/18 2047 02/02/18 0620  BP: (!) 130/59 (!) 118/55 (!) 109/35 (!) 125/54  Pulse:  60 (!) 59 60  Resp:  20  20  Temp:  98 F (36.7 C) 97.9 F (36.6 C) 98 F (36.7 C)  TempSrc:  Oral Oral  Oral  SpO2:  97% 98% 96%  Weight:    100.9 kg  Height:        Intake/Output Summary (Last 24 hours) at 02/02/2018 1241 Last data filed at 02/02/2018 1107 Gross per 24 hour  Intake 2868 ml  Output 3090 ml  Net -222 ml   Filed Weights   01/31/18 0351 02/01/18 0700 02/02/18 7017  Weight: 103 kg 99.1 kg 100.9 kg    Telemetry    Afib with intermittent ventricular pacing - Personally Reviewed   Physical Exam   General: WD, WN NAD Head: Normal Lungs: CTA Heart: irregular Abdomen: s/p abdominal surgery Extremities: No edema.    Neuro: Grossly intact    Labs    Chemistry Recent Labs  Lab 01/30/18 0430 01/31/18 0350 02/01/18 0540 02/02/18 0446  NA 139 140 141 139  K 4.4 4.7 4.2 4.5  CL 103 105 103 102  CO2 28 25 28 30   GLUCOSE 121* 263* 213* 196*  BUN 11 12 14 13   CREATININE 1.34* 1.36* 1.33* 1.30*  CALCIUM 8.9 8.5* 8.9 9.0  PROT 5.7* 5.6* 5.8*  --   ALBUMIN 2.8* 2.8* 2.9*  --   AST 10* 10* 10*  --   ALT 14 12 11   --   ALKPHOS 57 54 51  --   BILITOT 1.1 1.1 1.0  --  GFRNONAA 47* 46* 48* 49*  GFRAA 55* 54* 55* 57*  ANIONGAP 8 10 10 7      Hematology Recent Labs  Lab 01/31/18 0350 02/01/18 0540 02/02/18 0446  WBC 4.9 7.2 6.6  RBC 3.02* 3.16* 3.20*  HGB 8.4* 8.7* 8.8*  HCT 26.9* 27.6* 28.1*  MCV 89.1 87.3 87.8  MCH 27.8 27.5 27.5  MCHC 31.2 31.5 31.3  RDW 16.4* 16.6* 16.4*  PLT 149* 155 162     Patient Profile     84 y.o. male with a recent diagnosis of adenocarcinoma of the colon.  He has chronic atrial fibrillation, chronic systolic congestive heart failure  Assessment & Plan    1.  Atrial fibrillation:  -Plan to continue carvedilol for rate control.  Heparin has been reinitiated.  Would resume Coumadin when okay with surgery.  2.  Chronic systolic congestive heart failure:  -Patient is euvolemic today.  Will change Lasix to 40 mg p.o. Daily.  Decrease IV fluids to KVO.  3.   Ischemic cardia myopathy-continue carvedilol and ARB.  4.    Status post resection of abdominal tumor-Per general surgery.  We will see again Monday.  Please call over the weekend with questions.  For questions or updates, please contact [place] Please consult [email or web address] for contact info under Cardiology/STEMI.      Signed, Kirk Ruths, MD  02/02/2018, 12:41 PM  '

## 2018-02-02 NOTE — Plan of Care (Signed)
  Problem: Health Behavior/Discharge Planning: Goal: Ability to manage health-related needs will improve Outcome: Progressing   Problem: Clinical Measurements: Goal: Ability to maintain clinical measurements within normal limits will improve Outcome: Progressing Goal: Will remain free from infection Outcome: Progressing   Problem: Activity: Goal: Risk for activity intolerance will decrease Outcome: Progressing   Problem: Nutrition: Goal: Adequate nutrition will be maintained Outcome: Progressing   Problem: Pain Managment: Goal: General experience of comfort will improve Outcome: Progressing   Problem: Skin Integrity: Goal: Risk for impaired skin integrity will decrease Outcome: Progressing

## 2018-02-03 LAB — GLUCOSE, CAPILLARY
GLUCOSE-CAPILLARY: 145 mg/dL — AB (ref 70–99)
GLUCOSE-CAPILLARY: 145 mg/dL — AB (ref 70–99)
GLUCOSE-CAPILLARY: 182 mg/dL — AB (ref 70–99)
Glucose-Capillary: 139 mg/dL — ABNORMAL HIGH (ref 70–99)

## 2018-02-03 LAB — CBC
HEMATOCRIT: 27.1 % — AB (ref 39.0–52.0)
HEMOGLOBIN: 8.3 g/dL — AB (ref 13.0–17.0)
MCH: 27 pg (ref 26.0–34.0)
MCHC: 30.6 g/dL (ref 30.0–36.0)
MCV: 88.3 fL (ref 78.0–100.0)
Platelets: 158 10*3/uL (ref 150–400)
RBC: 3.07 MIL/uL — AB (ref 4.22–5.81)
RDW: 16.5 % — AB (ref 11.5–15.5)
WBC: 5.2 10*3/uL (ref 4.0–10.5)

## 2018-02-03 LAB — HEPARIN LEVEL (UNFRACTIONATED)
HEPARIN UNFRACTIONATED: 0.96 [IU]/mL — AB (ref 0.30–0.70)
HEPARIN UNFRACTIONATED: 0.64 [IU]/mL (ref 0.30–0.70)

## 2018-02-03 MED ORDER — SODIUM CHLORIDE 0.9 % IV SOLN
250.0000 mL | INTRAVENOUS | Status: DC
  Administered 2018-02-03: 1000 mL via INTRAVENOUS

## 2018-02-03 NOTE — Progress Notes (Signed)
Pt states that he will place self on CPAP once he is ready for bed. Wife is at the bedside at this time.

## 2018-02-03 NOTE — Progress Notes (Signed)
ANTICOAGULATION CONSULT NOTE  Pharmacy Consult:  Heparin Indication: atrial fibrillation  Patient Measurements: Height: 6' (182.9 cm) Weight: 221 lb 12.5 oz (100.6 kg) IBW/kg (Calculated) : 77.6 Heparin Dosing Weight: 98 kg  Vital Signs: Temp: 97.5 F (36.4 C) (09/07 1630) Temp Source: Oral (09/07 1630) BP: 106/55 (09/07 1630) Pulse Rate: 60 (09/07 1630)  Labs: Recent Labs    02/01/18 0540 02/02/18 0446 02/02/18 2015 02/03/18 0547 02/03/18 1624  HGB 8.7* 8.8*  --  8.3*  --   HCT 27.6* 28.1*  --  27.1*  --   PLT 155 162  --  158  --   HEPARINUNFRC  --   --  0.70 0.96* 0.64  CREATININE 1.33* 1.30*  --   --   --      Assessment: 84 year old male on warfarin PTA for Afib, which is on hold s/p right hemicolectomy due to colon tumor on 01/30/18. Pharmacy consulted to manage heparin drip with plan to resume warfarin once tolerating soft diet. Heparin level returned slightly supratherapeutic.   Goal of Therapy:  Heparin level 0.3-0.5 units/ml with recent GI bleed Monitor platelets by anticoagulation protocol: Yes    Plan:  Reduce heparin gtt to 1000 units/hr Daily heparin level and CBC Next level with AM labs   Harvel Quale 02/03/2018 6:04 PM

## 2018-02-03 NOTE — Progress Notes (Signed)
[facility] Progress Note  4 Days Post-Op  Subjective: CC: didn't sleep well, bed uncomfortable Eating well, minimal abdominal pain, having bowel function   Objective: Vital signs in last 24 hours: Temp:  [97.8 F (36.6 C)-98.3 F (36.8 C)] 97.8 F (36.6 C) (09/07 0517) Pulse Rate:  [59-60] 59 (09/07 0517) Resp:  [16-18] 18 (09/07 0517) BP: (123-128)/(50-54) 128/52 (09/07 0517) SpO2:  [95 %-100 %] 100 % (09/07 0517) Last BM Date: 02/02/18  Intake/Output from previous day: 09/06 0701 - 09/07 0700 In: 1447.5 [P.O.:810; I.V.:637.5] Out: 1990 [Urine:1600; Drains:390] Intake/Output this shift: Total I/O In: -  Out: 260 [Urine:200; Drains:60]  PE: Gen:  Alert, NAD, pleasant Card:  Regular rate and rhythm, pedal pulses 2+ BL Pulm:  Normal effort, clear to auscultation bilaterally Abd: Soft, mildly tender around incision, non-distended, bowel sounds present, no HSM, incisions C/D/I with staples present; drain present in upper abdomen with serous output Skin: warm and dry, no rashes  Psych: A&Ox3   Lab Results:  Recent Labs    02/02/18 0446 02/03/18 0547  WBC 6.6 5.2  HGB 8.8* 8.3*  HCT 28.1* 27.1*  PLT 162 158   BMET Recent Labs    02/01/18 0540 02/02/18 0446  NA 141 139  K 4.2 4.5  CL 103 102  CO2 28 30  GLUCOSE 213* 196*  BUN 14 13  CREATININE 1.33* 1.30*  CALCIUM 8.9 9.0   PT/INR No results for input(s): LABPROT, INR in the last 72 hours. CMP     Component Value Date/Time   NA 139 02/02/2018 0446   NA 133 (L) 08/03/2013 0455   K 4.5 02/02/2018 0446   K 4.7 08/03/2013 0455   CL 102 02/02/2018 0446   CL 101 08/03/2013 0455   CO2 30 02/02/2018 0446   CO2 27 08/03/2013 0455   GLUCOSE 196 (H) 02/02/2018 0446   GLUCOSE 151 (H) 08/03/2013 0455   BUN 13 02/02/2018 0446   BUN 16 08/03/2013 0455   CREATININE 1.30 (H) 02/02/2018 0446   CREATININE 1.13 08/03/2013 0455   CALCIUM 9.0 02/02/2018 0446   CALCIUM 9.2 08/03/2013 0455   PROT 5.8  (L) 02/01/2018 0540   PROT 6.6 07/31/2013 0437   ALBUMIN 2.9 (L) 02/01/2018 0540   ALBUMIN 2.6 (L) 07/31/2013 0437   AST 10 (L) 02/01/2018 0540   AST 20 07/31/2013 0437   ALT 11 02/01/2018 0540   ALT 27 07/31/2013 0437   ALKPHOS 51 02/01/2018 0540   ALKPHOS 86 07/31/2013 0437   BILITOT 1.0 02/01/2018 0540   BILITOT 1.2 (H) 07/31/2013 0437   GFRNONAA 49 (L) 02/02/2018 0446   GFRNONAA >60 08/03/2013 0455   GFRAA 57 (L) 02/02/2018 0446   GFRAA >60 08/03/2013 0455   Lipase     Component Value Date/Time   LIPASE 47 01/25/2018 0508       Studies/Results: No results found.  Anti-infectives: Anti-infectives (From admission, onward)   Start     Dose/Rate Route Frequency Ordered Stop   01/30/18 1030  cefoTEtan (CEFOTAN) 2 g in sodium chloride 0.9 % 100 mL IVPB     2 g 200 mL/hr over 30 Minutes Intravenous To Surgery 01/30/18 1027 01/31/18 1608       Assessment/Plan Type 2 diabetes mellitus - SSI Atrial fibrillation - per primary team, on SQ heparin Chronic systolic CHF - last EF around 20%, management of fluid status per cardiology Hx of CABG Acute on Chronic Anemia - H/H 8.8/28.1, stable  Transverse colon adenocarcinoma -  s/p laparoscopic assisted extended right hemicolectomy 01/30/18 Dr. Marlou Starks - POD#4 - drain output decreasing, serous, still 390 - advance to soft diet - restart heparin gtt and work on transitioning back to coumadin once tolerating soft diet - continue to mobilize   FEN: soft, decreased IVF VTE:  heparin gtt and then transition to coumadin  ID: [email or web address] periop     LOS: 9 days    Luis Palmer , MD [facility] 02/03/2018, 8:26 AM

## 2018-02-03 NOTE — Progress Notes (Signed)
ANTICOAGULATION CONSULT NOTE  Pharmacy Consult:  Heparin Indication: atrial fibrillation  Allergies  Allergen Reactions  . Pseudoephedrine Hcl Other (See Comments)    Other Reaction: ANURIA  . Ace Inhibitors Cough and Other (See Comments)    cough cough   . Nsaids Other (See Comments)    Reaction:  Fluid retention   . Vasotec [Enalapril] Cough    Patient Measurements: Height: 6' (182.9 cm) Weight: 222 lb 7.1 oz (100.9 kg) IBW/kg (Calculated) : 77.6 Heparin Dosing Weight: 98 kg  Vital Signs: Temp: 97.8 F (36.6 C) (09/07 0517) Temp Source: Oral (09/07 0517) BP: 128/52 (09/07 0517) Pulse Rate: 59 (09/07 0517)  Labs: Recent Labs    02/01/18 0540 02/02/18 0446 02/02/18 2015 02/03/18 0547  HGB 8.7* 8.8*  --  8.3*  HCT 27.6* 28.1*  --  27.1*  PLT 155 162  --  158  HEPARINUNFRC  --   --  0.70 0.96*  CREATININE 1.33* 1.30*  --   --     Estimated Creatinine Clearance: 52.9 mL/min (A) (by C-G formula based on SCr of 1.3 mg/dL (H)).   Assessment: 85 YOM on Coumadin PTA for Afib, which is on hold s/p right hemicolectomy due to colon tumor on 01/30/18. Pharmacy consulted to manage heparin drip with plan to resume Coumadin once tolerating soft diet.   Heparin level is supra-therapeutic.  No overt bleeding per RN.   Goal of Therapy:  Heparin level 0.3-0.5 units/ml with recent GI bleed Monitor platelets by anticoagulation protocol: Yes    Plan:  Reduce heparin gtt to 1000 units/hr Check 8 hr heparin level Daily heparin level and CBC   Kjuan Seipp D. Mina Marble, PharmD, BCPS, [place] 02/03/2018, 8:13 AM

## 2018-02-03 NOTE — Progress Notes (Signed)
[facility]  PROGRESS NOTE  Luis Palmer MWN:[id number] DOB: 1933-05-04 DOA: 01/25/2018 PCP: Idelle Crouch, MD   Brief HPI:   84 year old male with a history of ischemic cardiomyopathy, status post ICD, atrial fibrillation status post ablation x2, CAD with history of CABG, chronic systolic heart failure, chronic atrial fibrillation on anticoagulation was initially admitted to [facility] hospital day on 01/21/2018 with acute onset of bright red blood per rectum.  He was given Kcentra to reverse anticoagulation and transfuse PRBC.  He underwent colonoscopy showing colon mass in the ascending colon that was biopsied showing adenocarcinoma of the colon.    He was seen by general surgery and was recommended resection.  Cardiology saw the patient cleared for surgery.  Family wanted to be transferred to [facility] for surgery given extensive history of heart disease.  Patient underwent laparoscopic-assisted extended right colectomy, cystoscopy with insertion of Foley catheter.  Subjective   Patient seen and examined, complains of dark urine.   Assessment/Plan:     1. Status post laparoscopic right colectomy-patient was diagnosed to have adenocarcinoma of ascending colon.  Underwent laparoscopic-assisted extended right colectomy.  Patient diet has been advanced to full liquid diet.  2. Acute blood loss anemia-secondary to rectal bleeding with adenocarcinoma, hemoglobin is stable at 8.4.  Has low iron stores, iron level of 17, U IBC in 194, TIBC 211, saturation ratio 8%.  EGD showed gastritis.  Follow hemoglobin and transfuse for hemoglobin less than 7.  3. Ischemic cardiomyopathy-EF of 20%, continue beta-blocker, ARB on hold.  Lasix was changed to 40 mg daily, IV fluids were changed to [facility] per cardiology.  Will hold Lasix today, give IV normal saline at 50 mill per hour for 8 hours.  Check BMP in a.m.  Patient has V pacemaker, pacemaker interrogation as per  cardiology.  4. Chronic atrial fibrillation-heart rate is controlled, continue Coreg 12.5 mg twice a day.  Coumadin is on hold, INR was reversed.  General surgery has restarted heparin.  Will start Coumadin per pharmacy consultation  5. Chronic kidney disease stage III-creatinine stable, at baseline.  Today creatinine is 1.36.  Patient is still on Lasix and complains of dark urine.  Concern for worsening renal function.  Will hold Lasix, give gentle IV hydration with normal saline at 50 mill per hour for 8 hours.  Check BMP in a.m.  6. Hypertension-blood pressure stable, continue ARB, Coreg.  7. Mild dementia-continue Aricept 10 mg p.o. Daily  8. CAD with history of CABG-continue Coreg, rosuvastatin  9. Diabetes mellitus type 2-blood glucose is elevated in 200 range, patient is on D5 half-normal saline.  Continue Lantus 5 units subcu daily.  Sliding scale insulin with NovoLog.  10. Hypomagnesemia-replete, last magnesium was 2.0   CBG: Recent Labs  Lab 02/02/18 1211 02/02/18 1706 02/02/18 2115 02/03/18 0811 02/03/18 1235  GLUCAP 176* 202* 203* 145* 145*    CBC: Recent Labs  Lab 01/28/18 0600 01/29/18 0444 01/30/18 0430 01/31/18 0350 02/01/18 0540 02/02/18 0446 02/03/18 0547  WBC 5.8 5.5 6.0 4.9 7.2 6.6 5.2  NEUTROABS 3.1 2.8 2.9 4.0  --   --   --   HGB 8.6* 8.5* 8.7* 8.4* 8.7* 8.8* 8.3*  HCT 28.2* 27.0* 27.5* 26.9* 27.6* 28.1* 27.1*  MCV 89.8 86.3 87.0 89.1 87.3 87.8 88.3  PLT 155 156 167 149* 155 162 403    Basic Metabolic Panel: Recent Labs  Lab 01/28/18 0600 01/29/18 0444 01/30/18 0430 01/31/18 0350 02/01/18 0540 02/02/18 0446  NA 140  141 139 140 141 139  K 3.3* 3.6 4.4 4.7 4.2 4.5  CL 107 105 103 105 103 102  CO2 26 28 28 25 28 30   GLUCOSE 120* 120* 121* 263* 213* 196*  BUN 15 13 11 12 14 13   CREATININE 1.40* 1.43* 1.34* 1.36* 1.33* 1.30*  CALCIUM 8.7* 8.8* 8.9 8.5* 8.9 9.0  MG 1.5* 1.7 2.0 2.0  --   --   PHOS 3.7 3.3 2.7 3.8  --   --      DVT  prophylaxis: SCDs  Code Status: Full code  Family Communication: No family at bedside  Disposition Plan: likely home when medically ready for discharge   Consultants:  General surgery  Cardiology  Procedures:  Laparoscopic assisted right colectomy   Antibiotics:   Anti-infectives (From admission, onward)   Start     Dose/Rate Route Frequency Ordered Stop   01/30/18 1030  cefoTEtan (CEFOTAN) 2 g in sodium chloride 0.9 % 100 mL IVPB     2 g 200 mL/hr over 30 Minutes Intravenous To Surgery 01/30/18 1027 01/31/18 1608       Objective   Vitals:   02/02/18 1836 02/02/18 2118 02/02/18 2230 02/03/18 0517  BP: (!) 125/50 (!) 125/54  (!) 128/52  Pulse: 60 (!) 59 60 (!) 59  Resp:  18 16 18   Temp:  98.3 F (36.8 C)  97.8 F (36.6 C)  TempSrc:  Oral  Oral  SpO2:  95% 95% 100%  Weight:      Height:        Intake/Output Summary (Last 24 hours) at 02/03/2018 1511 Last data filed at 02/03/2018 0815 Gross per 24 hour  Intake -  Output 1328 ml  Net -1328 ml   Filed Weights   01/31/18 0351 02/01/18 0700 02/02/18 0620  Weight: 103 kg 99.1 kg 100.9 kg     Physical Examination:   Mouth: Oral mucosa is moist, no lesions on palate,  Neck: Supple, no deformities, masses, or tenderness Lungs: Normal respiratory effort, bilateral clear to auscultation, no crackles or wheezes.  Heart: Regular rate and rhythm, S1 and S2 normal, no murmurs, rubs auscultated Abdomen: BS normoactive,soft,nondistended, mild generalized tenderness to palpation,no organomegaly Extremities: No pretibial edema, no erythema, no cyanosis, no clubbing Neuro : Alert and oriented to time, place and person, No focal deficits     Data Reviewed: I have personally reviewed following labs and imaging studies   Recent Results (from the past 240 hour(s))  Surgical pcr screen     Status: None   Collection Time: 01/30/18  5:19 AM  Result Value Ref Range Status   MRSA, PCR NEGATIVE NEGATIVE Final    Staphylococcus aureus NEGATIVE NEGATIVE Final    Comment: (NOTE) The Xpert SA Assay (FDA approved for NASAL specimens in patients 66 years of age and older), is one component of a comprehensive surveillance program. It is not intended to diagnose infection nor to guide or monitor treatment. Performed at [facility], [place] [address], [place], [place] [address]      Liver Function Tests: Recent Labs  Lab 01/28/18 0600 01/29/18 0444 01/30/18 0430 01/31/18 0350 02/01/18 0540  AST 12* 11* 10* 10* 10*  ALT 19 16 14 12 11   ALKPHOS 57 57 57 54 51  BILITOT 1.2 1.2 1.1 1.1 1.0  PROT 5.6* 5.9* 5.7* 5.6* 5.8*  ALBUMIN 2.7* 2.6* 2.8* 2.8* 2.9*   No results for input(s): LIPASE, AMYLASE in the last 168 hours. No results for input(s):  AMMONIA in the last 168 hours.  Cardiac Enzymes: No results for input(s): CKTOTAL, CKMB, CKMBINDEX, TROPONINI in the last 168 hours. BNP (last 3 results) Recent Labs    01/26/18 1051  BNP 1,290.3*    ProBNP (last 3 results) No results for input(s): PROBNP in the last 8760 hours.    Studies: No results found.  Scheduled Meds: . sodium chloride   Intravenous Once  . carvedilol  12.5 mg Oral BID WC  . cholecalciferol  2,000 Units Oral Daily  . donepezil  10 mg Oral QHS  . ezetimibe  10 mg Oral QHS  . feeding supplement  1 Container Oral TID BM  . insulin aspart  0-5 Units Subcutaneous QHS  . insulin aspart  0-9 Units Subcutaneous TID WC  . insulin glargine  5 Units Subcutaneous QHS  . losartan  25 mg Oral Daily  . multivitamin with minerals  1 tablet Oral Daily  . pantoprazole  40 mg Oral BID  . polyethylene glycol  17 g Oral Daily  . rosuvastatin  10 mg Oral Daily  . sodium chloride flush  3 mL Intravenous Q12H  . tamsulosin  0.4 mg Oral QHS      Time spent: 25 min  [facility] Pager [phone]. If 7PM-7AM, please contact night-coverage at [email or web address], Office  [phone]  password  [email or web address]  02/03/2018, 3:11 PM  LOS: 9 days

## 2018-02-04 LAB — CBC
HCT: 23.6 % — ABNORMAL LOW (ref 39.0–52.0)
Hemoglobin: 7.4 g/dL — ABNORMAL LOW (ref 13.0–17.0)
MCH: 27.7 pg (ref 26.0–34.0)
MCHC: 31.4 g/dL (ref 30.0–36.0)
MCV: 88.4 fL (ref 78.0–100.0)
PLATELETS: 155 10*3/uL (ref 150–400)
RBC: 2.67 MIL/uL — ABNORMAL LOW (ref 4.22–5.81)
RDW: 16.6 % — ABNORMAL HIGH (ref 11.5–15.5)
WBC: 4.3 10*3/uL (ref 4.0–10.5)

## 2018-02-04 LAB — GLUCOSE, CAPILLARY
GLUCOSE-CAPILLARY: 115 mg/dL — AB (ref 70–99)
Glucose-Capillary: 170 mg/dL — ABNORMAL HIGH (ref 70–99)
Glucose-Capillary: 210 mg/dL — ABNORMAL HIGH (ref 70–99)
Glucose-Capillary: 171 mg/dL — ABNORMAL HIGH (ref 70–99)

## 2018-02-04 LAB — BASIC METABOLIC PANEL
Anion gap: 8 (ref 5–15)
BUN: 11 mg/dL (ref 8–23)
CO2: 28 mmol/L (ref 22–32)
CREATININE: 1.39 mg/dL — AB (ref 0.61–1.24)
Calcium: 8.6 mg/dL — ABNORMAL LOW (ref 8.9–10.3)
Chloride: 103 mmol/L (ref 98–111)
GFR calc Af Amer: 52 mL/min — ABNORMAL LOW (ref >60)
GFR calc non Af Amer: 45 mL/min — ABNORMAL LOW (ref >60)
Glucose, Bld: 129 mg/dL — ABNORMAL HIGH (ref 70–99)
Potassium: 3.7 mmol/L (ref 3.5–5.1)
SODIUM: 139 mmol/L (ref 135–145)

## 2018-02-04 LAB — HEPARIN LEVEL (UNFRACTIONATED): Heparin Unfractionated: 0.42 IU/mL (ref 0.30–0.70)

## 2018-02-04 MED ORDER — WARFARIN SODIUM 7.5 MG PO TABS
7.5000 mg | ORAL_TABLET | Freq: Once | ORAL | Status: DC
  Filled 2018-02-04: qty 1

## 2018-02-04 MED ORDER — FUROSEMIDE 40 MG PO TABS
40.0000 mg | ORAL_TABLET | Freq: Every day | ORAL | Status: DC
  Administered 2018-02-04 – 2018-02-05 (×2): 40 mg via ORAL
  Filled 2018-02-04 (×2): qty 1

## 2018-02-04 MED ORDER — WARFARIN - PHARMACIST DOSING INPATIENT: Freq: Every day | Status: DC

## 2018-02-04 NOTE — Discharge Instructions (Signed)
[facility], [place] [phone]  OPEN ABDOMINAL SURGERY: POST OP INSTRUCTIONS  Always review your discharge instruction sheet given to you by the facility where your surgery was performed.  IF YOU HAVE DISABILITY OR FAMILY LEAVE FORMS, YOU MUST BRING THEM TO THE OFFICE FOR PROCESSING.  PLEASE DO NOT GIVE THEM TO YOUR DOCTOR.  1. A prescription for pain medication may be given to you upon discharge.  Take your pain medication as prescribed, if needed.  If narcotic pain medicine is not needed, then you may take acetaminophen (Tylenol) or ibuprofen (Advil) as needed. 2. Take your usually prescribed medications unless otherwise directed. 3. If you need a refill on your pain medication, please contact your pharmacy. They will contact our office to request authorization.  Prescriptions will not be filled after 5pm or on week-ends. 4. You should follow a light diet the first few days after arrival home, such as soup and crackers, pudding, etc.unless your doctor has advised otherwise. A high-fiber, low fat diet can be resumed as tolerated.   Be sure to include lots of fluids daily. Most patients will experience some swelling and bruising on the chest and neck area.  Ice packs will help.  Swelling and bruising can take several days to resolve 5. Most patients will experience some swelling and bruising in the area of the incision. Ice pack will help. Swelling and bruising can take several days to resolve..  6. It is common to experience some constipation if taking pain medication after surgery.  Increasing fluid intake and taking a stool softener will usually help or prevent this problem from occurring.  A mild laxative (Milk of Magnesia or Miralax) should be taken according to package directions if there are no bowel movements after 48 hours. 7.  You may have steri-strips (small skin tapes) in place directly over the incision.  These strips should be left on the skin for 7-10 days.  If your  surgeon used skin glue on the incision, you may shower in 24 hours.  The glue will flake off over the next 2-3 weeks.  Any sutures or staples will be removed at the office during your follow-up visit. You may find that a light gauze bandage over your incision may keep your staples from being rubbed or pulled. You may shower and replace the bandage daily. 8. ACTIVITIES:  You may resume regular (light) daily activities beginning the next day--such as daily self-care, walking, climbing stairs--gradually increasing activities as tolerated.  You may have sexual intercourse when it is comfortable.  Refrain from any heavy lifting or straining until approved by your doctor. a. You may drive when you no longer are taking prescription pain medication, you can comfortably wear a seatbelt, and you can safely maneuver your car and apply brakes 9. You should see your doctor in the office for a follow-up appointment approximately two weeks after your surgery.  Make sure that you call for this appointment within a day or two after you arrive home to insure a convenient appointment time.   WHEN TO CALL YOUR DOCTOR: 1. Fever over 101.0 2. Inability to urinate 3. Nausea and/or vomiting 4. Extreme swelling or bruising 5. Continued bleeding from incision. 6. Increased pain, redness, or drainage from the incision. 7. Difficulty swallowing or breathing 8. Muscle cramping or spasms. 9. Numbness or tingling in hands or feet or around lips.  The clinic staff is available to answer your questions during regular business hours.  Please  dont hesitate to call and ask to speak to one of the nurses if you have concerns.  For further questions, please visit [email or web address]     Information on my medicine - Coumadin   (Warfarin)  This medication education was reviewed with me or my healthcare representative as part of my discharge preparation.    Why was Coumadin prescribed for you? Coumadin was  prescribed for you because you have a blood clot or a medical condition that can cause an increased risk of forming blood clots. Blood clots can cause serious health problems by blocking the flow of blood to the heart, lung, or brain. Coumadin can prevent harmful blood clots from forming. As a reminder your indication for Coumadin is:   Stroke Prevention Because Of Atrial Fibrillation  What test will check on my response to Coumadin? While on Coumadin (warfarin) you will need to have an INR test regularly to ensure that your dose is keeping you in the desired range. The INR (international normalized ratio) number is calculated from the result of the laboratory test called prothrombin time (PT).  If an INR APPOINTMENT HAS NOT ALREADY BEEN MADE FOR YOU please schedule an appointment to have this lab work done by your health care provider within 7 days. Your INR goal is usually a number between:  2 to 3 or your provider may give you a more narrow range like 2-2.5.  Ask your health care provider during an office visit what your goal INR is.  What  do you need to  know  About  COUMADIN? Take Coumadin (warfarin) exactly as prescribed by your healthcare provider about the same time each day.  DO NOT stop taking without talking to the doctor who prescribed the medication.  Stopping without other blood clot prevention medication to take the place of Coumadin may increase your risk of developing a new clot or stroke.  Get refills before you run out.  What do you do if you miss a dose? If you miss a dose, take it as soon as you remember on the same day then continue your regularly scheduled regimen the next day.  Do not take two doses of Coumadin at the same time.  Important Safety Information A possible side effect of Coumadin (Warfarin) is an increased risk of bleeding. You should call your healthcare provider right away if you experience any of the following: ? Bleeding from an injury or your nose that does  not stop. ? Unusual colored urine (red or dark brown) or unusual colored stools (red or black). ? Unusual bruising for unknown reasons. ? A serious fall or if you hit your head (even if there is no bleeding).  Some foods or medicines interact with Coumadin (warfarin) and might alter your response to warfarin. To help avoid this: ? Eat a balanced diet, maintaining a consistent amount of Vitamin K. ? Notify your provider about major diet changes you plan to make. ? Avoid alcohol or limit your intake to 1 drink for women and 2 drinks for men per day. (1 drink is 5 oz. wine, 12 oz. beer, or 1.5 oz. liquor.)  Make sure that ANY health care provider who prescribes medication for you knows that you are taking Coumadin (warfarin).  Also make sure the healthcare provider who is monitoring your Coumadin knows when you have started a new medication including herbals and non-prescription products.  Coumadin (Warfarin)  Major Drug Interactions  Increased Warfarin Effect Decreased Warfarin Effect  Alcohol (large quantities) Antibiotics (  esp. Septra/Bactrim, Flagyl, Cipro) Amiodarone (Cordarone) Aspirin (ASA) Cimetidine (Tagamet) Megestrol (Megace) NSAIDs (ibuprofen, naproxen, etc.) Piroxicam (Feldene) Propafenone (Rythmol SR) Propranolol (Inderal) Isoniazid (INH) Posaconazole (Noxafil) Barbiturates (Phenobarbital) Carbamazepine (Tegretol) Chlordiazepoxide (Librium) Cholestyramine (Questran) Griseofulvin Oral Contraceptives Rifampin Sucralfate (Carafate) Vitamin K   Coumadin (Warfarin) Major Herbal Interactions  Increased Warfarin Effect Decreased Warfarin Effect  Garlic Ginseng Ginkgo biloba Coenzyme Q10 Green tea St. Johns wort    Coumadin (Warfarin) FOOD Interactions  Eat a consistent number of servings per week of foods HIGH in Vitamin K (1 serving =  cup)  Collards (cooked, or boiled & drained) Kale (cooked, or boiled & drained) Mustard greens (cooked, or boiled &  drained) Parsley *serving size only =  cup Spinach (cooked, or boiled & drained) Swiss chard (cooked, or boiled & drained) Turnip greens (cooked, or boiled & drained)  Eat a consistent number of servings per week of foods MEDIUM-HIGH in Vitamin K (1 serving = 1 cup)  Asparagus (cooked, or boiled & drained) Broccoli (cooked, boiled & drained, or raw & chopped) Brussel sprouts (cooked, or boiled & drained) *serving size only =  cup Lettuce, raw (green leaf, endive, romaine) Spinach, raw Turnip greens, raw & chopped   These websites have more information on Coumadin (warfarin):  FailFactory.se; VeganReport.com.au;

## 2018-02-04 NOTE — Progress Notes (Signed)
Called by RN that patient continues to have bloody stool and bloody drainage from the [place].  Will discontinue heparin, Coumadin at this time.  Continue to monitor.  Follow CBC in a.m.

## 2018-02-04 NOTE — Progress Notes (Signed)
ANTICOAGULATION CONSULT NOTE  Pharmacy Consult:  Heparin / Coumadin Indication: atrial fibrillation  Allergies  Allergen Reactions  . Pseudoephedrine Hcl Other (See Comments)    Other Reaction: ANURIA  . Ace Inhibitors Cough and Other (See Comments)    cough cough   . Nsaids Other (See Comments)    Reaction:  Fluid retention   . Vasotec [Enalapril] Cough    Patient Measurements: Height: 6' (182.9 cm) Weight: 221 lb 12.5 oz (100.6 kg) IBW/kg (Calculated) : 77.6 Heparin Dosing Weight: 98 kg  Vital Signs: Temp: 97.9 F (36.6 C) (09/08 0540) Temp Source: Oral (09/08 0540) BP: 125/48 (09/08 0540) Pulse Rate: 59 (09/08 0540)  Labs: Recent Labs    02/02/18 0446  02/03/18 0547 02/03/18 1624 02/04/18 0531  HGB 8.8*  --  8.3*  --  7.4*  HCT 28.1*  --  27.1*  --  23.6*  PLT 162  --  158  --  155  HEPARINUNFRC  --    < > 0.96* 0.64 0.42  CREATININE 1.30*  --   --   --  1.39*   < > = values in this interval not displayed.    Estimated Creatinine Clearance: 49.4 mL/min (A) (by C-G formula based on SCr of 1.39 mg/dL (H)).   Assessment: 85 YOM on Coumadin PTA for Afib.  Med held on admit for right hemicolectomy due to colon tumor on 01/30/18. Pharmacy consulted to manage heparin drip post-op and now to resume Coumadin with improved PO intake.  Heparin level is therapeutic.  INR low at 1.41 on 01/27/18 and expect it to be lower now.  No overt bleeding per RN.   Goal of Therapy:  Heparin level 0.3-0.5 units/ml with recent GI bleed INR 2 - 3 Monitor platelets by anticoagulation protocol: Yes    Plan:  Continue heparin gtt at 900 units/hr Coumadin 7.5mg  PO today Daily PT / INR, heparin level and CBC   Ival Pacer D. Mina Marble, PharmD, BCPS, [place] 02/04/2018, 9:39 AM

## 2018-02-04 NOTE — Progress Notes (Signed)
[facility]  PROGRESS NOTE  Luis Palmer POE:[id number] DOB: June 02, 1932 DOA: 01/25/2018 PCP: Idelle Crouch, MD   Brief HPI:   84 year old male with a history of ischemic cardiomyopathy, status post ICD, atrial fibrillation status post ablation x2, CAD with history of CABG, chronic systolic heart failure, chronic atrial fibrillation on anticoagulation was initially admitted to [facility] regional hospital day on 01/21/2018 with acute onset of bright red blood per rectum.  He was given Kcentra to reverse anticoagulation and transfuse PRBC.  He underwent colonoscopy showing colon mass in the ascending colon that was biopsied showing adenocarcinoma of the colon.    He was seen by general surgery and was recommended resection.  Cardiology saw the patient cleared for surgery.  Family wanted to be transferred to [facility] for surgery given extensive history of heart disease.  Patient underwent laparoscopic-assisted extended right colectomy, cystoscopy with insertion of Foley catheter.  Subjective   Patient seen and examined, complains of blood in the stool.  Hemoglobin is down to 7.4.  He was also started on IV normal saline yesterday   Assessment/Plan:     1. Status post laparoscopic right colectomy-patient was diagnosed to have adenocarcinoma of ascending colon.  Underwent laparoscopic-assisted extended right colectomy.  Patient diet has been advanced to full liquid diet.  2. Acute blood loss anemia-secondary to rectal bleeding with adenocarcinoma, hemoglobin has dropped to 7.4 .  Has low iron stores, iron level of 17, U IBC in 194, TIBC 211, saturation ratio 8%.  EGD showed gastritis.  Follow hemoglobin and transfuse for hemoglobin less than 7.  3. Ischemic cardiomyopathy-EF of 20%, continue beta-blocker, ARB on hold.  Lasix was changed to 40 mg daily, IV fluids were changed to [facility] per cardiology.  Lasix was held yesterday also given gentle IV hydration with normal saline at 50 mill  per hour.  Will discontinue IV fluids.  No signs of fluid overload.  Will restart Lasix at 40 mg p.o. Daily, check BMP in a.m.  Patient has V pacemaker, pacemaker interrogation as per cardiology.  4. Chronic atrial fibrillation-heart rate is controlled, continue Coreg 12.5 mg twice a day.  Coumadin is on hold, INR was reversed. General surgery has restarted heparin.  Coumadin per pharmacy consultation  5. Chronic kidney disease stage III-creatinine stable, at baseline.  Today creatinine is 1.39.  Lasix was held yesterday as patient complained of dark urine.  Will restart Lasix  6. Hypertension-blood pressure stable, continue ARB, Coreg.  7. Mild dementia-continue Aricept 10 mg p.o. Daily  8. CAD with history of CABG-continue Coreg, rosuvastatin  9. Diabetes mellitus type 2-blood glucose is elevated in 200 range, patient is on D5 half-normal saline.  Continue Lantus 5 units subcu daily.  Sliding scale insulin with NovoLog.  10. Hypomagnesemia-replete, last magnesium was 2.0   CBG: Recent Labs  Lab 02/03/18 0811 02/03/18 1235 02/03/18 1734 02/03/18 2136 02/04/18 0737  GLUCAP 145* 145* 182* 139* 115*    CBC: Recent Labs  Lab 01/29/18 0444 01/30/18 0430 01/31/18 0350 02/01/18 0540 02/02/18 0446 02/03/18 0547 02/04/18 0531  WBC 5.5 6.0 4.9 7.2 6.6 5.2 4.3  NEUTROABS 2.8 2.9 4.0  --   --   --   --   HGB 8.5* 8.7* 8.4* 8.7* 8.8* 8.3* 7.4*  HCT 27.0* 27.5* 26.9* 27.6* 28.1* 27.1* 23.6*  MCV 86.3 87.0 89.1 87.3 87.8 88.3 88.4  PLT 156 167 149* 155 162 158 315    Basic Metabolic Panel: Recent Labs  Lab 01/29/18 0444 01/30/18  0430 01/31/18 0350 02/01/18 0540 02/02/18 0446 02/04/18 0531  NA 141 139 140 141 139 139  K 3.6 4.4 4.7 4.2 4.5 3.7  CL 105 103 105 103 102 103  CO2 28 28 25 28 30 28   GLUCOSE 120* 121* 263* 213* 196* 129*  BUN 13 11 12 14 13 11   CREATININE 1.43* 1.34* 1.36* 1.33* 1.30* 1.39*  CALCIUM 8.8* 8.9 8.5* 8.9 9.0 8.6*  MG 1.7 2.0 2.0  --   --   --    PHOS 3.3 2.7 3.8  --   --   --      DVT prophylaxis: SCDs  Code Status: Full code  Family Communication: No family at bedside  Disposition Plan: likely home when medically ready for discharge   Consultants:  General surgery  Cardiology  Procedures:  Laparoscopic assisted right colectomy   Antibiotics:   Anti-infectives (From admission, onward)   Start     Dose/Rate Route Frequency Ordered Stop   01/30/18 1030  cefoTEtan (CEFOTAN) 2 g in sodium chloride 0.9 % 100 mL IVPB     2 g 200 mL/hr over 30 Minutes Intravenous To Surgery 01/30/18 1027 01/31/18 1608       Objective   Vitals:   02/03/18 0517 02/03/18 1630 02/03/18 2230 02/04/18 0540  BP: (!) 128/52 (!) 106/55 (!) 106/49 (!) 125/48  Pulse: (!) 59 60 (!) 59 (!) 59  Resp: 18 17    Temp: 97.8 F (36.6 C) (!) 97.5 F (36.4 C) 97.7 F (36.5 C) 97.9 F (36.6 C)  TempSrc: Oral Oral Oral Oral  SpO2: 100% 98% 100% 96%  Weight:  100.6 kg    Height:        Intake/Output Summary (Last 24 hours) at 02/04/2018 1151 Last data filed at 02/04/2018 0700 Gross per 24 hour  Intake 440 ml  Output 1650 ml  Net -1210 ml   Filed Weights   02/01/18 0700 02/02/18 0620 02/03/18 1630  Weight: 99.1 kg 100.9 kg 100.6 kg     Physical Examination:    Eyes: No icterus, extraocular muscles intact  Mouth: Oral mucosa is moist, no lesions on palate,  Neck: Supple, no deformities, masses, or tenderness Lungs: Normal respiratory effort, bilateral clear to auscultation, no crackles or wheezes.  Heart: Regular rate and rhythm, S1 and S2 normal, no murmurs, rubs auscultated Abdomen: BS normoactive,soft,nondistended,non-tender to palpation,no organomegaly Extremities: No pretibial edema, no erythema, no cyanosis, no clubbing Neuro : Alert and oriented to time, place and person, No focal deficits     Data Reviewed: I have personally reviewed following labs and imaging studies   Recent Results (from the past 240 hour(s))   Surgical pcr screen     Status: None   Collection Time: 01/30/18  5:19 AM  Result Value Ref Range Status   MRSA, PCR NEGATIVE NEGATIVE Final   Staphylococcus aureus NEGATIVE NEGATIVE Final    Comment: (NOTE) The Xpert SA Assay (FDA approved for NASAL specimens in patients 45 years of age and older), is one component of a comprehensive surveillance program. It is not intended to diagnose infection nor to guide or monitor treatment. Performed at [facility], [place] [address] [place], [place] [address]      Liver Function Tests: Recent Labs  Lab 01/29/18 0444 01/30/18 0430 01/31/18 0350 02/01/18 0540  AST 11* 10* 10* 10*  ALT 16 14 12 11   ALKPHOS 57 57 54 51  BILITOT 1.2 1.1 1.1 1.0  PROT 5.9* 5.7* 5.6*  5.8*  ALBUMIN 2.6* 2.8* 2.8* 2.9*   No results for input(s): LIPASE, AMYLASE in the last 168 hours. No results for input(s): AMMONIA in the last 168 hours.  Cardiac Enzymes: No results for input(s): CKTOTAL, CKMB, CKMBINDEX, TROPONINI in the last 168 hours. BNP (last 3 results) Recent Labs    01/26/18 1051  BNP 1,290.3*    ProBNP (last 3 results) No results for input(s): PROBNP in the last 8760 hours.    Studies: No results found.  Scheduled Meds: . sodium chloride   Intravenous Once  . carvedilol  12.5 mg Oral BID WC  . cholecalciferol  2,000 Units Oral Daily  . donepezil  10 mg Oral QHS  . ezetimibe  10 mg Oral QHS  . feeding supplement  1 Container Oral TID BM  . insulin aspart  0-5 Units Subcutaneous QHS  . insulin aspart  0-9 Units Subcutaneous TID WC  . insulin glargine  5 Units Subcutaneous QHS  . losartan  25 mg Oral Daily  . multivitamin with minerals  1 tablet Oral Daily  . pantoprazole  40 mg Oral BID  . polyethylene glycol  17 g Oral Daily  . rosuvastatin  10 mg Oral Daily  . tamsulosin  0.4 mg Oral QHS  . warfarin  7.5 mg Oral ONCE-1800  . Warfarin - Pharmacist Dosing Inpatient   Does not apply q1800      Time spent: 25  min  [facility] Pager [phone]. If 7PM-7AM, please contact night-coverage at [email or web address], Office  [phone]  password [email or web address]  02/04/2018, 11:51 AM  LOS: 10 days

## 2018-02-04 NOTE — Progress Notes (Signed)
Pt on CPAP for the night and tolerating well- family at bedside.

## 2018-02-04 NOTE — Progress Notes (Signed)
[facility]/Trauma Progress Note  5 Days Post-Op   Assessment/Plan Type 2 diabetes mellitus- SSI Atrial fibrillation- per primary team, on SQ heparin Chronic systolicCHF- last EF around 20%, management of fluid status per cardiology Hx of CABG Acute on ChronicAnemia - H/H 8.8/28.1, stable  Transverse colonadenocarcinoma - s/p laparoscopic assisted extended right hemicolectomy 01/30/18 Dr. Marlou Starks - POD#5 - drain output decreasing, serous, still >300 - soft diet as tolerated. - continue to mobilize - HCT down a bit and pt reports some blood in stool.  Will follow.  May need to hold anticoagulation if trend continues.    FEN: soft VTE:  heparin gtt and then transition to coumadin per primary  ID: [email or web address] periop Follow up: Dr. Marlou Starks  DISPO:    LOS: 10 days    Subjective: CC:   Pain tolerable.  Has been walking.  Has had a few stools with some blood staining in them.    Objective: Vital signs in last 24 hours: Temp:  [97.5 F (36.4 C)-97.9 F (36.6 C)] 97.9 F (36.6 C) (09/08 0540) Pulse Rate:  [59-60] 59 (09/08 0540) Resp:  [17] 17 (09/07 1630) BP: (106-125)/(48-55) 125/48 (09/08 0540) SpO2:  [96 %-100 %] 96 % (09/08 0540) Weight:  [100.6 kg] 100.6 kg (09/07 1630) Last BM Date: 02/03/18  Intake/Output from previous day: 09/07 0701 - 09/08 0700 In: 440 [P.O.:240; I.V.:200] Out: 1928 [Urine:1595; Drains:333] Intake/Output this shift: No intake/output data recorded.  PE: Gen:  Alert, NAD, pleasant, cooperative Pulm: breathing comfortably, reg rate Abd: Soft, NT/ND,  incisions C/D/I, drain with minimal serosang drainage in JP Skin: no rashes noted, warm and dry   Anti-infectives: Anti-infectives (From admission, onward)   Start     Dose/Rate Route Frequency Ordered Stop   01/30/18 1030  cefoTEtan (CEFOTAN) 2 g in sodium chloride 0.9 % 100 mL IVPB     2 g 200 mL/hr over 30 Minutes Intravenous To Surgery 01/30/18 1027 01/31/18 1608       Lab Results:  Recent Labs    02/03/18 0547 02/04/18 0531  WBC 5.2 4.3  HGB 8.3* 7.4*  HCT 27.1* 23.6*  PLT 158 155   BMET Recent Labs    02/02/18 0446 02/04/18 0531  NA 139 139  K 4.5 3.7  CL 102 103  CO2 30 28  GLUCOSE 196* 129*  BUN 13 11  CREATININE 1.30* 1.39*  CALCIUM 9.0 8.6*   PT/INR No results for input(s): LABPROT, INR in the last 72 hours. CMP     Component Value Date/Time   NA 139 02/04/2018 0531   NA 133 (L) 08/03/2013 0455   K 3.7 02/04/2018 0531   K 4.7 08/03/2013 0455   CL 103 02/04/2018 0531   CL 101 08/03/2013 0455   CO2 28 02/04/2018 0531   CO2 27 08/03/2013 0455   GLUCOSE 129 (H) 02/04/2018 0531   GLUCOSE 151 (H) 08/03/2013 0455   BUN 11 02/04/2018 0531   BUN 16 08/03/2013 0455   CREATININE 1.39 (H) 02/04/2018 0531   CREATININE 1.13 08/03/2013 0455   CALCIUM 8.6 (L) 02/04/2018 0531   CALCIUM 9.2 08/03/2013 0455   PROT 5.8 (L) 02/01/2018 0540   PROT 6.6 07/31/2013 0437   ALBUMIN 2.9 (L) 02/01/2018 0540   ALBUMIN 2.6 (L) 07/31/2013 0437   AST 10 (L) 02/01/2018 0540   AST 20 07/31/2013 0437   ALT 11 02/01/2018 0540   ALT 27 07/31/2013 0437   ALKPHOS 51 02/01/2018 0540   ALKPHOS 86 07/31/2013 0437  BILITOT 1.0 02/01/2018 0540   BILITOT 1.2 (H) 07/31/2013 0437   GFRNONAA 45 (L) 02/04/2018 0531   GFRNONAA >60 08/03/2013 0455   GFRAA 52 (L) 02/04/2018 0531   GFRAA >60 08/03/2013 0455   Lipase     Component Value Date/Time   LIPASE 47 01/25/2018 0508    Studies/Results: No results found.  Stark Klein, MD

## 2018-02-04 NOTE — Progress Notes (Signed)
Tele called r/t 5 run of v-tach. MD on-call given FYI. Patient asymptomatic.

## 2018-02-05 DIAGNOSIS — D62 Acute posthemorrhagic anemia: Secondary | ICD-10-CM

## 2018-02-05 LAB — BASIC METABOLIC PANEL
ANION GAP: 8 (ref 5–15)
BUN: 11 mg/dL (ref 8–23)
CALCIUM: 8.8 mg/dL — AB (ref 8.9–10.3)
CHLORIDE: 106 mmol/L (ref 98–111)
CO2: 28 mmol/L (ref 22–32)
CREATININE: 1.36 mg/dL — AB (ref 0.61–1.24)
GFR calc non Af Amer: 46 mL/min — ABNORMAL LOW (ref >60)
GFR, EST AFRICAN AMERICAN: 54 mL/min — AB (ref >60)
Glucose, Bld: 142 mg/dL — ABNORMAL HIGH (ref 70–99)
Potassium: 3.7 mmol/L (ref 3.5–5.1)
SODIUM: 142 mmol/L (ref 135–145)

## 2018-02-05 LAB — GLUCOSE, CAPILLARY
GLUCOSE-CAPILLARY: 237 mg/dL — AB (ref 70–99)
Glucose-Capillary: 142 mg/dL — ABNORMAL HIGH (ref 70–99)
Glucose-Capillary: 162 mg/dL — ABNORMAL HIGH (ref 70–99)
Glucose-Capillary: 148 mg/dL — ABNORMAL HIGH (ref 70–99)

## 2018-02-05 LAB — CBC
HCT: 24 % — ABNORMAL LOW (ref 39.0–52.0)
Hemoglobin: 7.4 g/dL — ABNORMAL LOW (ref 13.0–17.0)
MCH: 27.5 pg (ref 26.0–34.0)
MCHC: 30.8 g/dL (ref 30.0–36.0)
MCV: 89.2 fL (ref 78.0–100.0)
Platelets: 136 10*3/uL — ABNORMAL LOW (ref 150–400)
RBC: 2.69 MIL/uL — AB (ref 4.22–5.81)
RDW: 16.8 % — ABNORMAL HIGH (ref 11.5–15.5)
WBC: 4.2 10*3/uL (ref 4.0–10.5)

## 2018-02-05 LAB — PROTIME-INR
INR: 1.41
Prothrombin Time: 17.1 seconds — ABNORMAL HIGH (ref 11.4–15.2)

## 2018-02-05 LAB — HEPARIN LEVEL (UNFRACTIONATED): Heparin Unfractionated: <0.1 IU/mL — ABNORMAL LOW (ref 0.30–0.70)

## 2018-02-05 MED ORDER — GLUCERNA SHAKE PO LIQD
237.0000 mL | Freq: Three times a day (TID) | ORAL | Status: DC
  Administered 2018-02-05 – 2018-02-12 (×19): 237 mL via ORAL

## 2018-02-05 MED ORDER — ACETAMINOPHEN 500 MG PO TABS
1000.0000 mg | ORAL_TABLET | Freq: Three times a day (TID) | ORAL | Status: DC
  Administered 2018-02-05 – 2018-02-12 (×22): 1000 mg via ORAL
  Filled 2018-02-05 (×23): qty 2

## 2018-02-05 MED ORDER — METHOCARBAMOL 500 MG PO TABS
500.0000 mg | ORAL_TABLET | Freq: Three times a day (TID) | ORAL | Status: DC
  Administered 2018-02-05 – 2018-02-12 (×21): 500 mg via ORAL
  Filled 2018-02-05 (×22): qty 1

## 2018-02-05 NOTE — Progress Notes (Addendum)
[facility]  PROGRESS NOTE  Collan Schoenfeld VEH:[id number] DOB: 1933-01-27 DOA: 01/25/2018 PCP: Idelle Crouch, MD   Brief HPI:   84 year old male with a history of ischemic cardiomyopathy, status post ICD, atrial fibrillation status post ablation x2, CAD with history of CABG, chronic systolic heart failure, chronic atrial fibrillation on anticoagulation was initially admitted to [facility] regional hospital day on 01/21/2018 with acute onset of bright red blood per rectum.  He was given Kcentra to reverse anticoagulation and transfuse PRBC.  He underwent colonoscopy showing colon mass in the ascending colon that was biopsied showing adenocarcinoma of the colon.    He was seen by general surgery and was recommended resection.  Cardiology saw the patient cleared for surgery.  Family wanted to be transferred to [facility] for surgery given extensive history of heart disease.  Patient underwent laparoscopic-assisted extended right colectomy, cystoscopy with insertion of Foley catheter.  Subjective   Patient seen and examined, bleeding has improved since stopping heparin.   Assessment/Plan:     1. Status post laparoscopic right colectomy-patient was diagnosed to have adenocarcinoma of ascending colon.  Underwent laparoscopic-assisted extended right colectomy.  Patient diet has been advanced to regular diet as.  2. Acute blood loss anemia-secondary to rectal bleeding with adenocarcinoma, hemoglobin has dropped to 7.4 .  Has low iron stores, iron level of 17, U IBC in 194, TIBC 211, saturation ratio 8%.  EGD showed gastritis.  Follow hemoglobin and transfuse for hemoglobin less than 7.  3. Ischemic cardiomyopathy-EF of 20%, continue beta-blocker, ARB on hold.  Lasix was changed to 40 mg daily, IV fluids were changed to [facility] per cardiology.  Lasix was held yesterday also given gentle IV hydration with normal saline at 50 mill per hour.  Will discontinue IV fluids.  No signs of fluid overload.   Will restart Lasix at 40 mg p.o. Daily, check BMP in a.m.  Patient has V pacemaker, pacemaker interrogation as per cardiology.  4. Chronic atrial fibrillation-heart rate is controlled, continue Coreg 12.5 mg twice a day.  Coumadin is on hold, INR was reversed. General surgery had restarted heparin and Coumadin , which is now on hold due to bloody stools.  5. Chronic kidney disease stage III-creatinine stable, at baseline.  Today creatinine is 1.39.  Lasix was held yesterday as patient complained of dark urine.  Lasix has been restarted.  Will hold ARB  6. Hypertension-blood pressure stable, continue ARB, Coreg.  7. Mild dementia-continue Aricept 10 mg p.o. Daily  8. CAD with history of CABG-continue Coreg, rosuvastatin  9. Diabetes mellitus type 2-blood glucose is elevated in 200 range, patient is on D5 half-normal saline.  Continue Lantus 5 units subcu daily.  Sliding scale insulin with NovoLog.  10. Hypomagnesemia-replete, last magnesium was 2.0   CBG: Recent Labs  Lab 02/04/18 1152 02/04/18 1709 02/04/18 2124 02/05/18 0841 02/05/18 1256  GLUCAP 170* 210* 171* 142* 237*    CBC: Recent Labs  Lab 01/30/18 0430 01/31/18 0350 02/01/18 0540 02/02/18 0446 02/03/18 0547 02/04/18 0531 02/05/18 0703  WBC 6.0 4.9 7.2 6.6 5.2 4.3 4.2  NEUTROABS 2.9 4.0  --   --   --   --   --   HGB 8.7* 8.4* 8.7* 8.8* 8.3* 7.4* 7.4*  HCT 27.5* 26.9* 27.6* 28.1* 27.1* 23.6* 24.0*  MCV 87.0 89.1 87.3 87.8 88.3 88.4 89.2  PLT 167 149* 155 162 158 155 136*    Basic Metabolic Panel: Recent Labs  Lab 01/30/18 0430 01/31/18 0350 02/01/18  7654 02/02/18 0446 02/04/18 0531 02/05/18 0703  NA 139 140 141 139 139 142  K 4.4 4.7 4.2 4.5 3.7 3.7  CL 103 105 103 102 103 106  CO2 28 25 28 30 28 28   GLUCOSE 121* 263* 213* 196* 129* 142*  BUN 11 12 14 13 11 11   CREATININE 1.34* 1.36* 1.33* 1.30* 1.39* 1.36*  CALCIUM 8.9 8.5* 8.9 9.0 8.6* 8.8*  MG 2.0 2.0  --   --   --   --   PHOS 2.7 3.8  --   --    --   --      DVT prophylaxis: SCDs  Code Status: Full code  Family Communication: No family at bedside  Disposition Plan: likely home when medically ready for discharge   Consultants:  General surgery  Cardiology  Procedures:  Laparoscopic assisted right colectomy   Antibiotics:   Anti-infectives (From admission, onward)   Start     Dose/Rate Route Frequency Ordered Stop   01/30/18 1030  cefoTEtan (CEFOTAN) 2 g in sodium chloride 0.9 % 100 mL IVPB     2 g 200 mL/hr over 30 Minutes Intravenous To Surgery 01/30/18 1027 01/31/18 1608       Objective   Vitals:   02/04/18 1456 02/04/18 2021 02/05/18 0523 02/05/18 1030  BP: (!) 108/41 (!) 117/56 (!) 115/48 (!) 100/51  Pulse: (!) 59 62 60 79  Resp: 17 18 17    Temp: 65.0 F (36.4 C) 98.7 F (37.1 C) 98.4 F (36.9 C)   TempSrc: Oral Oral Oral   SpO2: 99% 100% 97% 99%  Weight:   100.1 kg   Height:        Intake/Output Summary (Last 24 hours) at 02/05/2018 1356 Last data filed at 02/05/2018 0525 Gross per 24 hour  Intake 530 ml  Output 1391 ml  Net -861 ml   Filed Weights   02/02/18 0620 02/03/18 1630 02/05/18 0523  Weight: 100.9 kg 100.6 kg 100.1 kg     Physical Examination:    Mouth: Oral mucosa is moist, no lesions on palate,  Neck: Supple, no deformities, masses, or tenderness Lungs: Normal respiratory effort, bilateral clear to auscultation, no crackles or wheezes.  Heart: Regular rate and rhythm, S1 and S2 normal, no murmurs, rubs auscultated Abdomen: BS normoactive,soft,nondistended,non-tender to palpation,no organomegaly Extremities: No pretibial edema, no erythema, no cyanosis, no clubbing Neuro : Alert and oriented to time, place and person, No focal deficits      Data Reviewed: I have personally reviewed following labs and imaging studies   Recent Results (from the past 240 hour(s))  Surgical pcr screen     Status: None   Collection Time: 01/30/18  5:19 AM  Result Value Ref Range  Status   MRSA, PCR NEGATIVE NEGATIVE Final   Staphylococcus aureus NEGATIVE NEGATIVE Final    Comment: (NOTE) The Xpert SA Assay (FDA approved for NASAL specimens in patients 21 years of age and older), is one component of a comprehensive surveillance program. It is not intended to diagnose infection nor to guide or monitor treatment. Performed at [facility], [place] [address], [place], [place] [address]      Liver Function Tests: Recent Labs  Lab 01/30/18 0430 01/31/18 0350 02/01/18 0540  AST 10* 10* 10*  ALT 14 12 11   ALKPHOS 57 54 51  BILITOT 1.1 1.1 1.0  PROT 5.7* 5.6* 5.8*  ALBUMIN 2.8* 2.8* 2.9*   No results for input(s): LIPASE, AMYLASE in the last  168 hours. No results for input(s): AMMONIA in the last 168 hours.  Cardiac Enzymes: No results for input(s): CKTOTAL, CKMB, CKMBINDEX, TROPONINI in the last 168 hours. BNP (last 3 results) Recent Labs    01/26/18 1051  BNP 1,290.3*    ProBNP (last 3 results) No results for input(s): PROBNP in the last 8760 hours.    Studies: No results found.  Scheduled Meds: . sodium chloride   Intravenous Once  . acetaminophen  1,000 mg Oral Q8H  . carvedilol  12.5 mg Oral BID WC  . cholecalciferol  2,000 Units Oral Daily  . donepezil  10 mg Oral QHS  . ezetimibe  10 mg Oral QHS  . feeding supplement (GLUCERNA SHAKE)  237 mL Oral TID BM  . furosemide  40 mg Oral Daily  . insulin aspart  0-5 Units Subcutaneous QHS  . insulin aspart  0-9 Units Subcutaneous TID WC  . insulin glargine  5 Units Subcutaneous QHS  . methocarbamol  500 mg Oral Q8H  . multivitamin with minerals  1 tablet Oral Daily  . pantoprazole  40 mg Oral BID  . polyethylene glycol  17 g Oral Daily  . rosuvastatin  10 mg Oral Daily  . tamsulosin  0.4 mg Oral QHS      Time spent: 25 min  [facility] Pager [phone]. If 7PM-7AM, please contact night-coverage at [email or web address], Office  [phone]  password  [email or web address]  02/05/2018, 1:56 PM  LOS: 11 days

## 2018-02-05 NOTE — Progress Notes (Signed)
Pt places self on/off cpap.  Rt will monitor. 

## 2018-02-05 NOTE — Progress Notes (Signed)
Progress Note  Patient Name: Luis Palmer Date of Encounter: 02/05/2018  Primary Cardiologist:  [facility]  Subjective   09/09: POD 6  Per surgery documentation, heparin off d/t bloody stools.  Patient reports last bloody stool x2 was yesterday afternoon. No BM yet today. He denies bloody nose, bleeding gums. No CP. Some SOB, intermittent. No n/v/d today. Appetite OK.  He has been using his spirometer and has abdominal soreness from this exercise.  Do not restart anticoagulation given the bloody stools, Hgb drop.  Inpatient Medications    Scheduled Meds: . sodium chloride   Intravenous Once  . acetaminophen  1,000 mg Oral Q8H  . carvedilol  12.5 mg Oral BID WC  . cholecalciferol  2,000 Units Oral Daily  . donepezil  10 mg Oral QHS  . ezetimibe  10 mg Oral QHS  . feeding supplement  1 Container Oral TID BM  . furosemide  40 mg Oral Daily  . insulin aspart  0-5 Units Subcutaneous QHS  . insulin aspart  0-9 Units Subcutaneous TID WC  . insulin glargine  5 Units Subcutaneous QHS  . losartan  25 mg Oral Daily  . methocarbamol  500 mg Oral Q8H  . multivitamin with minerals  1 tablet Oral Daily  . pantoprazole  40 mg Oral BID  . polyethylene glycol  17 g Oral Daily  . rosuvastatin  10 mg Oral Daily  . tamsulosin  0.4 mg Oral QHS   Continuous Infusions: . dextrose 5 % and 0.9 % NaCl with KCl 20 mEq/L 10 mL/hr at 02/02/18 1318  . lactated ringers Stopped (01/30/18 1336)   PRN Meds: ondansetron **OR** ondansetron (ZOFRAN) IV, oxyCODONE   Vital Signs    Vitals:   02/04/18 0540 02/04/18 1456 02/04/18 2021 02/05/18 0523  BP: (!) 125/48 (!) 108/41 (!) 117/56 (!) 115/48  Pulse: (!) 59 (!) 59 62 60  Resp:  17 18 17   Temp: 97.9 F (36.6 C) 97.6 F (36.4 C) 98.7 F (37.1 C) 98.4 F (36.9 C)  TempSrc: Oral Oral Oral Oral  SpO2: 96% 99% 100% 97%  Weight:    100.1 kg  Height:        Intake/Output Summary (Last 24 hours) at 02/05/2018 1023 Last data filed at  02/05/2018 0525 Gross per 24 hour  Intake 650 ml  Output 1851 ml  Net -1201 ml   Filed Weights   02/02/18 0620 02/03/18 1630 02/05/18 0523  Weight: 100.9 kg 100.6 kg 100.1 kg    Physical Exam   GEN: No acute distress.   Neck: No JVD Cardiac: IRIR, no murmurs, rubs, or gallops. (pacemaker) Respiratory: Clear to auscultation bilaterally. GI: POD 6, abdominal surgery  MS: SCDs, No edema; No deformity. Neuro:  Nonfocal  Psych: Normal affect   Labs    Chemistry Recent Labs  Lab 01/30/18 0430 01/31/18 0350 02/01/18 0540 02/02/18 0446 02/04/18 0531 02/05/18 0703  NA 139 140 141 139 139 142  K 4.4 4.7 4.2 4.5 3.7 3.7  CL 103 105 103 102 103 106  CO2 28 25 28 30 28 28   GLUCOSE 121* 263* 213* 196* 129* 142*  BUN 11 12 14 13 11 11   CREATININE 1.34* 1.36* 1.33* 1.30* 1.39* 1.36*  CALCIUM 8.9 8.5* 8.9 9.0 8.6* 8.8*  PROT 5.7* 5.6* 5.8*  --   --   --   ALBUMIN 2.8* 2.8* 2.9*  --   --   --   AST 10* 10* 10*  --   --   --  ALT 14 12 11   --   --   --   ALKPHOS 57 54 51  --   --   --   BILITOT 1.1 1.1 1.0  --   --   --   GFRNONAA 47* 46* 48* 49* 45* 46*  GFRAA 55* 54* 55* 57* 52* 54*  ANIONGAP 8 10 10 7 8 8      Hematology Recent Labs  Lab 02/03/18 0547 02/04/18 0531 02/05/18 0703  WBC 5.2 4.3 4.2  RBC 3.07* 2.67* 2.69*  HGB 8.3* 7.4* 7.4*  HCT 27.1* 23.6* 24.0*  MCV 88.3 88.4 89.2  MCH 27.0 27.7 27.5  MCHC 30.6 31.4 30.8  RDW 16.5* 16.6* 16.8*  PLT 158 155 136*    Cardiac EnzymesNo results for input(s): TROPONINI in the last 168 hours. No results for input(s): TROPIPOC in the last 168 hours.   BNPNo results for input(s): BNP, PROBNP in the last 168 hours.   DDimer No results for input(s): DDIMER in the last 168 hours.   Radiology    No results found.  Cardiac Studies   01/24/2018 TTE - Left ventricle: The cavity size was severely dilated. Systolic   function was severely reduced. The estimated ejection fraction   was 20%. Diffuse hypokinesis. -  Aortic valve: Valve area (Vmax): 2.16 cm^2. - Mitral valve: There was mild to moderate regurgitation. - Left atrium: The atrium was mildly dilated. - Right ventricle: The cavity size was mildly dilated. - Right atrium: The atrium was mildly dilated. - Tricuspid valve: There was mild-moderate regurgitation.  Patient Profile     84 y.o. male male with a history of known chronic systolic congestive heart failure, ICM s/p ICD, Chronic Afib s/p ablation x2, MDT VIVA XT CRT-D,  coronary artery disease-status post coronary artery bypass grafting.  He did was admitted 08/29 with GI bleed. Colonoscopy showed mass; colectomy 01/30/18; pathology showed adenocarcinoma.   Assessment & Plan    Chronic Atrial Fibrillation - Carvedilol for rate control. Continue. HR 79 bpm, ventricularly paced. - Heparin on hold this AM d/t bloody stools. Continue to hold. Continue SCDs in place of heparin. - K 4.7  4.2  4.5  3.7 - Hgb drop 8.7  8.8  8.3  7.4 - Continue daily CBC, BMP. - Do not restart anticoagulation  Chronic Systolic Congestive Heart Failure, ICM, and CAD s/p CABG - Most recent echo as above in cardiac studies with EF 20%. - Some SOB, in setting of Hgb drop. - Euvolemic. Continue Lasix 40mg  po daily. - Continue Carvedilol, Zetia, Losartan, Statin therapy. - Pacemaker, ventricular pacing. - Strict I/O, daily weights, volume status. - As above, do not restart anticoagulation  CKD III - Cr 1.36  1.33  1.30  1.39  1.36 - Lasix 40mg  IV q 12h, Losartan. Continue. - Continue to monitor renal function, electrolytes with BMP  HTN - BP 100/51 - patient hypotensive.  - Continue Losartan, Coreg. - Continue to monitor vitals, renal function.  DM2 - SSI - Per IM  Adenocarcinoma s/p resection of tumor and associated acute blood loss anemia - Blood loss secondary to rectal bleeding with adenicarcinoma; EGD showed gastritis - Hemoglobin as above shows drop - continue to monitor and transfuse for Hgb  <7 - Do not restart anticoagulation - Continue to monitor for bloody stool - Per General Surgery      For questions or updates, please contact [place] Please consult [email or web address] for contact info under        Signed,  Arvil Chaco, PA-C  02/05/2018, 10:23 AM

## 2018-02-05 NOTE — Progress Notes (Signed)
Nutrition Follow-up  DOCUMENTATION CODES:   Not applicable  INTERVENTION:   -D/c Boost Breeze po TID, each supplement provides 250 kcal and 9 grams of protein -Glucerna Shake po TID, each supplement provides 220 kcal and 10 grams of protein -Continue MVI with minerals daily  NUTRITION DIAGNOSIS:   Increased nutrient needs related to post-op healing as evidenced by estimated needs.  Ongoing  GOAL:   Patient will meet greater than or equal to 90% of their needs  Progressing  MONITOR:   PO intake, Supplement acceptance, Diet advancement, Labs, Weight trends, Skin, I & O's  REASON FOR ASSESSMENT:   Consult Assessment of nutrition requirement/status  ASSESSMENT:   Luis Palmer is a 84 y.o. male w/ hx of severe ICM sp ICD, afib ablation x 2, CAD sp CABG, chron systolic CHF, chron afib admitted to [facility] on 01/21/18 w/ BRBPR of acute onset.  9/3- s/p lap assisted extended rt colectomy and cystoscopy flexible with insertion of catheter 9/6- advanced to full liquid diet 9/7- advanced to soft diet 9/9- advanced to regular diet  Spoke with pt and wife at bedside. Pt in good spirits today, using IS at time of visit. Pt and wife report that pt has a good appetite and was consuming 3 meals per day PTA. Pt usually consumes a heavy breakfast and a light lunch.   Pt consumed 75% of breakfast tray this morning ("the PA got on me"). Per pt, this is the first day he has really had much of an appetite. Noted meal completion 50-100%.   Per pt wife, pt lost weight intentionally over the past year. CBGS are generally well controlled at home, however, expressed concern over elevated blood sugars due to Boost Breeze supplements. Pt wife reports pt consumes Premier Protein supplements at home (which are not available on hospital formulary), however, is amenable to Glucerna supplements to better manage blood sugars.   Discussed with pt and wife importance of good meal and supplement  intake to promote healing.   Labs reviewed: CBGS: [id number] (inpatient orders for glycemic control are 0-9 units insulin aspart TID with meals, and 0-5 units insulin aspart q HS, and 5 units insulin aspart q HS).   NUTRITION - FOCUSED PHYSICAL EXAM:    Most Recent Value  Orbital Region  No depletion  Upper Arm Region  No depletion  Thoracic and Lumbar Region  No depletion  Buccal Region  No depletion  Temple Region  No depletion  Clavicle Bone Region  No depletion  Clavicle and Acromion Bone Region  No depletion  Scapular Bone Region  No depletion  Dorsal Hand  No depletion  Patellar Region  No depletion  Anterior Thigh Region  No depletion  Posterior Calf Region  No depletion  Edema (RD Assessment)  None  Hair  Reviewed  Eyes  Reviewed  Mouth  Reviewed  Skin  Reviewed  Nails  Reviewed       Diet Order:   Diet Order            Diet regular Room service appropriate? Yes; Fluid consistency: Thin  Diet effective now              EDUCATION NEEDS:   No education needs have been identified at this time  Skin:  Skin Assessment: Skin Integrity Issues: Skin Integrity Issues:: Incisions Incisions: closed abdominal incision  Last BM:  02/04/18  Height:   Ht Readings from Last 1 Encounters:  01/30/18 6' (1.829 m)    Weight:  Wt Readings from Last 1 Encounters:  02/05/18 100.1 kg    Ideal Body Weight:  80.9 kg  BMI:  Body mass index is 29.93 kg/m.  Estimated Nutritional Needs:   Kcal:  2200-2400  Protein:  105-120 grams  Fluid:  >2.2 L    Sanjna Haskew A. Jimmye Norman, RD, LDN, CDE Pager: [phone] After hours Pager: [phone]

## 2018-02-05 NOTE — Progress Notes (Signed)
Physical Therapy Treatment Patient Details Name: Luis Palmer MRN: [id number] DOB: 17-Jul-1932 Today's Date: 02/05/2018    History of Present Illness Pt is an 84 y/o male admitted to [facility] on 01/21/18 w/ BRBPR of acute onset and transferred to [facility] for GI bleed. 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 Marlou Starks. PMH including but not limited to DM, severe ICM sp ICD, afib ablation x 2, CAD sp CABG, chron systolic CHF and chronic a-fib.    PT Comments    Pt reports he had ambulated already 2 times, therefore did not want to walk with PT. Was agreeable to perform seated HEP. Educated and reviewed importance of mobility, incentive spirometry use, and performance of HEP. Discussed current recommendations with pt and family and pt and family agreeable. Will continue to follow acutely to maximize functional mobility independence and safety.    Follow Up Recommendations  Home health PT;Supervision - Intermittent     Equipment Recommendations  Rolling walker with 5" wheels    Recommendations for Other Services       Precautions / Restrictions Precautions Precautions: Fall Restrictions Weight Bearing Restrictions: No    Mobility  Bed Mobility               General bed mobility comments: In chair upon entry. Refusing ambulation as he reports he has abulated 2X already and planned to ambulate with his wife later in the afternoon.   Transfers                    Ambulation/Gait                 Stairs             Wheelchair Mobility    Modified Rankin (Stroke Patients Only)       Balance                                            Cognition Arousal/Alertness: Awake/alert Behavior During Therapy: WFL for tasks assessed/performed Overall Cognitive Status: Within Functional Limits for tasks assessed                                         Exercises General Exercises - Lower Extremity Long Arc Quad: AROM;Both;10 reps;Seated Hip ABduction/ADduction: AROM;Both;10 reps;Seated(hip adduction squeezes with 30 sec hold using pillow) Hip Flexion/Marching: AROM;Both;10 reps;Seated Toe Raises: AROM;Both;20 reps;Seated Heel Raises: AROM;Both;20 reps;Seated Other Exercises Other Exercises: Educated about incentive spirometer use. Pt able to recall appropriate frequency.  Other Exercises: Educated about shoulder flexion exercise to help with chest wall/abdominal expansion.     General Comments General comments (skin integrity, edema, etc.): Pt's wife present during session.       Pertinent Vitals/Pain Pain Assessment: Faces Faces Pain Scale: Hurts little more Pain Location: abdomen Pain Descriptors / Indicators: Sore Pain Intervention(s): Limited activity within patient's tolerance;Monitored during session;Repositioned    Home Living                      Prior Function            PT Goals (current goals can now be found in the care plan section) Acute Rehab PT Goals Patient Stated Goal: to be  able to sleep better  PT Goal Formulation: With patient/family Time For Goal Achievement: 02/16/18 Potential to Achieve Goals: Good Progress towards PT goals: Progressing toward goals    Frequency    Min 3X/week      PT Plan Current plan remains appropriate    Co-evaluation              AM-PAC PT "6 Clicks" Daily Activity  Outcome Measure  Difficulty turning over in bed (including adjusting bedclothes, sheets and blankets)?: None Difficulty moving from lying on back to sitting on the side of the bed? : None Difficulty sitting down on and standing up from a chair with arms (e.g., wheelchair, bedside commode, etc,.)?: Unable Help needed moving to and from a bed to chair (including a wheelchair)?: None Help needed walking in hospital room?: A Little Help needed climbing 3-5 steps with a railing? : A  Little 6 Click Score: 19    End of Session   Activity Tolerance: Patient tolerated treatment well Patient left: in chair;with call bell/phone within reach;with family/visitor present Nurse Communication: Mobility status PT Visit Diagnosis: Other abnormalities of gait and mobility (R26.89)     Time: 5747-3403 PT Time Calculation (min) (ACUTE ONLY): 13 min  Charges:  $Therapeutic Exercise: 8-22 mins                     Leighton Ruff, PT, DPT  Acute Rehabilitation Services  Pager: [phone] Office: [phone]    Rudean Hitt 02/05/2018, 1:51 PM

## 2018-02-05 NOTE — Progress Notes (Signed)
6 Days Post-Op    CC: GI bleed  Subjective: He is doing OK, does not get OOB, not eating much, no appetite, he is taking nothing for pain.  He is tolerating PO's OK, BM with blood again, labs are pending this AM.  Heparin is off this AM due to bloody stools.  Objective: Vital signs in last 24 hours: Temp:  [97.6 F (36.4 C)-98.7 F (37.1 C)] 98.4 F (36.9 C) (09/09 0523) Pulse Rate:  [59-62] 60 (09/09 0523) Resp:  [17-18] 17 (09/09 0523) BP: (108-117)/(41-56) 115/48 (09/09 0523) SpO2:  [97 %-100 %] 97 % (09/09 0523) Weight:  [100.1 kg] 100.1 kg (09/09 0523) Last BM Date: 02/04/18 650 PO 1600 urine Stool x 1 Afebrile, VSS; blood pressure on the low side - weight is stable No labs this AM   Intake/Output from previous day: 09/08 0701 - 09/09 0700 In: 650 [P.O.:650] Out: 1851 [Urine:1600; Drains:250; Stool:1] Intake/Output this shift: No intake/output data recorded.  General appearance: alert, cooperative and no distress Resp: clear to auscultation bilaterally Cardio: regular, no tachycardia, GI: Midline incision is healing well, Drain is serosanguinous, + BS, tolerating diet and having BM's.  Lab Results:  Recent Labs    02/03/18 0547 02/04/18 0531  WBC 5.2 4.3  HGB 8.3* 7.4*  HCT 27.1* 23.6*  PLT 158 155    BMET Recent Labs    02/04/18 0531  NA 139  K 3.7  CL 103  CO2 28  GLUCOSE 129*  BUN 11  CREATININE 1.39*  CALCIUM 8.6*   PT/INR No results for input(s): LABPROT, INR in the last 72 hours.  Recent Labs  Lab 01/30/18 0430 01/31/18 0350 02/01/18 0540  AST 10* 10* 10*  ALT 14 12 11   ALKPHOS 57 54 51  BILITOT 1.1 1.1 1.0  PROT 5.7* 5.6* 5.8*  ALBUMIN 2.8* 2.8* 2.9*     Lipase     Component Value Date/Time   LIPASE 47 01/25/2018 0508   Prior to Admission medications   Medication Sig Start Date End Date Taking? Authorizing Provider  betamethasone dipropionate 0.05 % lotion Apply 1 application topically daily as needed for itching or dry  skin. 12/26/17  Yes [provider]  carvedilol (COREG) 12.5 MG tablet Take 12.5 mg by mouth 2 (two) times daily.   Yes [provider]  Cholecalciferol (VITAMIN D) 2000 UNITS CAPS Take 1 capsule by mouth daily.   Yes [provider]  donepezil (ARICEPT) 10 MG tablet Take 10 mg by mouth at bedtime.   Yes [provider]  ezetimibe (ZETIA) 10 MG tablet Take 10 mg by mouth at bedtime.   Yes [provider]  furosemide (LASIX) 40 MG tablet Take 40 mg by mouth daily.   Yes [provider]  Iodoquinol-HC-Aloe Polysacch (ALCORTIN A) 1-2-1 % GEL Apply 1 application topically as needed for dry skin. 12/30/17  Yes [provider]  losartan (COZAAR) 25 MG tablet Take 25 mg by mouth daily. 01/04/18  Yes [provider]  meclizine (ANTIVERT) 25 MG tablet Take 25 mg by mouth daily.    Yes [provider]  pantoprazole (PROTONIX) 40 MG tablet Take 1 tablet (40 mg total) by mouth 2 (two) times daily. 01/25/18  Yes Dustin Flock, MD  rosuvastatin (CRESTOR) 10 MG tablet Take 10 mg by mouth daily. 12/30/17  Yes [provider]      Medications: . sodium chloride   Intravenous Once  . carvedilol  12.5 mg Oral BID WC  .  cholecalciferol  2,000 Units Oral Daily  . donepezil  10 mg Oral QHS  . ezetimibe  10 mg Oral QHS  . feeding supplement  1 Container Oral TID BM  . furosemide  40 mg Oral Daily  . insulin aspart  0-5 Units Subcutaneous QHS  . insulin aspart  0-9 Units Subcutaneous TID WC  . insulin glargine  5 Units Subcutaneous QHS  . losartan  25 mg Oral Daily  . multivitamin with minerals  1 tablet Oral Daily  . pantoprazole  40 mg Oral BID  . polyethylene glycol  17 g Oral Daily  . rosuvastatin  10 mg Oral Daily  . tamsulosin  0.4 mg Oral QHS  . Warfarin - Pharmacist Dosing Inpatient   Does not apply q1800   . dextrose 5 % and 0.9 % NaCl with KCl 20 mEq/L 10 mL/hr at 02/02/18 1318  . lactated ringers Stopped  (01/30/18 1336)   Anti-infectives (From admission, onward)   Start     Dose/Rate Route Frequency Ordered Stop   01/30/18 1030  cefoTEtan (CEFOTAN) 2 g in sodium chloride 0.9 % 100 mL IVPB     2 g 200 mL/hr over 30 Minutes Intravenous To Surgery 01/30/18 1027 01/31/18 1608      Assessment/Plan Type 2 diabetes mellitus- SSI Atrial fibrillation- per primary team, on SQ heparin Chronic systolicCHF- last EF around 20%, management of fluid status per cardiology Hx of CABG Acute on ChronicAnemia - H/H 8.8/28.1, >> 7.4/23.6 Tobacco use x 40 years/none x 33 years Hx of mild dementia OSA/CPAP   GI bleed withTransverse colonadenocarcinoma - s/p laparoscopic assisted extended right hemicolectomy 01/30/18 Dr. Toth//Cystoscopy with foley placement Dr. Alexis Frock - POD#6 - drainoutput decreasing, serous, still >300 - soft diet as tolerated. - continue to mobilize - HCT down a bit and pt reports some blood in stool.  Will follow.  May need to hold anticoagulation if trend continues.    ULA:GTXM diet VTE: heparin gtt and then transition to coumadin per primary  ID: cefotetan periop Follow up: Dr. Marlou Starks  PlAN:  Labs are pending, Heparin is currently on hold.  I would decrease his Cozaar and work to bring up his BP, but will defer to Medicine for this.  I will put him on a regular diet, ask Nutrition to assist with PO's, Boost is running his glucose up. He needs to move more and I am gong to put him on some scheduled Tylenol and Robaxin. Check orthostatics, PT/OT eval for home.  Await labs.      LOS: 11 days    Anabella Capshaw 02/05/2018 [phone]

## 2018-02-06 DIAGNOSIS — I951 Orthostatic hypotension: Secondary | ICD-10-CM

## 2018-02-06 LAB — CBC
HEMATOCRIT: 24.6 % — AB (ref 39.0–52.0)
HEMOGLOBIN: 7.6 g/dL — AB (ref 13.0–17.0)
MCH: 27.6 pg (ref 26.0–34.0)
MCHC: 30.9 g/dL (ref 30.0–36.0)
MCV: 89.5 fL (ref 78.0–100.0)
Platelets: 147 10*3/uL — ABNORMAL LOW (ref 150–400)
RBC: 2.75 MIL/uL — AB (ref 4.22–5.81)
RDW: 17.1 % — ABNORMAL HIGH (ref 11.5–15.5)
WBC: 8.7 10*3/uL (ref 4.0–10.5)

## 2018-02-06 LAB — GLUCOSE, CAPILLARY
GLUCOSE-CAPILLARY: 160 mg/dL — AB (ref 70–99)
Glucose-Capillary: 120 mg/dL — ABNORMAL HIGH (ref 70–99)
Glucose-Capillary: 193 mg/dL — ABNORMAL HIGH (ref 70–99)
Glucose-Capillary: 201 mg/dL — ABNORMAL HIGH (ref 70–99)

## 2018-02-06 LAB — BASIC METABOLIC PANEL
ANION GAP: 9 (ref 5–15)
BUN: 13 mg/dL (ref 8–23)
CALCIUM: 8.8 mg/dL — AB (ref 8.9–10.3)
CO2: 26 mmol/L (ref 22–32)
Chloride: 104 mmol/L (ref 98–111)
Creatinine, Ser: 1.48 mg/dL — ABNORMAL HIGH (ref 0.61–1.24)
GFR calc non Af Amer: 42 mL/min — ABNORMAL LOW (ref >60)
GFR, EST AFRICAN AMERICAN: 49 mL/min — AB (ref >60)
Glucose, Bld: 127 mg/dL — ABNORMAL HIGH (ref 70–99)
POTASSIUM: 3.2 mmol/L — AB (ref 3.5–5.1)
Sodium: 139 mmol/L (ref 135–145)

## 2018-02-06 LAB — PROTIME-INR
INR: 1.45
Prothrombin Time: 17.5 seconds — ABNORMAL HIGH (ref 11.4–15.2)

## 2018-02-06 LAB — MAGNESIUM: Magnesium: 1.8 mg/dL (ref 1.7–2.4)

## 2018-02-06 MED ORDER — POTASSIUM CHLORIDE CRYS ER 20 MEQ PO TBCR
20.0000 meq | EXTENDED_RELEASE_TABLET | Freq: Two times a day (BID) | ORAL | Status: DC
  Administered 2018-02-06: 20 meq via ORAL
  Filled 2018-02-06: qty 1

## 2018-02-06 MED ORDER — INFLUENZA VAC SPLIT HIGH-DOSE 0.5 ML IM SUSY
0.5000 mL | PREFILLED_SYRINGE | INTRAMUSCULAR | Status: AC
  Administered 2018-02-08: 0.5 mL via INTRAMUSCULAR
  Filled 2018-02-06: qty 0.5

## 2018-02-06 MED ORDER — POTASSIUM CHLORIDE CRYS ER 20 MEQ PO TBCR
40.0000 meq | EXTENDED_RELEASE_TABLET | Freq: Two times a day (BID) | ORAL | Status: AC
  Administered 2018-02-06 – 2018-02-07 (×2): 40 meq via ORAL
  Filled 2018-02-06 (×2): qty 2

## 2018-02-06 NOTE — Progress Notes (Signed)
Progress Note  Patient Name: Luis Palmer Date of Encounter: 02/06/2018  Primary Cardiologist:  [facility]  Subjective   Doing well this AM. Had stools with less blood, but has had some hypotension with ~48 point systolic drop from lying to standing. No chest pain or shortness of breath. Generally tired/fatigued.  Inpatient Medications    Scheduled Meds: . sodium chloride   Intravenous Once  . acetaminophen  1,000 mg Oral Q8H  . carvedilol  12.5 mg Oral BID WC  . cholecalciferol  2,000 Units Oral Daily  . donepezil  10 mg Oral QHS  . ezetimibe  10 mg Oral QHS  . feeding supplement (GLUCERNA SHAKE)  237 mL Oral TID BM  . furosemide  40 mg Oral Daily  . insulin aspart  0-5 Units Subcutaneous QHS  . insulin aspart  0-9 Units Subcutaneous TID WC  . insulin glargine  5 Units Subcutaneous QHS  . methocarbamol  500 mg Oral Q8H  . multivitamin with minerals  1 tablet Oral Daily  . pantoprazole  40 mg Oral BID  . polyethylene glycol  17 g Oral Daily  . potassium chloride  20 mEq Oral BID  . rosuvastatin  10 mg Oral Daily  . tamsulosin  0.4 mg Oral QHS   Continuous Infusions: . dextrose 5 % and 0.9 % NaCl with KCl 20 mEq/L 10 mL/hr at 02/02/18 1318  . lactated ringers Stopped (01/30/18 1336)   PRN Meds: ondansetron **OR** ondansetron (ZOFRAN) IV, oxyCODONE   Vital Signs    Vitals:   02/06/18 0900 02/06/18 0901 02/06/18 0904 02/06/18 0905  BP: (!) 100/48 (!) 94/42 (!) 75/33 (!) 74/44  Pulse: (!) 59     Resp:      Temp: 98.2 F (36.8 C)     TempSrc: Oral     SpO2:  99%    Weight:      Height:        Intake/Output Summary (Last 24 hours) at 02/06/2018 1035 Last data filed at 02/06/2018 0529 Gross per 24 hour  Intake 480 ml  Output 100 ml  Net 380 ml   Filed Weights   02/03/18 1630 02/05/18 0523 02/06/18 0529  Weight: 100.6 kg 100.1 kg 100.8 kg    Physical Exam   GEN: No acute distress.   Neck: No JVD Cardiac: IRIR, no murmurs, rubs, or  gallops. Respiratory: Clear to auscultation bilaterally. GI: drain with serosanguinous output, s/p abdominal surgery  MS: No edema; No deformity. Neuro:  Nonfocal  Psych: Normal affect   Labs    Chemistry Recent Labs  Lab 01/31/18 0350 02/01/18 0540  02/04/18 0531 02/05/18 0703 02/06/18 0617  NA 140 141   < > 139 142 139  K 4.7 4.2   < > 3.7 3.7 3.2*  CL 105 103   < > 103 106 104  CO2 25 28   < > 28 28 26   GLUCOSE 263* 213*   < > 129* 142* 127*  BUN 12 14   < > 11 11 13   CREATININE 1.36* 1.33*   < > 1.39* 1.36* 1.48*  CALCIUM 8.5* 8.9   < > 8.6* 8.8* 8.8*  PROT 5.6* 5.8*  --   --   --   --   ALBUMIN 2.8* 2.9*  --   --   --   --   AST 10* 10*  --   --   --   --   ALT 12 11  --   --   --   --  ALKPHOS 54 51  --   --   --   --   BILITOT 1.1 1.0  --   --   --   --   GFRNONAA 46* 48*   < > 45* 46* 42*  GFRAA 54* 55*   < > 52* 54* 49*  ANIONGAP 10 10   < > 8 8 9    < > = values in this interval not displayed.     Hematology Recent Labs  Lab 02/04/18 0531 02/05/18 0703 02/06/18 0617  WBC 4.3 4.2 8.7  RBC 2.67* 2.69* 2.75*  HGB 7.4* 7.4* 7.6*  HCT 23.6* 24.0* 24.6*  MCV 88.4 89.2 89.5  MCH 27.7 27.5 27.6  MCHC 31.4 30.8 30.9  RDW 16.6* 16.8* 17.1*  PLT 155 136* 147*    Cardiac EnzymesNo results for input(s): TROPONINI in the last 168 hours. No results for input(s): TROPIPOC in the last 168 hours.   BNPNo results for input(s): BNP, PROBNP in the last 168 hours.   DDimer No results for input(s): DDIMER in the last 168 hours.   Radiology    No results found.  Cardiac Studies   01/24/2018 TTE - Left ventricle: The cavity size was severely dilated. Systolic   function was severely reduced. The estimated ejection fraction   was 20%. Diffuse hypokinesis. - Aortic valve: Valve area (Vmax): 2.16 cm^2. - Mitral valve: There was mild to moderate regurgitation. - Left atrium: The atrium was mildly dilated. - Right ventricle: The cavity size was mildly dilated. -  Right atrium: The atrium was mildly dilated. - Tricuspid valve: There was mild-moderate regurgitation.  Patient Profile     84 y.o. male male with a history of known chronic systolic congestive heart failure, ICM s/p ICD, Chronic Afib s/p ablation x2, MDT VIVA XT CRT-D,  coronary artery disease-status post coronary artery bypass grafting.  He did was admitted 08/29 with GI bleed. Colonoscopy showed mass; colectomy 01/30/18; pathology showed adenocarcinoma.   Assessment & Plan    Chronic Atrial Fibrillation - Carvedilol for rate control. Continue. Has CRT-D. - Heparin on hold this AM d/t bloody stools. Continue to hold. Continue SCDs in place of heparin. - K 3.2 today, would supplement - Hgb 7.6 today, consistent with post surgical blood loss anemia.  - Continue daily CBC, BMP. - Do not restart anticoagulation from cardiac standpoint given concern for continued serosanguinous drain output and periodic blood in stool.  Chronic Systolic Congestive Heart Failure, ICM, and CAD s/p CABG - Most recent echo as above in cardiac studies with EF 20%. - appears euvolemic but orthostatic on BP today. Holding lasix today. Encourage PO intake, if remains orthostatic could give 500 ml IV fluids back. Also hold losartan - Continue Carvedilol, Zetia, Statin therapy. - CDT-D in place. - Strict I/O, daily weights, volume status. - As above, do not restart anticoagulation  CKD III - Cr up slightly today, as per above may be slightly dehydrated. - holding lasix and losartan - Continue to monitor renal function, electrolytes with BMP. Aim for K at 4.  HTN - has orthostatic hypotension today, treat as above.  DM2 - SSI - Per IM  Adenocarcinoma s/p resection of tumor and associated acute blood loss anemia - Blood loss secondary to rectal bleeding with adenicarcinoma; EGD showed gastritis - Hemoglobin as above shows drop - continue to monitor and transfuse for Hgb <7 - Do not restart anticoagulation -  Continue to monitor for bloody stool - Per General Surgery  For questions or updates, please contact [place] Please consult [email or web address] for contact info under     Signed, Buford Dresser, MD  02/06/2018, 10:35 AM

## 2018-02-06 NOTE — Care Management Important Message (Signed)
Important Message  Patient Details  Name: Luis Palmer MRN: [id number] Date of Birth: [birth year]   Medicare Important Message Given:  Yes    Orbie Pyo 02/06/2018, 2:17 PM

## 2018-02-06 NOTE — Progress Notes (Addendum)
[facility]  PROGRESS NOTE  Luis Palmer KJZ:[id number] DOB: 04-07-1933 DOA: 01/25/2018 PCP: Idelle Crouch, MD   Brief HPI:   84 year old male with a history of ischemic cardiomyopathy, status post ICD, atrial fibrillation status post ablation x2, CAD with history of CABG, chronic systolic heart failure, chronic atrial fibrillation on anticoagulation was initially admitted to [facility] regional hospital day on 01/21/2018 with acute onset of bright red blood per rectum.  He was given Kcentra to reverse anticoagulation and transfuse PRBC.  He underwent colonoscopy showing colon mass in the ascending colon that was biopsied showing adenocarcinoma of the colon.    He was seen by general surgery and was recommended resection.  Cardiology saw the patient cleared for surgery.  Family wanted to be transferred to [facility] for surgery given extensive history of heart disease.  Patient underwent laparoscopic-assisted extended right colectomy, cystoscopy with insertion of Foley catheter.  Subjective   Patient seen and examined, bleeding has stopped in the stool.  Heparin has been discussed   Assessment/Plan:     1. Status post laparoscopic right colectomy-patient was diagnosed to have adenocarcinoma of ascending colon.  Underwent laparoscopic-assisted extended right colectomy.  Patient diet has been advanced to regular diet .  2. Acute blood loss anemia-secondary to rectal bleeding with adenocarcinoma, hemoglobin has dropped to 7.4 .  Has low iron stores, iron level of 17, U IBC in 194, TIBC 211, saturation ratio 8%.  EGD showed gastritis.  Follow hemoglobin and transfuse for hemoglobin less than 7.  3. Ischemic cardiomyopathy-EF of 20%, continue beta-blocker, ARB on hold.  Lasix was changed to 40 mg daily, IV fluids were changed to [facility] per cardiology.  Lasix was held yesterday also given gentle IV hydration with normal saline at 50 mill per hour.  Will discontinue IV fluids.  No signs of  fluid overload.  Will restart Lasix at 40 mg p.o. Daily, check BMP in a.m.  Patient has V pacemaker, pacemaker interrogation as per cardiology.  4. Chronic atrial fibrillation-heart rate is controlled, continue Coreg 12.5 mg twice a day.  Coumadin is on hold, INR was reversed. General surgery had restarted heparin and Coumadin , which is now on hold due to bloody stools.  5. Chronic kidney disease stage III-creatinine stable, at baseline.  Today creatinine is 1.39.  Lasix was held yesterday as patient complained of dark urine.  Lasix has been restarted.  ARB on hold  6. Hypertension-blood pressure stable, continue ARB, Coreg.  7. Mild dementia-continue Aricept 10 mg p.o. Daily  8. CAD with history of CABG-continue Coreg, rosuvastatin  9. Diabetes mellitus type 2-blood glucose is controlled, sliding scale insulin with NovoLog.  10. Hypokalemia-potassium 3.2, replace potassium and check BMP in a.m.   CBG: Recent Labs  Lab 02/05/18 1256 02/05/18 1839 02/05/18 2201 02/06/18 0822 02/06/18 1224  GLUCAP 237* 162* 148* 120* 193*    CBC: Recent Labs  Lab 01/31/18 0350  02/02/18 0446 02/03/18 0547 02/04/18 0531 02/05/18 0703 02/06/18 0617  WBC 4.9   < > 6.6 5.2 4.3 4.2 8.7  NEUTROABS 4.0  --   --   --   --   --   --   HGB 8.4*   < > 8.8* 8.3* 7.4* 7.4* 7.6*  HCT 26.9*   < > 28.1* 27.1* 23.6* 24.0* 24.6*  MCV 89.1   < > 87.8 88.3 88.4 89.2 89.5  PLT 149*   < > 162 158 155 136* 147*   < > = values in  this interval not displayed.    Basic Metabolic Panel: Recent Labs  Lab 01/31/18 0350 02/01/18 0540 02/02/18 0446 02/04/18 0531 02/05/18 0703 02/06/18 0617  NA 140 141 139 139 142 139  K 4.7 4.2 4.5 3.7 3.7 3.2*  CL 105 103 102 103 106 104  CO2 25 28 30 28 28 26   GLUCOSE 263* 213* 196* 129* 142* 127*  BUN 12 14 13 11 11 13   CREATININE 1.36* 1.33* 1.30* 1.39* 1.36* 1.48*  CALCIUM 8.5* 8.9 9.0 8.6* 8.8* 8.8*  MG 2.0  --   --   --   --  1.8  PHOS 3.8  --   --   --   --    --      DVT prophylaxis: SCDs  Code Status: Full code  Family Communication: No family at bedside  Disposition Plan: likely home when medically ready for discharge   Consultants:  General surgery  Cardiology  Procedures:  Laparoscopic assisted right colectomy   Antibiotics:   Anti-infectives (From admission, onward)   Start     Dose/Rate Route Frequency Ordered Stop   01/30/18 1030  cefoTEtan (CEFOTAN) 2 g in sodium chloride 0.9 % 100 mL IVPB     2 g 200 mL/hr over 30 Minutes Intravenous To Surgery 01/30/18 1027 01/31/18 1608       Objective   Vitals:   02/06/18 0901 02/06/18 0904 02/06/18 0905 02/06/18 1515  BP: (!) 94/42 (!) 75/33 (!) 74/44 (!) 105/42  Pulse:    (!) 59  Resp:      Temp:    97.7 F (36.5 C)  TempSrc:    Oral  SpO2: 99%   100%  Weight:      Height:        Intake/Output Summary (Last 24 hours) at 02/06/2018 1712 Last data filed at 02/06/2018 0529 Gross per 24 hour  Intake -  Output 100 ml  Net -100 ml   Filed Weights   02/03/18 1630 02/05/18 0523 02/06/18 0529  Weight: 100.6 kg 100.1 kg 100.8 kg     Physical Examination:   Eyes: No icterus, extraocular muscles intact  Mouth: Oral mucosa is moist, no lesions on palate,  Neck: Supple, no deformities, masses, or tenderness Lungs: Normal respiratory effort, bilateral clear to auscultation, no crackles or wheezes.  Heart: Regular rate and rhythm, S1 and S2 normal, no murmurs, rubs auscultated Abdomen: BS normoactive,soft,nondistended,non-tender to palpation,no organomegaly, JP drain in place Extremities: No pretibial edema, no erythema, no cyanosis, no clubbing Neuro : Alert and oriented to time, place and person, No focal deficits Skin: No rashes seen on exam      Data Reviewed: I have personally reviewed following labs and imaging studies   Recent Results (from the past 240 hour(s))  Surgical pcr screen     Status: None   Collection Time: 01/30/18  5:19 AM  Result Value  Ref Range Status   MRSA, PCR NEGATIVE NEGATIVE Final   Staphylococcus aureus NEGATIVE NEGATIVE Final    Comment: (NOTE) The Xpert SA Assay (FDA approved for NASAL specimens in patients 40 years of age and older), is one component of a comprehensive surveillance program. It is not intended to diagnose infection nor to guide or monitor treatment. Performed at [facility], [place] [address], [place], [place] [address]      Liver Function Tests: Recent Labs  Lab 01/31/18 0350 02/01/18 0540  AST 10* 10*  ALT 12 11  ALKPHOS 54 51  BILITOT  1.1 1.0  PROT 5.6* 5.8*  ALBUMIN 2.8* 2.9*   No results for input(s): LIPASE, AMYLASE in the last 168 hours. No results for input(s): AMMONIA in the last 168 hours.  Cardiac Enzymes: No results for input(s): CKTOTAL, CKMB, CKMBINDEX, TROPONINI in the last 168 hours. BNP (last 3 results) Recent Labs    01/26/18 1051  BNP 1,290.3*    ProBNP (last 3 results) No results for input(s): PROBNP in the last 8760 hours.    Studies: No results found.  Scheduled Meds: . sodium chloride   Intravenous Once  . acetaminophen  1,000 mg Oral Q8H  . carvedilol  12.5 mg Oral BID WC  . cholecalciferol  2,000 Units Oral Daily  . donepezil  10 mg Oral QHS  . ezetimibe  10 mg Oral QHS  . feeding supplement (GLUCERNA SHAKE)  237 mL Oral TID BM  . insulin aspart  0-5 Units Subcutaneous QHS  . insulin aspart  0-9 Units Subcutaneous TID WC  . insulin glargine  5 Units Subcutaneous QHS  . methocarbamol  500 mg Oral Q8H  . multivitamin with minerals  1 tablet Oral Daily  . pantoprazole  40 mg Oral BID  . polyethylene glycol  17 g Oral Daily  . potassium chloride  40 mEq Oral BID  . rosuvastatin  10 mg Oral Daily  . tamsulosin  0.4 mg Oral QHS      Time spent: 25 min  [facility] Pager [phone]. If 7PM-7AM, please contact night-coverage at [email or web address], Office  [phone]  password [email or web address]  02/06/2018, 5:12 PM   LOS: 12 days

## 2018-02-06 NOTE — Progress Notes (Signed)
7 Days Post-Op    CC:  Adenocarcinoma   Subjective: He looks and feels better this AM.  Tolerating diet, 1 stool yesterday.  His wife says that there was some blood per much less than the prior bowel movements.  He walked 4 times yesterday, he also sat up in the chair quite a bit.  Objective: Vital signs in last 24 hours: Temp:  [97.5 F (36.4 C)-97.7 F (36.5 C)] 97.7 F (36.5 C) (09/10 0529) Pulse Rate:  [59-81] 60 (09/10 0529) Resp:  [16-18] 18 (09/10 0529) BP: (100-108)/(51-59) 106/52 (09/10 0529) SpO2:  [97 %-100 %] 98 % (09/10 0529) Weight:  [100.8 kg] 100.8 kg (09/10 0529) Last BM Date: 02/04/18 840 PO 325 urine recored x 1 shift Drain 170 Stool x 1 Afebrile, VSS Creatinine is up, K+ is down some,  H/H is stable INR  1.45 Paced on telemetry Intake/Output from previous day: 09/09 0701 - 09/10 0700 In: 840 [P.O.:840] Out: 495 [Urine:325; Drains:170] Intake/Output this shift: No intake/output data recorded.  General appearance: alert, cooperative, no distress and Wearing CPAP to sleep. Resp: clear to auscultation bilaterally GI: Soft, positive bowel sounds, incision looks good.  Drain is serosanguineous.  Lab Results:  Recent Labs    02/05/18 0703 02/06/18 0617  WBC 4.2 8.7  HGB 7.4* 7.6*  HCT 24.0* 24.6*  PLT 136* 147*    BMET Recent Labs    02/05/18 0703 02/06/18 0617  NA 142 139  K 3.7 3.2*  CL 106 104  CO2 28 26  GLUCOSE 142* 127*  BUN 11 13  CREATININE 1.36* 1.48*  CALCIUM 8.8* 8.8*   PT/INR Recent Labs    02/05/18 0703 02/06/18 0617  LABPROT 17.1* 17.5*  INR 1.41 1.45    Recent Labs  Lab 01/31/18 0350 02/01/18 0540  AST 10* 10*  ALT 12 11  ALKPHOS 54 51  BILITOT 1.1 1.0  PROT 5.6* 5.8*  ALBUMIN 2.8* 2.9*     Lipase     Component Value Date/Time   LIPASE 47 01/25/2018 0508     Medications: . sodium chloride   Intravenous Once  . acetaminophen  1,000 mg Oral Q8H  . carvedilol  12.5 mg Oral BID WC  .  cholecalciferol  2,000 Units Oral Daily  . donepezil  10 mg Oral QHS  . ezetimibe  10 mg Oral QHS  . feeding supplement (GLUCERNA SHAKE)  237 mL Oral TID BM  . furosemide  40 mg Oral Daily  . insulin aspart  0-5 Units Subcutaneous QHS  . insulin aspart  0-9 Units Subcutaneous TID WC  . insulin glargine  5 Units Subcutaneous QHS  . methocarbamol  500 mg Oral Q8H  . multivitamin with minerals  1 tablet Oral Daily  . pantoprazole  40 mg Oral BID  . polyethylene glycol  17 g Oral Daily  . rosuvastatin  10 mg Oral Daily  . tamsulosin  0.4 mg Oral QHS   . dextrose 5 % and 0.9 % NaCl with KCl 20 mEq/L 10 mL/hr at 02/02/18 1318  . lactated ringers Stopped (01/30/18 1336)  Pathology:  Colon, segmental resection for tumor, Terminal Ileum w/ right and Proximal Transverse - ADENOCARCINOMA, MODERATE TO POORLY DIFFERENTIATED, (7.6 CM) - NO CARCINOMA IDENTIFIED IN TWENTY-SEVEN LYMPH NODES (0/27) - MARGINS UNINVOLVED BY INVASIVE CARCINOMA - TUBULAR ADENOMA (X2) -T3 N0  Assessment/Plan  Type 2 diabetes mellitus- SSI Atrial fibrillation- per primary team, on SQ heparin Chronic systolicCHF- last EF around 20%, management of fluid status  per cardiology Hx of CABG Acute on ChronicAnemia - H/H 8.8/28.1, >> 7.4/23.6 Tobacco use x 40 years/none x 33 years Hx of mild dementia OSA/CPAP   GI bleed withTransverse colonadenocarcinoma - s/p laparoscopic assisted extended right hemicolectomy 01/30/18 Dr. Toth//Cystoscopy with foley placement Dr. Alexis Frock - POD#7 - drainoutput decreasing, serous, still>300 -regular. - continue to mobilize -Hemoglobin/ hematocrit is stable. Less blood in stool yesterday.  GHW:EXHBZJI diet VTE: heparin gtt and then transition to coumadinper primary- heparin on hold for bleeding ID: [email or web address] periop Follow up:Dr. Marlou Starks   Plan: Patient is hemodynamically stable.  He is feeling better.  His H&H is stable.  Creatinine is up some and potassium needs to  be increased.  Will defer to medicine on management of his medications and anticoagulation.  Making good progress from a surgical standpoint.  LOS: 12 days    Wisdom Rickey 02/06/2018 [phone]

## 2018-02-06 NOTE — Progress Notes (Signed)
Pt places self on dream station when ready for bed. RT offered to help with placement but pt states he has been doing it himself. RT will continue to monitor.

## 2018-02-06 NOTE — Evaluation (Signed)
Occupational Therapy Evaluation Patient Details Name: Luis Palmer MRN: [id number] DOB: 1932/08/18 Today's Date: 02/06/2018    History of Present Illness Pt is an 84 y/o male admitted to [facility] on 01/21/18 w/ BRBPR of acute onset and transferred to [facility] for GI bleed. 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 Marlou Starks. PMH including but not limited to DM, severe ICM sp ICD, afib ablation x 2, CAD sp CABG, chron systolic CHF and chronic a-fib.   Clinical Impression   Patient evaluated by Occupational Therapy with no further acute OT needs identified. All education has been completed and the patient has no further questions. See below for any follow-up Occupational Therapy or equipment needs. OT to sign off. Thank you for referral.      Follow Up Recommendations  No OT follow up    Equipment Recommendations  None recommended by OT    Recommendations for Other Services       Precautions / Restrictions Precautions Precautions: Fall Precaution Comments: L abdominal JP drain Restrictions Weight Bearing Restrictions: No      Mobility Bed Mobility               General bed mobility comments: in chair on arrival  Transfers                      Balance Overall balance assessment: Needs assistance Sitting-balance support: Feet supported Sitting balance-Leahy Scale: Good     Standing balance support: During functional activity;Single extremity supported;Bilateral upper extremity supported Standing balance-Leahy Scale: Good                             ADL either performed or assessed with clinical judgement   ADL Overall ADL's : Modified independent                                       General ADL Comments: able to figure 4 cross, demonstrates ambulation to and from bathroom wife present during session. pt educated on use of clean linen and washing  around incision site. wife and patient with no questions or concerns at this time.      Vision Baseline Vision/History: Wears glasses       Perception     Praxis      Pertinent Vitals/Pain Pain Assessment: Faces Faces Pain Scale: Hurts little more Pain Location: abdomen Pain Descriptors / Indicators: Sore     Hand Dominance Right   Extremity/Trunk Assessment Upper Extremity Assessment Upper Extremity Assessment: Overall WFL for tasks assessed       Cervical / Trunk Assessment Cervical / Trunk Assessment: Kyphotic   Communication Communication Communication: No difficulties   Cognition Arousal/Alertness: Awake/alert Behavior During Therapy: WFL for tasks assessed/performed Overall Cognitive Status: Within Functional Limits for tasks assessed                                     General Comments       Exercises General Exercises - Lower Extremity Hip ABduction/ADduction: (hip adduction squeezes with 30 sec hold using pillow)   Shoulder Instructions      Home Living Family/patient expects to be discharged to:: Private residence Living Arrangements: Spouse/significant other Available Help at Discharge: Family Type  of Home: House Home Access: Level entry     Home Layout: One level     Bathroom Shower/Tub: Occupational psychologist: Standard     Home Equipment: Shower seat   Additional Comments: shower is roll in and has bench with grab bars. (no threshold)      Prior Functioning/Environment Level of Independence: Independent        Comments: pt reported that he usually walks a mile a day        OT Problem List:        OT Treatment/Interventions:      OT Goals(Current goals can be found in the care plan section) Acute Rehab OT Goals Patient Stated Goal: to stay here until they know why this is still draining Potential to Achieve Goals: Good  OT Frequency:     Barriers to D/C:            Co-evaluation               AM-PAC PT "6 Clicks" Daily Activity     Outcome Measure Help from another person eating meals?: None Help from another person taking care of personal grooming?: None Help from another person toileting, which includes using toliet, bedpan, or urinal?: None Help from another person bathing (including washing, rinsing, drying)?: None Help from another person to put on and taking off regular upper body clothing?: None Help from another person to put on and taking off regular lower body clothing?: None 6 Click Score: 24   End of Session Nurse Communication: Mobility status;Precautions  Activity Tolerance: Patient tolerated treatment well Patient left: in chair;with call bell/phone within reach;with family/visitor present                   Time: 9678-9381 OT Time Calculation (min): 8 min Charges:  OT General Charges $OT Visit: 1 Visit OT Evaluation $OT Eval Low Complexity: 1 Low   Jeri Modena, OTR/L  Acute Rehabilitation Services Pager: [phone] Office: [phone] .   Parke Poisson B 02/06/2018, 2:05 PM

## 2018-02-07 LAB — CBC
HCT: 23.6 % — ABNORMAL LOW (ref 39.0–52.0)
HEMOGLOBIN: 7.2 g/dL — AB (ref 13.0–17.0)
MCH: 27.4 pg (ref 26.0–34.0)
MCHC: 30.5 g/dL (ref 30.0–36.0)
MCV: 89.7 fL (ref 78.0–100.0)
Platelets: 133 10*3/uL — ABNORMAL LOW (ref 150–400)
RBC: 2.63 MIL/uL — AB (ref 4.22–5.81)
RDW: 17.7 % — ABNORMAL HIGH (ref 11.5–15.5)
WBC: 7.1 10*3/uL (ref 4.0–10.5)

## 2018-02-07 LAB — GLUCOSE, CAPILLARY
Glucose-Capillary: 103 mg/dL — ABNORMAL HIGH (ref 70–99)
Glucose-Capillary: 155 mg/dL — ABNORMAL HIGH (ref 70–99)
Glucose-Capillary: 131 mg/dL — ABNORMAL HIGH (ref 70–99)
Glucose-Capillary: 146 mg/dL — ABNORMAL HIGH (ref 70–99)

## 2018-02-07 LAB — RETICULOCYTES
RBC.: 2.58 MIL/uL — AB (ref 4.22–5.81)
RETIC COUNT ABSOLUTE: 105.8 10*3/uL (ref 19.0–186.0)
RETIC CT PCT: 4.1 % — AB (ref 0.4–3.1)

## 2018-02-07 LAB — IRON AND TIBC
Iron: 24 ug/dL — ABNORMAL LOW (ref 45–182)
SATURATION RATIOS: 10 % — AB (ref 17.9–39.5)
TIBC: 235 ug/dL — ABNORMAL LOW (ref 250–450)
UIBC: 211 ug/dL

## 2018-02-07 LAB — BASIC METABOLIC PANEL
ANION GAP: 13 (ref 5–15)
BUN: 14 mg/dL (ref 8–23)
CALCIUM: 9.2 mg/dL (ref 8.9–10.3)
CO2: 24 mmol/L (ref 22–32)
Chloride: 103 mmol/L (ref 98–111)
Creatinine, Ser: 1.5 mg/dL — ABNORMAL HIGH (ref 0.61–1.24)
GFR calc Af Amer: 48 mL/min — ABNORMAL LOW (ref >60)
GFR calc non Af Amer: 41 mL/min — ABNORMAL LOW (ref >60)
GLUCOSE: 119 mg/dL — AB (ref 70–99)
Potassium: 4.3 mmol/L (ref 3.5–5.1)
Sodium: 140 mmol/L (ref 135–145)

## 2018-02-07 LAB — VITAMIN B12: Vitamin B-12: 425 pg/mL (ref 180–914)

## 2018-02-07 LAB — FOLATE: Folate: 12.1 ng/mL (ref >5.9)

## 2018-02-07 LAB — PROTIME-INR
INR: 1.42
Prothrombin Time: 17.2 seconds — ABNORMAL HIGH (ref 11.4–15.2)

## 2018-02-07 LAB — FERRITIN: FERRITIN: 100 ng/mL (ref 24–336)

## 2018-02-07 MED ORDER — SODIUM CHLORIDE 0.9 % IV SOLN
500.0000 mg | Freq: Every day | INTRAVENOUS | Status: AC
  Administered 2018-02-07 – 2018-02-09 (×3): 500 mg via INTRAVENOUS
  Filled 2018-02-07: qty 4
  Filled 2018-02-07 (×3): qty 10

## 2018-02-07 MED ORDER — SODIUM CHLORIDE 0.9 % IV SOLN
25.0000 mg | Freq: Once | INTRAVENOUS | Status: AC
  Administered 2018-02-07: 25 mg via INTRAVENOUS
  Filled 2018-02-07: qty 0.5

## 2018-02-07 NOTE — Progress Notes (Addendum)
MEDICATION RELATED CONSULT NOTE - INITIAL   Pharmacy Consult for IV iron Indication: anemia  Allergies  Allergen Reactions  . Pseudoephedrine Hcl Other (See Comments)    Other Reaction: ANURIA  . Ace Inhibitors Cough and Other (See Comments)    cough cough   . Nsaids Other (See Comments)    Reaction:  Fluid retention   . Vasotec [Enalapril] Cough    Patient Measurements: Height: 6' (182.9 cm) Weight: 235 lb 0.2 oz (106.6 kg) IBW/kg (Calculated) : 77.6  Vital Signs: Temp: 98.4 F (36.9 C) (09/11 0442) Temp Source: Oral (09/11 0442) BP: 108/45 (09/11 0442) Pulse Rate: 63 (09/11 0442) Intake/Output from previous day: 09/10 0701 - 09/11 0700 In: 0  Out: 200 [Drains:200] Intake/Output from this shift: No intake/output data recorded.  Labs: Recent Labs    02/05/18 0703 02/06/18 0617 02/07/18 0548  WBC 4.2 8.7 7.1  HGB 7.4* 7.6* 7.2*  HCT 24.0* 24.6* 23.6*  PLT 136* 147* 133*  CREATININE 1.36* 1.48* 1.50*  MG  --  1.8  --    Estimated Creatinine Clearance: 47.1 mL/min (A) (by C-G formula based on SCr of 1.5 mg/dL (H)).   Microbiology: Recent Results (from the past 720 hour(s))  Surgical pcr screen     Status: None   Collection Time: 01/30/18  5:19 AM  Result Value Ref Range Status   MRSA, PCR NEGATIVE NEGATIVE Final   Staphylococcus aureus NEGATIVE NEGATIVE Final    Comment: (NOTE) The Xpert SA Assay (FDA approved for NASAL specimens in patients 23 years of age and older), is one component of a comprehensive surveillance program. It is not intended to diagnose infection nor to guide or monitor treatment. Performed at [facility], [place] [address] [place], [place] [address]     Medical History: Past Medical History:  Diagnosis Date  . AICD (automatic cardioverter/defibrillator) present 2006  . Anemia   . Atrial fibrillation ([place])   . CHF (congestive heart failure) ([place])   . CKD (chronic kidney disease), stage III ([place])   . Colon cancer ([place])     "dx'd 01/24/2018"  . High cholesterol   . History of blood transfusion 01/22/2018   "I lost blood; HgB extremely low"  . History of gout   . Myocardial infarction ([place]) 1986  . OSA on CPAP   . Pneumonia 2015  . Type II diabetes mellitus ([facility])     Medications:  Scheduled:  . sodium chloride   Intravenous Once  . acetaminophen  1,000 mg Oral Q8H  . carvedilol  12.5 mg Oral BID WC  . cholecalciferol  2,000 Units Oral Daily  . donepezil  10 mg Oral QHS  . ezetimibe  10 mg Oral QHS  . feeding supplement (GLUCERNA SHAKE)  237 mL Oral TID BM  . Influenza vac split quadrivalent PF  0.5 mL Intramuscular Tomorrow-1000  . insulin aspart  0-5 Units Subcutaneous QHS  . insulin aspart  0-9 Units Subcutaneous TID WC  . insulin glargine  5 Units Subcutaneous QHS  . methocarbamol  500 mg Oral Q8H  . multivitamin with minerals  1 tablet Oral Daily  . pantoprazole  40 mg Oral BID  . polyethylene glycol  17 g Oral Daily  . potassium chloride  40 mEq Oral BID  . rosuvastatin  10 mg Oral Daily  . tamsulosin  0.4 mg Oral QHS    Assessment: 84 y.o. M known to pharmacy from previous anticoagulation dosing. Pt with bloody stool and serous drainage from  JP drain so currently off all anticoagulation.  Pharmacy consulted to dose IV iron. Hgb 7.2 (low but relatively stable over past 96 hours). Iron studies (8/30): Iron 17 (low), Tsat 8 (low)  Goal of Therapy:  Hgb >/=12  Plan:  Repeat iron studies After these are drawn: Iron dextran 25mg  test dose followed by Iron dextran 500mg  IV daily x 3 days  Sherlon Handing, PharmD, BCPS Clinical pharmacist  **Pharmacist phone directory can now be found on [email or web address] ([email or web address]).  Listed under [place]. 02/07/2018,7:48 AM   Addendum 1016: Anemia panel resulted: Iron 24 (low), Tsat 10 (low), ferritin 100 (wnl), folate wnl, vit B12 wnl, and retic ct wnl  Sherlon Handing, PharmD, BCPS Clinical pharmacist 02/07/2018 10:18 AM

## 2018-02-07 NOTE — Progress Notes (Signed)
Brief cardiology follow up note  Patient seen today. Doing much better, drainage is more clear/less bloody than prior. No additional bleeding seen. Hgb 7.2 from 7.6 today.  Recommend starting anticoagulation tomorrow AM. I would recommend pharmacy consult to start heparin drip in AM (I would aim for no or low bolus given recent surgery). If he does not have substantial bleeding on heparin drip, would start coumadin tomorrow evening.  We will continue to follow.  Buford Dresser, MD, PhD [facility]  [address], [place], [place] [address] [phone]

## 2018-02-07 NOTE — Progress Notes (Signed)
8 Days Post-Op    CC: Adenocarcinoma of the colon  Subjective: He continues to do well from a surgical standpoint.  Vision is healing nicely.  He is tolerating a diet, and bowel movements are back to normal.  He notes very little blood in his stool yesterday.  He has problems with chronic anemia, he is been on iron infusions in [place] prior to this diagnosis.  He notes he is stronger and walking longer distances in the halls here.  Objective: Vital signs in last 24 hours: Temp:  [97.7 F (36.5 C)-98.4 F (36.9 C)] 98.4 F (36.9 C) (09/11 0442) Pulse Rate:  [59-63] 63 (09/11 0442) Resp:  [18-20] 20 (09/11 0442) BP: (74-109)/(33-48) 108/45 (09/11 0442) SpO2:  [99 %-100 %] 100 % (09/11 0442) Weight:  [106.6 kg] 106.6 kg (09/11 0500) Last BM Date: 02/04/18 Nothing PO recorded No IV recorded No urine recorded 200 from JP drain Afebrile, VSS Orthostatic - just done once despite request it be done BID  100/48 >> 74/44 yesterday 0800 hrs Creatinine continues to trend up.  - Defer to Medicine/Cardilogy INR 1.42 Telem shows paced rhythm, with PVC's, but I think his baseline is AF with LBBB  Intake/Output from previous day: 09/10 0701 - 09/11 0700 In: 0  Out: 200 [Drains:200] Intake/Output this shift: No intake/output data recorded.  General appearance: alert, cooperative and no distress Resp: clear to auscultation bilaterally GI: ` Soft, still a bit sore especially when he coughs.  Tolerating diet and BM is back to normal.  Drainage is serosanguineous.  He reports less blood in his stool yesterday.  Lab Results:  Recent Labs    02/06/18 0617 02/07/18 0548  WBC 8.7 7.1  HGB 7.6* 7.2*  HCT 24.6* 23.6*  PLT 147* 133*    BMET Recent Labs    02/06/18 0617 02/07/18 0548  NA 139 140  K 3.2* 4.3  CL 104 103  CO2 26 24  GLUCOSE 127* 119*  BUN 13 14  CREATININE 1.48* 1.50*  CALCIUM 8.8* 9.2   PT/INR Recent Labs    02/06/18 0617 02/07/18 0548   LABPROT 17.5* 17.2*  INR 1.45 1.42    Recent Labs  Lab 02/01/18 0540  AST 10*  ALT 11  ALKPHOS 51  BILITOT 1.0  PROT 5.8*  ALBUMIN 2.9*     Lipase     Component Value Date/Time   LIPASE 47 01/25/2018 0508     Medications: . sodium chloride   Intravenous Once  . acetaminophen  1,000 mg Oral Q8H  . carvedilol  12.5 mg Oral BID WC  . cholecalciferol  2,000 Units Oral Daily  . donepezil  10 mg Oral QHS  . ezetimibe  10 mg Oral QHS  . feeding supplement (GLUCERNA SHAKE)  237 mL Oral TID BM  . Influenza vac split quadrivalent PF  0.5 mL Intramuscular Tomorrow-1000  . insulin aspart  0-5 Units Subcutaneous QHS  . insulin aspart  0-9 Units Subcutaneous TID WC  . insulin glargine  5 Units Subcutaneous QHS  . methocarbamol  500 mg Oral Q8H  . multivitamin with minerals  1 tablet Oral Daily  . pantoprazole  40 mg Oral BID  . polyethylene glycol  17 g Oral Daily  . potassium chloride  40 mEq Oral BID  . rosuvastatin  10 mg Oral Daily  . tamsulosin  0.4 mg Oral QHS    Assessment/Plan  Type 2 diabetes mellitus- SSI Atrial fibrillation- per primary team, on SQ heparin  Chronic systolicCHF- last EF around 20%, management of fluid status per cardiology Hx of CABG Acute on ChronicAnemia; Hx of prior iron infusions - H/H 8.8/28.1,>> 7.4/23.6 >> 7.2/23.6 Tobacco use x 40 years/none x 33 years Hx of mild dementia OSA/CPAP Orthostatic Hypotension post op    GI bleed withTransverse colonadenocarcinoma - s/p laparoscopic assisted extended right hemicolectomy 01/30/18 Dr. Toth//Cystoscopy with foley placement Dr. Alexis Frock - POD#8 - drainoutput decreasing, serous, 200 cc -regular. - continue to mobilize -Hemoglobin/ hematocrit is stable. Less blood in stool yesterday.  GEX:[id number] VTE: heparin gtt and then transition to coumadinper primary- heparin on hold for bleeding INR 1.42 ID: [email or web address] periop Follow up:Dr. Marlou Starks   Plan:  From our standpoint  he is doing well.  When his renal function, orthostasis, and anticoagulation issues are resolved I think he could go home.      LOS: 13 days    Renesmae Donahey 02/07/2018 [phone]

## 2018-02-07 NOTE — Progress Notes (Signed)
0030 [employer] notified nurse of 3 beat run PVC's. Checked on patient, resting with Cpap on, stated no problems.

## 2018-02-07 NOTE — Progress Notes (Signed)
Physical Therapy Treatment Patient Details Name: Luis Palmer MRN: [id number] DOB: 1932/10/10 Today's Date: 02/07/2018    History of Present Illness Pt is an 84 y/o male admitted to [facility] on [facility]/21/18 w/ BRBPR of acute onset and transferred to [facility] for GI bleed. 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 Marlou Starks. PMH including but not limited to DM, severe ICM sp ICD, afib ablation x 2, CAD sp CABG, chron systolic CHF and chronic a-fib.    PT Comments    Pt performed gait training with PTA as he was standing with wife on arrival getting ready to leave room for a walk.  Pt is progressing quite well and even performed short trial without device and no LOB.  Pt reports pain as 2/10.  PTA educated patient to continue ambulation with family and staff 3x daily as well as performing incentive spirometry 10x/hr.  Plan remains appropriate for HHPT for home safety eval and progression to home HEP.     Follow Up Recommendations  Home health PT;Supervision - Intermittent     Equipment Recommendations  Rolling walker with 5" wheels    Recommendations for Other Services       Precautions / Restrictions Precautions Precautions: Fall Restrictions Weight Bearing Restrictions: No    Mobility  Bed Mobility               General bed mobility comments: Pt standing in room on arrival with wife at his side.    Transfers Overall transfer level: Needs assistance Equipment used: Rolling walker (2 wheeled) Transfers: Sit to/from Stand Sit to Stand: Supervision         General transfer comment: Supervision for safety with cues for hand placement.    Ambulation/Gait Ambulation/Gait assistance: Min guard Gait Distance (Feet): 650 Feet(Plus addition 4 ft trial without device. ) Assistive device: Rolling walker (2 wheeled);None Gait Pattern/deviations: Step-through pattern;Drifts right/left;Trunk flexed Gait  velocity: decreased   General Gait Details: Pt required cues for upper trunk control. kyphotic at baseline.     Stairs Stairs: (pt denies stairs in home or entry way.  )           Wheelchair Mobility    Modified Rankin (Stroke Patients Only)       Balance Overall balance assessment: Needs assistance Sitting-balance support: Feet supported Sitting balance-Leahy Scale: Good     Standing balance support: During functional activity;Single extremity supported;Bilateral upper extremity supported Standing balance-Leahy Scale: Good                              Cognition Arousal/Alertness: Awake/alert Behavior During Therapy: WFL for tasks assessed/performed Overall Cognitive Status: Within Functional Limits for tasks assessed                                        Exercises Other Exercises Other Exercises: Educated about incentive spirometer use. Pt able to recall appropriate frequency. Pt performed 1-10 reps during intial trial he performed incorrectly and required cues for technique.      General Comments        Pertinent Vitals/Pain Pain Assessment: 0-10 Pain Score: 2  Pain Location: abdomen Pain Descriptors / Indicators: Sore Pain Intervention(s): Monitored during session;Repositioned    Home Living  Prior Function            PT Goals (current goals can now be found in the care plan section) Acute Rehab PT Goals Patient Stated Goal: to get better and go home Potential to Achieve Goals: Good Progress towards PT goals: Progressing toward goals    Frequency    Min 3X/week      PT Plan Current plan remains appropriate    Co-evaluation              AM-PAC PT "6 Clicks" Daily Activity  Outcome Measure  Difficulty turning over in bed (including adjusting bedclothes, sheets and blankets)?: None Difficulty moving from lying on back to sitting on the side of the bed? : None Difficulty  sitting down on and standing up from a chair with arms (e.g., wheelchair, bedside commode, etc,.)?: Unable Help needed moving to and from a bed to chair (including a wheelchair)?: None Help needed walking in hospital room?: A Little Help needed climbing 3-5 steps with a railing? : A Little 6 Click Score: 19    End of Session Equipment Utilized During Treatment: Gait belt Activity Tolerance: Patient tolerated treatment well Patient left: in chair;with call bell/phone within reach;with family/visitor present Nurse Communication: Mobility status PT Visit Diagnosis: Other abnormalities of gait and mobility (R26.89)     Time: 8377-9396 PT Time Calculation (min) (ACUTE ONLY): 12 min  Charges:  $Gait Training: 8-22 mins                     Governor Rooks, PTA Acute Rehabilitation Services Pager [phone] Office [phone]     Alban Marucci Eli Hose 02/07/2018, 6:01 PM

## 2018-02-07 NOTE — Progress Notes (Signed)
PROGRESS NOTE    Luis Palmer  NID:[id number] DOB: [birth year] DOA: 01/25/2018 PCP: Idelle Crouch, MD     Brief Narrative:  84 year old male with a history of ischemic cardiomyopathy, status post ICD, atrial fibrillation status post ablation x2, CAD with history of CABG, chronic systolic heart failure, chronic atrial fibrillation on anticoagulation was initially admitted to [facility] regional hospital day on 01/21/2018 with acute onset of bright red blood per rectum.  He was given Kcentra to reverse anticoagulation and transfuse PRBC.  He underwent colonoscopy showing colon mass in the ascending colon that was biopsied showing adenocarcinoma of the colon.   Status post laparoscopic assisted extended right hemicolectomy on 01/30/2018.  Tolerating diet and reporting no significant pain.  Patient is also actively follow by cardiology service. Still anemic, but no signs of active bleeding appreciated. Anticoagulation on hold. Will receive IV iron X 3 days, follow Hgb trend.   Assessment & Plan: 1-Transverse colon adenocarcinoma: s/p post right hemicolectomy on 02/09/18 -Will continue to follow possibility recommendations by general surgery. -continue PRN analgesics -continue increasing physical activity -patient tolerating regular diet and had BM.  2-iron deficiency anemia: Acute on chronic; in the setting of acute blood loss from GI bleed and subsequently after surgery. -No further signs of active bleeding appreciated -Continue holding anticoagulation -Hemoglobin 7.2 -We will provide IV iron infusion. -Iron studies are low, and the patient reported to me that he was in need of intermittent iron transfusion as an outpatient due to anemia in the past. -follow Hgb trend   3-chronic atrial fibrillation -CHADsVASC score 5 -Continue Coreg for rate control -Anticoagulation remains on hold -Cardiology is on board and will follow further recommendations.  4-ischemic cardiomyopathy: Acute on  chronic systolic heart failure. -Ejection fraction 20% -Appears to be compensated currently -Will continue beta-blocker and daily oral Lasix -Follow daily weights and is straight intake and output -ARB remains on hold (patient with soft blood pressure) -Cardiology service on board will follow further recommendations.   5-chronic kidney disease is stage III -Overall stable and back to baseline -Continue holding ARB for now -continue to follow Cr trend  -continue current dose of oral lasix.  6-essential hypertension -Blood pressure is soft, but stable overall -Continue Coreg twice daily and Lasix -Follow vital signs.  7-Biventricular implantable cardioverter-defibrillator in situ -Interrogated by cardiology and apparently working as intended -For now continue monitoring on telemetry.  8-history of coronary artery disease status post CABG -No chest pain or shortness of breath -Continue beta-blocker and statins  9-hypokalemia -K has been repleted and is WNL currently (4.3)  10-mild dementia without behavioral disturbances -Continue daily Aricept.  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   DVT prophylaxis: SCDs Code Status: Full code Family Communication: Wife at bedside. Disposition Plan: home once medically stable; in the next 48 hours hopefully.   Consultants:   General surgery   Cardiology   Procedures:   Laparoscopic extended right hemicolectomy  Antimicrobials:  Anti-infectives (From admission, onward)   Start     Dose/Rate Route Frequency Ordered Stop   01/30/18 1030  cefoTEtan (CEFOTAN) 2 g in sodium chloride 0.9 % 100 mL IVPB     2 g 200 mL/hr over 30 Minutes Intravenous To Surgery 01/30/18 1027 01/31/18 1608       Subjective: No acute distress, no overnight events.  Denies chest pain, shortness of breath, nausea, vomiting or any other complaints.  Objective: Vitals:    02/06/18 1515  02/06/18 2236 02/07/18 0442 02/07/18 0500  BP: (!) 105/42 (!) 109/42 (!) 108/45   Pulse: (!) 59 62 63   Resp:  18 20   Temp: 97.7 F (36.5 C) 98.4 F (36.9 C) 98.4 F (36.9 C)   TempSrc: Oral Oral Oral   SpO2: 100% 99% 100%   Weight:    106.6 kg  Height:        Intake/Output Summary (Last 24 hours) at 02/07/2018 0844 Last data filed at 02/07/2018 0600 Gross per 24 hour  Intake 0 ml  Output 200 ml  Net -200 ml   Filed Weights   02/05/18 0523 02/06/18 0529 02/07/18 0500  Weight: 100.1 kg 100.8 kg 106.6 kg    Examination: General exam: Alert, awake, oriented x 3; denies chest pain, no shortness of breath, no nausea, no vomiting.  Tolerating diet and had bowel movement.  No overnight events.  Reported no further bloody stools appreciated. Respiratory system: Clear to auscultation. Respiratory effort normal. Cardiovascular system:Regular rate. No murmurs, rubs, gallops. Gastrointestinal system: Abdomen is nondistended, soft and with just very little discomfort on palpation. Normal bowel sounds heard.  JP drain in place. Central nervous system: Alert and oriented. No focal neurological deficits. Extremities: No cyanosis or clubbing; trace edema bilaterally appreciated. Skin: No rashes, no petechiae. Psychiatry: Judgement and insight appear normal. Mood & affect appropriate.    Data Reviewed: I have personally reviewed following labs and imaging studies  CBC: Recent Labs  Lab 02/03/18 0547 02/04/18 0531 02/05/18 0703 02/06/18 0617 02/07/18 0548  WBC 5.2 4.3 4.2 8.7 7.1  HGB 8.3* 7.4* 7.4* 7.6* 7.2*  HCT 27.1* 23.6* 24.0* 24.6* 23.6*  MCV 88.3 88.4 89.2 89.5 89.7  PLT 158 155 136* 147* 818*   Basic Metabolic Panel: Recent Labs  Lab 02/02/18 0446 02/04/18 0531 02/05/18 0703 02/06/18 0617 02/07/18 0548  NA 139 139 142 139 140  K 4.5 3.7 3.7 3.2* 4.3  CL 102 103 106 104 103  CO2 30 28 28 26 24   GLUCOSE 196* 129* 142* 127* 119*  BUN 13 11 11 13 14     CREATININE 1.30* 1.39* 1.36* 1.48* 1.50*  CALCIUM 9.0 8.6* 8.8* 8.8* 9.2  MG  --   --   --  1.8  --    GFR: Estimated Creatinine Clearance: 47.1 mL/min (A) (by C-G formula based on SCr of 1.5 mg/dL (H)). Liver Function Tests: Recent Labs  Lab 02/01/18 0540  AST 10*  ALT 11  ALKPHOS 51  BILITOT 1.0  PROT 5.8*  ALBUMIN 2.9*   Coagulation Profile: Recent Labs  Lab 02/05/18 0703 02/06/18 0617 02/07/18 0548  INR 1.41 1.45 1.42   CBG: Recent Labs  Lab 02/05/18 2201 02/06/18 0822 02/06/18 1224 02/06/18 1759 02/06/18 2236  GLUCAP 148* 120* 193* 201* 160*   Anemia Panel: Recent Labs    02/07/18 0811  RETICCTPCT 4.1*   Urine analysis:    Component Value Date/Time   COLORURINE YELLOW (A) 01/21/2018 1121   APPEARANCEUR CLEAR (A) 01/21/2018 1121   APPEARANCEUR Clear 08/04/2013 1256   LABSPEC 1.009 01/21/2018 1121   LABSPEC 1.012 08/04/2013 1256   PHURINE 5.0 01/21/2018 1121   GLUCOSEU NEGATIVE 01/21/2018 1121   GLUCOSEU 50 mg/dL 08/04/2013 1256   HGBUR NEGATIVE 01/21/2018 [place] 01/21/2018 1121   BILIRUBINUR Negative 08/04/2013 [place] 01/21/2018 1121   PROTEINUR NEGATIVE 01/21/2018 1121   NITRITE NEGATIVE 01/21/2018 1121   LEUKOCYTESUR NEGATIVE 01/21/2018 1121   LEUKOCYTESUR Negative  08/04/2013 1256    Recent Results (from the past 240 hour(s))  Surgical pcr screen     Status: None   Collection Time: 01/30/18  5:19 AM  Result Value Ref Range Status   MRSA, PCR NEGATIVE NEGATIVE Final   Staphylococcus aureus NEGATIVE NEGATIVE Final    Comment: (NOTE) The Xpert SA Assay (FDA approved for NASAL specimens in patients 77 years of age and older), is one component of a comprehensive surveillance program. It is not intended to diagnose infection nor to guide or monitor treatment. Performed at [facility], [place] [address] [place], [place] [address]      Radiology Studies: No results found.  Sheduled Meds: .  sodium chloride   Intravenous Once  . acetaminophen  1,000 mg Oral Q8H  . carvedilol  12.5 mg Oral BID WC  . cholecalciferol  2,000 Units Oral Daily  . donepezil  10 mg Oral QHS  . ezetimibe  10 mg Oral QHS  . feeding supplement (GLUCERNA SHAKE)  237 mL Oral TID BM  . Influenza vac split quadrivalent PF  0.5 mL Intramuscular Tomorrow-1000  . insulin aspart  0-5 Units Subcutaneous QHS  . insulin aspart  0-9 Units Subcutaneous TID WC  . insulin glargine  5 Units Subcutaneous QHS  . methocarbamol  500 mg Oral Q8H  . multivitamin with minerals  1 tablet Oral Daily  . pantoprazole  40 mg Oral BID  . polyethylene glycol  17 g Oral Daily  . potassium chloride  40 mEq Oral BID  . rosuvastatin  10 mg Oral Daily  . tamsulosin  0.4 mg Oral QHS   Continuous Infusions: . dextrose 5 % and 0.9 % NaCl with KCl 20 mEq/L 10 mL/hr at 02/02/18 1318  . iron dextran (INFED/DEXFERRUM) IVPB (TEST DOSE)    . iron dextran (INFED/DEXFERRRUM) 500 MG IVPB    . lactated ringers Stopped (01/30/18 1336)     LOS: 13 days    Time spent: 30 minutes   Barton Dubois, MD [facility] Pager [phone]  If 7PM-7AM, please contact night-coverage [email or web address] Password [facility] 02/07/2018, 8:44 AM

## 2018-02-08 LAB — CBC
HEMATOCRIT: 23.9 % — AB (ref 39.0–52.0)
Hemoglobin: 7.3 g/dL — ABNORMAL LOW (ref 13.0–17.0)
MCH: 27.8 pg (ref 26.0–34.0)
MCHC: 30.5 g/dL (ref 30.0–36.0)
MCV: 90.9 fL (ref 78.0–100.0)
PLATELETS: 131 10*3/uL — AB (ref 150–400)
RBC: 2.63 MIL/uL — ABNORMAL LOW (ref 4.22–5.81)
RDW: 18.1 % — ABNORMAL HIGH (ref 11.5–15.5)
WBC: 5.2 10*3/uL (ref 4.0–10.5)

## 2018-02-08 LAB — GLUCOSE, CAPILLARY
GLUCOSE-CAPILLARY: 161 mg/dL — AB (ref 70–99)
GLUCOSE-CAPILLARY: 151 mg/dL — AB (ref 70–99)
Glucose-Capillary: 99 mg/dL (ref 70–99)
Glucose-Capillary: 150 mg/dL — ABNORMAL HIGH (ref 70–99)
Glucose-Capillary: 150 mg/dL — ABNORMAL HIGH (ref 70–99)

## 2018-02-08 LAB — PROTIME-INR
INR: 1.35
PROTHROMBIN TIME: 16.6 s — AB (ref 11.4–15.2)

## 2018-02-08 LAB — HEPARIN LEVEL (UNFRACTIONATED): HEPARIN UNFRACTIONATED: 0.2 [IU]/mL — AB (ref 0.30–0.70)

## 2018-02-08 MED ORDER — WARFARIN - PHARMACIST DOSING INPATIENT
Freq: Every day | Status: DC
  Administered 2018-02-08 – 2018-02-11 (×3)

## 2018-02-08 MED ORDER — WARFARIN SODIUM 5 MG PO TABS
5.0000 mg | ORAL_TABLET | Freq: Once | ORAL | Status: AC
  Administered 2018-02-08: 5 mg via ORAL
  Filled 2018-02-08: qty 1

## 2018-02-08 MED ORDER — HEPARIN (PORCINE) IN NACL 100-0.45 UNIT/ML-% IJ SOLN
1000.0000 [IU]/h | INTRAMUSCULAR | Status: DC
  Administered 2018-02-08: 900 [IU]/h via INTRAVENOUS
  Administered 2018-02-09 – 2018-02-11 (×2): 1000 [IU]/h via INTRAVENOUS
  Filled 2018-02-08 (×5): qty 250

## 2018-02-08 NOTE — Progress Notes (Signed)
PROGRESS NOTE    Luis Palmer  WEX:[id number] DOB: September 01, 1932 DOA: 01/25/2018 PCP: Idelle Crouch, MD     Brief Narrative:  84 year old male with a history of ischemic cardiomyopathy, status post ICD, atrial fibrillation status post ablation x2, CAD with history of CABG, chronic systolic heart failure, chronic atrial fibrillation on anticoagulation was initially admitted to [facility] regional hospital day on 01/21/2018 with acute onset of bright red blood per rectum.  He was given Kcentra to reverse anticoagulation and transfuse PRBC.  He underwent colonoscopy showing colon mass in the ascending colon that was biopsied showing adenocarcinoma of the colon.   Status post laparoscopic assisted extended right hemicolectomy on 01/30/2018.  Tolerating diet and reporting no significant pain.  Patient is also actively follow by cardiology service. Still anemic, but no signs of active bleeding appreciated. Anticoagulation on hold. Will receive IV iron X 3 days, follow Hgb trend.   Assessment & Plan: 1-Transverse colon adenocarcinoma: s/p post right hemicolectomy on 02/09/18 -Will continue to follow post-operative recommendations by general surgery. -continue PRN analgesics -continue increasing physical activity -patient tolerating regular diet and had BM. -appreciate nutritional services rec's for feeding supplements.  2-iron deficiency anemia: Acute on chronic; in the setting of acute blood loss from GI bleed and subsequently after surgery. -No further signs of active bleeding appreciated -Continue holding anticoagulation -Hemoglobin 7.3 -continue IV iron infusion. -Iron studies are low, and the patient reported to me that he was in need of intermittent iron transfusion as an outpatient due to iron deficiency anemia in the past. -follow Hgb trend   3-chronic atrial fibrillation -CHADsVASC score 5 -Continue Coreg for rate control -Anticoagulation to be resume today; will follow Cardiology   recommendations.  4-ischemic cardiomyopathy: Acute on chronic systolic heart failure. -Ejection fraction 20% -Appears to be compensated currently -Will continue beta-blocker and daily oral Lasix -Follow daily weights and strict intake and output -ARB remains on hold (patient with soft blood pressure) -Cardiology service on board will follow further recommendations.  5-chronic kidney disease is stage III -Overall stable and back to baseline -Continue holding ARB for now -continue to follow Cr trend  -continue current dose of oral lasix. -maintain adequate hydration.  6-essential hypertension -Blood pressure remains soft, but stable overall -Continue Coreg twice daily and Lasix -Follow vital signs.  7-Biventricular implantable cardioverter-defibrillator in situ -Interrogated by cardiology and apparently working as intended -For now continue monitoring on telemetry.  8-history of coronary artery disease status post CABG -No chest pain or shortness of breath -Continue beta-blocker and statins  9-hypokalemia -K has been repleted and is stable. -follow electrolytes trend.  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 -continue constant reorientation.  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 -follow CBG's and adjust hypoglycemic regimen as needed.   DVT prophylaxis: SCDs Code Status: Full code Family Communication: Wife at bedside. Disposition Plan: home once medically stable; hopefully in the next 48-72 hours hopefully.   Consultants:   General surgery   Cardiology   Procedures:   Laparoscopic extended right hemicolectomy  Antimicrobials:  Anti-infectives (From admission, onward)   Start     Dose/Rate Route Frequency Ordered Stop   01/30/18 1030  cefoTEtan (CEFOTAN) 2 g in sodium chloride 0.9 % 100 mL IVPB     2 g 200 mL/hr over 30 Minutes Intravenous To  Surgery 01/30/18 1027 01/31/18 1608       Subjective: In no acute distress. Denies CP, SOB, palpitations,  nausea, vomiting and any acute signs of overt bleeding.   Objective: Vitals:   02/08/18 0500 02/08/18 0533 02/08/18 0833 02/08/18 1505  BP:  (!) 109/43 (!) 143/113 (!) 111/47  Pulse:  63 69 (!) 59  Resp:  18  18  Temp:  98.6 F (37 C)  97.9 F (36.6 C)  TempSrc:  Oral  Oral  SpO2:  95%  100%  Weight: 100.4 kg     Height:        Intake/Output Summary (Last 24 hours) at 02/08/2018 1707 Last data filed at 02/08/2018 1504 Gross per 24 hour  Intake 1155.4 ml  Output 215 ml  Net 940.4 ml   Filed Weights   02/06/18 0529 02/07/18 0500 02/08/18 0500  Weight: 100.8 kg 106.6 kg 100.4 kg    Examination: General exam: Alert, awake, oriented x 3; in no acute distress. Denies CP and SOB. No fever. Denies overt bleeding. Respiratory system: Clear to auscultation. Respiratory effort normal. Cardiovascular system:RRR. No murmurs, rubs, gallops. Gastrointestinal system: Abdomen is nondistended, soft and nontender. No organomegaly or masses felt. Normal bowel sounds heard. JP drain in place, no bloody discharge seen. Central nervous system: Alert and oriented. No focal neurological deficits. Extremities: No Cyanosis, no clubbing, trace edema appreciated. SCD's in place. Skin: No rashes, no petechiae; abdominal incisional wound is clean and intact. No signs of superimposed infection. Psychiatry: Judgement and insight appear normal. Mood & affect appropriate.    Data Reviewed: I have personally reviewed following labs and imaging studies  CBC: Recent Labs  Lab 02/04/18 0531 02/05/18 0703 02/06/18 0617 02/07/18 0548 02/08/18 0455  WBC 4.3 4.2 8.7 7.1 5.2  HGB 7.4* 7.4* 7.6* 7.2* 7.3*  HCT 23.6* 24.0* 24.6* 23.6* 23.9*  MCV 88.4 89.2 89.5 89.7 90.9  PLT 155 136* 147* 133* 426*   Basic Metabolic Panel: Recent Labs  Lab 02/02/18 0446 02/04/18 0531 02/05/18 0703  02/06/18 0617 02/07/18 0548  NA 139 139 142 139 140  K 4.5 3.7 3.7 3.2* 4.3  CL 102 103 106 104 103  CO2 30 28 28 26 24   GLUCOSE 196* 129* 142* 127* 119*  BUN 13 11 11 13 14   CREATININE 1.30* 1.39* 1.36* 1.48* 1.50*  CALCIUM 9.0 8.6* 8.8* 8.8* 9.2  MG  --   --   --  1.8  --    GFR: Estimated Creatinine Clearance: 45.8 mL/min (A) (by C-G formula based on SCr of 1.5 mg/dL (H)).  Coagulation Profile: Recent Labs  Lab 02/05/18 0703 02/06/18 0617 02/07/18 0548 02/08/18 0455  INR 1.41 1.45 1.42 1.35   CBG: Recent Labs  Lab 02/07/18 1731 02/07/18 2141 02/08/18 0848 02/08/18 1237 02/08/18 1651  GLUCAP 131* 146* 99 161* 150*   Anemia Panel: Recent Labs    02/07/18 0811  VITAMINB12 425  FOLATE 12.1  FERRITIN 100  TIBC 235*  IRON 24*  RETICCTPCT 4.1*   Urine analysis:    Component Value Date/Time   COLORURINE YELLOW (A) 01/21/2018 1121   APPEARANCEUR CLEAR (A) 01/21/2018 1121   APPEARANCEUR Clear 08/04/2013 1256   LABSPEC 1.009 01/21/2018 1121   LABSPEC 1.012 08/04/2013 1256   PHURINE 5.0 01/21/2018 1121   GLUCOSEU NEGATIVE 01/21/2018 1121   GLUCOSEU 50 mg/dL 08/04/2013 1256   HGBUR NEGATIVE 01/21/2018 [place] 01/21/2018 1121   BILIRUBINUR Negative 08/04/2013 [place] 01/21/2018 1121   PROTEINUR NEGATIVE 01/21/2018 1121   NITRITE NEGATIVE 01/21/2018 1121   LEUKOCYTESUR NEGATIVE 01/21/2018 1121  LEUKOCYTESUR Negative 08/04/2013 1256    Recent Results (from the past 240 hour(s))  Surgical pcr screen     Status: None   Collection Time: 01/30/18  5:19 AM  Result Value Ref Range Status   MRSA, PCR NEGATIVE NEGATIVE Final   Staphylococcus aureus NEGATIVE NEGATIVE Final    Comment: (NOTE) The Xpert SA Assay (FDA approved for NASAL specimens in patients 32 years of age and older), is one component of a comprehensive surveillance program. It is not intended to diagnose infection nor to guide or monitor  treatment. Performed at [facility], [place] [address] [place], [place] [address]      Radiology Studies: No results found.  Sheduled Meds: . sodium chloride   Intravenous Once  . acetaminophen  1,000 mg Oral Q8H  . carvedilol  12.5 mg Oral BID WC  . cholecalciferol  2,000 Units Oral Daily  . donepezil  10 mg Oral QHS  . ezetimibe  10 mg Oral QHS  . feeding supplement (GLUCERNA SHAKE)  237 mL Oral TID BM  . insulin aspart  0-5 Units Subcutaneous QHS  . insulin aspart  0-9 Units Subcutaneous TID WC  . insulin glargine  5 Units Subcutaneous QHS  . methocarbamol  500 mg Oral Q8H  . multivitamin with minerals  1 tablet Oral Daily  . pantoprazole  40 mg Oral BID  . polyethylene glycol  17 g Oral Daily  . rosuvastatin  10 mg Oral Daily  . tamsulosin  0.4 mg Oral QHS  . warfarin  5 mg Oral Once  . Warfarin - Pharmacist Dosing Inpatient   Does not apply q1800   Continuous Infusions: . dextrose 5 % and 0.9 % NaCl with KCl 20 mEq/L 10 mL/hr at 02/02/18 1318  . heparin 900 Units/hr (02/08/18 1044)  . iron dextran (INFED/DEXFERRRUM) 500 MG IVPB 500 mg (02/08/18 1212)  . lactated ringers Stopped (01/30/18 1336)     LOS: 14 days    Time spent: 30 minutes   Barton Dubois, MD [facility] Pager [phone]  If 7PM-7AM, please contact night-coverage [email or web address] Password [place] 02/08/2018, 5:07 PM

## 2018-02-08 NOTE — Progress Notes (Signed)
9 Days Post-Op    CC:  Adenocarcinoma of the colon   Subjective: Continues to show improvement.  He slept better last night and he has prior nights.  He notes his blood pressures were better last evening when they did orthostatics.  Is tolerating the diet well. No blood reported in his stool yesterday.    Objective: Vital signs in last 24 hours: Temp:  [98 F (36.7 C)-98.6 F (37 C)] 98.6 F (37 C) (09/12 0533) Pulse Rate:  [63-90] 63 (09/12 0533) Resp:  [17-18] 18 (09/12 0533) BP: (109-142)/(43-70) 109/43 (09/12 0533) SpO2:  [95 %-100 %] 95 % (09/12 0533) Weight:  [100.4 kg] 100.4 kg (09/12 0500) Last BM Date: 02/07/18 780 PO 255 IV Voided x 5 Drain 180 Stool x 1 Afebrile, VSS, Orthostatics last PM were markedly improved. Rate was up some Sats are good, using CPAP in PM H/H is stable,  Glucose is OK - he was not on anything at home for AODM - sliding scale currently Tylenol/Robaxin are the only things he is taking for pain. Intake/Output from previous day: 09/11 0701 - 09/12 0700 In: 1035.4 [P.O.:780; IV Piggyback:255.4] Out: 180 [Drains:180] Intake/Output this shift: No intake/output data recorded.  General appearance: alert, cooperative and no distress Resp: clear to auscultation bilaterally GI: soft, minimal soreness, site looks fine, Drain is mostly serous.   Lab Results:  Recent Labs    02/07/18 0548 02/08/18 0455  WBC 7.1 5.2  HGB 7.2* 7.3*  HCT 23.6* 23.9*  PLT 133* 131*    BMET Recent Labs    02/06/18 0617 02/07/18 0548  NA 139 140  K 3.2* 4.3  CL 104 103  CO2 26 24  GLUCOSE 127* 119*  BUN 13 14  CREATININE 1.48* 1.50*  CALCIUM 8.8* 9.2   PT/INR Recent Labs    02/07/18 0548 02/08/18 0455  LABPROT 17.2* 16.6*  INR 1.42 1.35    No results for input(s): AST, ALT, ALKPHOS, BILITOT, PROT, ALBUMIN in the last 168 hours.   Lipase     Component Value Date/Time   LIPASE 47 01/25/2018 0508     Medications: . sodium chloride    Intravenous Once  . acetaminophen  1,000 mg Oral Q8H  . carvedilol  12.5 mg Oral BID WC  . cholecalciferol  2,000 Units Oral Daily  . donepezil  10 mg Oral QHS  . ezetimibe  10 mg Oral QHS  . feeding supplement (GLUCERNA SHAKE)  237 mL Oral TID BM  . Influenza vac split quadrivalent PF  0.5 mL Intramuscular Tomorrow-1000  . insulin aspart  0-5 Units Subcutaneous QHS  . insulin aspart  0-9 Units Subcutaneous TID WC  . insulin glargine  5 Units Subcutaneous QHS  . methocarbamol  500 mg Oral Q8H  . multivitamin with minerals  1 tablet Oral Daily  . pantoprazole  40 mg Oral BID  . polyethylene glycol  17 g Oral Daily  . rosuvastatin  10 mg Oral Daily  . tamsulosin  0.4 mg Oral QHS    Assessment/Plan Type 2 diabetes mellitus- SSI Atrial fibrillation- per primary team, on SQ heparin Chronic systolicCHF- last EF around 20%, management of fluid status per cardiology Hx of CABG Acute on ChronicAnemia; Hx of prior iron infusions - H/H 8.8/28.1,>> 7.4/23.6 >> 7.2/23.6 Tobacco use x 40 years/none x 33 years Hx of mild dementia OSA/CPAP Orthostatic Hypotension post op    GI bleed withTransverse colonadenocarcinoma - s/p laparoscopic assisted extended right hemicolectomy 01/30/18 Dr. Toth//Cystoscopy with foley placement  Dr. Alexis Frock - POD#9 - drainoutput decreasing, serous, 200 cc -regular. - continue to mobilize -Hemoglobin/hematocrit isstable. Less blood in stool yesterday.  [id number] VTE: heparin gtt and then transition to coumadinper primary- heparin on hold for bleeding INR 1.42 ID: [email or web address] periop Follow up:Dr. Marlou Starks   Plan:  Doing well from our standpoint, Medicine planning to restart heparin today.  BP is also better.I will start getting follow up info in the AVS.  We will most likely get the staples and drain out before he goes home.  Tomorrow is day POD#10.         LOS: 14 days    Niharika Savino 02/08/2018 [phone]

## 2018-02-08 NOTE — Progress Notes (Signed)
ANTICOAGULATION CONSULT NOTE  Pharmacy Consult:  Heparin / Coumadin Indication: atrial fibrillation  Allergies  Allergen Reactions  . Pseudoephedrine Hcl Other (See Comments)    Other Reaction: ANURIA  . Ace Inhibitors Cough and Other (See Comments)    cough cough   . Nsaids Other (See Comments)    Reaction:  Fluid retention   . Vasotec [Enalapril] Cough    Patient Measurements: Height: 6' (182.9 cm) Weight: 221 lb 5.5 oz (100.4 kg) IBW/kg (Calculated) : 77.6 Heparin Dosing Weight: 98 kg  Vital Signs: Temp: 98.6 F (37 C) (09/12 0533) Temp Source: Oral (09/12 0533) BP: 143/113 (09/12 0833) Pulse Rate: 69 (09/12 0833)  Labs: Recent Labs    02/06/18 0617 02/07/18 0548 02/08/18 0455  HGB 7.6* 7.2* 7.3*  HCT 24.6* 23.6* 23.9*  PLT 147* 133* 131*  LABPROT 17.5* 17.2* 16.6*  INR 1.45 1.42 1.35  CREATININE 1.48* 1.50*  --     Estimated Creatinine Clearance: 45.8 mL/min (A) (by C-G formula based on SCr of 1.5 mg/dL (H)).   Assessment: 85 YOM on Coumadin PTA for Afib.  Med held on admit for right hemicolectomy due to colon tumor on 01/30/18. Pharmacy consulted to manage heparin drip post-op and was to resume Coumadin on 9/8; however, pt with bloody stool and serosangous drainage from JP drain 9/8 so heparin and coumadin held. No blood reported in stool from yesterday and Hgb low but stable at 7.3. Plt remain low but relatively stable at 131. INR down to 1.35.  Cardiology ok with restarting anticoagulation today. Plan to start heparin gtt with no bolus and aim for low goal 0.3-0.5 and if pt tolerates the heparin gtt today with no further bleeding then will also start coumadin tonight.  Noted pt with therapeutic heparin levels this admission on rate of 900 units/hr.  Home coumadin dose: 5mg  daily except for 2.5mg  on Tues/Thu/Sat    Goal of Therapy:  Heparin level 0.3-0.5 units/ml with recent GI bleed - no boluses INR 2 - 3 Monitor platelets by anticoagulation protocol:  Yes    Plan:  Restart heparin gtt at 900 units/hr - no bolus Will f/u heparin level in 8 hours If no bleeding noted in stools or drainage, will give coumadin 5mg  tonight - will schedule for 10pm to give [place] a bit more time to watch for any bleeding with heparin gtt Daily PT / INR, heparin level and CBC  Sherlon Handing, PharmD, BCPS Clinical pharmacist  **Pharmacist phone directory can now be found on [email or web address] ([email or web address]).  Listed under [place]. 02/08/2018, 9:08 AM

## 2018-02-08 NOTE — Progress Notes (Signed)
Physical Therapy Treatment Patient Details Name: Luis Palmer MRN: [id number] DOB: 1932-05-31 Today's Date: 02/08/2018    History of Present Illness Pt is an 84 y/o male admitted to [facility] on 01/21/18 w/ BRBPR of acute onset and transferred to [facility] for GI bleed. 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 Marlou Starks. PMH including but not limited to DM, severe ICM sp ICD, afib ablation x 2, CAD sp CABG, chron systolic CHF and chronic a-fib.    PT Comments    Pt reported that he ambulated in hallway twice today and deferring OOB activity at this time. He was agreeable to general LE strengthening exercises (see below). Pt would continue to benefit from skilled physical therapy services at this time while admitted and after d/c to address the below listed limitations in order to improve overall safety and independence with functional mobility.    Follow Up Recommendations  Home health PT;Supervision - Intermittent     Equipment Recommendations  Rolling walker with 5" wheels    Recommendations for Other Services       Precautions / Restrictions Precautions Precautions: Fall Precaution Comments: L abdominal JP drain Restrictions Weight Bearing Restrictions: No    Mobility  Bed Mobility               General bed mobility comments: pt able to readjust and reposition in bed independently   Transfers                 General transfer comment: pt deferring OOB mobility but agreeable to general LE therex  Ambulation/Gait                 Stairs             Wheelchair Mobility    Modified Rankin (Stroke Patients Only)       Balance                                            Cognition Arousal/Alertness: Awake/alert Behavior During Therapy: WFL for tasks assessed/performed Overall Cognitive Status: Within Functional Limits for tasks assessed                                        Exercises General Exercises - Lower Extremity Ankle Circles/Pumps: AROM;Both;20 reps;Supine Quad Sets: AROM;Strengthening;Both;10 reps;Supine Gluteal Sets: AROM;Strengthening;Both;10 reps;Supine Short Arc Quad: AROM;Strengthening;Both;10 reps;Supine Heel Slides: AROM;Strengthening;Both;10 reps;Supine Hip ABduction/ADduction: AROM;Strengthening;Both;10 reps;Supine    General Comments        Pertinent Vitals/Pain Pain Assessment: No/denies pain    Home Living                      Prior Function            PT Goals (current goals can now be found in the care plan section) Acute Rehab PT Goals PT Goal Formulation: With patient/family Time For Goal Achievement: 02/16/18 Potential to Achieve Goals: Good Progress towards PT goals: Progressing toward goals    Frequency    Min 3X/week      PT Plan Current plan remains appropriate    Co-evaluation              AM-PAC PT "6 Clicks" Daily Activity  Outcome Measure  Difficulty turning over in bed (including adjusting bedclothes, sheets and blankets)?: None Difficulty moving from lying on back to sitting on the side of the bed? : None Difficulty sitting down on and standing up from a chair with arms (e.g., wheelchair, bedside commode, etc,.)?: Unable Help needed moving to and from a bed to chair (including a wheelchair)?: None Help needed walking in hospital room?: A Little Help needed climbing 3-5 steps with a railing? : A Little 6 Click Score: 19    End of Session   Activity Tolerance: Patient tolerated treatment well Patient left: in bed;with call bell/phone within reach;with family/visitor present Nurse Communication: Mobility status PT Visit Diagnosis: Other abnormalities of gait and mobility (R26.89)     Time: 1440-1450 PT Time Calculation (min) (ACUTE ONLY): 10 min  Charges:  $Therapeutic Exercise: 8-22 mins                     Sherie Don,  PT, DPT  Acute Rehabilitation Services Pager [phone] Office [place] 02/08/2018, 4:49 PM

## 2018-02-08 NOTE — Progress Notes (Signed)
Nutrition Follow-up  DOCUMENTATION CODES:   Not applicable  INTERVENTION:   -Continue Glucerna Shake po TID, each supplement provides 220 kcal and 10 grams of protein -Continue MVI with minerals daily  NUTRITION DIAGNOSIS:   Increased nutrient needs related to post-op healing as evidenced by estimated needs.  Ongoing  GOAL:   Patient will meet greater than or equal to 90% of their needs  Progressing  MONITOR:   PO intake, Supplement acceptance, Diet advancement, Labs, Weight trends, Skin, I & O's  REASON FOR ASSESSMENT:   Consult Assessment of nutrition requirement/status  ASSESSMENT:   Luis Palmer is a 84 y.o. male w/ hx of severe ICM sp ICD, afib ablation x 2, CAD sp CABG, chron systolic CHF, chron afib admitted to [facility] on 01/21/18 w/ BRBPR of acute onset.  9/3- s/p lap assisted extended rt colectomy and cystoscopy flexible with insertion of catheter 9/6- advanced to full liquid diet 9/7- advanced to soft diet 9/9- advanced to regular diet  Reviewed I/O's: +855 ml x 24 hours and -6.5 L since admission  Pt receiving nursing care at time of visit.   Pt's appetite has increased; noted meal completion 50-100%. Pt has also been consuming Glucerna supplements.   Labs reviewed: CBGS: 99-161 (inpatient orders for glycemic control are 0-5 units insulin aspart q HS, 0-9 units insulin aspart TID with meals, and 5 units insulin aspart daily).   Diet Order:   Diet Order            Diet regular Room service appropriate? Yes; Fluid consistency: Thin  Diet effective now              EDUCATION NEEDS:   No education needs have been identified at this time  Skin:  Skin Assessment: Skin Integrity Issues: Skin Integrity Issues:: Incisions Incisions: closed abdominal incision  Last BM:  02/04/18  Height:   Ht Readings from Last 1 Encounters:  01/30/18 6' (1.829 m)    Weight:   Wt Readings from Last 1 Encounters:  02/08/18 100.4 kg    Ideal Body  Weight:  80.9 kg  BMI:  Body mass index is 30.02 kg/m.  Estimated Nutritional Needs:   Kcal:  2200-2400  Protein:  105-120 grams  Fluid:  >2.2 L    Bain Whichard A. Jimmye Norman, RD, LDN, CDE Pager: [phone] After hours Pager: [phone]

## 2018-02-08 NOTE — Progress Notes (Signed)
Pt did not have his PTT drawn at 1800 , post start of heparin drip. I phone Lab tech, advised them that we needed this drawn as soon as we could, due to the importance of the lab lab. She advised she would draw his lab next.

## 2018-02-08 NOTE — Progress Notes (Signed)
Brief cardiology update: As mentioned in yesterday's note, will plan to restart anticoagulation today. Will start with heparin; if tolerated, plan to start coumadin this evening. Appreciate pharmacy assistance with dosing.   Appreciate primary and additional consult teams all working to monitor for bleeding with restart of anticoagulation.  Buford Dresser, MD, PhD [facility]  [address] [place], [place] [address] [phone]

## 2018-02-08 NOTE — Progress Notes (Signed)
ANTICOAGULATION CONSULT NOTE  Pharmacy Consult:  Heparin Indication: atrial fibrillation  Allergies  Allergen Reactions  . Pseudoephedrine Hcl Other (See Comments)    Other Reaction: ANURIA  . Ace Inhibitors Cough and Other (See Comments)    cough cough   . Nsaids Other (See Comments)    Reaction:  Fluid retention   . Vasotec [Enalapril] Cough    Patient Measurements: Height: 6' (182.9 cm) Weight: 221 lb 5.5 oz (100.4 kg) IBW/kg (Calculated) : 77.6 Heparin Dosing Weight: 98 kg  Vital Signs: Temp: 97.9 F (36.6 C) (09/12 1505) Temp Source: Oral (09/12 1505) BP: 121/51 (09/12 1802) Pulse Rate: 60 (09/12 1802)  Labs: Recent Labs    02/06/18 0617 02/07/18 0548 02/08/18 0455 02/08/18 1912  HGB 7.6* 7.2* 7.3*  --   HCT 24.6* 23.6* 23.9*  --   PLT 147* 133* 131*  --   LABPROT 17.5* 17.2* 16.6*  --   INR 1.45 1.42 1.35  --   HEPARINUNFRC  --   --   --  0.20*  CREATININE 1.48* 1.50*  --   --     Estimated Creatinine Clearance: 45.8 mL/min (A) (by C-G formula based on SCr of 1.5 mg/dL (H)).   Assessment: 85 YOM on Coumadin PTA for Afib.  Med held on admit for right hemicolectomy due to colon tumor on 01/30/18. Pharmacy consulted to manage heparin drip post-op and was to resume Coumadin on 9/8; however, pt with bloody stool and serosangous drainage from JP drain 9/8 so heparin and coumadin held. No blood reported in stool from yesterday and Hgb low but stable at 7.3. Plt remain low but relatively stable at 131. INR down to 1.35. Cardiology ok with restarting anticoagulation today. Plan to start heparin gtt with no bolus and aim for low goal 0.3-0.5 and if pt tolerates the heparin gtt today with no further bleeding then will also start coumadin tonight.  Noted pt with therapeutic heparin levels this admission on rate of 900 units/hr.  **Heparin level this evening subtherapeutic at 0.2, will increase slowly.    Goal of Therapy:  Heparin level 0.3-0.5 units/ml with recent  GI bleed - no boluses INR 2 - 3 Monitor platelets by anticoagulation protocol: Yes    Plan:  Increase heparin to 1000 units/hr Check anti-Xa level in 8 hours and daily while on heparin Continue to monitor H&H and platelets Please notify Pharmacy and MD if bleeding occurs   Thank you for allowing [place] to participate in this patients care.   Jens Som, PharmD Please utilize Amion (under [place]) for appropriate number for your unit pharmacist. 02/08/2018 8:24 PM

## 2018-02-09 DIAGNOSIS — D490 Neoplasm of unspecified behavior of digestive system: Secondary | ICD-10-CM

## 2018-02-09 DIAGNOSIS — K922 Gastrointestinal hemorrhage, unspecified: Secondary | ICD-10-CM

## 2018-02-09 DIAGNOSIS — D508 Other iron deficiency anemias: Secondary | ICD-10-CM

## 2018-02-09 LAB — HEPARIN LEVEL (UNFRACTIONATED): Heparin Unfractionated: 0.38 IU/mL (ref 0.30–0.70)

## 2018-02-09 LAB — GLUCOSE, CAPILLARY
GLUCOSE-CAPILLARY: 189 mg/dL — AB (ref 70–99)
Glucose-Capillary: 93 mg/dL (ref 70–99)
Glucose-Capillary: 157 mg/dL — ABNORMAL HIGH (ref 70–99)
Glucose-Capillary: 181 mg/dL — ABNORMAL HIGH (ref 70–99)

## 2018-02-09 LAB — BASIC METABOLIC PANEL
Anion gap: 7 (ref 5–15)
BUN: 12 mg/dL (ref 8–23)
CO2: 24 mmol/L (ref 22–32)
Calcium: 8.7 mg/dL — ABNORMAL LOW (ref 8.9–10.3)
Chloride: 109 mmol/L (ref 98–111)
Creatinine, Ser: 1.37 mg/dL — ABNORMAL HIGH (ref 0.61–1.24)
GFR calc Af Amer: 53 mL/min — ABNORMAL LOW (ref >60)
GFR, EST NON AFRICAN AMERICAN: 46 mL/min — AB (ref >60)
GLUCOSE: 105 mg/dL — AB (ref 70–99)
Potassium: 3.5 mmol/L (ref 3.5–5.1)
Sodium: 140 mmol/L (ref 135–145)

## 2018-02-09 LAB — PROTIME-INR
INR: 1.3
Prothrombin Time: 16.1 seconds — ABNORMAL HIGH (ref 11.4–15.2)

## 2018-02-09 LAB — PREPARE RBC (CROSSMATCH)

## 2018-02-09 MED ORDER — SODIUM CHLORIDE 0.9% IV SOLUTION: Freq: Once | INTRAVENOUS | Status: DC

## 2018-02-09 MED ORDER — WARFARIN SODIUM 5 MG PO TABS
5.0000 mg | ORAL_TABLET | Freq: Once | ORAL | Status: AC
  Administered 2018-02-09: 5 mg via ORAL
  Filled 2018-02-09: qty 1

## 2018-02-09 MED ORDER — WARFARIN SODIUM 5 MG PO TABS
5.0000 mg | ORAL_TABLET | Freq: Once | ORAL | Status: DC
  Filled 2018-02-09: qty 1

## 2018-02-09 NOTE — Progress Notes (Signed)
PROGRESS NOTE  Luis Palmer  WRU:[id number] DOB: 03/31/1933 DOA: 01/25/2018 PCP: Idelle Crouch, MD  Outpatient Specialists: [facility] Brief Narrative: Luis Palmer is an 84 year old male with a history of ischemic cardiomyopathy, status post ICD, chronic atrial fibrillation status post ablation x2 on anticoagulation, CAD with history of CABG, chronic systolic heart failure who was initially admitted to [facility] 8/25 for work up of BRBPR, given Kcentra to reverse anticoagulation and transfused PRBC.He underwent colonoscopy showing colon mass in the ascending colon that was biopsied showing adenocarcinoma. He requested transfer to [facility] where he underwent laparoscopic assisted extended right hemicolectomy on 01/30/2018. Postoperative course has been complicated by anemia with anticoagulation being held. IV iron has been given and heparin bridging back to coumadin has been started. Continues to have drain in place. Due to significant anemia and significant bleeding risk, he will remain inpatient while bridging.   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  - Postop orders per general surgery, including drain management. - Follow up with oncology as an outpatient  Acute blood loss anemia on iron deficiency anemia: Due to GI bleeding from malignancy and postoperatively.  - Giving IV iron - With hgb < 8 consistently, orthostatic hypotension, and significant CAD, I would favor 1u PRBCs tonight, recheck in AM - Monitor CBC daily and for S/Sxs  of bleeding while restarting anticoagulation.   Chronic atrial fibrillation:  - Resumed coumadin and heparin 9/12 for CHA2DS2-VASc score of 5.  - Cardiology following, on coreg with controlled rate  Acute on chronic HFrEF, ICM, CAD s/p CABG: No chest pain. EF 20%, currently euvolemic. - Continue beta blocker, lasix and statin. ARB limited by hypotension.   T2DM:  - Continue SSI  Hypokalemia: Replaced.  - Monitor  Mild dementia without behavioral disturbance:  - Continue aricept - Delirium precautions  Hyperlipidemia:  - Crestor, zetia  DVT prophylaxis: Heparin, coumadin Code Status: Full Family Communication: Wife at bedside Disposition Plan: Home once INR therapeutic and drain removed per surgery. Will need home health PT and rolling walker DME.  Consultants:   Cardiology  General surgery  Procedures:   01/30/18 LAPAROSCOPIC ASSISTED EXTENDED RIGHT COLECTOMY Autumn Messing III, MD   Antimicrobials:  Perioperative cefotetan 9/3  Subjective: Feeling diffusely weak but less blood in stool. No significant abdominal pain.   Objective: Vitals:   02/08/18 2046 02/09/18 0500 02/09/18 0524 02/09/18 1138  BP: (!) 119/47  117/61 (!) 95/53  Pulse: 69  63 63  Resp: 16  16 16   Temp: 98.2 F (36.8 C)  98.5 F (36.9 C) 98.4 F (36.9 C)  TempSrc: Oral  Oral Oral  SpO2: 99%  98% 98%  Weight:  100.3 kg    Height:        Intake/Output Summary (Last 24 hours) at 02/09/2018 1635 Last data filed at 02/09/2018 1440 Gross per 24 hour  Intake 1235.45 ml  Output 75 ml  Net 1160.45 ml   Filed Weights   02/07/18 0500 02/08/18 0500 02/09/18 0500  Weight: 106.6 kg 100.4 kg 100.3 kg    Gen: Pale elderly male in  no distress  Pulm: Non-labored breathing. Clear to auscultation bilaterally.  CV: Irreg w/pacing. No murmur, rub, or gallop. No JVD, no pedal edema. GI: Abdomen soft, minimally tender with midline incision stapled, good apposition, c/d/i, left quadrant JP drain with  serosanguinous output. Nondistended +BS Ext: Warm, no deformities Skin: As above, otherwise no rashes, lesions or ulcers Neuro: Alert, conversant. No focal neurological deficits. Psych: Judgement and insight appear fair. Mood & affect appropriate.   Data Reviewed: I have personally reviewed following labs and imaging studies  CBC: Recent Labs  Lab 02/04/18 0531 02/05/18 0703 02/06/18 0617 02/07/18 0548 02/08/18 0455  WBC 4.3 4.2 8.7 7.1 5.2  HGB 7.4* 7.4* 7.6* 7.2* 7.3*  HCT 23.6* 24.0* 24.6* 23.6* 23.9*  MCV 88.4 89.2 89.5 89.7 90.9  PLT 155 136* 147* 133* 076*   Basic Metabolic Panel: Recent Labs  Lab 02/04/18 0531 02/05/18 0703 02/06/18 0617 02/07/18 0548 02/09/18 0500  NA 139 142 139 140 140  K 3.7 3.7 3.2* 4.3 3.5  CL 103 106 104 103 109  CO2 28 28 26 24 24   GLUCOSE 129* 142* 127* 119* 105*  BUN 11 11 13 14 12   CREATININE 1.39* 1.36* 1.48* 1.50* 1.37*  CALCIUM 8.6* 8.8* 8.8* 9.2 8.7*  MG  --   --  1.8  --   --    GFR: Estimated Creatinine Clearance: 50.1 mL/min (A) (by C-G formula based on SCr of 1.37 mg/dL (H)). Liver Function Tests: No results for input(s): AST, ALT, ALKPHOS, BILITOT, PROT, ALBUMIN in the last 168 hours. No results for input(s): LIPASE, AMYLASE in the last 168 hours. No results for input(s): AMMONIA in the last 168 hours. Coagulation Profile: Recent Labs  Lab 02/05/18 0703 02/06/18 0617 02/07/18 0548 02/08/18 0455 02/09/18 0500  INR 1.41 1.45 1.42 1.35 1.30   Cardiac Enzymes: No results for input(s): CKTOTAL, CKMB, CKMBINDEX, TROPONINI in the last 168 hours. BNP (last 3 results) No results for input(s): PROBNP in the last 8760 hours. HbA1C: No results for input(s): HGBA1C in the last 72 hours. CBG: Recent Labs  Lab 02/08/18 1651 02/08/18 1802 02/08/18 2208 02/09/18 0827 02/09/18 1239  GLUCAP 150* 151* 150* 93 157*   Lipid Profile: No results for input(s): CHOL, HDL, LDLCALC, TRIG, CHOLHDL, LDLDIRECT in the last 72  hours. Thyroid Function Tests: No results for input(s): TSH, T4TOTAL, FREET4, T3FREE, THYROIDAB in the last 72 hours. Anemia Panel: Recent Labs    02/07/18 0811  VITAMINB12 425  FOLATE 12.1  FERRITIN 100  TIBC 235*  IRON 24*  RETICCTPCT 4.1*   Urine analysis:    Component Value Date/Time   COLORURINE YELLOW (A) 01/21/2018 1121   APPEARANCEUR CLEAR (A) 01/21/2018 1121   APPEARANCEUR Clear 08/04/2013 1256   LABSPEC 1.009 01/21/2018 1121   LABSPEC 1.012 08/04/2013 1256   PHURINE 5.0 01/21/2018 1121   GLUCOSEU NEGATIVE 01/21/2018 1121   GLUCOSEU 50 mg/dL 08/04/2013 1256   HGBUR NEGATIVE 01/21/2018 [place] 01/21/2018 1121   BILIRUBINUR Negative 08/04/2013 1256   KETONESUR NEGATIVE 01/21/2018 1121   PROTEINUR NEGATIVE 01/21/2018 1121   NITRITE NEGATIVE 01/21/2018 1121   LEUKOCYTESUR NEGATIVE 01/21/2018 1121   LEUKOCYTESUR Negative 08/04/2013 1256   No results found for this or any previous visit (from the past 240 hour(s)).    Radiology Studies: No results found.  Scheduled Meds: . sodium chloride   Intravenous Once  . acetaminophen  1,000 mg Oral Q8H  . carvedilol  12.5 mg Oral BID WC  .  cholecalciferol  2,000 Units Oral Daily  . donepezil  10 mg Oral QHS  . ezetimibe  10 mg Oral QHS  . feeding supplement (GLUCERNA SHAKE)  237 mL Oral TID BM  . insulin aspart  0-5 Units Subcutaneous QHS  . insulin aspart  0-9 Units Subcutaneous TID WC  . insulin glargine  5 Units Subcutaneous QHS  . methocarbamol  500 mg Oral Q8H  . multivitamin with minerals  1 tablet Oral Daily  . pantoprazole  40 mg Oral BID  . polyethylene glycol  17 g Oral Daily  . rosuvastatin  10 mg Oral Daily  . tamsulosin  0.4 mg Oral QHS  . warfarin  5 mg Oral ONCE-1800  . Warfarin - Pharmacist Dosing Inpatient   Does not apply q1800   Continuous Infusions: . dextrose 5 % and 0.9 % NaCl with KCl 20 mEq/L 10 mL/hr at 02/02/18 1318  . heparin 1,000 Units/hr (02/09/18 1152)  .  lactated ringers Stopped (01/30/18 1336)     LOS: 15 days   Time spent: 25 minutes.  Patrecia Pour, MD [facility] [email or web address] Password [facility] 02/09/2018, 4:35 PM

## 2018-02-09 NOTE — Progress Notes (Signed)
   02/09/18 1600  Clinical Encounter Type  Visited With Patient and family together;Health care provider  Visit Type Initial;Other (Comment) (length of stay)   Chaplain visited while rounding, visit selected due to pt's length of hospitalization.  Introduced self to pt and his spouse and joined this on an afternoon lap around the unit since pt was already starting on that.  Chaplain remains available for consult request.  Myra Gianotti resident, [phone]

## 2018-02-09 NOTE — Progress Notes (Signed)
Pt not ready to go on CPAP yet. Advised pt to have RN notify for RT if pt requires any assistance. RT will continue to monitor.

## 2018-02-09 NOTE — Progress Notes (Addendum)
Progress Note  Patient Name: Luis Palmer Date of Encounter: 02/09/2018  Primary Cardiologist:  [facility]  Subjective   Doing well this AM. No pain. Decreasing output from drain.  Inpatient Medications    Scheduled Meds: . sodium chloride   Intravenous Once  . acetaminophen  1,000 mg Oral Q8H  . carvedilol  12.5 mg Oral BID WC  . cholecalciferol  2,000 Units Oral Daily  . donepezil  10 mg Oral QHS  . ezetimibe  10 mg Oral QHS  . feeding supplement (GLUCERNA SHAKE)  237 mL Oral TID BM  . insulin aspart  0-5 Units Subcutaneous QHS  . insulin aspart  0-9 Units Subcutaneous TID WC  . insulin glargine  5 Units Subcutaneous QHS  . methocarbamol  500 mg Oral Q8H  . multivitamin with minerals  1 tablet Oral Daily  . pantoprazole  40 mg Oral BID  . polyethylene glycol  17 g Oral Daily  . rosuvastatin  10 mg Oral Daily  . tamsulosin  0.4 mg Oral QHS  . warfarin  5 mg Oral ONCE-1800  . Warfarin - Pharmacist Dosing Inpatient   Does not apply q1800   Continuous Infusions: . dextrose 5 % and 0.9 % NaCl with KCl 20 mEq/L 10 mL/hr at 02/02/18 1318  . heparin 1,000 Units/hr (02/09/18 1000)  . iron dextran (INFED/DEXFERRRUM) 500 MG IVPB 86.7 mL/hr at 02/09/18 1000  . lactated ringers Stopped (01/30/18 1336)   PRN Meds: ondansetron **OR** ondansetron (ZOFRAN) IV, oxyCODONE   Vital Signs    Vitals:   02/08/18 2046 02/09/18 0500 02/09/18 0524 02/09/18 1138  BP: (!) 119/47  117/61 (!) 95/53  Pulse: 69  63 63  Resp: 16  16 16   Temp: 98.2 F (36.8 C)  98.5 F (36.9 C) 98.4 F (36.9 C)  TempSrc: Oral  Oral Oral  SpO2: 99%  98% 98%  Weight:  100.3 kg    Height:        Intake/Output Summary (Last 24 hours) at 02/09/2018 1140 Last data filed at 02/09/2018 1017 Gross per 24 hour  Intake 1385.45 ml  Output 140 ml  Net 1245.45 ml   Filed Weights   02/07/18 0500 02/08/18 0500 02/09/18 0500  Weight: 106.6 kg 100.4 kg 100.3 kg    Physical Exam   GEN: No acute  distress.   Neck: No JVD Cardiac: IRIR, no murmurs, rubs, or gallops. Respiratory: Clear to auscultation bilaterally. GI: drain with serosanguinous output, s/p abdominal surgery  MS: No edema; No deformity. Neuro:  Nonfocal  Psych: Normal affect   Labs    Chemistry Recent Labs  Lab 02/06/18 0617 02/07/18 0548 02/09/18 0500  NA 139 140 140  K 3.2* 4.3 3.5  CL 104 103 109  CO2 26 24 24   GLUCOSE 127* 119* 105*  BUN 13 14 12   CREATININE 1.48* 1.50* 1.37*  CALCIUM 8.8* 9.2 8.7*  GFRNONAA 42* 41* 46*  GFRAA 49* 48* 53*  ANIONGAP 9 13 7      Hematology Recent Labs  Lab 02/06/18 0617 02/07/18 0548 02/07/18 0811 02/08/18 0455  WBC 8.7 7.1  --  5.2  RBC 2.75* 2.63* 2.58* 2.63*  HGB 7.6* 7.2*  --  7.3*  HCT 24.6* 23.6*  --  23.9*  MCV 89.5 89.7  --  90.9  MCH 27.6 27.4  --  27.8  MCHC 30.9 30.5  --  30.5  RDW 17.1* 17.7*  --  18.1*  PLT 147* 133*  --  131*  Cardiac EnzymesNo results for input(s): TROPONINI in the last 168 hours. No results for input(s): TROPIPOC in the last 168 hours.   BNPNo results for input(s): BNP, PROBNP in the last 168 hours.   DDimer No results for input(s): DDIMER in the last 168 hours.   Radiology    No results found.  Cardiac Studies   01/24/2018 TTE - Left ventricle: The cavity size was severely dilated. Systolic   function was severely reduced. The estimated ejection fraction   was 20%. Diffuse hypokinesis. - Aortic valve: Valve area (Vmax): 2.16 cm^2. - Mitral valve: There was mild to moderate regurgitation. - Left atrium: The atrium was mildly dilated. - Right ventricle: The cavity size was mildly dilated. - Right atrium: The atrium was mildly dilated. - Tricuspid valve: There was mild-moderate regurgitation.  Patient Profile     84 y.o. male male with a history of known chronic systolic congestive heart failure, ICM s/p ICD, Chronic Afib s/p ablation x2, MDT VIVA XT CRT-D,  coronary artery disease-status post coronary  artery bypass grafting.  He did was admitted 08/29 with GI bleed. Colonoscopy showed mass; colectomy 01/30/18; pathology showed adenocarcinoma.   Assessment & Plan    Chronic Atrial Fibrillation - Carvedilol for rate control. Continue. Has CRT-D. - This is day 2 of heparin/coumadin restart. Tolerating well thus far, no bleeding from site, drain is still minimal output and slightly sanguinous. - Hgb 7.3 today, consistent with post surgical blood loss anemia. Receiving IV iron for iron deficiency. - Continue daily CBC, BMP.  Chronic Systolic Congestive Heart Failure, ICM, and CAD s/p CABG - Most recent echo as above in cardiac studies with EF 20%. -Admission weight was 106.5 kg, peak 108.1, lowest 99.1 kg. Today he is 100.3, and per records he is net negative 5 liters this admission (which may be underestimated, as he has been voiding without recording output). He has been orthostatic this hospitalization, though this was improved on most recent recheck several days ago.  - Holding furosemide and losartan. Bp have been stable though on the low end. Weights as above.  - Continue Carvedilol, Zetia, Statin therapy. - CDT-D in place. - Strict I/O, daily weights, volume status.  CKD III - holding lasix and losartan - Continue to monitor renal function, electrolytes with BMP. Aim for potassium of 4. Creatinine stable at 1.37 today.  HTN - has had postoperative hypotension, monitoring. Will hold on restarting losartan/furosemide.  DM2 - SSI - Per IM  Adenocarcinoma s/p resection of tumor and associated acute blood loss anemia - Blood loss secondary to rectal bleeding with adenicarcinoma; EGD showed gastritis - Hemoglobin as above shows drop - continue to monitor and transfuse for Hgb <7 - anticoagulation as above. - Continue to monitor for bloody stool - Per General Surgery    Would ideally like for him to have a therapeutic INR prior to discharge to monitor for evidence of bleeding. We  will continue to follow.  For questions or updates, please contact [place] Please consult [email or web address] for contact info under     Signed, Buford Dresser, MD  02/09/2018, 11:40 AM

## 2018-02-09 NOTE — Progress Notes (Addendum)
10 Days Post-Op    CC:  Adenocarcinoma colon  Subjective: He is pretty stable this AM.  No blood in his stool, CBC is pending, tolerating diet well.  Started on heparin/coumadin yesterday.  Midline incision looks fine, drain is mostly serous and decreasing.  Have not repeated orthostatics over last 24 hours.  Objective: Vital signs in last 24 hours: Temp:  [97.9 F (36.6 C)-98.5 F (36.9 C)] 98.5 F (36.9 C) (09/13 0524) Pulse Rate:  [59-69] 63 (09/13 0524) Resp:  [16-18] 16 (09/13 0524) BP: (111-143)/(47-113) 117/61 (09/13 0524) SpO2:  [98 %-100 %] 98 % (09/13 0524) Weight:  [100.3 kg] 100.3 kg (09/13 0500) Last BM Date: 02/07/18 900 PO 300 IV Drain 140 No aBM recorded Afebrile, VSS Creatinine is better down to 1.37 CBC is pending INR 1.30 Heparin lever 0.38 - theraputic Intake/Output from previous day: 09/12 0701 - 09/13 0700 In: 1348.9 [P.O.:900; I.V.:188.9; IV Piggyback:260] Out: 140 [Drains:140] Intake/Output this shift: No intake/output data recorded.  General appearance: alert, cooperative and no distress Resp: clear to auscultation bilaterally GI: soft, normal postop discomfort.  Incision looks fine.  Drain is serous and mostly clear.  Lab Results:  Recent Labs    02/07/18 0548 02/08/18 0455  WBC 7.1 5.2  HGB 7.2* 7.3*  HCT 23.6* 23.9*  PLT 133* 131*    BMET Recent Labs    02/07/18 0548 02/09/18 0500  NA 140 140  K 4.3 3.5  CL 103 109  CO2 24 24  GLUCOSE 119* 105*  BUN 14 12  CREATININE 1.50* 1.37*  CALCIUM 9.2 8.7*   PT/INR Recent Labs    02/08/18 0455 02/09/18 0500  LABPROT 16.6* 16.1*  INR 1.35 1.30    No results for input(s): AST, ALT, ALKPHOS, BILITOT, PROT, ALBUMIN in the last 168 hours.   Lipase     Component Value Date/Time   LIPASE 47 01/25/2018 0508     Medications: . sodium chloride   Intravenous Once  . acetaminophen  1,000 mg Oral Q8H  . carvedilol  12.5 mg Oral BID WC  . cholecalciferol  2,000 Units Oral Daily   . donepezil  10 mg Oral QHS  . ezetimibe  10 mg Oral QHS  . feeding supplement (GLUCERNA SHAKE)  237 mL Oral TID BM  . insulin aspart  0-5 Units Subcutaneous QHS  . insulin aspart  0-9 Units Subcutaneous TID WC  . insulin glargine  5 Units Subcutaneous QHS  . methocarbamol  500 mg Oral Q8H  . multivitamin with minerals  1 tablet Oral Daily  . pantoprazole  40 mg Oral BID  . polyethylene glycol  17 g Oral Daily  . rosuvastatin  10 mg Oral Daily  . tamsulosin  0.4 mg Oral QHS  . warfarin  5 mg Oral Once  . Warfarin - Pharmacist Dosing Inpatient   Does not apply q1800   . dextrose 5 % and 0.9 % NaCl with KCl 20 mEq/L 10 mL/hr at 02/02/18 1318  . heparin 1,000 Units/hr (02/09/18 0646)  . iron dextran (INFED/DEXFERRRUM) 500 MG IVPB Stopped (02/08/18 1700)  . lactated ringers Stopped (01/30/18 1336)    Assessment/Plan  Type 2 diabetes mellitus- SSI Atrial fibrillation- per primary team, on SQ heparin Chronic systolicCHF- last EF around 20%, management of fluid status per cardiology Hx of CABG Acute on ChronicAnemia; Hx of prior iron infusions- H/H 8.8/28.1,>> 7.4/23.6>> 7.2/23.6 Tobacco use x 40 years/none x 33 years Hx of mild dementia OSA/CPAP Orthostatic Hypotension post op  GI bleed withTransverse colonadenocarcinoma - s/p laparoscopic assisted extended right hemicolectomy 01/30/18 Dr. Toth//Cystoscopy with foley placement Dr. Alexis Frock - POD#10 - drainoutput decreasing, serous,200 cc -regular. - continue to mobilize -Hemoglobin/hematocrit isstable. Less blood in stool yesterday.  OBT:[id number] VTE: heparin gtt and then transition to coumadinper primary- heparin restarted 9/13 ID: [email or web address] periop Follow up:Dr. Marlou Starks  Plan: We will plan to remove staples and drain prior to discharge.  Continue to follow, ongoing medical management for anticoagulation and cardiomyopathy.   .   LOS: 15 days     Sheri Gatchel 02/09/2018 [phone]

## 2018-02-09 NOTE — Progress Notes (Addendum)
ANTICOAGULATION CONSULT NOTE  Pharmacy Consult:  Heparin / Coumadin Indication: atrial fibrillation  Allergies  Allergen Reactions  . Pseudoephedrine Hcl Other (See Comments)    Other Reaction: ANURIA  . Ace Inhibitors Cough and Other (See Comments)    cough cough   . Nsaids Other (See Comments)    Reaction:  Fluid retention   . Vasotec [Enalapril] Cough    Patient Measurements: Height: 6' (182.9 cm) Weight: 221 lb 1.6 oz (100.3 kg) IBW/kg (Calculated) : 77.6 Heparin Dosing Weight: 98 kg  Vital Signs: Temp: 98.5 F (36.9 C) (09/13 0524) Temp Source: Oral (09/13 0524) BP: 117/61 (09/13 0524) Pulse Rate: 63 (09/13 0524)  Labs: Recent Labs    02/07/18 0548 02/08/18 0455 02/08/18 1912 02/09/18 0500  HGB 7.2* 7.3*  --   --   HCT 23.6* 23.9*  --   --   PLT 133* 131*  --   --   LABPROT 17.2* 16.6*  --  16.1*  INR 1.42 1.35  --  1.30  HEPARINUNFRC  --   --  0.20* 0.38  CREATININE 1.50*  --   --  1.37*    Estimated Creatinine Clearance: 50.1 mL/min (A) (by C-G formula based on SCr of 1.37 mg/dL (H)).   Assessment: 13 YOM on Coumadin PTA for atrial fibrillation.  Med held on admit for right hemicolectomy due to colon tumor on 01/30/18. Pharmacy consulted to manage heparin drip post-op and was to resume Coumadin on 9/8; however, pt with bloody stool and serosangous drainage from JP drain 9/8 so heparin and coumadin held. No blood reported in stool from yesterday and Hgb low but stable at 7.3. Plt remain low but relatively stable at 131. INR down to 1.30.  Cardiology ok'd restarting anticoagulation on 9/12.  Heparin level this AM is therapeutic (0.38) for tight goal of 0.3 to 0.5 on 1000 units/hr.  No bleeding noted by RN. Home coumadin dose: 5mg  daily except for 2.5mg  on Tues/Thu/Sat    Goal of Therapy:  Heparin level 0.3-0.5 units/ml with recent GI bleed - no boluses INR 2 - 3 Monitor platelets by anticoagulation protocol: Yes    Plan:  Continue heparin gtt at  1000 units/hr  Will f/u heparin level in 8 hours Coumadin 5mg  again tonight at 1800 PM.  Daily PT / INR, heparin level and CBC  Sloan Leiter, PharmD, BCPS, BCCCP Clinical Pharmacist Clinical phone 02/09/2018 until 3:30PM - #[phone] Please refer to [facility] for [place] numbers 02/09/2018, 7:39 AM

## 2018-02-09 NOTE — Progress Notes (Signed)
RT check on pt to hlep place him on CPAP.  Pt had already placed himself on CPAP. Pt seemed comfortable.

## 2018-02-10 LAB — HEPARIN LEVEL (UNFRACTIONATED): HEPARIN UNFRACTIONATED: 0.36 [IU]/mL (ref 0.30–0.70)

## 2018-02-10 LAB — CBC
HCT: 27.1 % — ABNORMAL LOW (ref 39.0–52.0)
Hemoglobin: 8.4 g/dL — ABNORMAL LOW (ref 13.0–17.0)
MCH: 28.5 pg (ref 26.0–34.0)
MCHC: 31 g/dL (ref 30.0–36.0)
MCV: 91.9 fL (ref 78.0–100.0)
PLATELETS: 139 10*3/uL — AB (ref 150–400)
RBC: 2.95 MIL/uL — AB (ref 4.22–5.81)
RDW: 18.2 % — ABNORMAL HIGH (ref 11.5–15.5)
WBC: 4.6 10*3/uL (ref 4.0–10.5)

## 2018-02-10 LAB — BPAM RBC
BLOOD PRODUCT EXPIRATION DATE: 201909302359
ISSUE DATE / TIME: 201909132304
UNIT TYPE AND RH: 9500

## 2018-02-10 LAB — BASIC METABOLIC PANEL
Anion gap: 7 (ref 5–15)
BUN: 11 mg/dL (ref 8–23)
CO2: 24 mmol/L (ref 22–32)
CREATININE: 1.19 mg/dL (ref 0.61–1.24)
Calcium: 8.8 mg/dL — ABNORMAL LOW (ref 8.9–10.3)
Chloride: 109 mmol/L (ref 98–111)
GFR calc Af Amer: >60 mL/min (ref >60)
GFR, EST NON AFRICAN AMERICAN: 55 mL/min — AB (ref >60)
Glucose, Bld: 101 mg/dL — ABNORMAL HIGH (ref 70–99)
Potassium: 3.3 mmol/L — ABNORMAL LOW (ref 3.5–5.1)
SODIUM: 140 mmol/L (ref 135–145)

## 2018-02-10 LAB — PROTIME-INR
INR: 1.37
PROTHROMBIN TIME: 16.7 s — AB (ref 11.4–15.2)

## 2018-02-10 LAB — TYPE AND SCREEN
ABO/RH(D): O NEG
Antibody Screen: NEGATIVE
Unit division: 0

## 2018-02-10 LAB — GLUCOSE, CAPILLARY
GLUCOSE-CAPILLARY: 139 mg/dL — AB (ref 70–99)
Glucose-Capillary: 94 mg/dL (ref 70–99)
Glucose-Capillary: 120 mg/dL — ABNORMAL HIGH (ref 70–99)
Glucose-Capillary: 185 mg/dL — ABNORMAL HIGH (ref 70–99)

## 2018-02-10 MED ORDER — WARFARIN SODIUM 7.5 MG PO TABS
7.5000 mg | ORAL_TABLET | Freq: Once | ORAL | Status: AC
  Administered 2018-02-10: 7.5 mg via ORAL
  Filled 2018-02-10: qty 1

## 2018-02-10 MED ORDER — POTASSIUM CHLORIDE CRYS ER 20 MEQ PO TBCR
40.0000 meq | EXTENDED_RELEASE_TABLET | Freq: Once | ORAL | Status: AC
  Administered 2018-02-10: 40 meq via ORAL
  Filled 2018-02-10: qty 2

## 2018-02-10 NOTE — Progress Notes (Signed)
Pt able to place himself on CPAP when ready. RT will continue to monitor.

## 2018-02-10 NOTE — Progress Notes (Signed)
Progress Note  Patient Name: Luis Palmer Date of Encounter: 02/10/2018  Primary Cardiologist:  Uncertain Cardiology  Subjective   Doing well overall, up ambulating the halls today. No gross blood in stools. Fatigued but overall no new concerns. No chest pain, SOB, lightheadedness or syncope.  Inpatient Medications    Scheduled Meds: . sodium chloride   Intravenous Once  . sodium chloride   Intravenous Once  . acetaminophen  1,000 mg Oral Q8H  . carvedilol  12.5 mg Oral BID WC  . cholecalciferol  2,000 Units Oral Daily  . donepezil  10 mg Oral QHS  . ezetimibe  10 mg Oral QHS  . feeding supplement (GLUCERNA SHAKE)  237 mL Oral TID BM  . insulin aspart  0-5 Units Subcutaneous QHS  . insulin aspart  0-9 Units Subcutaneous TID WC  . insulin glargine  5 Units Subcutaneous QHS  . methocarbamol  500 mg Oral Q8H  . multivitamin with minerals  1 tablet Oral Daily  . pantoprazole  40 mg Oral BID  . polyethylene glycol  17 g Oral Daily  . rosuvastatin  10 mg Oral Daily  . tamsulosin  0.4 mg Oral QHS  . warfarin  7.5 mg Oral ONCE-1800  . Warfarin - Pharmacist Dosing Inpatient   Does not apply q1800   Continuous Infusions: . dextrose 5 % and 0.9 % NaCl with KCl 20 mEq/L 10 mL/hr at 02/02/18 1318  . heparin 1,000 Units/hr (02/09/18 1200)  . lactated ringers Stopped (01/30/18 1336)   PRN Meds: ondansetron **OR** ondansetron (ZOFRAN) IV, oxyCODONE   Vital Signs    Vitals:   02/09/18 2230 02/09/18 2326 02/10/18 0139 02/10/18 0500  BP: (!) 110/50 (!) 109/51 (!) 117/52 (!) 117/54  Pulse: 69 67 67 69  Resp: 18 18 18 18   Temp: 98 F (36.7 C) 97.9 F (36.6 C) 97.9 F (36.6 C) 97.6 F (36.4 C)  TempSrc: Oral Oral Oral Tympanic  SpO2: 100% 97% 99% 99%  Weight:      Height:        Intake/Output Summary (Last 24 hours) at 02/10/2018 1302 Last data filed at 02/10/2018 0600 Gross per 24 hour  Intake 450 ml  Output 105 ml  Net 345 ml   Filed Weights   02/07/18 0500 02/08/18  0500 02/09/18 0500  Weight: 106.6 kg 100.4 kg 100.3 kg    Physical Exam   GEN: No acute distress.   Neck: No JVD Cardiac: IRIR, no murmurs, rubs, or gallops. Respiratory: Clear to auscultation bilaterally. GI: +BS, post abdominal surgery MS: No edema; No deformity. Neuro:  Nonfocal  Psych: Normal affect   Labs    Chemistry Recent Labs  Lab 02/07/18 0548 02/09/18 0500 02/10/18 0554  NA 140 140 140  K 4.3 3.5 3.3*  CL 103 109 109  CO2 24 24 24   GLUCOSE 119* 105* 101*  BUN 14 12 11   CREATININE 1.50* 1.37* 1.19  CALCIUM 9.2 8.7* 8.8*  GFRNONAA 41* 46* 55*  GFRAA 48* 53* >60  ANIONGAP 13 7 7      Hematology Recent Labs  Lab 02/07/18 0548 02/07/18 0811 02/08/18 0455 02/10/18 0554  WBC 7.1  --  5.2 4.6  RBC 2.63* 2.58* 2.63* 2.95*  HGB 7.2*  --  7.3* 8.4*  HCT 23.6*  --  23.9* 27.1*  MCV 89.7  --  90.9 91.9  MCH 27.4  --  27.8 28.5  MCHC 30.5  --  30.5 31.0  RDW 17.7*  --  18.1* 18.2*  PLT 133*  --  131* 139*    Cardiac EnzymesNo results for input(s): TROPONINI in the last 168 hours. No results for input(s): TROPIPOC in the last 168 hours.   BNPNo results for input(s): BNP, PROBNP in the last 168 hours.   DDimer No results for input(s): DDIMER in the last 168 hours.   Radiology    No results found.  Cardiac Studies   01/24/2018 TTE - Left ventricle: The cavity size was severely dilated. Systolic   function was severely reduced. The estimated ejection fraction   was 20%. Diffuse hypokinesis. - Aortic valve: Valve area (Vmax): 2.16 cm^2. - Mitral valve: There was mild to moderate regurgitation. - Left atrium: The atrium was mildly dilated. - Right ventricle: The cavity size was mildly dilated. - Right atrium: The atrium was mildly dilated. - Tricuspid valve: There was mild-moderate regurgitation.  Patient Profile     84 y.o. male male with a history of known chronic systolic congestive heart failure, ICM s/p ICD, Chronic Afib s/p ablation x2, MDT  VIVA XT CRT-D,  coronary artery disease-status post coronary artery bypass grafting.  He did was admitted 08/29 with GI bleed. Colonoscopy showed mass; colectomy 01/30/18; pathology showed adenocarcinoma.   Assessment & Plan    Chronic Atrial Fibrillation - Carvedilol for rate control. Continue. Has CRT-D. - Heparin/coumadin restarted on 02/08/18. Tolerating well thus far, no bleeding in bowel movements. - Hgb 8.4 today, consistent with post surgical blood loss anemia. Received IV iron and transfusion 02/09/18 - Continue daily CBC, BMP.  Chronic Systolic Congestive Heart Failure, ICM, and CAD s/p CABG - Most recent echo as above in cardiac studies with EF 20%. -Admission weight was 106.5 kg, peak 108.1, lowest 99.1 kg.  No weight today, but yesterday was 100.3.   - Holding furosemide and losartan. Bp have been stable though on the low end. Weights as above.  - Continue Carvedilol, Zetia, Statin therapy. - CDT-D in place. - Strict I/O, daily weights, volume status.  CKD III - holding lasix and losartan - Continue to monitor renal function, electrolytes with BMP. Aim for potassium of 4. Creatinine stable to improved at 1.19 today.  HTN - has had postoperative hypotension, monitoring. Will hold on restarting losartan/furosemide.  DM2 - SSI - Per IM  Adenocarcinoma s/p resection of tumor and associated acute blood loss anemia - Blood loss secondary to rectal bleeding with adenicarcinoma; EGD showed gastritis - Hemoglobin as above shows drop - continue to monitor and transfuse for Hgb <7 - anticoagulation as above. - Continue to monitor for bloody stool    Would ideally like for him to have a therapeutic INR prior to discharge to monitor for evidence of bleeding. We would consider discharge if his INR is near therapeutic and appropriately trending (with close follow up). We will continue to follow.  For questions or updates, please contact [place] Please consult [email or web address] for  contact info under     Signed, Buford Dresser, MD  02/10/2018, 1:02 PM

## 2018-02-10 NOTE — Progress Notes (Signed)
11 Days Post-Op    CC: Adenocarcinoma of the colon  Subjective: Staples getting red so I have removed them and the drain. Benzoin and steri- stripped the sites.  Tolerating diet, no stool in BM.   Objective: Vital signs in last 24 hours: Temp:  [97.6 F (36.4 C)-98.4 F (36.9 C)] 97.6 F (36.4 C) (09/14 0500) Pulse Rate:  [63-69] 69 (09/14 0500) Resp:  [16-18] 18 (09/14 0500) BP: (95-117)/(50-56) 117/54 (09/14 0500) SpO2:  [96 %-100 %] 99 % (09/14 0500) Last BM Date: 02/08/18 570 p.o. Recorded. 500 IV Voided x3 Drain 105 Stool x3 Afebrile vital signs are stable blood pressure especially his orthostatics are still low. Labs shows hemoglobin/ hematocrit are stable and improving -transfused yesterday BMP is stable INR 1.37   Intake/Output from previous day: 09/13 0701 - 09/14 0700 In: 1060.8 [P.O.:570; I.V.:232.2; IV Piggyback:258.6] Out: 105 [Drains:105] Intake/Output this shift: No intake/output data recorded.  General appearance: alert, cooperative and no distress Resp: clear to auscultation bilaterally GI: Soft, sore, staples and drain removed.  Benzoin and Steri-Strips applied.  Sites all look good.  Tolerated diet with no blood noted with his bowel movements.  Lab Results:  Recent Labs    02/08/18 0455 02/10/18 0554  WBC 5.2 4.6  HGB 7.3* 8.4*  HCT 23.9* 27.1*  PLT 131* 139*    BMET Recent Labs    02/09/18 0500 02/10/18 0554  NA 140 140  K 3.5 3.3*  CL 109 109  CO2 24 24  GLUCOSE 105* 101*  BUN 12 11  CREATININE 1.37* 1.19  CALCIUM 8.7* 8.8*   PT/INR Recent Labs    02/09/18 0500 02/10/18 0554  LABPROT 16.1* 16.7*  INR 1.30 1.37    No results for input(s): AST, ALT, ALKPHOS, BILITOT, PROT, ALBUMIN in the last 168 hours.   Lipase     Component Value Date/Time   LIPASE 47 01/25/2018 0508     Medications: . sodium chloride   Intravenous Once  . sodium chloride   Intravenous Once  . acetaminophen  1,000 mg Oral Q8H  . carvedilol   12.5 mg Oral BID WC  . cholecalciferol  2,000 Units Oral Daily  . donepezil  10 mg Oral QHS  . ezetimibe  10 mg Oral QHS  . feeding supplement (GLUCERNA SHAKE)  237 mL Oral TID BM  . insulin aspart  0-5 Units Subcutaneous QHS  . insulin aspart  0-9 Units Subcutaneous TID WC  . insulin glargine  5 Units Subcutaneous QHS  . methocarbamol  500 mg Oral Q8H  . multivitamin with minerals  1 tablet Oral Daily  . pantoprazole  40 mg Oral BID  . polyethylene glycol  17 g Oral Daily  . rosuvastatin  10 mg Oral Daily  . tamsulosin  0.4 mg Oral QHS  . warfarin  7.5 mg Oral ONCE-1800  . Warfarin - Pharmacist Dosing Inpatient   Does not apply q1800    Assessment/Plan Type 2 diabetes mellitus- SSI Atrial fibrillation- per primary team, on SQ heparin Chronic systolicCHF- last EF around 20%, management of fluid status per cardiology Hx of CABG Acute on ChronicAnemia; Hx of prior iron infusions- H/H 8.8/28.1,>> 7.4/23.6>> 7.2/23.6 Tobacco use x 40 years/none x 33 years Hx of mild dementia OSA/CPAP Orthostatic Hypotension post op    GI bleed withTransverse colonadenocarcinoma - s/p laparoscopic assisted extended right hemicolectomy 01/30/18 Dr. Toth//Cystoscopy with foley placement Dr. Alexis Frock - POD#11 - drainoutput decreasing, serous,200 cc -regular. - continue to mobilize -Hemoglobin/hematocrit isstable. Less  blood in stool yesterday.  ZOX:[id number] VTE: heparin gtt and then transition to coumadinper primary- heparin restarted 9/13 ID: [id number] periop Follow up:Dr. Marlou Starks 02/20/2018 and 11:10 AM.  Info is in the AVS.  Plan: Staples and drain have been removed.  He can shower tomorrow.  Discharge instructions are in the AVS.  He can be discharged when medically stable.       LOS: 16 days    Luis Palmer 02/10/2018 [phone]

## 2018-02-10 NOTE — Progress Notes (Addendum)
PROGRESS NOTE  Luis Palmer  XHB:[id number] DOB: [birth year] DOA: 01/25/2018 PCP: Idelle Crouch, MD  Outpatient Specialists: [facility] Brief Narrative: Luis Palmer is an 84 year old male with a history of ischemic cardiomyopathy, status post ICD, chronic atrial fibrillation status post ablation x2 on anticoagulation, CAD with history of CABG, chronic systolic heart failure who was initially admitted to [facility] 8/25 for work up of BRBPR, given Kcentra to reverse anticoagulation and transfused PRBC.He underwent colonoscopy showing colon mass in the ascending colon that was biopsied showing adenocarcinoma. He requested transfer to [facility] where he underwent laparoscopic assisted extended right hemicolectomy on 01/30/2018. Postoperative course has been complicated by anemia with anticoagulation being held. IV iron has been given and heparin bridging back to coumadin has been started. Continues to have drain in place. Due to significant anemia and significant bleeding risk, he will remain inpatient while bridging.   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  - Postop orders per general surgery, including drain management. - Follow up with oncology as an outpatient  Acute blood loss anemia on iron deficiency anemia: Due to GI bleeding from malignancy and postoperatively.  - Giving IV iron - s/p 1u PRBCs 9/13.  - Monitor CBC daily and for S/Sxs of bleeding while restarting anticoagulation.   Chronic atrial fibrillation:  - Resumed coumadin  and heparin 9/12 for CHA2DS2-VASc score of 5. Per cardiology, would like therapeutic INR prior to discharge.  - Cardiology following, on coreg with controlled rate  Acute on chronic HFrEF, ICM, CAD s/p CABG: No chest pain. EF 20%, currently euvolemic. - Continue beta blocker, lasix and statin. ARB limited by hypotension.   T2DM:  - Continue SSI  Hypokalemia:  - Replace again today - Monitor  Mild dementia without behavioral disturbance:  - Continue aricept - Delirium precautions  Hyperlipidemia:  - Crestor, zetia  DVT prophylaxis: Heparin, coumadin Code Status: Full Family Communication: Wife, daughter at bedside Disposition Plan: Home once INR therapeutic. Will need home health PT and rolling walker DME which have been ordered.  Consultants:   Cardiology  General surgery  Procedures:   01/30/18 LAPAROSCOPIC ASSISTED EXTENDED RIGHT COLECTOMY Autumn Messing III, MD   Antimicrobials:  Perioperative cefotetan 9/3  Subjective: Feels great today. No bleeding noted. Abdominal pain is minimal.   Objective: Vitals:   02/09/18 2230 02/09/18 2326 02/10/18 0139 02/10/18 0500  BP: (!) 110/50 (!) 109/51 (!) 117/52 (!) 117/54  Pulse: 69 67 67 69  Resp: 18 18 18 18   Temp: 98 F (36.7 C) 97.9 F (36.6 C) 97.9 F (36.6 C) 97.6 F (36.4 C)  TempSrc: Oral Oral Oral Tympanic  SpO2: 100% 97% 99% 99%  Weight:      Height:        Intake/Output Summary (Last 24 hours) at 02/10/2018 1257 Last data filed at 02/10/2018 0600 Gross per 24 hour  Intake 450 ml  Output 105 ml  Net 345 ml   Filed Weights   02/07/18 0500 02/08/18 0500 02/09/18 0500  Weight: 106.6 kg 100.4 kg 100.3 kg   Gen: Pleasant, elderly male in no  distress Pulm: Nonlabored breathing room air. Clear. CV: Irreg w/pacing. No murmur, rub, or gallop. No JVD, no dependent edema. GI: Abdomen soft, only minimally tender, nondistended, +BS.  Ext: Warm, no deformities Skin: Less pallor diffusely. No jaundice. Abdominal  incision sites healing with minimal erythema. Drain removed in interval, dressing c/d/i. Neuro: Alert and oriented. No focal neurological deficits. Psych: Judgement and insight appear fair. Mood euthymic & affect congruent. Behavior is appropriate.    Data Reviewed: I have personally reviewed following labs and imaging studies  CBC: Recent Labs  Lab 02/05/18 0703 02/06/18 0617 02/07/18 0548 02/08/18 0455 02/10/18 0554  WBC 4.2 8.7 7.1 5.2 4.6  HGB 7.4* 7.6* 7.2* 7.3* 8.4*  HCT 24.0* 24.6* 23.6* 23.9* 27.1*  MCV 89.2 89.5 89.7 90.9 91.9  PLT 136* 147* 133* 131* 631*   Basic Metabolic Panel: Recent Labs  Lab 02/05/18 0703 02/06/18 0617 02/07/18 0548 02/09/18 0500 02/10/18 0554  NA 142 139 140 140 140  K 3.7 3.2* 4.3 3.5 3.3*  CL 106 104 103 109 109  CO2 28 26 24 24 24   GLUCOSE 142* 127* 119* 105* 101*  BUN 11 13 14 12 11   CREATININE 1.36* 1.48* 1.50* 1.37* 1.19  CALCIUM 8.8* 8.8* 9.2 8.7* 8.8*  MG  --  1.8  --   --   --    GFR: Estimated Creatinine Clearance: 57.7 mL/min (by C-G formula based on SCr of 1.19 mg/dL). Liver Function Tests: No results for input(s): AST, ALT, ALKPHOS, BILITOT, PROT, ALBUMIN in the last 168 hours. No results for input(s): LIPASE, AMYLASE in the last 168 hours. No results for input(s): AMMONIA in the last 168 hours. Coagulation Profile: Recent Labs  Lab 02/06/18 0617 02/07/18 0548 02/08/18 0455 02/09/18 0500 02/10/18 0554  INR 1.45 1.42 1.35 1.30 1.37   Cardiac Enzymes: No results for input(s): CKTOTAL, CKMB, CKMBINDEX, TROPONINI in the last 168 hours. BNP (last 3 results) No results for input(s): PROBNP in the last 8760 hours. HbA1C: No results for input(s): HGBA1C in the last 72 hours. CBG: Recent Labs  Lab 02/09/18 1239 02/09/18 1701 02/09/18 2145 02/10/18 0809 02/10/18 1135  GLUCAP 157* 189* 181* 94 120*   Lipid Profile: No results for input(s): CHOL, HDL, LDLCALC, TRIG, CHOLHDL, LDLDIRECT in the last 72  hours. Thyroid Function Tests: No results for input(s): TSH, T4TOTAL, FREET4, T3FREE, THYROIDAB in the last 72 hours. Anemia Panel: No results for input(s): VITAMINB12, FOLATE, FERRITIN, TIBC, IRON, RETICCTPCT in the last 72 hours. Urine analysis:    Component Value Date/Time   COLORURINE YELLOW (A) 01/21/2018 1121   APPEARANCEUR CLEAR (A) 01/21/2018 1121   APPEARANCEUR Clear 08/04/2013 1256   LABSPEC 1.009 01/21/2018 1121   LABSPEC 1.012 08/04/2013 1256   PHURINE 5.0 01/21/2018 1121   GLUCOSEU NEGATIVE 01/21/2018 1121   GLUCOSEU 50 mg/dL 08/04/2013 1256   HGBUR NEGATIVE 01/21/2018 [place] 01/21/2018 1121   BILIRUBINUR Negative 08/04/2013 1256   KETONESUR NEGATIVE 01/21/2018 1121   PROTEINUR NEGATIVE 01/21/2018 1121   NITRITE NEGATIVE 01/21/2018 1121   LEUKOCYTESUR NEGATIVE 01/21/2018 1121   LEUKOCYTESUR Negative 08/04/2013 1256   No results found for this or any previous visit (from the past 240 hour(s)).    Radiology Studies: No results found.  Scheduled Meds: . sodium chloride   Intravenous Once  . sodium chloride   Intravenous Once  . acetaminophen  1,000 mg Oral Q8H  . carvedilol  12.5 mg Oral BID WC  . cholecalciferol  2,000 Units Oral  Daily  . donepezil  10 mg Oral QHS  . ezetimibe  10 mg Oral QHS  . feeding supplement (GLUCERNA SHAKE)  237 mL Oral TID BM  . insulin aspart  0-5 Units Subcutaneous QHS  . insulin aspart  0-9 Units Subcutaneous TID WC  . insulin glargine  5 Units Subcutaneous QHS  . methocarbamol  500 mg Oral Q8H  . multivitamin with minerals  1 tablet Oral Daily  . pantoprazole  40 mg Oral BID  . polyethylene glycol  17 g Oral Daily  . rosuvastatin  10 mg Oral Daily  . tamsulosin  0.4 mg Oral QHS  . warfarin  7.5 mg Oral ONCE-1800  . Warfarin - Pharmacist Dosing Inpatient   Does not apply q1800   Continuous Infusions: . dextrose 5 % and 0.9 % NaCl with KCl 20 mEq/L 10 mL/hr at 02/02/18 1318  . heparin 1,000 Units/hr  (02/09/18 1200)  . lactated ringers Stopped (01/30/18 1336)     LOS: 16 days   Time spent: 35 minutes.  Patrecia Pour, MD [facility] [email or web address] Password [facility] 02/10/2018, 12:57 PM

## 2018-02-10 NOTE — Progress Notes (Signed)
ANTICOAGULATION CONSULT NOTE  Pharmacy Consult:  Heparin / Coumadin Indication: atrial fibrillation  Allergies  Allergen Reactions  . Pseudoephedrine Hcl Other (See Comments)    Other Reaction: ANURIA  . Ace Inhibitors Cough and Other (See Comments)    cough cough   . Nsaids Other (See Comments)    Reaction:  Fluid retention   . Vasotec [Enalapril] Cough    Patient Measurements: Height: 6' (182.9 cm) Weight: 221 lb 1.6 oz (100.3 kg) IBW/kg (Calculated) : 77.6 Heparin Dosing Weight: 98 kg  Vital Signs: Temp: 97.6 F (36.4 C) (09/14 0500) Temp Source: Tympanic (09/14 0500) BP: 117/54 (09/14 0500) Pulse Rate: 69 (09/14 0500)  Labs: Recent Labs    02/08/18 0455 02/08/18 1912 02/09/18 0500 02/10/18 0554  HGB 7.3*  --   --  8.4*  HCT 23.9*  --   --  27.1*  PLT 131*  --   --  139*  LABPROT 16.6*  --  16.1* 16.7*  INR 1.35  --  1.30 1.37  HEPARINUNFRC  --  0.20* 0.38 0.36  CREATININE  --   --  1.37* 1.19    Estimated Creatinine Clearance: 57.7 mL/min (by C-G formula based on SCr of 1.19 mg/dL).   Assessment: 44 YOM on Coumadin PTA for atrial fibrillation.  Med held on admit for right hemicolectomy due to colon tumor on 01/30/18. Pharmacy consulted to manage heparin drip post-op and was to resume Coumadin on 9/8; however, pt with bloody stool and serosangous drainage from JP drain 9/8 so heparin and coumadin held. No further blood reported and Hgb improved to 8.4 today. Plt rising at 139. Cardiology ok'd restarting anticoagulation on 9/12.   Heparin level this AM is therapeutic (0.38) for tight goal of 0.3 to 0.5 on 1000 units/hr.  INR up to 1.37 today (slow to increase).  No bleeding noted by RN. Home coumadin dose: 5mg  daily except for 2.5mg  on Tues/Thu/Sat    Goal of Therapy:  Heparin level 0.3-0.5 units/ml with recent GI bleed - no boluses INR 2 - 3 Monitor platelets by anticoagulation protocol: Yes    Plan:  Continue heparin gtt at 1000 units/hr  Coumadin  7.5mg  tonight at 1800 PM.  Daily PT / INR, heparin level and CBC  Sloan Leiter, PharmD, BCPS, BCCCP Clinical Pharmacist Clinical phone 02/10/2018 until 3:30PM - #[phone] Please refer to [facility] for [place] numbers 02/10/2018, 7:02 AM

## 2018-02-11 LAB — PROTIME-INR
INR: 1.78
Prothrombin Time: 20.6 seconds — ABNORMAL HIGH (ref 11.4–15.2)

## 2018-02-11 LAB — CBC
HCT: 27.4 % — ABNORMAL LOW (ref 39.0–52.0)
Hemoglobin: 8.5 g/dL — ABNORMAL LOW (ref 13.0–17.0)
MCH: 29 pg (ref 26.0–34.0)
MCHC: 31 g/dL (ref 30.0–36.0)
MCV: 93.5 fL (ref 78.0–100.0)
PLATELETS: 141 10*3/uL — AB (ref 150–400)
RBC: 2.93 MIL/uL — AB (ref 4.22–5.81)
RDW: 19.5 % — ABNORMAL HIGH (ref 11.5–15.5)
WBC: 5.8 10*3/uL (ref 4.0–10.5)

## 2018-02-11 LAB — GLUCOSE, CAPILLARY
GLUCOSE-CAPILLARY: 101 mg/dL — AB (ref 70–99)
GLUCOSE-CAPILLARY: 167 mg/dL — AB (ref 70–99)
Glucose-Capillary: 143 mg/dL — ABNORMAL HIGH (ref 70–99)
Glucose-Capillary: 156 mg/dL — ABNORMAL HIGH (ref 70–99)

## 2018-02-11 LAB — BASIC METABOLIC PANEL
ANION GAP: 7 (ref 5–15)
BUN: 11 mg/dL (ref 8–23)
CO2: 24 mmol/L (ref 22–32)
Calcium: 8.9 mg/dL (ref 8.9–10.3)
Chloride: 112 mmol/L — ABNORMAL HIGH (ref 98–111)
Creatinine, Ser: 1.31 mg/dL — ABNORMAL HIGH (ref 0.61–1.24)
GFR calc Af Amer: 56 mL/min — ABNORMAL LOW (ref >60)
GFR, EST NON AFRICAN AMERICAN: 49 mL/min — AB (ref >60)
Glucose, Bld: 108 mg/dL — ABNORMAL HIGH (ref 70–99)
POTASSIUM: 3.7 mmol/L (ref 3.5–5.1)
SODIUM: 143 mmol/L (ref 135–145)

## 2018-02-11 LAB — HEPARIN LEVEL (UNFRACTIONATED): HEPARIN UNFRACTIONATED: 0.38 [IU]/mL (ref 0.30–0.70)

## 2018-02-11 MED ORDER — WARFARIN SODIUM 2.5 MG PO TABS
2.5000 mg | ORAL_TABLET | Freq: Once | ORAL | Status: AC
  Administered 2018-02-11: 2.5 mg via ORAL
  Filled 2018-02-11: qty 1

## 2018-02-11 NOTE — Progress Notes (Signed)
Telemetry called, VTAC noted on monitor. Checked pt in room. Pt quietly sleeping, not in distress.

## 2018-02-11 NOTE — Progress Notes (Signed)
ANTICOAGULATION CONSULT NOTE  Pharmacy Consult:  Heparin / Coumadin Indication: atrial fibrillation  Allergies  Allergen Reactions  . Pseudoephedrine Hcl Other (See Comments)    Other Reaction: ANURIA  . Ace Inhibitors Cough and Other (See Comments)    cough cough   . Nsaids Other (See Comments)    Reaction:  Fluid retention   . Vasotec [Enalapril] Cough    Patient Measurements: Height: 6' (182.9 cm) Weight: 220 lb 14.4 oz (100.2 kg) IBW/kg (Calculated) : 77.6 Heparin Dosing Weight: 98 kg  Vital Signs: Temp: 98.2 F (36.8 C) (09/15 0559) Temp Source: Oral (09/15 0559) BP: 115/52 (09/15 0559) Pulse Rate: 80 (09/15 0559)  Labs: Recent Labs    02/09/18 0500 02/10/18 0554 02/11/18 0520  HGB  --  8.4* 8.5*  HCT  --  27.1* 27.4*  PLT  --  139* 141*  LABPROT 16.1* 16.7* 20.6*  INR 1.30 1.37 1.78  HEPARINUNFRC 0.38 0.36 0.38  CREATININE 1.37* 1.19 1.31*    Estimated Creatinine Clearance: 52.3 mL/min (A) (by C-G formula based on SCr of 1.31 mg/dL (H)).   Assessment: 57 YOM on Coumadin PTA for atrial fibrillation.  Med held on admit for right hemicolectomy due to colon tumor on 01/30/18. Pharmacy consulted to manage heparin drip post-op and was to resume Coumadin on 9/8; however, pt with bloody stool and serosangous drainage from JP drain 9/8 so heparin and coumadin held. No further blood reported and Hgb improved to 8.5 today. Plt rising at 141. Cardiology ok'd restarting anticoagulation on 9/12.   Heparin level this AM is therapeutic (0.38) for tight goal of 0.3 to 0.5 on 1000 units/hr.  INR up to 1.78 today (increased by 0.4 in  24 hours).  No bleeding noted by RN. Home coumadin dose: 5mg  daily except for 2.5mg  on Tues/Thu/Sat    Goal of Therapy:  Heparin level 0.3-0.5 units/ml with recent GI bleed - no boluses INR 2 - 3 Monitor platelets by anticoagulation protocol: Yes    Plan:  Continue heparin gtt at 1000 units/hr  Coumadin 2.5mg  tonight at 1800 PM - same  as home dose.  Daily PT / INR, heparin level and CBC If to discharge, recommend discharge on home regimen of 5mg  daily except 2.5mg  on Tues/Thurs/Sat.   Sloan Leiter, PharmD, BCPS, BCCCP Clinical Pharmacist Clinical phone 02/11/2018 until 3:30PM [phone] Please refer to [facility] for [place] numbers 02/11/2018, 12:39 PM

## 2018-02-11 NOTE — Progress Notes (Signed)
Pt able to place himself on CPAP when ready.

## 2018-02-11 NOTE — Care Management Note (Signed)
Case Management Note  Patient Details  Name: Luis Palmer MRN: [id number] Date of Birth: 1932-07-09  Subjective/Objective:                    Action/Plan: Pt with orders for [facility] services. CM provided the patient and his wife choice and they selected [facility]. Jermaine with [facility] notified and accepted the referral.  Pt with orders for a walker. Jermaine aware and will have DME delivered to the room. Wife to provide transport home when patient is medically ready for d/c.   Expected Discharge Date:  01/29/18               Expected Discharge Plan:  [place]  In-House Referral:     Discharge planning Services  CM Consult  Post Acute Care Choice:  Durable Medical Equipment, Home Health Choice offered to:  Patient  DME Arranged:  [facility]ene Rotunda DME Agency:  [place] Arranged:  PT [facility] Agency:  [place]  Status of Service:  Completed, signed off  If discussed at [place] of Stay Meetings, dates discussed:    Additional Comments:  Pollie Friar, RN 02/11/2018, 12:18 PM

## 2018-02-11 NOTE — Progress Notes (Signed)
Patient ID: Luis Palmer, male   DOB: May 13, 1933, 84 y.o.   MRN: [id number]   From a surgical standpoint, he is doing very well.  He's tolerating a diet and having BM's.  Just waiting to become therapeutic on coumadin. We will see him again as an outpatient.

## 2018-02-11 NOTE — Progress Notes (Signed)
PROGRESS NOTE  Luis Palmer VQM:[id number] DOB: 1932-10-16 DOA: 01/25/2018 PCP: Idelle Crouch, MD  HPI/Recap of past 24 hours: Luis Palmer is an 84 year old male with a history of ischemic cardiomyopathy, status post ICD, chronic atrial fibrillation status post ablation x2 on anticoagulation, CAD with history of CABG, chronic systolic heart failure who was initially admitted to [facility] 8/25 for work up of BRBPR, given Kcentra to reverse anticoagulation and transfused PRBC.He underwent colonoscopy showing colon mass in the ascending colon that was biopsied showing adenocarcinoma. He requested transfer to [facility] where he underwent laparoscopic assisted extended right hemicolectomy on 01/30/2018. Postoperative course has been complicated by anemia with anticoagulation being held. IV iron has been given and heparin bridging back to coumadin has been started. Continues to have drain in place. Due to significant anemia and significant bleeding risk, he will remain inpatient while bridging.   02/11/2018: Patient seen and examined at his bedside.  Reports mild abdominal discomfort however is able to pass gas..  INR still subtherapeutic.  Pharmacy managing.   Assessment/Plan: Active Problems:   Iron deficiency anemia   GI bleed   Acute posthemorrhagic anemia   Neoplasm of digestive system   Adenocarcinoma of colon (HCC)   Ischemic cardiomyopathy   Acute on chronic systolic CHF (congestive heart failure) (HCC)   Biventricular implantable cardioverter-defibrillator in situ   Atrial fibrillation, chronic (HCC)   Gastrointestinal bleeding, lower  Transverse colon adenocarcinoma: s/p right hemicolectomy 01/30/2018 with negative margins and negative nodes.  - Postop orders per general surgery, including drain management. - Follow up with oncology as an outpatient  Acute blood loss anemia on iron deficiency anemia: Due to GI bleeding from malignancy and postoperatively.  - Giving IV iron - s/p  1u PRBCs 9/13.  - Monitor CBC daily and for S/Sxs of bleeding while restarting anticoagulation.   Chronic atrial fibrillation:  - Resumed coumadin and heparin 9/12 for CHA2DS2-VASc score of 5. Per cardiology, would like therapeutic INR prior to discharge.  - Cardiology following, on coreg with controlled rate - Still subtherapeutic with INR 1.78 - Pharmacy managing - Obtain INR in the morning  Acute on chronic HFrEF, ICM, CAD s/p CABG: No chest pain. EF 20%, currently euvolemic. - Continue beta blocker, lasix and statin. ARB limited by hypotension.   T2DM:  - Continue SSI  Hypokalemia:  - Replace again today - Monitor  Mild dementia without behavioral disturbance:  - Continue aricept - Delirium precautions  Hyperlipidemia:  - Crestor, zetia  CKD 3 Appears at baseline Creatinine 1.31 with GFR 49 Avoid nephrotoxic agents/hypotension/dehydration Obtain BMP in the morning   Code Status: Full code  Family Communication: Family at bedside  Disposition Plan: Home possibly tomorrow 02/12/2018 when INR is therapeutic   Consultants:  Cardiology  Surgery  Procedures: 01/30/18 LAPAROSCOPIC ASSISTED EXTENDED RIGHT COLECTOMY    Antimicrobials:  Perioperative cefotetan 9/3  DVT prophylaxis: Heparin drip, Coumadin   Objective: Vitals:   02/10/18 0500 02/10/18 1528 02/10/18 2115 02/11/18 0559  BP: (!) 117/54 (!) 113/48 (!) 110/45 (!) 115/52  Pulse: 69 67 63 80  Resp: 18 16 16 18   Temp: 97.6 F (36.4 C) 98.2 F (36.8 C) 98.1 F (36.7 C) 98.2 F (36.8 C)  TempSrc: Tympanic Oral Oral Oral  SpO2: 99% 98% 97% 99%  Weight:    100.2 kg  Height:       No intake or output data in the 24 hours ending 02/11/18 1350 Filed Weights   02/08/18 0500 02/09/18 0500  02/11/18 0559  Weight: 100.4 kg 100.3 kg 100.2 kg    Exam:  . General: 84 y.o. year-old male well developed well nourished in no acute distress.  Alert and oriented x3. . Cardiovascular: Regular rate and  rhythm with no rubs or gallops.  No thyromegaly or JVD noted.   Marland Kitchen Respiratory: Clear to auscultation with no wheezes or rales. Good inspiratory effort. . Abdomen: Soft nontender nondistended with normal bowel sounds x4 quadrants. . Musculoskeletal: No lower extremity edema. 2/4 pulses in all 4 extremities. Marland Kitchen Psychiatry: Mood is appropriate for condition and setting   Data Reviewed: CBC: Recent Labs  Lab 02/06/18 0617 02/07/18 0548 02/08/18 0455 02/10/18 0554 02/11/18 0520  WBC 8.7 7.1 5.2 4.6 5.8  HGB 7.6* 7.2* 7.3* 8.4* 8.5*  HCT 24.6* 23.6* 23.9* 27.1* 27.4*  MCV 89.5 89.7 90.9 91.9 93.5  PLT 147* 133* 131* 139* 983*   Basic Metabolic Panel: Recent Labs  Lab 02/06/18 0617 02/07/18 0548 02/09/18 0500 02/10/18 0554 02/11/18 0520  NA 139 140 140 140 143  K 3.2* 4.3 3.5 3.3* 3.7  CL 104 103 109 109 112*  CO2 26 24 24 24 24   GLUCOSE 127* 119* 105* 101* 108*  BUN 13 14 12 11 11   CREATININE 1.48* 1.50* 1.37* 1.19 1.31*  CALCIUM 8.8* 9.2 8.7* 8.8* 8.9  MG 1.8  --   --   --   --    GFR: Estimated Creatinine Clearance: 52.3 mL/min (A) (by C-G formula based on SCr of 1.31 mg/dL (H)). Liver Function Tests: No results for input(s): AST, ALT, ALKPHOS, BILITOT, PROT, ALBUMIN in the last 168 hours. No results for input(s): LIPASE, AMYLASE in the last 168 hours. No results for input(s): AMMONIA in the last 168 hours. Coagulation Profile: Recent Labs  Lab 02/07/18 0548 02/08/18 0455 02/09/18 0500 02/10/18 0554 02/11/18 0520  INR 1.42 1.35 1.30 1.37 1.78   Cardiac Enzymes: No results for input(s): CKTOTAL, CKMB, CKMBINDEX, TROPONINI in the last 168 hours. BNP (last 3 results) No results for input(s): PROBNP in the last 8760 hours. HbA1C: No results for input(s): HGBA1C in the last 72 hours. CBG: Recent Labs  Lab 02/10/18 1135 02/10/18 1712 02/10/18 2057 02/11/18 0810 02/11/18 1224  GLUCAP 120* 185* 139* 101* 143*   Lipid Profile: No results for input(s): CHOL,  HDL, LDLCALC, TRIG, CHOLHDL, LDLDIRECT in the last 72 hours. Thyroid Function Tests: No results for input(s): TSH, T4TOTAL, FREET4, T3FREE, THYROIDAB in the last 72 hours. Anemia Panel: No results for input(s): VITAMINB12, FOLATE, FERRITIN, TIBC, IRON, RETICCTPCT in the last 72 hours. Urine analysis:    Component Value Date/Time   COLORURINE YELLOW (A) 01/21/2018 1121   APPEARANCEUR CLEAR (A) 01/21/2018 1121   APPEARANCEUR Clear 08/04/2013 1256   LABSPEC 1.009 01/21/2018 1121   LABSPEC 1.012 08/04/2013 1256   PHURINE 5.0 01/21/2018 1121   GLUCOSEU NEGATIVE 01/21/2018 1121   GLUCOSEU 50 mg/dL 08/04/2013 1256   HGBUR NEGATIVE 01/21/2018 1121   BILIRUBINUR NEGATIVE 01/21/2018 1121   BILIRUBINUR Negative 08/04/2013 1256   KETONESUR NEGATIVE 01/21/2018 1121   PROTEINUR NEGATIVE 01/21/2018 1121   NITRITE NEGATIVE 01/21/2018 1121   LEUKOCYTESUR NEGATIVE 01/21/2018 1121   LEUKOCYTESUR Negative 08/04/2013 1256   Sepsis Labs: @LABRCNTIP (procalcitonin:4,lacticidven:4)  )No results found for this or any previous visit (from the past 240 hour(s)).    Studies: No results found.  Scheduled Meds: . sodium chloride   Intravenous Once  . sodium chloride   Intravenous Once  . acetaminophen  1,000 mg  Oral Q8H  . carvedilol  12.5 mg Oral BID WC  . cholecalciferol  2,000 Units Oral Daily  . donepezil  10 mg Oral QHS  . ezetimibe  10 mg Oral QHS  . feeding supplement (GLUCERNA SHAKE)  237 mL Oral TID BM  . insulin aspart  0-5 Units Subcutaneous QHS  . insulin aspart  0-9 Units Subcutaneous TID WC  . insulin glargine  5 Units Subcutaneous QHS  . methocarbamol  500 mg Oral Q8H  . multivitamin with minerals  1 tablet Oral Daily  . pantoprazole  40 mg Oral BID  . polyethylene glycol  17 g Oral Daily  . rosuvastatin  10 mg Oral Daily  . tamsulosin  0.4 mg Oral QHS  . warfarin  2.5 mg Oral ONCE-1800  . Warfarin - Pharmacist Dosing Inpatient   Does not apply q1800    Continuous  Infusions: . dextrose 5 % and 0.9 % NaCl with KCl 20 mEq/L 10 mL/hr at 02/02/18 1318  . heparin 1,000 Units/hr (02/09/18 1200)  . lactated ringers Stopped (01/30/18 1336)     LOS: 17 days     Kayleen Memos, MD [facility] Pager [phone]  If 7PM-7AM, please contact night-coverage [email or web address] Password [email or web address] 02/11/2018, 1:50 PM

## 2018-02-12 LAB — PROTIME-INR
INR: 2.29
Prothrombin Time: 25 seconds — ABNORMAL HIGH (ref 11.4–15.2)

## 2018-02-12 LAB — BASIC METABOLIC PANEL
Anion gap: 9 (ref 5–15)
BUN: 11 mg/dL (ref 8–23)
CO2: 25 mmol/L (ref 22–32)
CREATININE: 1.32 mg/dL — AB (ref 0.61–1.24)
Calcium: 9.1 mg/dL (ref 8.9–10.3)
Chloride: 108 mmol/L (ref 98–111)
GFR calc non Af Amer: 48 mL/min — ABNORMAL LOW (ref >60)
GFR, EST AFRICAN AMERICAN: 56 mL/min — AB (ref >60)
Glucose, Bld: 100 mg/dL — ABNORMAL HIGH (ref 70–99)
Potassium: 4.4 mmol/L (ref 3.5–5.1)
Sodium: 142 mmol/L (ref 135–145)

## 2018-02-12 LAB — GLUCOSE, CAPILLARY
GLUCOSE-CAPILLARY: 94 mg/dL (ref 70–99)
Glucose-Capillary: 132 mg/dL — ABNORMAL HIGH (ref 70–99)

## 2018-02-12 LAB — CBC
HCT: 28.6 % — ABNORMAL LOW (ref 39.0–52.0)
Hemoglobin: 8.6 g/dL — ABNORMAL LOW (ref 13.0–17.0)
MCH: 28.6 pg (ref 26.0–34.0)
MCHC: 30.1 g/dL (ref 30.0–36.0)
MCV: 95 fL (ref 78.0–100.0)
Platelets: 146 10*3/uL — ABNORMAL LOW (ref 150–400)
RBC: 3.01 MIL/uL — ABNORMAL LOW (ref 4.22–5.81)
RDW: 20.4 % — ABNORMAL HIGH (ref 11.5–15.5)
WBC: 5.2 10*3/uL (ref 4.0–10.5)

## 2018-02-12 LAB — HEPARIN LEVEL (UNFRACTIONATED): HEPARIN UNFRACTIONATED: 0.35 [IU]/mL (ref 0.30–0.70)

## 2018-02-12 MED ORDER — ADULT MULTIVITAMIN W/MINERALS CH: 1.0000 | ORAL_TABLET | Freq: Every day | ORAL | 0 refills | Status: AC

## 2018-02-12 MED ORDER — METHOCARBAMOL 500 MG PO TABS: 500.0000 mg | ORAL_TABLET | Freq: Three times a day (TID) | ORAL | 0 refills | Status: DC | PRN

## 2018-02-12 MED ORDER — WARFARIN SODIUM 5 MG PO TABS: 5.0000 mg | ORAL_TABLET | Freq: Once | ORAL | 0 refills | Status: DC

## 2018-02-12 MED ORDER — WARFARIN SODIUM 5 MG PO TABS: 5.0000 mg | ORAL_TABLET | Freq: Once | ORAL | Status: DC

## 2018-02-12 MED ORDER — TAMSULOSIN HCL 0.4 MG PO CAPS: 0.4000 mg | ORAL_CAPSULE | Freq: Every day | ORAL | 0 refills | Status: AC

## 2018-02-12 MED ORDER — ACETAMINOPHEN 500 MG PO TABS: 1000.0000 mg | ORAL_TABLET | Freq: Three times a day (TID) | ORAL | 0 refills | Status: AC

## 2018-02-12 MED ORDER — POLYETHYLENE GLYCOL 3350 17 G PO PACK: 17.0000 g | PACK | Freq: Every day | ORAL | 0 refills | Status: DC

## 2018-02-12 NOTE — Progress Notes (Signed)
ANTICOAGULATION CONSULT NOTE  Pharmacy Consult:  Heparin / Coumadin Indication: atrial fibrillation  Allergies  Allergen Reactions  . Pseudoephedrine Hcl Other (See Comments)    Other Reaction: ANURIA  . Ace Inhibitors Cough and Other (See Comments)    cough cough   . Nsaids Other (See Comments)    Reaction:  Fluid retention   . Vasotec [Enalapril] Cough    Patient Measurements: Height: 6' (182.9 cm) Weight: 220 lb 14.4 oz (100.2 kg) IBW/kg (Calculated) : 77.6 Heparin Dosing Weight: 98 kg  Vital Signs: Temp: 98.3 F (36.8 C) (09/16 0500) Temp Source: Oral (09/16 0500) BP: 111/50 (09/16 0500) Pulse Rate: 67 (09/16 0500)  Labs: Recent Labs    02/10/18 0554 02/11/18 0520 02/12/18 0632  HGB 8.4* 8.5* 8.6*  HCT 27.1* 27.4* 28.6*  PLT 139* 141* 146*  LABPROT 16.7* 20.6* 25.0*  INR 1.37 1.78 2.29  HEPARINUNFRC 0.36 0.38 0.35  CREATININE 1.19 1.31* 1.32*    Estimated Creatinine Clearance: 51.9 mL/min (A) (by C-G formula based on SCr of 1.32 mg/dL (H)).   Assessment: 65 YOM on Coumadin PTA for atrial fibrillation.  Med held on admit for right hemicolectomy due to colon tumor on 01/30/18. Pharmacy consulted to manage heparin drip post-op and was to resume Coumadin on 9/8; however, pt with bloody stool and serosangous drainage from JP drain 9/8 so heparin and coumadin held. No further blood reported and Hgb improved to 8.5 today. Plt rising at 141. Cardiology ok'd restarting anticoagulation on 9/12.   Heparin level therapeutic at 0.35 on 1000 units/hr INR now therapeutic at 2.29 CBC remains low but stable, and no bleeding reported  Home coumadin dose: 5mg  daily except for 2.5mg  on Tues/Thu/Sat   Goal of Therapy:  Heparin level 0.3-0.5 units/ml with recent GI bleed - no boluses INR 2 - 3 Monitor platelets by anticoagulation protocol: Yes    Plan:  Warfarin 5mg  PO x 1 Discontinue heparin gtt at this time Recommend discharge on home regimen of 5mg  daily except 2.5mg   on Tues/Thurs/Sat.   Bertis Ruddy, PharmD Clinical Pharmacist Please check AMION for all [place] numbers 02/12/2018 9:01 AM

## 2018-02-12 NOTE — Progress Notes (Signed)
Progress Note  Patient Name: Luis Palmer Date of Encounter: 02/12/2018  Primary Cardiologist: No primary care provider on file. Luis Palmer [facility]  Subjective   Weight stable 100 kg (essentially unchanged last 10 days). INR 2.2 K 4.4, Creat 1.3, Hgb 8.6 - all stable.  Inpatient Medications    Scheduled Meds: . sodium chloride   Intravenous Once  . sodium chloride   Intravenous Once  . acetaminophen  1,000 mg Oral Q8H  . carvedilol  12.5 mg Oral BID WC  . cholecalciferol  2,000 Units Oral Daily  . donepezil  10 mg Oral QHS  . ezetimibe  10 mg Oral QHS  . feeding supplement (GLUCERNA SHAKE)  237 mL Oral TID BM  . insulin aspart  0-5 Units Subcutaneous QHS  . insulin aspart  0-9 Units Subcutaneous TID WC  . insulin glargine  5 Units Subcutaneous QHS  . methocarbamol  500 mg Oral Q8H  . multivitamin with minerals  1 tablet Oral Daily  . pantoprazole  40 mg Oral BID  . polyethylene glycol  17 g Oral Daily  . rosuvastatin  10 mg Oral Daily  . tamsulosin  0.4 mg Oral QHS  . warfarin  5 mg Oral ONCE-1800  . Warfarin - Pharmacist Dosing Inpatient   Does not apply q1800   Continuous Infusions: . dextrose 5 % and 0.9 % NaCl with KCl 20 mEq/L 10 mL/hr at 02/02/18 1318  . heparin 1,000 Units/hr (02/11/18 1551)  . lactated ringers Stopped (01/30/18 1336)   PRN Meds: ondansetron **OR** ondansetron (ZOFRAN) IV, oxyCODONE   Vital Signs    Vitals:   02/11/18 0559 02/11/18 1418 02/11/18 2158 02/12/18 0500  BP: (!) 115/52 (!) 110/46 (!) 117/49 (!) 111/50  Pulse: 80 97 64 67  Resp: 18 17 16    Temp: 98.2 F (36.8 C) 98 F (36.7 C) 97.8 F (36.6 C) 98.3 F (36.8 C)  TempSrc: Oral Oral Oral Oral  SpO2: 99% 98% 97% 98%  Weight: 100.2 kg     Height:        Intake/Output Summary (Last 24 hours) at 02/12/2018 1014 Last data filed at 02/11/2018 1419 Gross per 24 hour  Intake 120 ml  Output -  Net 120 ml   Filed Weights   02/08/18 0500 02/09/18 0500 02/11/18 0559    Weight: 100.4 kg 100.3 kg 100.2 kg    Telemetry    100% V paced at 70 - Personally Reviewed  ECG    No new tracing - Personally Reviewed  Physical Exam  Mildly pale, looks comfortable lying down GEN: No acute distress.   Neck: No JVD Cardiac: RRR, split S2, no murmurs, rubs, or gallops. No edema. Healthy L CRT-D site Respiratory: Clear to auscultation bilaterally. GI: Soft, nontender, non-distended  MS: No edema; No deformity. Neuro:  Nonfocal  Psych: Normal affect   Labs    Chemistry Recent Labs  Lab 02/10/18 0554 02/11/18 0520 02/12/18 0632  NA 140 143 142  K 3.3* 3.7 4.4  CL 109 112* 108  CO2 24 24 25   GLUCOSE 101* 108* 100*  BUN 11 11 11   CREATININE 1.19 1.31* 1.32*  CALCIUM 8.8* 8.9 9.1  GFRNONAA 55* 49* 48*  GFRAA >60 56* 56*  ANIONGAP 7 7 9      Hematology Recent Labs  Lab 02/10/18 0554 02/11/18 0520 02/12/18 0632  WBC 4.6 5.8 5.2  RBC 2.95* 2.93* 3.01*  HGB 8.4* 8.5* 8.6*  HCT 27.1* 27.4* 28.6*  MCV 91.9 93.5  95.0  MCH 28.5 29.0 28.6  MCHC 31.0 31.0 30.1  RDW 18.2* 19.5* 20.4*  PLT 139* 141* 146*    Cardiac EnzymesNo results for input(s): TROPONINI in the last 168 hours. No results for input(s): TROPIPOC in the last 168 hours.   BNPNo results for input(s): BNP, PROBNP in the last 168 hours.   DDimer No results for input(s): DDIMER in the last 168 hours.   Radiology    No results found.  Cardiac Studies   01/24/2018 TTE - Left ventricle: The cavity size was severely dilated. Systolic function was severely reduced. The estimated ejection fraction was 20%. Diffuse hypokinesis. - Aortic valve: Valve area (Vmax): 2.16 cm^2. - Mitral valve: There was mild to moderate regurgitation. - Left atrium: The atrium was mildly dilated. - Right ventricle: The cavity size was mildly dilated. - Right atrium: The atrium was mildly dilated. - Tricuspid valve: There was mild-moderate regurgitation.  Patient Profile     84 y.o. male with a  history of known chronic systolic congestive heart failure, ICM s/p ICD, Chronic Afib s/p ablation x2, MDT VIVA XT CRT-D,  coronary artery disease-status post coronary artery bypass grafting, OSA on CPAP.  Admitted 08/29 with GI bleed. R hemicolectomy 01/30/18 for adenocarcinoma.   Assessment & Plan    Chronic Atrial Fibrillation - Carvedilol for rate control. CRT-D w 100 % BiV pacing. - INR therapeutic, stop heparin.  Chronic Systolic Congestive Heart Failure, ICM, and CAD s/p CABG - Most recent echo as above in cardiac studies with EF 20%. - weight stable 100 kg -Admission weight was 106.5 kg, peak 108.1, lowest 99.1 kg.  No weight today, but yesterday was 100.3.   - Holding furosemide and losartan. Bp have been stable though on the low end. Weights as above.  - Continue Carvedilol, Zetia, Statin therapy. Restart losartan (BP is better) and diuretic (likely higher Na intake at home) at DC. - Monitor daily weights, promptly report weight gain or loss in excess of 3 lb from DC weight.  CKD III - Creatinine probably at baseline. K normal.  HTN - postoperative hypotension has improved  DM2 - Per IM  Adenocarcinoma s/p resection of tumor and associated acute blood loss anemia - no evidence of renewed bleeding since last transfusion.  [facility] will sign off.   Medication Recommendations:  Resume all his home preop meds (including the furosemide and losartan) at DC Other recommendations (labs, testing, etc):  Recheck BMET, Hgb and INR no later than 1 week Follow up as an outpatient:  He has F/u w Dr. Stann Palmer at [facility] November 4  For questions or updates, please contact [facility] Please consult [email or web address] for contact info under        Signed, Luis Klein, MD  02/12/2018, 10:14 AM

## 2018-02-12 NOTE — Discharge Summary (Signed)
Discharge Summary  Luis Palmer IDP:[id number] DOB: Apr 19, 1933  PCP: Idelle Crouch, MD  Admit date: 01/25/2018 Discharge date: 02/12/2018  Time spent: 26 minutes  Recommendations for Outpatient Follow-up:  1. Outpatient cardiology with Dr. Stann Mainland at [facility] on November 4 2. Recheck BMP hemoglobin A1c and INR no later than 1 week 3. 3.  He will follow-up with oncology as outpatient  Discharge Diagnoses:  Active Hospital Problems   Diagnosis Date Noted  . Adenocarcinoma of colon ([place]) 01/25/2018  . Ischemic cardiomyopathy 01/25/2018  . Acute on chronic systolic CHF (congestive heart failure) ([place]) 01/25/2018  . Biventricular implantable cardioverter-defibrillator in situ 01/25/2018  . Atrial fibrillation, chronic ([place]) 01/25/2018  . Gastrointestinal bleeding, lower 01/25/2018  . Acute posthemorrhagic anemia   . Neoplasm of digestive system   . GI bleed 01/21/2018  . Iron deficiency anemia 10/02/2017    Resolved Hospital Problems   Diagnosis Date Noted Date Resolved  . ICD (implantable cardioverter-defibrillator) in place 01/25/2018 01/25/2018    Discharge Condition: Improved  Diet recommendation: Cardiac  Vitals:   02/12/18 0500 02/12/18 1552  BP: (!) 111/50 (!) 99/48  Pulse: 67 62  Resp:  18  Temp: 98.3 F (36.8 C) 97.9 F (36.6 C)  SpO2: 98% 97%    History of present illness:  84 year old male with history of chronic systolic congestive heart failure idiopathic cardiomyopathy S/P ICD chronic atrial fibrillation S/P ablation x2 coronary artery disease status post coronary artery bypass grafting and obstructive sleep apnea on CPAP.  He was admitted January 25, 2018 with GI bleed.  He underwent right hemicolectomy on January 30, 2018 for adenocarcinoma  Hospital Course:  Active Problems:   Iron deficiency anemia   GI bleed   Acute posthemorrhagic anemia   Neoplasm of digestive system   Adenocarcinoma of colon ([facility])   Ischemic  cardiomyopathy   Acute on chronic systolic CHF (congestive heart failure) (H[facility])   Biventricular implantable cardioverter-defibrillator in situ   Atrial fibrillation, chronic (H[facility])   Gastrointestinal bleeding, lower His chronic atrial fibrillation was managed with carvedilol.  He he was bridged with heparin drip, was continued on his Coumadin until he became therapeutic heparin was discontinued  Chronic kidney disease his furosemide was held as well as his losartan he was continued on carvedilol and statin  Hypertension he had postoperative hypotension which normalized: He had postoperative hypotension which has normalized  Procedures:  Laparoscopic assisted extended right colectomy on January 30, 2018  Iron infusion  Status post 1 unit blood transfusion in February 09, 2018  Consultations:  Cardiology  Surgery  Discharge Exam: BP (!) 99/48 (BP Location: Left Arm)   Pulse 62   Temp 97.9 F (36.6 C) (Oral)   Resp 18   Ht 6' (1.829 m)   Wt 100.2 kg   SpO2 97%   BMI 29.96 kg/m   General: Alert oriented x3 frail looking Cardiovascular: Heart rate regular rate and rhythm no murmur Respiratory: Respirations unlabored Lower extremity trace edema bilaterally  Discharge Instructions You were cared for by a hospitalist during your hospital stay. If you have any questions about your discharge medications or the care you received while you were in the hospital after you are discharged, you can call the unit and asked to speak with the hospitalist on call if the hospitalist that took care of you is not available. Once you are discharged, your primary care physician will handle any further medical issues. Please note that NO REFILLS for any discharge medications  will be authorized once you are discharged, as it is imperative that you return to your primary care physician (or establish a relationship with a primary care physician if you do not have one) for your aftercare needs so that  they can reassess your need for medications and monitor your lab values.  Discharge Instructions    Call MD for:  persistant nausea and vomiting   Complete by:  As directed    Call MD for:  redness, tenderness, or signs of infection (pain, swelling, redness, odor or green/yellow discharge around incision site)   Complete by:  As directed    Call MD for:  severe uncontrolled pain   Complete by:  As directed    Call MD for:  temperature >100.4   Complete by:  As directed    Diet - low sodium heart healthy   Complete by:  As directed    1800-calorie ADA diet no concentrated sweets   Discharge instructions   Complete by:  As directed    Follow-up with cardiology and primary care provider increase fluids.  Follow-up primary care provider need to recheck chemistry to check on his creatinine level and CBC in 1 week   Increase activity slowly   Complete by:  As directed      Allergies as of 02/12/2018      Reactions   Pseudoephedrine Hcl Other (See Comments)   Other Reaction: ANURIA   Ace Inhibitors Cough, Other (See Comments)   cough cough   Nsaids Other (See Comments)   Reaction:  Fluid retention    Vasotec [enalapril] Cough      Medication List    TAKE these medications   acetaminophen 500 MG tablet Commonly known as:  TYLENOL Take 2 tablets (1,000 mg total) by mouth every 8 (eight) hours.   ALCORTIN A 1-2-1 % Gel Apply 1 application topically as needed for dry skin.   betamethasone dipropionate 0.05 % lotion Apply 1 application topically daily as needed for itching or dry skin.   carvedilol 12.5 MG tablet Commonly known as:  COREG Take 12.5 mg by mouth 2 (two) times daily.   donepezil 10 MG tablet Commonly known as:  ARICEPT Take 10 mg by mouth at bedtime.   ezetimibe 10 MG tablet Commonly known as:  ZETIA Take 10 mg by mouth at bedtime.   furosemide 40 MG tablet Commonly known as:  LASIX Take 40 mg by mouth daily.   losartan 25 MG tablet Commonly known as:   COZAAR Take 25 mg by mouth daily.   meclizine 25 MG tablet Commonly known as:  ANTIVERT Take 25 mg by mouth daily.   methocarbamol 500 MG tablet Commonly known as:  ROBAXIN Take 1 tablet (500 mg total) by mouth every 8 (eight) hours as needed for muscle spasms.   multivitamin with minerals Tabs tablet Take 1 tablet by mouth daily. Start taking on:  02/13/2018   pantoprazole 40 MG tablet Commonly known as:  PROTONIX Take 1 tablet (40 mg total) by mouth 2 (two) times daily.   polyethylene glycol packet Commonly known as:  MIRALAX / GLYCOLAX Take 17 g by mouth daily. Start taking on:  02/13/2018   rosuvastatin 10 MG tablet Commonly known as:  CRESTOR Take 10 mg by mouth daily.   tamsulosin 0.4 MG Caps capsule Commonly known as:  FLOMAX Take 1 capsule (0.4 mg total) by mouth at bedtime.   Vitamin D 2000 units Caps Take 1 capsule by mouth daily.   warfarin  5 MG tablet Commonly known as:  COUMADIN Take 1 tablet (5 mg total) by mouth one time only at 6 PM.            Durable Medical Equipment  (From admission, onward)         Start     Ordered   02/10/18 1302  For home use only DME Walker rolling  Once    Question:  Patient needs a walker to treat with the following condition  Answer:  Gait instability   02/10/18 1301         Allergies  Allergen Reactions  . Pseudoephedrine Hcl Other (See Comments)    Other Reaction: ANURIA  . Ace Inhibitors Cough and Other (See Comments)    cough cough   . Nsaids Other (See Comments)    Reaction:  Fluid retention   . Vasotec [Enalapril] Cough   Follow-up Information    Jovita Kussmaul, MD. Go on 02/20/2018.   Specialty:  General Surgery Why:  Follow up appointment scheduled for 11:10 AM. Please arrive 30 min prior to appointment time. Bring photo ID and insurance information. and insurance information. Pt given an appt on 02/20/18 at Pt given an am with Dr Marlou Starks.  Contact information: [address] [place] [address] [phone]        [facility] - Dme Follow up.   Why:  They will contact you for the first appointment. Contact information: [address] [place]  [address] [phone]            The results of significant diagnostics from this hospitalization (including imaging, microbiology, ancillary and laboratory) are listed below for reference.    Significant Diagnostic Studies: Dg Chest 2 View  Result Date: 01/25/2018 CLINICAL DATA:  Congestive heart failure EXAM: CHEST - 2 VIEW COMPARISON:  CT 01/23/2018 FINDINGS: Cardiomegaly with moderate aortic atherosclerosis. Left-sided ICD device projects over the mid left hemithorax. Leads in the right atrium, right ventricle and coronary sinus are noted. Small bilateral pleural effusions blunting the costophrenic angles. No overt pulmonary edema. Mild interstitial change of the lungs. IMPRESSION: Cardiomegaly with aortic atherosclerosis. Small bilateral pleural effusions. No active pulmonary disease. Electronically Signed   By: Ashley Royalty M.D.   On: 01/25/2018 22:53   Ct Chest W Contrast  Result Date: 01/23/2018 CLINICAL DATA:  Hypotension and dizziness. Possible malignant tumor found in colon on colonoscopy earlier today. EXAM: CT CHEST, ABDOMEN, AND PELVIS WITH CONTRAST TECHNIQUE: Multidetector CT imaging of the chest, abdomen and pelvis was performed following the standard protocol during bolus administration of intravenous contrast. CONTRAST:  60mL OMNIPAQUE IOHEXOL 300 MG/ML  SOLN COMPARISON:  CXR 12/29/2014 FINDINGS: CT CHEST FINDINGS Cardiovascular: Conventional branch pattern of the great vessels with atherosclerosis. No significant stenosis. There is moderate atherosclerosis of the thoracic aorta without aneurysm. Left main and three-vessel coronary arteriosclerosis of the native coronary arteries identified with post CABG change. Left-sided ICD device with leads in the right atrium, right  ventricle and coronary sinus. No pericardial effusion. There is cardiomegaly. No large central pulmonary embolus. Mediastinum/Nodes: Small subcentimeter short axis lymph nodes within the mediastinum and both hila. Mild subcarinal lymphadenopathy up to 18 mm short axis. Trachea and mainstem bronchi are patent. Esophagus is unremarkable. Lungs/Pleura: Small right greater than left pleural effusions. Patchy ground-glass opacities are noted throughout both lungs with mild peribronchial thickening. Findings are nonspecific but atypical pneumonia, alveolitis or pneumonitis are among some possibilities or stigmata of CHF given  the small pleural effusions and cardiomegaly. No pneumothorax. No dominant mass is identified. Musculoskeletal: No aggressive osseous lesions or fracture. CT ABDOMEN PELVIS FINDINGS Hepatobiliary: Distended gallbladder with mural thickening and edema raising concern for cholecystitis. Sympathetic thickening from possible adjacent pancreatitis given peripancreatic edema is also a possibility. The liver is unremarkable and free of enhancing mass lesions. No biliary dilatation. Pancreas: Peripancreatic edema is noted without space-occupying mass of the pancreas or ductal dilatation. Pancreatitis is a possibility without complicating features. Spleen: Splenic granulomas.  No splenomegaly. Adrenals/Urinary Tract: Normal bilateral adrenal glands. Water attenuating cyst off the lower pole of the right kidney measuring 19 mm in diameter. No nephrolithiasis nor hydroureteronephrosis. The urinary bladder is unremarkable for the degree of distention. Stomach/Bowel: Physiologic distention of the stomach with normal small bowel rotation and ligament of Treitz position. No small bowel obstruction or inflammation. Distal and terminal ileum are normal. Segmental thickening of the ascending colon is identified either representing neoplastic enlargement and thickening or focal inflammatory thickening suggest colitis.  Clinical correlation with findings at colonoscopy recommended. Appendectomy. Vascular/Lymphatic: Moderate aortoiliac and branch vessel atherosclerosis without aneurysm. Suboptimal opacification of the splenic and portal veins. Small porta hepatic subcentimeter short axis lymph nodes. No adenopathy by CT size criteria. Reproductive: Enlarged prostate measuring 6.6 x 6 x 5.6 cm. Other: Small fat containing left inguinal hernia. Small periumbilical fat containing hernia. Musculoskeletal: No aggressive osseous lesions. Mild degenerative change along the thoracolumbar spine. IMPRESSION: Chest CT: 1. Patchy ground-glass opacities throughout the visualized lungs potentially representing stigmata of CHF given cardiomegaly and small bilateral pleural effusions. Alveolitis or pneumonitis and/or potentially hypoventilatory change versus atypical pneumonia are some other possible considerations. No dominant mass. 2. Status post CABG with ICD device in place. Coronary arteriosclerosis of the native coronary arteries. 3.  Aortic atherosclerosis (ICD10-I70.0) CT abdomen pelvis: 1. Peripancreatic edema raising the possibility of uncomplicated acute pancreatitis. Correlate clinically. 2. Somewhat distended and thick-walled appearance of the gallbladder which could be sympathetic due to adjacent pancreatitis versus representing changes of acute cholecystitis. 3. Normal size spleen with splenic granulomata. 4. Water attenuating cyst off the lower pole the right kidney measuring 19 mm. 5. Abnormal segmental thickening of the ascending colon, neoplasm versus marked inflammatory change/colitis. 6. Enlarged prostate. 7. Moderate aortoiliac and branch vessel atherosclerosis without aneurysm. No adenopathy. 8. Small fat containing left inguinal hernia and periumbilical fat containing hernia. Electronically Signed   By: Ashley Royalty M.D.   On: 01/23/2018 20:36   Ct Abdomen Pelvis W Contrast  Result Date: 01/23/2018 CLINICAL DATA:   Hypotension and dizziness. Possible malignant tumor found in colon on colonoscopy earlier today. EXAM: CT CHEST, ABDOMEN, AND PELVIS WITH CONTRAST TECHNIQUE: Multidetector CT imaging of the chest, abdomen and pelvis was performed following the standard protocol during bolus administration of intravenous contrast. CONTRAST:  56mL OMNIPAQUE IOHEXOL 300 MG/ML  SOLN COMPARISON:  CXR 12/29/2014 FINDINGS: CT CHEST FINDINGS Cardiovascular: Conventional branch pattern of the great vessels with atherosclerosis. No significant stenosis. There is moderate atherosclerosis of the thoracic aorta without aneurysm. Left main and three-vessel coronary arteriosclerosis of the native coronary arteries identified with post CABG change. Left-sided ICD device with leads in the right atrium, right ventricle and coronary sinus. No pericardial effusion. There is cardiomegaly. No large central pulmonary embolus. Mediastinum/Nodes: Small subcentimeter short axis lymph nodes within the mediastinum and both hila. Mild subcarinal lymphadenopathy up to 18 mm short axis. Trachea and mainstem bronchi are patent. Esophagus is unremarkable. Lungs/Pleura: Small right greater than left pleural effusions. Patchy  ground-glass opacities are noted throughout both lungs with mild peribronchial thickening. Findings are nonspecific but atypical pneumonia, alveolitis or pneumonitis are among some possibilities or stigmata of CHF given the small pleural effusions and cardiomegaly. No pneumothorax. No dominant mass is identified. Musculoskeletal: No aggressive osseous lesions or fracture. CT ABDOMEN PELVIS FINDINGS Hepatobiliary: Distended gallbladder with mural thickening and edema raising concern for cholecystitis. Sympathetic thickening from possible adjacent pancreatitis given peripancreatic edema is also a possibility. The liver is unremarkable and free of enhancing mass lesions. No biliary dilatation. Pancreas: Peripancreatic edema is noted without  space-occupying mass of the pancreas or ductal dilatation. Pancreatitis is a possibility without complicating features. Spleen: Splenic granulomas.  No splenomegaly. Adrenals/Urinary Tract: Normal bilateral adrenal glands. Water attenuating cyst off the lower pole of the right kidney measuring 19 mm in diameter. No nephrolithiasis nor hydroureteronephrosis. The urinary bladder is unremarkable for the degree of distention. Stomach/Bowel: Physiologic distention of the stomach with normal small bowel rotation and ligament of Treitz position. No small bowel obstruction or inflammation. Distal and terminal ileum are normal. Segmental thickening of the ascending colon is identified either representing neoplastic enlargement and thickening or focal inflammatory thickening suggest colitis. Clinical correlation with findings at colonoscopy recommended. Appendectomy. Vascular/Lymphatic: Moderate aortoiliac and branch vessel atherosclerosis without aneurysm. Suboptimal opacification of the splenic and portal veins. Small porta hepatic subcentimeter short axis lymph nodes. No adenopathy by CT size criteria. Reproductive: Enlarged prostate measuring 6.6 x 6 x 5.6 cm. Other: Small fat containing left inguinal hernia. Small periumbilical fat containing hernia. Musculoskeletal: No aggressive osseous lesions. Mild degenerative change along the thoracolumbar spine. IMPRESSION: Chest CT: 1. Patchy ground-glass opacities throughout the visualized lungs potentially representing stigmata of CHF given cardiomegaly and small bilateral pleural effusions. Alveolitis or pneumonitis and/or potentially hypoventilatory change versus atypical pneumonia are some other possible considerations. No dominant mass. 2. Status post CABG with ICD device in place. Coronary arteriosclerosis of the native coronary arteries. 3.  Aortic atherosclerosis (ICD10-I70.0) CT abdomen pelvis: 1. Peripancreatic edema raising the possibility of uncomplicated acute  pancreatitis. Correlate clinically. 2. Somewhat distended and thick-walled appearance of the gallbladder which could be sympathetic due to adjacent pancreatitis versus representing changes of acute cholecystitis. 3. Normal size spleen with splenic granulomata. 4. Water attenuating cyst off the lower pole the right kidney measuring 19 mm. 5. Abnormal segmental thickening of the ascending colon, neoplasm versus marked inflammatory change/colitis. 6. Enlarged prostate. 7. Moderate aortoiliac and branch vessel atherosclerosis without aneurysm. No adenopathy. 8. Small fat containing left inguinal hernia and periumbilical fat containing hernia. Electronically Signed   By: Ashley Royalty M.D.   On: 01/23/2018 20:36   Dg Chest Port 1 View  Result Date: 01/31/2018 CLINICAL DATA:  Shortness of breath EXAM: PORTABLE CHEST 1 VIEW COMPARISON:  January 25, 2018 FINDINGS: There is no edema or consolidation. There is slight left base atelectasis. There is stable cardiomegaly. Pacemaker leads are attached to the right atrium, right ventricle, and coronary sinus. Patient is status post coronary artery bypass grafting. There is aortic atherosclerosis. No adenopathy. No evident bone lesions. IMPRESSION: Mild left base atelectasis. No edema or consolidation. Stable cardiomegaly. Stable pacemaker lead placements. There is aortic atherosclerosis. Aortic Atherosclerosis (ICD10-I70.0). Electronically Signed   By: Lowella Grip III M.D.   On: 01/31/2018 08:12    Microbiology: No results found for this or any previous visit (from the past 240 hour(s)).   Labs: Basic Metabolic Panel: Recent Labs  Lab 02/06/18 0617 02/07/18 0548 02/09/18 0500 02/10/18 0554 02/11/18  2446 02/12/18 0632  NA 139 140 140 140 143 142  K 3.2* 4.3 3.5 3.3* 3.7 4.4  CL 104 103 109 109 112* 108  CO2 26 24 24 24 24 25   GLUCOSE 127* 119* 105* 101* 108* 100*  BUN 13 14 12 11 11 11   CREATININE 1.48* 1.50* 1.37* 1.19 1.31* 1.32*  CALCIUM 8.8* 9.2  8.7* 8.8* 8.9 9.1  MG 1.8  --   --   --   --   --    Liver Function Tests: No results for input(s): AST, ALT, ALKPHOS, BILITOT, PROT, ALBUMIN in the last 168 hours. No results for input(s): LIPASE, AMYLASE in the last 168 hours. No results for input(s): AMMONIA in the last 168 hours. CBC: Recent Labs  Lab 02/07/18 0548 02/08/18 0455 02/10/18 0554 02/11/18 0520 02/12/18 0632  WBC 7.1 5.2 4.6 5.8 5.2  HGB 7.2* 7.3* 8.4* 8.5* 8.6*  HCT 23.6* 23.9* 27.1* 27.4* 28.6*  MCV 89.7 90.9 91.9 93.5 95.0  PLT 133* 131* 139* 141* 146*   Cardiac Enzymes: No results for input(s): CKTOTAL, CKMB, CKMBINDEX, TROPONINI in the last 168 hours. BNP: BNP (last 3 results) Recent Labs    01/26/18 1051  BNP 1,290.3*    ProBNP (last 3 results) No results for input(s): PROBNP in the last 8760 hours.  CBG: Recent Labs  Lab 02/11/18 1224 02/11/18 1705 02/11/18 2156 02/12/18 0820 02/12/18 1231  GLUCAP 143* 167* 156* 94 132*       Signed:  Cristal Deer, MD [facility] 02/12/2018, 4:11 PM

## 2018-02-18 NOTE — Progress Notes (Signed)
[place]  [phone] [phone]) [phone]  ID: Luis Palmer OB: [birth year]  MR#: [id number]  ZMO#:[id number]  Patient Care Team: Idelle Crouch, MD as PCP - General (Internal Medicine)  CHIEF COMPLAINT: Stage IIa adenocarcinoma of the right colon.  INTERVAL HISTORY: Patient returns to clinic today for further evaluation, discussion of his pathology results, and hospital follow-up.  He continues to feel weak and fatigue, but has improved since his discharge.  He otherwise feels well.  He has no neurologic complaints.  He denies any fevers. He has a good appetite and denies weight loss.  He has no chest pain or shortness of breath.  He denies any nausea, vomiting, constipation, or diarrhea.  He has no melena or hematochezia.  He has no urinary complaints.  Patient offers no further specific complaints today.  REVIEW OF SYSTEMS:   Review of Systems  Constitutional: Positive for malaise/fatigue. Negative for fever and weight loss.  Respiratory: Negative.  Negative for cough, hemoptysis and shortness of breath.   Cardiovascular: Negative.  Negative for chest pain and leg swelling.  Gastrointestinal: Negative.  Negative for abdominal pain, blood in stool and melena.  Genitourinary: Negative.  Negative for hematuria.  Musculoskeletal: Negative.  Negative for myalgias.  Skin: Negative.  Negative for rash.  Neurological: Positive for weakness. Negative for dizziness, sensory change and focal weakness.  Psychiatric/Behavioral: Negative.  The patient is not nervous/anxious.     As per HPI. Otherwise, a complete review of systems is negative.  PAST MEDICAL HISTORY: Past Medical History:  Diagnosis Date  . AICD (automatic cardioverter/defibrillator) present 2006  . Anemia   . Atrial fibrillation ([place])   . CHF (congestive heart failure) ([place])   . CKD (chronic kidney disease), stage III ([place])   . Colon cancer ([place])    "dx'd 01/24/2018"  . High  cholesterol   . History of blood transfusion 01/22/2018   "I lost blood; HgB extremely low"  . History of gout   . Myocardial infarction ([place]) 1986  . OSA on CPAP   . Pneumonia 2015  . Type II diabetes mellitus ([place])     PAST SURGICAL HISTORY: Past Surgical History:  Procedure Laterality Date  . APPENDECTOMY    . ATRIAL FIBRILLATION ABLATION  X 2  . CARDIAC CATHETERIZATION    . CARDIAC DEFIBRILLATOR PLACEMENT  2006   "added lead and had battery changed once since the initial OR" (01/29/2018)  . CATARACT EXTRACTION W/ INTRAOCULAR LENS  IMPLANT, BILATERAL Bilateral   . COLONOSCOPY WITH PROPOFOL N/A 01/23/2018   Procedure: COLONOSCOPY WITH PROPOFOL;  Surgeon: Lucilla Lame, MD;  Location: [facility] ENDOSCOPY;  Service: Endoscopy;  Laterality: N/A;  . CORONARY ARTERY BYPASS GRAFT  1986   "CABG X2"  . CYSTOSCOPY N/A 01/30/2018   Procedure: CYSTOSCOPY FLEXIBLE WITH INSERTION OF CATHETER;  Surgeon: Jovita Kussmaul, MD;  Location: [place];  Service: General;  Laterality: N/A;  . ESOPHAGOGASTRODUODENOSCOPY (EGD) WITH PROPOFOL N/A 01/22/2018   Procedure: ESOPHAGOGASTRODUODENOSCOPY (EGD) WITH PROPOFOL;  Surgeon: Lucilla Lame, MD;  Location: [facility] ENDOSCOPY;  Service: Endoscopy;  Laterality: N/A;  . INSERT / REPLACE / REMOVE PACEMAKER    . LAPAROSCOPIC PARTIAL COLECTOMY N/A 01/30/2018   Procedure: LAPAROSCOPIC ASSISTED EXTENDED RIGHT COLECTOMY;  Surgeon: Jovita Kussmaul, MD;  Location: [place];  Service: General;  Laterality: N/A;  . NASAL SINUS SURGERY      FAMILY HISTORY: Family History  Problem Relation Age of Onset  . Congestive Heart Failure Mother   . Diabetes  Father   . Stroke Father   . Lung cancer Brother     ADVANCED DIRECTIVES (Y/N):  N  HEALTH MAINTENANCE: Social History   Tobacco Use  . Smoking status: Former Smoker    Packs/day: 1.00    Years: 40.00    Pack years: 40.00    Types: Cigarettes    Last attempt to quit: 1986    Years since quitting: 33.7  . Smokeless tobacco: Never  Used  Substance Use Topics  . Alcohol use: Not Currently  . Drug use: Never     Colonoscopy:  PAP:  Bone density:  Lipid panel:  Allergies  Allergen Reactions  . Pseudoephedrine Hcl Other (See Comments)    Other Reaction: ANURIA  . Ace Inhibitors Cough and Other (See Comments)    cough cough   . Nsaids Other (See Comments)    Reaction:  Fluid retention   . Vasotec [Enalapril] Cough    Current Outpatient Medications  Medication Sig Dispense Refill  . acetaminophen (TYLENOL) 500 MG tablet Take 2 tablets (1,000 mg total) by mouth every 8 (eight) hours. 30 tablet 0  . betamethasone dipropionate 0.05 % lotion Apply 1 application topically daily as needed for itching or dry skin.    . carvedilol (COREG) 12.5 MG tablet Take 12.5 mg by mouth 2 (two) times daily.    . Cholecalciferol (VITAMIN D) 2000 UNITS CAPS Take 1 capsule by mouth daily.    Marland Kitchen donepezil (ARICEPT) 10 MG tablet Take 10 mg by mouth at bedtime.    Marland Kitchen ezetimibe (ZETIA) 10 MG tablet Take 10 mg by mouth at bedtime.    . furosemide (LASIX) 40 MG tablet Take 40 mg by mouth daily.    Marland Kitchen losartan (COZAAR) 25 MG tablet Take 25 mg by mouth daily.    . meclizine (ANTIVERT) 25 MG tablet Take 25 mg by mouth daily.     . methocarbamol (ROBAXIN) 500 MG tablet Take 1 tablet (500 mg total) by mouth every 8 (eight) hours as needed for muscle spasms. 60 tablet 0  . Multiple Vitamin (MULTIVITAMIN WITH MINERALS) TABS tablet Take 1 tablet by mouth daily. 30 tablet 0  . pantoprazole (PROTONIX) 40 MG tablet Take 1 tablet (40 mg total) by mouth 2 (two) times daily.    . polyethylene glycol (MIRALAX / GLYCOLAX) packet Take 17 g by mouth daily. 14 each 0  . rosuvastatin (CRESTOR) 10 MG tablet Take 10 mg by mouth daily.    . tamsulosin (FLOMAX) 0.4 MG CAPS capsule Take 1 capsule (0.4 mg total) by mouth at bedtime. 30 capsule 0  . warfarin (COUMADIN) 5 MG tablet Take 1 tablet (5 mg total) by mouth one time only at 6 PM. (Patient taking differently:  Take 5 mg by mouth one time only at 6 PM. ) 30 tablet 0  . Iodoquinol-HC-Aloe Polysacch (ALCORTIN A) 1-2-1 % GEL Apply 1 application topically as needed for dry skin.  2   No current facility-administered medications for this visit.     OBJECTIVE: Vitals:   02/21/18 1304  BP: 114/66  Pulse: 67  Resp: 18  Temp: (!) 96.8 F (36 C)     Body mass index is 29.73 kg/m.    ECOG FS:0 - Asymptomatic  General: Well-developed, well-nourished, no acute distress. Eyes: Pink conjunctiva, anicteric sclera. HEENT: Normocephalic, moist mucous membranes, clear oropharnyx. Lungs: Clear to auscultation bilaterally. Heart: Regular rate and rhythm. No rubs, murmurs, or gallops. Abdomen: Soft, nontender, nondistended. No organomegaly noted, normoactive bowel sounds.  Musculoskeletal: No edema, cyanosis, or clubbing. Neuro: Alert, answering all questions appropriately. Cranial nerves grossly intact. Skin: No rashes or petechiae noted. Psych: Normal affect. Lymphatics: No cervical, calvicular, axillary or inguinal LAD.  LAB RESULTS:  Lab Results  Component Value Date   NA 142 02/12/2018   K 4.4 02/12/2018   CL 108 02/12/2018   CO2 25 02/12/2018   GLUCOSE 100 (H) 02/12/2018   BUN 11 02/12/2018   CREATININE 1.32 (H) 02/12/2018   CALCIUM 9.1 02/12/2018   PROT 5.8 (L) 02/01/2018   ALBUMIN 2.9 (L) 02/01/2018   AST 10 (L) 02/01/2018   ALT 11 02/01/2018   ALKPHOS 51 02/01/2018   BILITOT 1.0 02/01/2018   GFRNONAA 48 (L) 02/12/2018   GFRAA 56 (L) 02/12/2018    Lab Results  Component Value Date   WBC 3.0 (L) 02/21/2018   NEUTROABS 4.0 01/31/2018   HGB 9.5 (L) 02/21/2018   HCT 29.1 (L) 02/21/2018   MCV 90.2 02/21/2018   PLT 130 (L) 02/21/2018   Lab Results  Component Value Date   IRON 37 (L) 02/21/2018   TIBC 291 02/21/2018   IRONPCTSAT 13 (L) 02/21/2018   Lab Results  Component Value Date   FERRITIN 477 (H) 02/21/2018      STUDIES: Dg Chest Port 1 View  Result Date:  01/31/2018 CLINICAL DATA:  Shortness of breath EXAM: PORTABLE CHEST 1 VIEW COMPARISON:  January 25, 2018 FINDINGS: There is no edema or consolidation. There is slight left base atelectasis. There is stable cardiomegaly. Pacemaker leads are attached to the right atrium, right ventricle, and coronary sinus. Patient is status post coronary artery bypass grafting. There is aortic atherosclerosis. No adenopathy. No evident bone lesions. IMPRESSION: Mild left base atelectasis. No edema or consolidation. Stable cardiomegaly. Stable pacemaker lead placements. There is aortic atherosclerosis. Aortic Atherosclerosis (ICD10-I70.0). Electronically Signed   By: Lowella Grip III M.D.   On: 01/31/2018 08:12    ASSESSMENT: Stage IIa adenocarcinoma of the right colon.  PLAN:   1. Stage IIa adenocarcinoma of the right colon: Patient underwent partial colectomy on January 30, 2018 revealing the above-stated malignancy.  0 of 27 lymph nodes were negative for disease.  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  He was also noted to be MSI high.  Patients with MSI high may have a better prognosis and typically do not benefit from adjuvant 5-FU therapy.  Also, will send patient pathology for the BRAF-E mutation with reflex to [facility] hyper methylation.  Patient's preop CEA was elevated at 8.3.  No intervention is needed at this time.  Return to clinic in 1 month with repeat laboratory can further evaluation. 2.  Iron deficiency anemia: Likely secondary to above colon cancer.  Patient's hemoglobin has slowly improved to 9.5.  Iron stores are decreased and patient may benefit from IV Feraheme in the future.  Continue to monitor.  I spent a total of 30 minutes face-to-face with the patient of which greater than 50% of the visit was spent in counseling and coordination of care as detailed above.    Patient expressed understanding and was in agreement with this plan. He also understands that He can  call clinic at any time with any questions, concerns, or complaints.    Lloyd Huger, MD   02/25/2018 7:57 AM

## 2018-02-20 ENCOUNTER — Other Ambulatory Visit: Payer: Self-pay

## 2018-02-20 DIAGNOSIS — D508 Other iron deficiency anemias: Secondary | ICD-10-CM

## 2018-02-21 ENCOUNTER — Inpatient Hospital Stay: Payer: Medicare Other | Attending: Oncology

## 2018-02-21 ENCOUNTER — Other Ambulatory Visit: Payer: Self-pay

## 2018-02-21 ENCOUNTER — Inpatient Hospital Stay (HOSPITAL_BASED_OUTPATIENT_CLINIC_OR_DEPARTMENT_OTHER): Payer: Medicare Other | Admitting: Oncology

## 2018-02-21 ENCOUNTER — Inpatient Hospital Stay: Payer: Medicare Other

## 2018-02-21 VITALS — BP 114/66 | HR 67 | Temp 96.8°F | Resp 18 | Wt 219.2 lb

## 2018-02-21 DIAGNOSIS — Z801 Family history of malignant neoplasm of trachea, bronchus and lung: Secondary | ICD-10-CM | POA: Diagnosis not present

## 2018-02-21 DIAGNOSIS — D509 Iron deficiency anemia, unspecified: Secondary | ICD-10-CM | POA: Diagnosis not present

## 2018-02-21 DIAGNOSIS — C182 Malignant neoplasm of ascending colon: Secondary | ICD-10-CM

## 2018-02-21 DIAGNOSIS — Z9049 Acquired absence of other specified parts of digestive tract: Secondary | ICD-10-CM | POA: Diagnosis not present

## 2018-02-21 DIAGNOSIS — Z87891 Personal history of nicotine dependence: Secondary | ICD-10-CM | POA: Diagnosis not present

## 2018-02-21 DIAGNOSIS — Z95810 Presence of automatic (implantable) cardiac defibrillator: Secondary | ICD-10-CM | POA: Diagnosis not present

## 2018-02-21 DIAGNOSIS — Z79899 Other long term (current) drug therapy: Secondary | ICD-10-CM | POA: Diagnosis not present

## 2018-02-21 DIAGNOSIS — Z7901 Long term (current) use of anticoagulants: Secondary | ICD-10-CM | POA: Diagnosis not present

## 2018-02-21 DIAGNOSIS — I252 Old myocardial infarction: Secondary | ICD-10-CM | POA: Diagnosis not present

## 2018-02-21 DIAGNOSIS — C189 Malignant neoplasm of colon, unspecified: Secondary | ICD-10-CM

## 2018-02-21 DIAGNOSIS — D508 Other iron deficiency anemias: Secondary | ICD-10-CM

## 2018-02-21 LAB — FERRITIN: FERRITIN: 477 ng/mL — AB (ref 24–336)

## 2018-02-21 LAB — CBC
HEMATOCRIT: 29.1 % — AB (ref 40.0–52.0)
HEMOGLOBIN: 9.5 g/dL — AB (ref 13.0–18.0)
MCH: 29.6 pg (ref 26.0–34.0)
MCHC: 32.8 g/dL (ref 32.0–36.0)
MCV: 90.2 fL (ref 80.0–100.0)
Platelets: 130 10*3/uL — ABNORMAL LOW (ref 150–440)
RBC: 3.23 MIL/uL — ABNORMAL LOW (ref 4.40–5.90)
RDW: 20.2 % — ABNORMAL HIGH (ref 11.5–14.5)
WBC: 3 10*3/uL — AB (ref 3.8–10.6)

## 2018-02-21 LAB — IRON AND TIBC
Iron: 37 ug/dL — ABNORMAL LOW (ref 45–182)
Saturation Ratios: 13 % — ABNORMAL LOW (ref 17.9–39.5)
TIBC: 291 ug/dL (ref 250–450)
UIBC: 254 ug/dL

## 2018-02-21 NOTE — Progress Notes (Signed)
Here for follow up.pt recently hospitalized"-feeling weak but better" -pt stated

## 2018-03-08 ENCOUNTER — Ambulatory Visit: Payer: Medicare Other | Admitting: Oncology

## 2018-03-08 ENCOUNTER — Other Ambulatory Visit: Payer: Medicare Other

## 2018-03-08 ENCOUNTER — Ambulatory Visit: Payer: Medicare Other

## 2018-03-26 NOTE — Progress Notes (Signed)
[place]  Telephone:(336) [phone] Fax:(336) [phone]  ID: Luis Palmer OB: 09-12-1932  MR#: [id number]  QIO#:[id number]  Patient Care Team: Idelle Crouch, MD as PCP - General (Internal Medicine)  CHIEF COMPLAINT: Stage IIa adenocarcinoma of the right colon.  INTERVAL HISTORY: Patient returns to clinic today for repeat laboratory work and further evaluation.  He continues to have weakness and fatigue, but this is slowly improving.  He admits to not completing his home exercise appointments as directed.  He otherwise feels well.  He has no neurologic complaints.  He denies any recent fevers or illnesses.  He has a good appetite and denies weight loss.  He has no chest pain or shortness of breath.  He denies any nausea, vomiting, constipation, or diarrhea.  He has no melena or hematochezia.  He has no urinary complaints.  Patient offers no further specific complaints.  REVIEW OF SYSTEMS:   Review of Systems  Constitutional: Positive for malaise/fatigue. Negative for fever and weight loss.  Respiratory: Negative.  Negative for cough, hemoptysis and shortness of breath.   Cardiovascular: Negative.  Negative for chest pain and leg swelling.  Gastrointestinal: Negative.  Negative for abdominal pain, blood in stool and melena.  Genitourinary: Negative.  Negative for hematuria.  Musculoskeletal: Negative.  Negative for myalgias.  Skin: Negative.  Negative for rash.  Neurological: Positive for weakness. Negative for dizziness, sensory change and focal weakness.  Psychiatric/Behavioral: Negative.  The patient is not nervous/anxious.     As per HPI. Otherwise, a complete review of systems is negative.  PAST MEDICAL HISTORY: Past Medical History:  Diagnosis Date  . AICD (automatic cardioverter/defibrillator) present 2006  . Anemia   . Atrial fibrillation ([place])   . CHF (congestive heart failure) ([place])   . CKD (chronic kidney disease), stage III ([place])   . Colon  cancer ([place])    "dx'd 01/24/2018"  . High cholesterol   . History of blood transfusion 01/22/2018   "I lost blood; HgB extremely low"  . History of gout   . Myocardial infarction ([place]) 1986  . OSA on CPAP   . Pneumonia 2015  . Type II diabetes mellitus ([place])     PAST SURGICAL HISTORY: Past Surgical History:  Procedure Laterality Date  . APPENDECTOMY    . ATRIAL FIBRILLATION ABLATION  X 2  . CARDIAC CATHETERIZATION    . CARDIAC DEFIBRILLATOR PLACEMENT  2006   "added lead and had battery changed once since the initial OR" (01/29/2018)  . CATARACT EXTRACTION W/ INTRAOCULAR LENS  IMPLANT, BILATERAL Bilateral   . COLONOSCOPY WITH PROPOFOL N/A 01/23/2018   Procedure: COLONOSCOPY WITH PROPOFOL;  Surgeon: Lucilla Lame, MD;  Location: [facility] ENDOSCOPY;  Service: Endoscopy;  Laterality: N/A;  . CORONARY ARTERY BYPASS GRAFT  1986   "CABG X2"  . CYSTOSCOPY N/A 01/30/2018   Procedure: CYSTOSCOPY FLEXIBLE WITH INSERTION OF CATHETER;  Surgeon: Jovita Kussmaul, MD;  Location: [facility];  Service: General;  Laterality: N/A;  . ESOPHAGOGASTRODUODENOSCOPY (EGD) WITH PROPOFOL N/A 01/22/2018   Procedure: ESOPHAGOGASTRODUODENOSCOPY (EGD) WITH PROPOFOL;  Surgeon: Lucilla Lame, MD;  Location: [facility] ENDOSCOPY;  Service: Endoscopy;  Laterality: N/A;  . INSERT / REPLACE / REMOVE PACEMAKER    . LAPAROSCOPIC PARTIAL COLECTOMY N/A 01/30/2018   Procedure: LAPAROSCOPIC ASSISTED EXTENDED RIGHT COLECTOMY;  Surgeon: Jovita Kussmaul, MD;  Location: [place];  Service: General;  Laterality: N/A;  . NASAL SINUS SURGERY      FAMILY HISTORY: Family History  Problem Relation Age of Onset  .  Congestive Heart Failure Mother   . Diabetes Father   . Stroke Father   . Lung cancer Brother     ADVANCED DIRECTIVES (Y/N):  N  HEALTH MAINTENANCE: Social History   Tobacco Use  . Smoking status: Former Smoker    Packs/day: 1.00    Years: 40.00    Pack years: 40.00    Types: Cigarettes    Last attempt to quit: 1986    Years since  quitting: 33.8  . Smokeless tobacco: Never Used  Substance Use Topics  . Alcohol use: Not Currently  . Drug use: Never     Colonoscopy:  PAP:  Bone density:  Lipid panel:  Allergies  Allergen Reactions  . Pseudoephedrine Hcl Other (See Comments)    Other Reaction: ANURIA  . Ace Inhibitors Cough and Other (See Comments)    cough cough   . Nsaids Other (See Comments)    Reaction:  Fluid retention   . Vasotec [Enalapril] Cough    Current Outpatient Medications  Medication Sig Dispense Refill  . acetaminophen (TYLENOL) 500 MG tablet Take 2 tablets (1,000 mg total) by mouth every 8 (eight) hours. 30 tablet 0  . carvedilol (COREG) 12.5 MG tablet Take 12.5 mg by mouth 2 (two) times daily.    . Cholecalciferol (VITAMIN D) 2000 UNITS CAPS Take 1 capsule by mouth daily.    Marland Kitchen donepezil (ARICEPT) 10 MG tablet Take 10 mg by mouth at bedtime.    Marland Kitchen ezetimibe (ZETIA) 10 MG tablet Take 10 mg by mouth at bedtime.    . furosemide (LASIX) 40 MG tablet Take 40 mg by mouth daily.    Jerrye Beavers Polysacch (ALCORTIN A) 1-2-1 % GEL Apply 1 application topically as needed for dry skin.  2  . losartan (COZAAR) 25 MG tablet Take 25 mg by mouth daily.    . meclizine (ANTIVERT) 25 MG tablet Take 25 mg by mouth daily.     . metFORMIN (GLUCOPHAGE) 500 MG tablet Take 1 tablet by mouth 2 (two) times daily.    . Multiple Vitamin (MULTIVITAMIN WITH MINERALS) TABS tablet Take 1 tablet by mouth daily. 30 tablet 0  . polyethylene glycol (MIRALAX / GLYCOLAX) packet Take 17 g by mouth daily. 14 each 0  . rosuvastatin (CRESTOR) 10 MG tablet Take 10 mg by mouth daily.    . tamsulosin (FLOMAX) 0.4 MG CAPS capsule Take 1 capsule (0.4 mg total) by mouth at bedtime. 30 capsule 0  . warfarin (COUMADIN) 5 MG tablet Take 1 tablet (5 mg total) by mouth one time only at 6 PM. (Patient taking differently: Take 5 mg by mouth one time only at 6 PM. ) 30 tablet 0   No current facility-administered medications for this  visit.     OBJECTIVE: Vitals:   03/28/18 1011 03/28/18 1021  BP:  (!) 97/56  Pulse:  60  Resp: 16   Temp:  98 F (36.7 C)     Body mass index is 29.43 kg/m.    ECOG FS:0 - Asymptomatic  General: Well-developed, well-nourished, no acute distress. Eyes: Pink conjunctiva, anicteric sclera. HEENT: Normocephalic, moist mucous membranes. Lungs: Clear to auscultation bilaterally. Heart: Regular rate and rhythm. No rubs, murmurs, or gallops. Abdomen: Soft, nontender, nondistended. No organomegaly noted, normoactive bowel sounds. Musculoskeletal: No edema, cyanosis, or clubbing. Neuro: Alert, answering all questions appropriately. Cranial nerves grossly intact. Skin: No rashes or petechiae noted. Psych: Normal affect.  LAB RESULTS:  Lab Results  Component Value Date   NA 142  02/12/2018   K 4.4 02/12/2018   CL 108 02/12/2018   CO2 25 02/12/2018   GLUCOSE 100 (H) 02/12/2018   BUN 11 02/12/2018   CREATININE 1.32 (H) 02/12/2018   CALCIUM 9.1 02/12/2018   PROT 5.8 (L) 02/01/2018   ALBUMIN 2.9 (L) 02/01/2018   AST 10 (L) 02/01/2018   ALT 11 02/01/2018   ALKPHOS 51 02/01/2018   BILITOT 1.0 02/01/2018   GFRNONAA 48 (L) 02/12/2018   GFRAA 56 (L) 02/12/2018    Lab Results  Component Value Date   WBC 6.3 03/28/2018   NEUTROABS 2.8 03/28/2018   HGB 10.4 (L) 03/28/2018   HCT 32.8 (L) 03/28/2018   MCV 89.4 03/28/2018   PLT 155 03/28/2018   Lab Results  Component Value Date   IRON 22 (L) 03/28/2018   TIBC 295 03/28/2018   IRONPCTSAT 8 (L) 03/28/2018   Lab Results  Component Value Date   FERRITIN 319 03/28/2018      STUDIES: No results found.  ASSESSMENT: Stage IIa adenocarcinoma of the right colon.  PLAN:   1. Stage IIa adenocarcinoma of the right colon: Patient underwent partial colectomy on January 30, 2018 revealing the above-stated malignancy.  0 of 27 lymph nodes were negative for disease.  Patient noted to have a loss of mismatch repair proteins MLHH1 and  PMS2.  He was also noted to be MSI high.  Patients with MSI high may have a better prognosis and typically do not benefit from adjuvant 5-FU therapy.  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  Patient's preop CEA was elevated at 8.3, it is now within normal limits at 2.6.  No intervention is needed at this time.  Return to clinic in 2 months for repeat laboratory work and further evaluation. 2.  Iron deficiency anemia: Likely secondary to above colon cancer.  Patient's hemoglobin has slowly improved to 10.4, although iron stores remind decreased.  He may benefit from IV Feraheme in the future. Continue to monitor.  I spent a total of 30 minutes face-to-face with the patient of which greater than 50% of the visit was spent in counseling and coordination of care as detailed above.   Patient expressed understanding and was in agreement with this plan. He also understands that He can call clinic at any time with any questions, concerns, or complaints.    Lloyd Huger, MD   03/30/2018 1:00 PM

## 2018-03-28 ENCOUNTER — Encounter: Payer: Self-pay | Admitting: Oncology

## 2018-03-28 ENCOUNTER — Inpatient Hospital Stay (HOSPITAL_BASED_OUTPATIENT_CLINIC_OR_DEPARTMENT_OTHER): Payer: Medicare Other | Admitting: Oncology

## 2018-03-28 ENCOUNTER — Inpatient Hospital Stay: Payer: Medicare Other | Attending: Oncology

## 2018-03-28 ENCOUNTER — Other Ambulatory Visit: Payer: Self-pay

## 2018-03-28 VITALS — BP 97/56 | HR 60 | Temp 98.0°F | Resp 16 | Ht 72.0 in | Wt 217.0 lb

## 2018-03-28 DIAGNOSIS — Z87891 Personal history of nicotine dependence: Secondary | ICD-10-CM | POA: Insufficient documentation

## 2018-03-28 DIAGNOSIS — E119 Type 2 diabetes mellitus without complications: Secondary | ICD-10-CM | POA: Insufficient documentation

## 2018-03-28 DIAGNOSIS — C182 Malignant neoplasm of ascending colon: Secondary | ICD-10-CM | POA: Insufficient documentation

## 2018-03-28 DIAGNOSIS — C189 Malignant neoplasm of colon, unspecified: Secondary | ICD-10-CM

## 2018-03-28 DIAGNOSIS — Z7984 Long term (current) use of oral hypoglycemic drugs: Secondary | ICD-10-CM | POA: Insufficient documentation

## 2018-03-28 DIAGNOSIS — Z79899 Other long term (current) drug therapy: Secondary | ICD-10-CM | POA: Diagnosis not present

## 2018-03-28 DIAGNOSIS — I4891 Unspecified atrial fibrillation: Secondary | ICD-10-CM | POA: Diagnosis not present

## 2018-03-28 DIAGNOSIS — D508 Other iron deficiency anemias: Secondary | ICD-10-CM | POA: Insufficient documentation

## 2018-03-28 DIAGNOSIS — Z7901 Long term (current) use of anticoagulants: Secondary | ICD-10-CM

## 2018-03-28 LAB — CBC WITH DIFFERENTIAL/PLATELET
Abs Immature Granulocytes: 0.03 10*3/uL (ref 0.00–0.07)
Basophils Absolute: 0 10*3/uL (ref 0.0–0.1)
Basophils Relative: 0 %
EOS PCT: 4 %
Eosinophils Absolute: 0.2 10*3/uL (ref 0.0–0.5)
HEMATOCRIT: 32.8 % — AB (ref 39.0–52.0)
HEMOGLOBIN: 10.4 g/dL — AB (ref 13.0–17.0)
Immature Granulocytes: 1 %
LYMPHS ABS: 1.9 10*3/uL (ref 0.7–4.0)
LYMPHS PCT: 29 %
MCH: 28.3 pg (ref 26.0–34.0)
MCHC: 31.7 g/dL (ref 30.0–36.0)
MCV: 89.4 fL (ref 80.0–100.0)
MONO ABS: 1.4 10*3/uL — AB (ref 0.1–1.0)
Monocytes Relative: 23 %
Neutro Abs: 2.8 10*3/uL (ref 1.7–7.7)
Neutrophils Relative %: 43 %
Platelets: 155 10*3/uL (ref 150–400)
RBC: 3.67 MIL/uL — ABNORMAL LOW (ref 4.22–5.81)
RDW: 17 % — ABNORMAL HIGH (ref 11.5–15.5)
WBC: 6.3 10*3/uL (ref 4.0–10.5)
nRBC: 0 % (ref 0.0–0.2)

## 2018-03-28 LAB — IRON AND TIBC
Iron: 22 ug/dL — ABNORMAL LOW (ref 45–182)
Saturation Ratios: 8 % — ABNORMAL LOW (ref 17.9–39.5)
TIBC: 295 ug/dL (ref 250–450)
UIBC: 273 ug/dL

## 2018-03-28 LAB — FERRITIN: Ferritin: 319 ng/mL (ref 24–336)

## 2018-03-28 NOTE — Progress Notes (Signed)
Patient here for follow up. He has had his colon surgery since his last appointment.

## 2018-03-29 LAB — CEA: CEA: 2.6 ng/mL (ref 0.0–4.7)

## 2018-04-01 ENCOUNTER — Other Ambulatory Visit: Payer: Self-pay | Admitting: Oncology

## 2018-04-02 DIAGNOSIS — C186 Malignant neoplasm of descending colon: Secondary | ICD-10-CM | POA: Insufficient documentation

## 2018-04-30 ENCOUNTER — Telehealth: Payer: Self-pay | Admitting: *Deleted

## 2018-04-30 NOTE — Telephone Encounter (Signed)
Patient states Dr Grayland Ormond was checking to see if his caner is hereditary and is asking if he found results? Please return his call [phone]

## 2018-05-01 ENCOUNTER — Other Ambulatory Visit: Payer: Self-pay | Admitting: *Deleted

## 2018-05-01 DIAGNOSIS — C189 Malignant neoplasm of colon, unspecified: Secondary | ICD-10-CM

## 2018-05-01 NOTE — Telephone Encounter (Signed)
Referral has been entered, patient will be scheduled for first available appointment.

## 2018-05-01 NOTE — Progress Notes (Signed)
abu

## 2018-05-01 NOTE — Telephone Encounter (Signed)
Patient states he would like to have testing done and willing to see genetic counselor, he was upset that this was not already done as he states it was discussed at his appointment to know if his children are at risk.. Please get him scheduled ASAP and call him with appointment.

## 2018-05-01 NOTE — Telephone Encounter (Signed)
The tests that were ordered to check for mutations were for prognostic purposes, patient is MSI High and BRAF positive. The results of these tests were discussed with patient at last visit by Dr. Grayland Ormond. Per Dr. Grayland Ormond he can set up patient appointment for genetic counseling to evaluate for genetic testing if patient desires.

## 2018-05-25 NOTE — Progress Notes (Signed)
[place]  [phone] Fax:(336) [phone]  ID: Luis Palmer OB: 05-Nov-1932  MR#: [id number]  YIR#:[id number]  Patient Care Team: Idelle Crouch, MD as PCP - General (Internal Medicine)  CHIEF COMPLAINT: Stage IIa adenocarcinoma of the right colon.  INTERVAL HISTORY: Patient returns to clinic today for repeat laboratory work and routine 29-monthevaluation.  He continues to have mild weakness and fatigue, but states this continues to improve.  He otherwise feels well.  He has no neurologic complaints.  He denies any recent fevers or illnesses.  He has a good appetite and denies weight loss.  He has no chest pain or shortness of breath.  He denies any nausea, vomiting, constipation, or diarrhea.  He has no melena or hematochezia.  He has no urinary complaints.  Patient offers no further specific complaints.  REVIEW OF SYSTEMS:   Review of Systems  Constitutional: Positive for malaise/fatigue. Negative for fever and weight loss.  Respiratory: Negative.  Negative for cough, hemoptysis and shortness of breath.   Cardiovascular: Negative.  Negative for chest pain and leg swelling.  Gastrointestinal: Negative.  Negative for abdominal pain, blood in stool and melena.  Genitourinary: Negative.  Negative for hematuria.  Musculoskeletal: Negative.  Negative for myalgias.  Skin: Negative.  Negative for rash.  Neurological: Positive for weakness. Negative for dizziness, sensory change and focal weakness.  Psychiatric/Behavioral: Negative.  The patient is not nervous/anxious.     As per HPI. Otherwise, a complete review of systems is negative.  PAST MEDICAL HISTORY: Past Medical History:  Diagnosis Date  . AICD (automatic cardioverter/defibrillator) present 2006  . Anemia   . Atrial fibrillation ([place]   . CHF (congestive heart failure) ([place]   . CKD (chronic kidney disease), stage III ([place]   . Colon cancer ([place]    "dx'd 01/24/2018"  . High cholesterol    . History of blood transfusion 01/22/2018   "I lost blood; HgB extremely low"  . History of gout   . Myocardial infarction ([place] 1986  . OSA on CPAP   . Pneumonia 2015  . Type II diabetes mellitus ([facility]     PAST SURGICAL HISTORY: Past Surgical History:  Procedure Laterality Date  . APPENDECTOMY    . ATRIAL FIBRILLATION ABLATION  X 2  . CARDIAC CATHETERIZATION    . CARDIAC DEFIBRILLATOR PLACEMENT  2006   "added lead and had battery changed once since the initial OR" (01/29/2018)  . CATARACT EXTRACTION W/ INTRAOCULAR LENS  IMPLANT, BILATERAL Bilateral   . COLONOSCOPY WITH PROPOFOL N/A 01/23/2018   Procedure: COLONOSCOPY WITH PROPOFOL;  Surgeon: WLucilla Lame MD;  [facility]  Service: Endoscopy;  Laterality: N/A;  . CORONARY ARTERY BYPASS GRAFT  1986   "CABG X2"  . CYSTOSCOPY N/A 01/30/2018   Procedure: CYSTOSCOPY FLEXIBLE WITH INSERTION OF CATHETER;  Surgeon: TJovita Kussmaul MD;  [place]  Service: General;  Laterality: N/A;  . ESOPHAGOGASTRODUODENOSCOPY (EGD) WITH PROPOFOL N/A 01/22/2018   Procedure: ESOPHAGOGASTRODUODENOSCOPY (EGD) WITH PROPOFOL;  Surgeon: WLucilla Lame MD;  Location: [facility] ENDOSCOPY;  Service: Endoscopy;  Laterality: N/A;  . INSERT / REPLACE / REMOVE PACEMAKER    . LAPAROSCOPIC PARTIAL COLECTOMY N/A 01/30/2018   Procedure: LAPAROSCOPIC ASSISTED EXTENDED RIGHT COLECTOMY;  Surgeon: TJovita Kussmaul MD;  [place] [facility]  Service: General;  Laterality: N/A;  . NASAL SINUS SURGERY      FAMILY HISTORY: Family History  Problem Relation Age of Onset  . Congestive Heart Failure Mother   .  Diabetes Father   . Stroke Father   . Lung cancer Brother     ADVANCED DIRECTIVES (Y/N):  N  HEALTH MAINTENANCE: Social History   Tobacco Use  . Smoking status: Former Smoker    Packs/day: 1.00    Years: 40.00    Pack years: 40.00    Types: Cigarettes    Last attempt to quit: 1986    Years since quitting: 34.0  . Smokeless tobacco: Never Used    Substance Use Topics  . Alcohol use: Not Currently  . Drug use: Never     Colonoscopy:  PAP:  Bone density:  Lipid panel:  Allergies  Allergen Reactions  . Pseudoephedrine Hcl Other (See Comments)    Other Reaction: ANURIA  . Ace Inhibitors Cough and Other (See Comments)    cough cough   . Nsaids Other (See Comments)    Reaction:  Fluid retention   . Vasotec [Enalapril] Cough    Current Outpatient Medications  Medication Sig Dispense Refill  . acetaminophen (TYLENOL) 500 MG tablet Take 2 tablets (1,000 mg total) by mouth every 8 (eight) hours. 30 tablet 0  . apixaban (ELIQUIS) 5 MG TABS tablet Take 5 mg by mouth 2 (two) times daily.    . carvedilol (COREG) 12.5 MG tablet Take 12.5 mg by mouth 2 (two) times daily.    . Cholecalciferol (VITAMIN D) 2000 UNITS CAPS Take 1 capsule by mouth daily.    Marland Kitchen donepezil (ARICEPT) 10 MG tablet Take 10 mg by mouth at bedtime.    Marland Kitchen ezetimibe (ZETIA) 10 MG tablet Take 10 mg by mouth at bedtime.    . furosemide (LASIX) 40 MG tablet Take 40 mg by mouth daily.    Jerrye Beavers Polysacch (ALCORTIN A) 1-2-1 % GEL Apply 1 application topically as needed for dry skin.  2  . meclizine (ANTIVERT) 25 MG tablet Take 25 mg by mouth daily.     . metFORMIN (GLUCOPHAGE) 500 MG tablet Take 1 tablet by mouth 2 (two) times daily.    . Multiple Vitamin (MULTIVITAMIN WITH MINERALS) TABS tablet Take 1 tablet by mouth daily. 30 tablet 0  . polyethylene glycol (MIRALAX / GLYCOLAX) packet Take 17 g by mouth daily. 14 each 0  . rosuvastatin (CRESTOR) 10 MG tablet Take 10 mg by mouth daily.    . sacubitril-valsartan (ENTRESTO) 24-26 MG Take 1 tablet by mouth 2 (two) times daily.    . tamsulosin (FLOMAX) 0.4 MG CAPS capsule Take 1 capsule (0.4 mg total) by mouth at bedtime. 30 capsule 0   No current facility-administered medications for this visit.     OBJECTIVE: Vitals:   05/28/18 1042  BP: 105/79  Pulse: 71  Resp: 18  Temp: (!) 95.4 F (35.2 C)   SpO2: 98%     Body mass index is 29.7 kg/m.    ECOG FS:0 - Asymptomatic  General: Well-developed, well-nourished, no acute distress. Eyes: Pink conjunctiva, anicteric sclera. HEENT: Normocephalic, moist mucous membranes. Lungs: Clear to auscultation bilaterally. Heart: Regular rate and rhythm. No rubs, murmurs, or gallops. Abdomen: Soft, nontender, nondistended. No organomegaly noted, normoactive bowel sounds. Musculoskeletal: No edema, cyanosis, or clubbing. Neuro: Alert, answering all questions appropriately. Cranial nerves grossly intact. Skin: No rashes or petechiae noted. Psych: Normal affect.  LAB RESULTS:  Lab Results  Component Value Date   NA 142 02/12/2018   K 4.4 02/12/2018   CL 108 02/12/2018   CO2 25 02/12/2018   GLUCOSE 100 (H) 02/12/2018   BUN 11 02/12/2018  CREATININE 1.32 (H) 02/12/2018   CALCIUM 9.1 02/12/2018   PROT 5.8 (L) 02/01/2018   ALBUMIN 2.9 (L) 02/01/2018   AST 10 (L) 02/01/2018   ALT 11 02/01/2018   ALKPHOS 51 02/01/2018   BILITOT 1.0 02/01/2018   GFRNONAA 48 (L) 02/12/2018   GFRAA 56 (L) 02/12/2018    Lab Results  Component Value Date   WBC 3.2 (L) 05/28/2018   NEUTROABS 1.3 (L) 05/28/2018   HGB 9.8 (L) 05/28/2018   HCT 31.5 (L) 05/28/2018   MCV 88.5 05/28/2018   PLT 106 (L) 05/28/2018   Lab Results  Component Value Date   IRON 35 (L) 05/28/2018   TIBC 317 05/28/2018   IRONPCTSAT 11 (L) 05/28/2018   Lab Results  Component Value Date   FERRITIN 148 05/28/2018      STUDIES: No results found.  ASSESSMENT: Stage IIa adenocarcinoma of the right colon.  PLAN:   1. Stage IIa adenocarcinoma of the right colon: Patient underwent partial colectomy on January 30, 2018 revealing the above-stated malignancy.  0 of 27 lymph nodes were negative for disease.  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  He was also noted to be MSI high.  Patients with MSI high may have a better prognosis and typically do  not benefit from adjuvant 5-FU therapy.  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  Patient's preop CEA was elevated at 8.3, it is now within normal limits at 2.6.  No intervention is needed at this time.  Patient will require surveillance colonoscopy 6 to 12 months after surgery.  Will also repeat imaging prior to next clinic appointment.  Return to clinic in 3 months with repeat laboratory work and further evaluation.   2.  Iron deficiency anemia: Patient's hemoglobin has slowly trended down and his iron stores remain decreased.  Proceed with 510 mg IV Feraheme today.  Return to clinic as above. 3.  Thrombocytopenia: Mild, monitor.   Patient expressed understanding and was in agreement with this plan. He also understands that He can call clinic at any time with any questions, concerns, or complaints.    Lloyd Huger, MD   05/28/2018 6:20 PM

## 2018-05-28 ENCOUNTER — Inpatient Hospital Stay: Payer: Medicare Other

## 2018-05-28 ENCOUNTER — Inpatient Hospital Stay: Payer: Medicare Other | Attending: Oncology | Admitting: Oncology

## 2018-05-28 ENCOUNTER — Encounter: Payer: Self-pay | Admitting: Oncology

## 2018-05-28 ENCOUNTER — Other Ambulatory Visit: Payer: Self-pay

## 2018-05-28 VITALS — BP 105/79 | HR 71 | Temp 95.4°F | Resp 18 | Wt 219.0 lb

## 2018-05-28 VITALS — BP 125/75 | HR 60 | Temp 95.0°F | Resp 18

## 2018-05-28 DIAGNOSIS — C189 Malignant neoplasm of colon, unspecified: Secondary | ICD-10-CM

## 2018-05-28 DIAGNOSIS — C182 Malignant neoplasm of ascending colon: Secondary | ICD-10-CM | POA: Insufficient documentation

## 2018-05-28 DIAGNOSIS — Z87891 Personal history of nicotine dependence: Secondary | ICD-10-CM | POA: Insufficient documentation

## 2018-05-28 DIAGNOSIS — D509 Iron deficiency anemia, unspecified: Secondary | ICD-10-CM | POA: Insufficient documentation

## 2018-05-28 DIAGNOSIS — D696 Thrombocytopenia, unspecified: Secondary | ICD-10-CM | POA: Diagnosis not present

## 2018-05-28 LAB — CBC WITH DIFFERENTIAL/PLATELET
Abs Immature Granulocytes: 0.01 10*3/uL (ref 0.00–0.07)
Basophils Absolute: 0 10*3/uL (ref 0.0–0.1)
Basophils Relative: 1 %
EOS PCT: 5 %
Eosinophils Absolute: 0.2 10*3/uL (ref 0.0–0.5)
HCT: 31.5 % — ABNORMAL LOW (ref 39.0–52.0)
Hemoglobin: 9.8 g/dL — ABNORMAL LOW (ref 13.0–17.0)
Immature Granulocytes: 0 %
Lymphocytes Relative: 38 %
Lymphs Abs: 1.2 10*3/uL (ref 0.7–4.0)
MCH: 27.5 pg (ref 26.0–34.0)
MCHC: 31.1 g/dL (ref 30.0–36.0)
MCV: 88.5 fL (ref 80.0–100.0)
Monocytes Absolute: 0.5 10*3/uL (ref 0.1–1.0)
Monocytes Relative: 16 %
Neutro Abs: 1.3 10*3/uL — ABNORMAL LOW (ref 1.7–7.7)
Neutrophils Relative %: 40 %
Platelets: 106 10*3/uL — ABNORMAL LOW (ref 150–400)
RBC: 3.56 MIL/uL — ABNORMAL LOW (ref 4.22–5.81)
RDW: 17.2 % — ABNORMAL HIGH (ref 11.5–15.5)
WBC: 3.2 10*3/uL — ABNORMAL LOW (ref 4.0–10.5)
nRBC: 0 % (ref 0.0–0.2)

## 2018-05-28 LAB — IRON AND TIBC
Iron: 35 ug/dL — ABNORMAL LOW (ref 45–182)
SATURATION RATIOS: 11 % — AB (ref 17.9–39.5)
TIBC: 317 ug/dL (ref 250–450)
UIBC: 282 ug/dL

## 2018-05-28 LAB — FERRITIN: Ferritin: 148 ng/mL (ref 24–336)

## 2018-05-28 MED ORDER — SODIUM CHLORIDE 0.9 % IV SOLN
510.0000 mg | Freq: Once | INTRAVENOUS | Status: AC
  Administered 2018-05-28: 510 mg via INTRAVENOUS
  Filled 2018-05-28: qty 17

## 2018-05-28 MED ORDER — SODIUM CHLORIDE 0.9 % IV SOLN
INTRAVENOUS | Status: DC
  Administered 2018-05-28: 12:00:00 via INTRAVENOUS
  Filled 2018-05-28: qty 250

## 2018-05-28 NOTE — Progress Notes (Signed)
Pt declined to stay 30 minute post infusion observation. Pt tolerated infusion well. Pt and VS stable at discharge.

## 2018-05-28 NOTE — Progress Notes (Signed)
Patient here for follow up today. He states that he is finally feeling better and seems to be getting his strength back. He denies having any pain. He reports having a poor appetite.

## 2018-05-29 LAB — CEA: CEA: 1.8 ng/mL (ref 0.0–4.7)

## 2018-07-26 ENCOUNTER — Inpatient Hospital Stay: Payer: Medicare Other | Attending: Oncology | Admitting: Genetics

## 2018-07-26 ENCOUNTER — Inpatient Hospital Stay: Payer: Medicare Other

## 2018-07-26 ENCOUNTER — Encounter: Payer: Self-pay | Admitting: Genetics

## 2018-07-26 DIAGNOSIS — C189 Malignant neoplasm of colon, unspecified: Secondary | ICD-10-CM

## 2018-07-26 DIAGNOSIS — Z1379 Encounter for other screening for genetic and chromosomal anomalies: Secondary | ICD-10-CM

## 2018-07-26 NOTE — Progress Notes (Signed)
REFERRING PROVIDER: Lloyd Huger, MD [address] [place], [place] [address]  PRIMARY PROVIDER:  Idelle Crouch, MD  PRIMARY REASON FOR VISIT:  1. Adenocarcinoma of colon ([place])   2. Malignant neoplasm of colon, unspecified part of colon ([place])     HISTORY OF PRESENT ILLNESS:   Luis Palmer, a 85 y.o. male, was seen for a [place] cancer genetics consultation at the request of Dr. Grayland Ormond due to a personal history of cancer.  Luis Palmer presents to clinic today to discuss the possibility of a hereditary predisposition to cancer, genetic testing, and to further clarify his future cancer risks, as well as potential cancer risks for family members.   In 2019, at the age of 55 colon cancer.  He had a partial colectomy of January 30, 2018.  Tumor testing revealed absent MLH1 and PMS2, MSI high.  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     CANCER HISTORY:    Adenocarcinoma of colon ([place])   01/25/2018 Initial Diagnosis    Adenocarcinoma of colon ([place])    02/25/2018 Cancer Staging    Staging form: Colon and Rectum, AJCC 8th Edition - Clinical stage from 02/25/2018: Stage IIA (cT3, cN0, cM0) - Signed by Lloyd Huger, MD on 02/25/2018      HORMONAL RISK FACTORS:  Patient has a hx of smoking.  He quit in his 20's.  Colonoscopy: yes; mabye a few polyps, hx of colon cancer. Patient reports having rectal exams performed by primary care provider.   Past Medical History:  Diagnosis Date  . AICD (automatic cardioverter/defibrillator) present 2006  . Anemia   . Atrial fibrillation ([place])   . CHF (congestive heart failure) ([place])   . CKD (chronic kidney disease), stage III ([place])   . Colon cancer ([place])    "dx'd 01/24/2018"  . High cholesterol   . History of blood transfusion 01/22/2018   "I lost blood; HgB extremely low"  . History of gout   . Myocardial infarction ([place]) 1986  . OSA on CPAP   . Pneumonia 2015  . Type II diabetes mellitus ([place])      Past Surgical History:  Procedure Laterality Date  . APPENDECTOMY    . ATRIAL FIBRILLATION ABLATION  X 2  . CARDIAC CATHETERIZATION    . CARDIAC DEFIBRILLATOR PLACEMENT  2006   "added lead and had battery changed once since the initial OR" (01/29/2018)  . CATARACT EXTRACTION W/ INTRAOCULAR LENS  IMPLANT, BILATERAL Bilateral   . COLONOSCOPY WITH PROPOFOL N/A 01/23/2018   Procedure: COLONOSCOPY WITH PROPOFOL;  Surgeon: Lucilla Lame, MD;  [facility];  Service: Endoscopy;  Laterality: N/A;  . CORONARY ARTERY BYPASS GRAFT  1986   "CABG X2"  . CYSTOSCOPY N/A 01/30/2018   Procedure: CYSTOSCOPY FLEXIBLE WITH INSERTION OF CATHETER;  Surgeon: Jovita Kussmaul, MD;  Location: [place];  Service: General;  Laterality: N/A;  . ESOPHAGOGASTRODUODENOSCOPY (EGD) WITH PROPOFOL N/A 01/22/2018   Procedure: ESOPHAGOGASTRODUODENOSCOPY (EGD) WITH PROPOFOL;  Surgeon: Lucilla Lame, MD;  Location: [facility] ENDOSCOPY;  Service: Endoscopy;  Laterality: N/A;  . INSERT / REPLACE / REMOVE PACEMAKER    . LAPAROSCOPIC PARTIAL COLECTOMY N/A 01/30/2018   Procedure: LAPAROSCOPIC ASSISTED EXTENDED RIGHT COLECTOMY;  Surgeon: Jovita Kussmaul, MD;  Location: [place];  Service: General;  Laterality: N/A;  . NASAL SINUS SURGERY      Social History   Socioeconomic History  . Marital status: Married    Spouse name: Lelon Frohlich  . Number of  children: 4  . Years of education: Not on file  . Highest education level: Not on file  Occupational History  . Not on file  Social Needs  . Financial resource strain: Not on file  . Food insecurity:    Worry: Not on file    Inability: Not on file  . Transportation needs:    Medical: Not on file    Non-medical: Not on file  Tobacco Use  . Smoking status: Former Smoker    Packs/day: 1.00    Years: 40.00    Pack years: 40.00    Types: Cigarettes    Last attempt to quit: 1986    Years since quitting: 34.1  . Smokeless tobacco: Never Used  Substance and Sexual Activity  . Alcohol use:  Not Currently  . Drug use: Never  . Sexual activity: Not on file  Lifestyle  . Physical activity:    Days per week: Not on file    Minutes per session: Not on file  . Stress: Not on file  Relationships  . Social connections:    Talks on phone: Not on file    Gets together: Not on file    Attends religious service: Not on file    Active member of club or organization: Not on file    Attends meetings of clubs or organizations: Not on file    Relationship status: Not on file  Other Topics Concern  . Not on file  Social History Narrative  . Not on file     FAMILY HISTORY:  We obtained a detailed, 4-generation family history.  Significant diagnoses are listed below: Family History  Problem Relation Age of Onset  . Congestive Heart Failure Mother   . Diabetes Father   . Stroke Father   . Lung cancer Brother 74  . Skin cancer Brother        squamous cell  . Heart attack Maternal Uncle    Luis Palmer has a daughter with his current wife who is 69.  He has 2 sons and a daughter from a previous marriage (13's/60's) with no hx of cancer.  He has 5 biological grandchildren. Luis Palmer has 1 sister who is deceased at 67 and 2 brothers. 1 had lung cancer dx at 71 (deceased), and 1 brother living is 55 with a few squamous cell skin cancers.   No nieces or nephews with any cancer.   Luis Palmer father: died at 47 with no hx of cancer.  Paternal Aunts/Uncles: 2 paternal aunts and 4 paternal uncles, 2 paternal half brothers (through grandfather) with no known hx of cancer.  Paternal cousins: no info Paternal grandfather: limited info, no known cancer Paternal grandmother:limited info, no known cancer.   Luis Palmer mother: died at 13 with no hx of cancer.  Maternal Aunts/Uncles: 2 maternal aunts and 1 maternal uncles.  No known hx of acncer Maternal cousins: no known hx of cancer.  Maternal grandfather: no hx cancer, died in his 70's. Maternal grandmother:no hx cancer, died in her 53's.    Luis Palmer is unaware of previous family history of genetic testing for hereditary cancer risks. Patient's maternal ancestors are of N. European descent, and paternal ancestors are of N. European descent. There is no reported Ashkenazi Jewish ancestry. There is no known consanguinity.  GENETIC COUNSELING ASSESSMENT: Luis Palmer is a 85 y.o. male with a personal history which is somewhat suggestive of a Hereditary Cancer Predisposition Syndrome. We, therefore, discussed and recommended the following at  today's visit.   DISCUSSION: We reviewed the characteristics, features and inheritance patterns of hereditary cancer syndromes. We also discussed genetic testing, including the appropriate family members to test, the process of testing, insurance coverage and turn-around-time for results. We discussed the implications of a negative, positive and/or variant of uncertain significant result. We offered Luis Palmer the option to pursue genetic testing.    We discussed that only 5-10% of cancers are associated with a Hereditary Cancer Predisposition Syndrome.  The most common hereditary cancer syndrome associated with colon cancer is Lynch Syndrome.  Lynch Syndrome is caused by mutations in the genes: MLH1, MSH2, MSH6, PMS2 and EPCAM.  This syndrome increases the risk for colon, uterine, ovarian and stomach cancers, as well as others.  Families with Lynch Syndrome tend to have multiple family members with these cancers, typically diagnosed under age 56, and diagnoses in multiple generations.    We discussed that there are several other genes that are associated with an increased risk for colon cancer and increased polyp burden (MUTYH, APC, POLE, CHEK2, etc.) We also dicussed that there are many genes that cause many different types of cancer risks.    Testing on Luis Palmer's tumor revealed abnormal IHC- Absent MLH1 and PMS2, MSI-High, BRAF V600E mutation present.   We discussed that if he is found to have  a mutation in one of these genes, it may impact future medical management recommendations such as increased cancer screenings and consideration of risk reducing surgeries.  A positive result could also have implications for the patient's family members.  A Negative result would mean we were unable to identify a hereditary component to her cancer, but does not rule out the possibility of a hereditary basis for her cancer.  There could be mutations that are undetectable by current technology, or in genes not yet tested or identified to increase cancer risk.    We discussed the potential to find a Variant of Uncertain Significance or VUS.  These are variants that have not yet been identified as pathogenic or benign, and it is unknown if this variant is associated with increased cancer risk or if this is a normal finding.  Most VUS's are reclassified to benign or likely benign.   It should not be used to make medical management decisions. With time, we suspect the lab will determine the significance of any VUS's identified if any.   We discussed with Luis Palmer that the family history is not highly consistent with a familial hereditary cancer syndrome, and we feel he is at low risk to harbor a gene mutation associated with such a condition. However, he reports not knowing a lot about his family's health history.  We still offered him the option to pursue genetic testing.  He elected to proceed with genetic testing for the Common Hereditary Cancers Panel.   The Common Hereditary Cancer Panel offered by [facility] includes sequencing and/or deletion duplication testing of the following 53 genes: APC, ATM, AXIN2, BARD1, BMPR1A, BRCA1, BRCA2, BRIP1, BUB1B, CDH1, CDK4, CDKN2A, CHEK2, CTNNA1, DICER1, ENG, EPCAM, GALNT12, GREM1, HOXB13, KIT, MEN1, MLH1, MLH3, MSH2, MSH3, MSH6, MUTYH, NBN, NF1, NTHL1, PALB2, PDGFRA, PMS2, POLD1, POLE, PTEN, RAD50, RAD51C, RAD51D, RNF43, RPS20, SDHA, SDHB, SDHC, SDHD, SMAD4, SMARCA4, STK11,  TP53, TSC1, TSC2, VHL  PLAN: After considering the risks, benefits, and limitations, Luis Palmer  provided informed consent to pursue genetic testing and the blood sample was sent to [facility] for analysis of the Common Hereditary Cancers Panel. Results should be  available within approximately 2-3 weeks' time, at which point they will be disclosed by telephone to Luis Palmer, as will any additional recommendations warranted by these results. Luis Palmer will receive a summary of his genetic counseling visit and a copy of his results once available. This information will also be available in Epic. We encouraged Luis Palmer to remain in contact with cancer genetics annually so that we can continuously update the family history and inform him of any changes in cancer genetics and testing that may be of benefit for his family. Luis Palmer questions were answered to his satisfaction today. Our contact information was provided should additional questions or concerns arise.  Lastly, we encouraged Luis Palmer to remain in contact with cancer genetics annually so that we can continuously update the family history and inform him of any changes in cancer genetics and testing that may be of benefit for this family.   Mr.  Palmer questions were answered to his satisfaction today. Our contact information was provided should additional questions or concerns arise. Thank you for the referral and allowing [place] to share in the care of your patient.   Tana Felts, MS, [facility] Genetic Counselor .[email or web address] phone: [phone]  The patient was seen for a total of 40 minutes in face-to-face genetic counseling. The patient was accompanied today by his wife.  Dr. Grayland Ormond was available for questions regarding this case.

## 2018-08-03 ENCOUNTER — Encounter: Payer: Self-pay | Admitting: Licensed Clinical Social Worker

## 2018-08-03 ENCOUNTER — Ambulatory Visit: Payer: Self-pay | Admitting: Licensed Clinical Social Worker

## 2018-08-03 ENCOUNTER — Telehealth: Payer: Self-pay | Admitting: Licensed Clinical Social Worker

## 2018-08-03 DIAGNOSIS — Z1379 Encounter for other screening for genetic and chromosomal anomalies: Secondary | ICD-10-CM | POA: Insufficient documentation

## 2018-08-03 NOTE — Telephone Encounter (Signed)
Revealed negative genetic testing.  Revealed that a VUS in BARD1 was identified. This normal result is reassuring and indicates that it is unlikely Luis Palmer's cancer is due to a hereditary cause.  It is unlikely that there is an increased risk of another cancer due to a mutation in one of these genes.  However, genetic testing is not perfect, and cannot definitively rule out a hereditary cause.  It will be important for him to keep in contact with genetics to learn if any additional testing may be needed in the future.

## 2018-08-03 NOTE — Progress Notes (Signed)
HPI:  Mr. Vanessen was previously seen in the [facility] clinic due to a personal and family  history of cancer and concerns regarding a hereditary predisposition to cancer. Please refer to our prior cancer genetics clinic note for more information regarding our discussion assessment and recommendations, at the time. Mr. Prats recent genetic test results were disclosed to him, as well as recommendations warranted by these results. These results and recommendations are discussed in more detail below.  In 2019, at the age of 85 colon cancer.  He had a partial colectomy of January 30, 2018.  Tumor testing revealed absent MLH1 and PMS2, MSI high.  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     CANCER HISTORY:    Adenocarcinoma of colon ([place])   01/25/2018 Initial Diagnosis    Adenocarcinoma of colon ([place])    02/25/2018 Cancer Staging    Staging form: Colon and Rectum, AJCC 8th Edition - Clinical stage from 02/25/2018: Stage IIA (cT3, cN0, cM0) - Signed by Lloyd Huger, MD on 02/25/2018      FAMILY HISTORY:  We obtained a detailed, 4-generation family history.  Significant diagnoses are listed below: Family History  Problem Relation Age of Onset  . Congestive Heart Failure Mother   . Diabetes Father   . Stroke Father   . Lung cancer Brother 68  . Skin cancer Brother        squamous cell  . Heart attack Maternal Uncle    Mr. Nack has a daughter with his current wife who is 85.  He has 2 sons and a daughter from a previous marriage (55's/60's) with no hx of cancer.  He has 5 biological grandchildren. Mr. Paluch has 1 sister who is deceased at 84 and 2 brothers. 1 had lung cancer dx at 10 (deceased), and 1 brother living is 40 with a few squamous cell skin cancers.   No nieces or nephews with any cancer.   Mr. Masterson father: died at 32 with no hx of cancer.  Paternal Aunts/Uncles: 2 paternal aunts and 4 paternal uncles, 2 paternal half brothers (through  grandfather) with no known hx of cancer.  Paternal cousins: no info Paternal grandfather: limited info, no known cancer Paternal grandmother:limited info, no known cancer.   Mr. Bram mother: died at 46 with no hx of cancer.  Maternal Aunts/Uncles: 2 maternal aunts and 1 maternal uncles.  No known hx of acncer Maternal cousins: no known hx of cancer.  Maternal grandfather: no hx cancer, died in his 20's. Maternal grandmother:no hx cancer, died in her 7's.   Mr. Yielding is unaware of previous family history of genetic testing for hereditary cancer risks. Patient's maternal ancestors are of N. European descent, and paternal ancestors are of N. European descent. There is no reported Ashkenazi Jewish ancestry. There is no known consanguinity.  GENETIC TEST RESULTS: Genetic testing performed through [facility]'s Common Hereditary Cancers Panel reported out on 08/02/2018 showed no pathogenic mutations.   The Common Hereditary Cancers Panel offered by [facility] includes sequencing and/or deletion duplication testing of the following 47 genes: APC, ATM, AXIN2, BARD1, BMPR1A, BRCA1, BRCA2, BRIP1, CDH1, CDKN2A (p14ARF), CDKN2A (p16INK4a), CKD4, CHEK2, CTNNA1, DICER1, EPCAM (Deletion/duplication testing only), GREM1 (promoter region deletion/duplication testing only), KIT, MEN1, MLH1, MSH2, MSH3, MSH6, MUTYH, NBN, NF1, NHTL1, PALB2, PDGFRA, PMS2, POLD1, POLE, PTEN, RAD50, RAD51C, RAD51D, SDHB, SDHC, SDHD, SMAD4, SMARCA4. STK11, TP53, TSC1, TSC2, and VHL.  The following genes were evaluated for sequence changes only: SDHA and  HOXB13 c.251G>A variant only..  A variant of uncertain significance (VUS) in a gene called BARD1 was also noted.   The test report will be scanned into EPIC and will be located under the Molecular Pathology section of the Results Review tab. A portion of the result report is included below for reference.     We discussed with Mr. Chatterjee that because current genetic testing is not perfect,  it is possible there may be a gene mutation in one of these genes that current testing cannot detect, but that chance is small.  We also discussed, that there could be another gene that has not yet been discovered, or that we have not yet tested, that is responsible for the cancer diagnoses in the family. It is also possible there is a hereditary cause for the cancer in the family that Mr. Galen did not inherit and therefore was not identified in his testing.  Therefore, it is important to remain in touch with cancer genetics in the future so that we can continue to offer Mr. Sawin the most up to date genetic testing.   Regarding the VUS in BARD1: At this time, it is unknown if this variant is associated with increased cancer risk or if this is a normal finding, but most variants such as this get reclassified to being inconsequential. It should not be used to make medical management decisions. With time, we suspect the lab will determine the significance of this variant, if any. If we do learn more about it, we will try to contact Mr. Cogle to discuss it further. However, it is important to stay in touch with [place] periodically and keep the address and phone number up to date.  ADDITIONAL GENETIC TESTING: We discussed with Mr. Mattie that his genetic testing was fairly extensive.  If there are are genes identified to increase cancer risk that can be analyzed in the future, we would be happy to discuss and coordinate this testing at that time.    CANCER SCREENING RECOMMENDATIONS: Mr. Yandow test result is considered negative (normal).  This means that we have not identified a hereditary cause for his personal and family history of cancer at this time. Most cancers happen by chance and this negative test suggests that his cancer may fall into this category.    While reassuring, this does not definitively rule out a hereditary predisposition to cancer. It is still possible that there could be genetic mutations  that are undetectable by current technology, or genetic mutations in genes that have not been tested or identified to increase cancer risk.  Therefore, it is recommended he continue to follow the cancer management and screening guidelines provided by his oncology and primary healthcare provider. An individual's cancer risk is not determined by genetic test results alone.  Overall cancer risk assessment includes additional factors such as personal medical history, family history, etc.  These should be used to make a personalized plan for cancer prevention and surveillance.    RECOMMENDATIONS FOR FAMILY MEMBERS:  Relatives in this family might be at some increased risk of developing cancer, over the general population risk, simply due to the family history of cancer.  We recommended women in this family have a yearly mammogram beginning at age 37, or 43 years younger than the earliest onset of cancer, an annual clinical breast exam, and perform monthly breast self-exams. Women in this family should also have a gynecological exam as recommended by their primary provider. All family members should have a  colonoscopy as directed by their doctors.  All family members should inform their physicians about the family history of cancer so their doctors can make the most appropriate screening recommendations for them.   FOLLOW-UP: Lastly, we discussed with Mr. Chaves that cancer genetics is a rapidly advancing field and it is possible that new genetic tests will be appropriate for him and/or his family members in the future. We encouraged him to remain in contact with cancer genetics on an annual basis so we can update his personal and family histories and let him know of advances in cancer genetics that may benefit this family.   Our contact number was provided. Mr. Welliver questions were answered to his satisfaction, and he knows he is welcome to call us at anytime with additional questions or concerns.  Faith Rogue, MS Genetic Counselor [email or web address] Phone: [phone]

## 2018-08-15 ENCOUNTER — Other Ambulatory Visit: Payer: Self-pay | Admitting: *Deleted

## 2018-08-15 DIAGNOSIS — D509 Iron deficiency anemia, unspecified: Secondary | ICD-10-CM

## 2018-08-15 NOTE — Progress Notes (Signed)
bc

## 2018-08-21 ENCOUNTER — Ambulatory Visit: Admission: RE | Admit: 2018-08-21 | Payer: Medicare Other | Source: Ambulatory Visit

## 2018-08-21 ENCOUNTER — Inpatient Hospital Stay: Payer: Medicare Other

## 2018-08-24 ENCOUNTER — Inpatient Hospital Stay: Payer: Medicare Other

## 2018-08-24 ENCOUNTER — Inpatient Hospital Stay: Payer: Medicare Other | Admitting: Oncology

## 2018-09-21 ENCOUNTER — Other Ambulatory Visit: Payer: Self-pay

## 2018-09-21 ENCOUNTER — Ambulatory Visit
Admission: RE | Admit: 2018-09-21 | Discharge: 2018-09-21 | Disposition: A | Discharge status: Home / Self Care | Payer: Medicare Other | Source: Ambulatory Visit | Attending: Oncology | Admitting: Oncology

## 2018-09-21 ENCOUNTER — Inpatient Hospital Stay: Payer: Medicare Other | Attending: Oncology

## 2018-09-21 DIAGNOSIS — C189 Malignant neoplasm of colon, unspecified: Secondary | ICD-10-CM | POA: Diagnosis not present

## 2018-09-21 DIAGNOSIS — D509 Iron deficiency anemia, unspecified: Secondary | ICD-10-CM

## 2018-09-21 DIAGNOSIS — Z9049 Acquired absence of other specified parts of digestive tract: Secondary | ICD-10-CM | POA: Diagnosis not present

## 2018-09-21 DIAGNOSIS — C182 Malignant neoplasm of ascending colon: Secondary | ICD-10-CM | POA: Diagnosis present

## 2018-09-21 DIAGNOSIS — Z87891 Personal history of nicotine dependence: Secondary | ICD-10-CM | POA: Insufficient documentation

## 2018-09-21 DIAGNOSIS — Z95810 Presence of automatic (implantable) cardiac defibrillator: Secondary | ICD-10-CM | POA: Diagnosis not present

## 2018-09-21 DIAGNOSIS — M109 Gout, unspecified: Secondary | ICD-10-CM | POA: Insufficient documentation

## 2018-09-21 DIAGNOSIS — E7800 Pure hypercholesterolemia, unspecified: Secondary | ICD-10-CM | POA: Insufficient documentation

## 2018-09-21 DIAGNOSIS — I252 Old myocardial infarction: Secondary | ICD-10-CM | POA: Diagnosis not present

## 2018-09-21 DIAGNOSIS — E1122 Type 2 diabetes mellitus with diabetic chronic kidney disease: Secondary | ICD-10-CM | POA: Insufficient documentation

## 2018-09-21 DIAGNOSIS — Z79899 Other long term (current) drug therapy: Secondary | ICD-10-CM | POA: Insufficient documentation

## 2018-09-21 DIAGNOSIS — N400 Enlarged prostate without lower urinary tract symptoms: Secondary | ICD-10-CM | POA: Insufficient documentation

## 2018-09-21 DIAGNOSIS — Z7984 Long term (current) use of oral hypoglycemic drugs: Secondary | ICD-10-CM | POA: Diagnosis not present

## 2018-09-21 DIAGNOSIS — G4733 Obstructive sleep apnea (adult) (pediatric): Secondary | ICD-10-CM | POA: Insufficient documentation

## 2018-09-21 DIAGNOSIS — Z7901 Long term (current) use of anticoagulants: Secondary | ICD-10-CM | POA: Insufficient documentation

## 2018-09-21 DIAGNOSIS — I4891 Unspecified atrial fibrillation: Secondary | ICD-10-CM | POA: Diagnosis not present

## 2018-09-21 DIAGNOSIS — N183 Chronic kidney disease, stage 3 (moderate): Secondary | ICD-10-CM | POA: Diagnosis not present

## 2018-09-21 LAB — CBC WITH DIFFERENTIAL/PLATELET
Abs Immature Granulocytes: 0.02 10*3/uL (ref 0.00–0.07)
Basophils Absolute: 0.1 10*3/uL (ref 0.0–0.1)
Basophils Relative: 1 %
Eosinophils Absolute: 0.8 10*3/uL — ABNORMAL HIGH (ref 0.0–0.5)
Eosinophils Relative: 16 %
HCT: 31.6 % — ABNORMAL LOW (ref 39.0–52.0)
Hemoglobin: 9.8 g/dL — ABNORMAL LOW (ref 13.0–17.0)
Immature Granulocytes: 0 %
Lymphocytes Relative: 25 %
Lymphs Abs: 1.3 10*3/uL (ref 0.7–4.0)
MCH: 27.6 pg (ref 26.0–34.0)
MCHC: 31 g/dL (ref 30.0–36.0)
MCV: 89 fL (ref 80.0–100.0)
Monocytes Absolute: 1 10*3/uL (ref 0.1–1.0)
Monocytes Relative: 19 %
Neutro Abs: 2.1 10*3/uL (ref 1.7–7.7)
Neutrophils Relative %: 39 %
Platelets: 178 10*3/uL (ref 150–400)
RBC: 3.55 MIL/uL — ABNORMAL LOW (ref 4.22–5.81)
RDW: 16.3 % — ABNORMAL HIGH (ref 11.5–15.5)
WBC: 5.4 10*3/uL (ref 4.0–10.5)
nRBC: 0 % (ref 0.0–0.2)

## 2018-09-21 LAB — IRON AND TIBC
Iron: 21 ug/dL — ABNORMAL LOW (ref 45–182)
Saturation Ratios: 8 % — ABNORMAL LOW (ref 17.9–39.5)
TIBC: 281 ug/dL (ref 250–450)
UIBC: 260 ug/dL

## 2018-09-21 LAB — POCT I-STAT CREATININE: Creatinine, Ser: 1.4 mg/dL — ABNORMAL HIGH (ref 0.61–1.24)

## 2018-09-21 LAB — FERRITIN: Ferritin: 264 ng/mL (ref 24–336)

## 2018-09-21 MED ORDER — IOHEXOL 300 MG/ML  SOLN
75.0000 mL | Freq: Once | INTRAMUSCULAR | Status: AC | PRN
  Administered 2018-09-21: 75 mL via INTRAVENOUS

## 2018-09-22 LAB — CEA: CEA: 1.8 ng/mL (ref 0.0–4.7)

## 2018-09-24 NOTE — Progress Notes (Signed)
[facility]  [phone] Fax:(336) [phone]  ID: Luis Palmer OB: December 25, 1932  MR#: [id number]  OBS#:[id number]  Patient Care Team: Idelle Crouch, MD as PCP - General (Internal Medicine)  I connected with Suella Grove on 09/26/18 at 11:00 AM EDT by video enabled telemedicine visit and verified that I am speaking with the correct person using two identifiers.   I discussed the limitations, risks, security and privacy concerns of performing an evaluation and management service by telemedicine and the availability of in-person appointments. I also discussed with the patient that there may be a patient responsible charge related to this service. The patient expressed understanding and agreed to proceed.   Other persons participating in the visit and their role in the encounter: Patient, nursing.   Patients location: Home  Providers location: Clinic   CHIEF COMPLAINT: Stage IIa adenocarcinoma of the right colon.  INTERVAL HISTORY: Patient agreed to video enabled telemedicine visit to discuss his imaging and laboratory work.  Patient had technical difficulties partway through therefore, the appointment was completed by telephone.  He has chronic weakness and fatigue, but otherwise feels well.  He has no neurologic complaints.  He denies any recent fevers or illnesses.  He has a good appetite and denies weight loss.  He denies any chest pain, shortness of breath, cough, or hemoptysis.  He denies any nausea, vomiting, constipation, or diarrhea.  He has no melena or hematochezia.  He has noted no changes in his bowel movements.  He has no urinary complaints.  Patient feels at his baseline offers no further specific complaints today.  REVIEW OF SYSTEMS:   Review of Systems  Constitutional: Positive for malaise/fatigue. Negative for fever and weight loss.  Respiratory: Negative.  Negative for cough, hemoptysis and shortness of breath.   Cardiovascular:  Negative.  Negative for chest pain and leg swelling.  Gastrointestinal: Negative.  Negative for abdominal pain, blood in stool and melena.  Genitourinary: Negative.  Negative for hematuria.  Musculoskeletal: Negative.  Negative for myalgias.  Skin: Negative.  Negative for rash.  Neurological: Positive for weakness. Negative for dizziness, sensory change and focal weakness.  Psychiatric/Behavioral: Negative.  The patient is not nervous/anxious.     As per HPI. Otherwise, a complete review of systems is negative.  PAST MEDICAL HISTORY: Past Medical History:  Diagnosis Date   AICD (automatic cardioverter/defibrillator) present 2006   Anemia    Atrial fibrillation ([facility])    CHF (congestive heart failure) ([place])    CKD (chronic kidney disease), stage III ([place])    Colon cancer ([place])    "dx'd 01/24/2018"   High cholesterol    History of blood transfusion 01/22/2018   "I lost blood; HgB extremely low"   History of gout    Myocardial infarction ([place]) 1986   OSA on CPAP    Pneumonia 2015   Type II diabetes mellitus ([place])     PAST SURGICAL HISTORY: Past Surgical History:  Procedure Laterality Date   APPENDECTOMY     ATRIAL FIBRILLATION ABLATION  X 2   CARDIAC CATHETERIZATION     CARDIAC DEFIBRILLATOR PLACEMENT  2006   "added lead and had battery changed once since the initial OR" (01/29/2018)   CATARACT EXTRACTION W/ INTRAOCULAR LENS  IMPLANT, BILATERAL Bilateral    COLONOSCOPY WITH PROPOFOL N/A 01/23/2018   Procedure: COLONOSCOPY WITH PROPOFOL;  Surgeon: Lucilla Lame, MD;  Location: [facility] ENDOSCOPY;  Service: Endoscopy;  Laterality: N/A;   CORONARY ARTERY BYPASS GRAFT  1986   "  CABG X2"   CYSTOSCOPY N/A 01/30/2018   Procedure: CYSTOSCOPY FLEXIBLE WITH INSERTION OF CATHETER;  Surgeon: Jovita Kussmaul, MD;  Location: [facility];  Service: General;  Laterality: N/A;   ESOPHAGOGASTRODUODENOSCOPY (EGD) WITH PROPOFOL N/A 01/22/2018   Procedure: ESOPHAGOGASTRODUODENOSCOPY (EGD)  WITH PROPOFOL;  Surgeon: Lucilla Lame, MD;  Location: [facility] ENDOSCOPY;  Service: Endoscopy;  Laterality: N/A;   INSERT / REPLACE / REMOVE PACEMAKER     LAPAROSCOPIC PARTIAL COLECTOMY N/A 01/30/2018   Procedure: LAPAROSCOPIC ASSISTED EXTENDED RIGHT COLECTOMY;  Surgeon: Jovita Kussmaul, MD;  Location: [facility] OR;  Service: General;  Laterality: N/A;   NASAL SINUS SURGERY      FAMILY HISTORY: Family History  Problem Relation Age of Onset   Congestive Heart Failure Mother    Diabetes Father    Stroke Father    Lung cancer Brother 110   Skin cancer Brother        squamous cell   Heart attack Maternal Uncle     ADVANCED DIRECTIVES (Y/N):  N  HEALTH MAINTENANCE: Social History   Tobacco Use   Smoking status: Former Smoker    Packs/day: 1.00    Years: 40.00    Pack years: 40.00    Types: Cigarettes    Last attempt to quit: 1986    Years since quitting: 34.3   Smokeless tobacco: Never Used  Substance Use Topics   Alcohol use: Not Currently   Drug use: Never     Colonoscopy:  PAP:  Bone density:  Lipid panel:  Allergies  Allergen Reactions   Pseudoephedrine Hcl Other (See Comments)    Other Reaction: ANURIA   Ace Inhibitors Cough and Other (See Comments)    cough cough    Nsaids Other (See Comments)    Reaction:  Fluid retention    Vasotec [Enalapril] Cough    Current Outpatient Medications  Medication Sig Dispense Refill   acetaminophen (TYLENOL) 500 MG tablet Take 2 tablets (1,000 mg total) by mouth every 8 (eight) hours. 30 tablet 0   apixaban (ELIQUIS) 5 MG TABS tablet Take 5 mg by mouth 2 (two) times daily.     carvedilol (COREG) 12.5 MG tablet Take 12.5 mg by mouth 2 (two) times daily.     Cholecalciferol (VITAMIN D) 2000 UNITS CAPS Take 1 capsule by mouth daily.     donepezil (ARICEPT) 10 MG tablet Take 10 mg by mouth at bedtime.     ezetimibe (ZETIA) 10 MG tablet Take 10 mg by mouth at bedtime.     furosemide (LASIX) 40 MG tablet Take 40 mg  by mouth daily.     Iodoquinol-HC-Aloe Polysacch (ALCORTIN A) 1-2-1 % GEL Apply 1 application topically as needed for dry skin.  2   metFORMIN (GLUCOPHAGE) 500 MG tablet Take 1 tablet by mouth 2 (two) times daily.     rosuvastatin (CRESTOR) 10 MG tablet Take 10 mg by mouth daily.     sacubitril-valsartan (ENTRESTO) 24-26 MG Take 1 tablet by mouth 2 (two) times daily.     tamsulosin (FLOMAX) 0.4 MG CAPS capsule Take 1 capsule (0.4 mg total) by mouth at bedtime. 30 capsule 0   Multiple Vitamin (MULTIVITAMIN WITH MINERALS) TABS tablet Take 1 tablet by mouth daily. (Patient not taking: Reported on 09/26/2018) 30 tablet 0   polyethylene glycol (MIRALAX / GLYCOLAX) packet Take 17 g by mouth daily. (Patient not taking: Reported on 09/26/2018) 14 each 0   No current facility-administered medications for this visit.     OBJECTIVE: There  were no vitals filed for this visit.   There is no height or weight on file to calculate BMI.    ECOG FS:0 - Asymptomatic  General: Well-developed, well-nourished, no acute distress. HEENT: Normocephalic. Neuro: Alert, answering all questions appropriately. Cranial nerves grossly intact. Skin: No rashes or petechiae noted. Psych: Normal affect.  LAB RESULTS:  Lab Results  Component Value Date   NA 142 02/12/2018   K 4.4 02/12/2018   CL 108 02/12/2018   CO2 25 02/12/2018   GLUCOSE 100 (H) 02/12/2018   BUN 11 02/12/2018   CREATININE 1.40 (H) 09/21/2018   CALCIUM 9.1 02/12/2018   PROT 5.8 (L) 02/01/2018   ALBUMIN 2.9 (L) 02/01/2018   AST 10 (L) 02/01/2018   ALT 11 02/01/2018   ALKPHOS 51 02/01/2018   BILITOT 1.0 02/01/2018   GFRNONAA 48 (L) 02/12/2018   GFRAA 56 (L) 02/12/2018    Lab Results  Component Value Date   WBC 5.4 09/21/2018   NEUTROABS 2.1 09/21/2018   HGB 9.8 (L) 09/21/2018   HCT 31.6 (L) 09/21/2018   MCV 89.0 09/21/2018   PLT 178 09/21/2018   Lab Results  Component Value Date   IRON 21 (L) 09/21/2018   TIBC 281 09/21/2018    IRONPCTSAT 8 (L) 09/21/2018   Lab Results  Component Value Date   FERRITIN 264 09/21/2018      STUDIES: Ct Abdomen Pelvis W Contrast  Result Date: 09/21/2018 CLINICAL DATA:  Stage IIA right colonic adenocarcinoma status post right hemicolectomy 01/30/2018. Interval active surveillance. Restaging. EXAM: CT ABDOMEN AND PELVIS WITH CONTRAST TECHNIQUE: Multidetector CT imaging of the abdomen and pelvis was performed using the standard protocol following bolus administration of intravenous contrast. CONTRAST:  45m OMNIPAQUE IOHEXOL 300 MG/ML  SOLN COMPARISON:  01/23/2018 CT chest, abdomen and pelvis. FINDINGS: Lower chest: Small dependent bilateral pleural effusions, unchanged from prior. Mild cardiomegaly. Tips of ICD leads are seen in the right atrium, right ventricular apex and coronary sinus. Patchy ground-glass centrilobular micronodularity throughout both lung bases with associated mild interlobular septal thickening, mildly worsened from prior. Hepatobiliary: Normal liver size. No liver mass. Normal gallbladder with no radiopaque cholelithiasis. No biliary ductal dilatation. Pancreas: Normal, with no mass or duct dilation. Spleen: Stable granulomatous splenic calcifications. Normal size spleen. No splenic mass. Adrenals/Urinary Tract: No discrete adrenal nodules. Simple 2.3 cm lower right renal cyst. No hydronephrosis. Nondistended bladder with stable chronic mild diffuse bladder wall thickening. Stomach/Bowel: Normal non-distended stomach. Postsurgical changes from right hemicolectomy with ileocolic anastomosis in the anterior mid abdomen. No small bowel dilatation or small bowel wall thickening. No mass or wall thickening at ileocolic anastomosis. Oral contrast transits to the rectum. Remnant large bowel demonstrates no wall thickening, diverticulosis or significant pericolonic fat stranding. Vascular/Lymphatic: Atherosclerotic nonaneurysmal abdominal aorta. Patent portal, splenic, hepatic and renal  veins. Contrast reflux into the hepatic veins. No pathologically enlarged lymph nodes in the abdomen or pelvis. Reproductive: Moderate prostatomegaly, stable. Other: No pneumoperitoneum. No focal fluid collection. Trace perihepatic ascites. Musculoskeletal: No aggressive appearing focal osseous lesions. 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 Moderate thoracolumbar spondylosis. IMPRESSION: 1. No evidence of anastomotic recurrence or metastatic disease in the abdomen or pelvis. 2. Cardiomegaly. Small dependent bilateral pleural effusions. Patchy ground-glass centrilobular micronodularity and mild interlobular septal thickening at the lung bases, favor mild pulmonary edema. Contrast reflux into the hepatic veins suggests right heart failure. 3. Trace perihepatic ascites. 4. Aortic Atherosclerosis (ICD10-I70.0). Several additional chronic findings as detailed. Electronically Signed   By: JCorene Cornea  A Poff M.D.   On: 09/21/2018 16:32    ASSESSMENT: Stage IIa adenocarcinoma of the right colon.  PLAN:   1. Stage IIa adenocarcinoma of the right colon: Patient underwent partial colectomy on January 30, 2018 revealing the above-stated malignancy.  0 of 27 lymph nodes were negative for disease.  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  He was also noted to be MSI high.  Patient's with MSI high may have a better prognosis and typically do not benefit from adjuvant 5-FU therapy.  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  Patient's preop CEA was elevated at 8.3, his most recent result continues to be within normal limits at 1.8.  CT scan results from September 21, 2018 revealed no evidence of recurrent or progressive disease.  Patient will require a surveillance colonoscopy at the end of 2020 once we are through the COVID-19 pandemic.  Return to clinic in 3 months with repeat laboratory work and further evaluation.   2.  Iron deficiency anemia: Patient's  hemoglobin is stable at 9.8.  His iron stores remain mildly decreased.  Will hold on additional IV Feraheme at this point given the COVID-19 pandemic, but patient will likely benefit from another infusion in the future.  Return to clinic as above.   3.  Thrombocytopenia: Resolved.  I provided 20 minutes of face-to-face video visit time during this encounter, and > 50% was spent counseling as documented under my assessment & plan.   Patient expressed understanding and was in agreement with this plan. He also understands that He can call clinic at any time with any questions, concerns, or complaints.    Lloyd Huger, MD   09/26/2018 1:37 PM

## 2018-09-25 ENCOUNTER — Other Ambulatory Visit: Payer: Self-pay

## 2018-09-26 ENCOUNTER — Inpatient Hospital Stay (HOSPITAL_BASED_OUTPATIENT_CLINIC_OR_DEPARTMENT_OTHER): Payer: Medicare Other | Admitting: Oncology

## 2018-09-26 ENCOUNTER — Other Ambulatory Visit: Payer: Self-pay

## 2018-09-26 ENCOUNTER — Encounter: Payer: Self-pay | Admitting: Oncology

## 2018-09-26 ENCOUNTER — Ambulatory Visit: Payer: Medicare Other

## 2018-09-26 ENCOUNTER — Inpatient Hospital Stay: Payer: Medicare Other

## 2018-09-26 DIAGNOSIS — C189 Malignant neoplasm of colon, unspecified: Secondary | ICD-10-CM | POA: Diagnosis not present

## 2018-09-26 NOTE — Progress Notes (Signed)
Patient had been doing well with no complaints. Patient would like to know his CT Scan results.

## 2019-01-05 NOTE — Progress Notes (Signed)
[place]  Telephone:(336) [phone] Fax:(336) [phone]  ID: Luis Palmer OB: 06-09-1932  MR#: [id number]  MIW#:[id number]  Patient Care Team: Idelle Crouch, MD as PCP - General (Internal Medicine)   CHIEF COMPLAINT: Stage IIa adenocarcinoma of the right colon.  INTERVAL HISTORY: Patient returns to clinic today for laboratory, further evaluation, and consideration of IV Feraheme. He continues to have chronic weakness and fatigue, but otherwise feels well.  He has no neurologic complaints.  He denies any recent fevers or illnesses.  He has a good appetite and denies weight loss.  He denies any chest pain, shortness of breath, cough, or hemoptysis.  He denies any nausea, vomiting, constipation, or diarrhea.  He has no melena or hematochezia.  He has noted no changes in his bowel movements.  He has no urinary complaints.  Patient offers no further specific complaints today.  REVIEW OF SYSTEMS:   Review of Systems  Constitutional: Positive for malaise/fatigue. Negative for fever and weight loss.  Respiratory: Negative.  Negative for cough, hemoptysis and shortness of breath.   Cardiovascular: Negative.  Negative for chest pain and leg swelling.  Gastrointestinal: Negative.  Negative for abdominal pain, blood in stool and melena.  Genitourinary: Negative.  Negative for hematuria.  Musculoskeletal: Negative.  Negative for myalgias.  Skin: Negative.  Negative for rash.  Neurological: Positive for weakness. Negative for dizziness, sensory change and focal weakness.  Psychiatric/Behavioral: Negative.  The patient is not nervous/anxious.     As per HPI. Otherwise, a complete review of systems is negative.  PAST MEDICAL HISTORY: Past Medical History:  Diagnosis Date  . AICD (automatic cardioverter/defibrillator) present 2006  . Anemia   . Atrial fibrillation ([place])   . CHF (congestive heart failure) ([place])   . CKD (chronic kidney disease), stage III ([place])   .  Colon cancer ([place])    "dx'd 01/24/2018"  . High cholesterol   . History of blood transfusion 01/22/2018   "I lost blood; HgB extremely low"  . History of gout   . Myocardial infarction ([place]) 1986  . OSA on CPAP   . Pneumonia 2015  . Type II diabetes mellitus ([place])     PAST SURGICAL HISTORY: Past Surgical History:  Procedure Laterality Date  . APPENDECTOMY    . ATRIAL FIBRILLATION ABLATION  X 2  . CARDIAC CATHETERIZATION    . CARDIAC DEFIBRILLATOR PLACEMENT  2006   "added lead and had battery changed once since the initial OR" (01/29/2018)  . CATARACT EXTRACTION W/ INTRAOCULAR LENS  IMPLANT, BILATERAL Bilateral   . COLONOSCOPY WITH PROPOFOL N/A 01/23/2018   Procedure: COLONOSCOPY WITH PROPOFOL;  Surgeon: Lucilla Lame, MD;  Location: [facility] ENDOSCOPY;  Service: Endoscopy;  Laterality: N/A;  . CORONARY ARTERY BYPASS GRAFT  1986   "CABG X2"  . CYSTOSCOPY N/A 01/30/2018   Procedure: CYSTOSCOPY FLEXIBLE WITH INSERTION OF CATHETER;  Surgeon: Jovita Kussmaul, MD;  Location: [place];  Service: General;  Laterality: N/A;  . ESOPHAGOGASTRODUODENOSCOPY (EGD) WITH PROPOFOL N/A 01/22/2018   Procedure: ESOPHAGOGASTRODUODENOSCOPY (EGD) WITH PROPOFOL;  Surgeon: Lucilla Lame, MD;  Location: [facility] ENDOSCOPY;  Service: Endoscopy;  Laterality: N/A;  . INSERT / REPLACE / REMOVE PACEMAKER    . LAPAROSCOPIC PARTIAL COLECTOMY N/A 01/30/2018   Procedure: LAPAROSCOPIC ASSISTED EXTENDED RIGHT COLECTOMY;  Surgeon: Jovita Kussmaul, MD;  Location: [place];  Service: General;  Laterality: N/A;  . NASAL SINUS SURGERY      FAMILY HISTORY: Family History  Problem Relation Age of Onset  .  Congestive Heart Failure Mother   . Diabetes Father   . Stroke Father   . Lung cancer Brother 73  . Skin cancer Brother        squamous cell  . Heart attack Maternal Uncle     ADVANCED DIRECTIVES (Y/N):  N  HEALTH MAINTENANCE: Social History   Tobacco Use  . Smoking status: Former Smoker    Packs/day: 1.00    Years: 40.00    Pack  years: 40.00    Types: Cigarettes    Quit date: 1986    Years since quitting: 34.6  . Smokeless tobacco: Never Used  Substance Use Topics  . Alcohol use: Not Currently  . Drug use: Never     Colonoscopy:  PAP:  Bone density:  Lipid panel:  Allergies  Allergen Reactions  . Pseudoephedrine Hcl Other (See Comments)    Other Reaction: ANURIA  . Ace Inhibitors Cough and Other (See Comments)    cough cough   . Nsaids Other (See Comments)    Reaction:  Fluid retention   . Vasotec [Enalapril] Cough    Current Outpatient Medications  Medication Sig Dispense Refill  . acetaminophen (TYLENOL) 500 MG tablet Take 2 tablets (1,000 mg total) by mouth every 8 (eight) hours. 30 tablet 0  . apixaban (ELIQUIS) 5 MG TABS tablet Take 5 mg by mouth 2 (two) times daily.    . carvedilol (COREG) 12.5 MG tablet Take 12.5 mg by mouth 2 (two) times daily.    . Cholecalciferol (VITAMIN D) 2000 UNITS CAPS Take 1 capsule by mouth daily.    Marland Kitchen donepezil (ARICEPT) 10 MG tablet Take 10 mg by mouth at bedtime.    Marland Kitchen ezetimibe (ZETIA) 10 MG tablet Take 10 mg by mouth at bedtime.    . furosemide (LASIX) 40 MG tablet Take 40 mg by mouth daily.    Jerrye Beavers Polysacch (ALCORTIN A) 1-2-1 % GEL Apply 1 application topically as needed for dry skin.  2  . metFORMIN (GLUCOPHAGE) 500 MG tablet Take 1 tablet by mouth 2 (two) times daily.    . Multiple Vitamin (MULTIVITAMIN WITH MINERALS) TABS tablet Take 1 tablet by mouth daily. 30 tablet 0  . polyethylene glycol (MIRALAX / GLYCOLAX) packet Take 17 g by mouth daily. 14 each 0  . rosuvastatin (CRESTOR) 10 MG tablet Take 10 mg by mouth daily.    . sacubitril-valsartan (ENTRESTO) 24-26 MG Take 1 tablet by mouth 2 (two) times daily.    . tamsulosin (FLOMAX) 0.4 MG CAPS capsule Take 1 capsule (0.4 mg total) by mouth at bedtime. 30 capsule 0  . latanoprost (XALATAN) 0.005 % ophthalmic solution Place 1 drop into both eyes 1 day or 1 dose.     No current  facility-administered medications for this visit.     OBJECTIVE: Vitals:   01/08/19 1315  BP: 101/63  Pulse: 78  Temp: 98.3 F (36.8 C)     Body mass index is 28.21 kg/m.    ECOG FS:0 - Asymptomatic  General: Well-developed, well-nourished, no acute distress. Eyes: Pink conjunctiva, anicteric sclera. HEENT: Normocephalic, moist mucous membranes. Lungs: Clear to auscultation bilaterally. Heart: Regular rate and rhythm. No rubs, murmurs, or gallops. Abdomen: Soft, nontender, nondistended. No organomegaly noted, normoactive bowel sounds. Musculoskeletal: No edema, cyanosis, or clubbing. Neuro: Alert, answering all questions appropriately. Cranial nerves grossly intact. Skin: No rashes or petechiae noted. Psych: Normal affect.  LAB RESULTS:  Lab Results  Component Value Date   NA 142 02/12/2018   K  4.4 02/12/2018   CL 108 02/12/2018   CO2 25 02/12/2018   GLUCOSE 100 (H) 02/12/2018   BUN 11 02/12/2018   CREATININE 1.40 (H) 09/21/2018   CALCIUM 9.1 02/12/2018   PROT 5.8 (L) 02/01/2018   ALBUMIN 2.9 (L) 02/01/2018   AST 10 (L) 02/01/2018   ALT 11 02/01/2018   ALKPHOS 51 02/01/2018   BILITOT 1.0 02/01/2018   GFRNONAA 48 (L) 02/12/2018   GFRAA 56 (L) 02/12/2018    Lab Results  Component Value Date   WBC 3.5 (L) 01/08/2019   NEUTROABS 1.5 (L) 01/08/2019   HGB 9.4 (L) 01/08/2019   HCT 28.8 (L) 01/08/2019   MCV 93.2 01/08/2019   PLT 90 (L) 01/08/2019   Lab Results  Component Value Date   IRON 23 (L) 01/08/2019   TIBC 316 01/08/2019   IRONPCTSAT 7 (L) 01/08/2019   Lab Results  Component Value Date   FERRITIN 203 01/08/2019      STUDIES: No results found.  ASSESSMENT: Stage IIa adenocarcinoma of the right colon.  PLAN:   1. Stage IIa adenocarcinoma of the right colon: Patient underwent partial colectomy on January 30, 2018 revealing the above-stated malignancy.  0 of 27 lymph nodes were negative for disease.  Patient noted to have a loss of mismatch  repair proteins MLHH1 and PMS2.  He was also noted to be MSI high.  Patient's with MSI high may have a better prognosis and typically do not benefit from adjuvant 5-FU therapy.  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  Patient's preop CEA was elevated at 8.3, his most recent result continues to be within normal limits at 2.4.  CT scan results from September 21, 2018 revealed no evidence of recurrent or progressive disease.  Patient will require a surveillance colonoscopy at the end of 2020 once we are through the COVID-19 pandemic.   2.  Iron deficiency anemia: Patient's hemoglobin is slowly trending down and is now 9.4.  His iron stores are also decreased.  Proceed with 510 mg IV Feraheme today.  Return to clinic in 1 week for a second dose of IV Feraheme and then in 3 months for an further evaluation and continuation of treatment if necessary.   3.  Thrombocytopenia: Patient's platelet count has dropped to 90, monitor.  I spent a total of 30 minutes face-to-face with the patient of which greater than 50% of the visit was spent in counseling and coordination of care as detailed above.   Patient expressed understanding and was in agreement with this plan. He also understands that He can call clinic at any time with any questions, concerns, or complaints.    Timothy J Finnegan, MD   01/09/2019 7:26 AM     

## 2019-01-07 ENCOUNTER — Other Ambulatory Visit: Payer: Self-pay

## 2019-01-08 ENCOUNTER — Inpatient Hospital Stay: Payer: Medicare Other

## 2019-01-08 ENCOUNTER — Other Ambulatory Visit: Payer: Self-pay

## 2019-01-08 ENCOUNTER — Encounter: Payer: Self-pay | Admitting: Oncology

## 2019-01-08 ENCOUNTER — Inpatient Hospital Stay (HOSPITAL_BASED_OUTPATIENT_CLINIC_OR_DEPARTMENT_OTHER): Payer: Medicare Other | Admitting: Oncology

## 2019-01-08 ENCOUNTER — Inpatient Hospital Stay: Payer: Medicare Other | Attending: Oncology

## 2019-01-08 VITALS — BP 101/63 | HR 78 | Temp 98.3°F | Wt 208.0 lb

## 2019-01-08 DIAGNOSIS — Z79899 Other long term (current) drug therapy: Secondary | ICD-10-CM | POA: Diagnosis not present

## 2019-01-08 DIAGNOSIS — C189 Malignant neoplasm of colon, unspecified: Secondary | ICD-10-CM | POA: Diagnosis not present

## 2019-01-08 DIAGNOSIS — E785 Hyperlipidemia, unspecified: Secondary | ICD-10-CM | POA: Insufficient documentation

## 2019-01-08 DIAGNOSIS — C182 Malignant neoplasm of ascending colon: Secondary | ICD-10-CM | POA: Diagnosis present

## 2019-01-08 DIAGNOSIS — D696 Thrombocytopenia, unspecified: Secondary | ICD-10-CM | POA: Insufficient documentation

## 2019-01-08 DIAGNOSIS — D509 Iron deficiency anemia, unspecified: Secondary | ICD-10-CM

## 2019-01-08 DIAGNOSIS — D638 Anemia in other chronic diseases classified elsewhere: Secondary | ICD-10-CM | POA: Insufficient documentation

## 2019-01-08 LAB — CBC WITH DIFFERENTIAL/PLATELET
Abs Immature Granulocytes: 0.01 10*3/uL (ref 0.00–0.07)
Basophils Absolute: 0 10*3/uL (ref 0.0–0.1)
Basophils Relative: 1 %
Eosinophils Absolute: 0.1 10*3/uL (ref 0.0–0.5)
Eosinophils Relative: 3 %
HCT: 28.8 % — ABNORMAL LOW (ref 39.0–52.0)
Hemoglobin: 9.4 g/dL — ABNORMAL LOW (ref 13.0–17.0)
Immature Granulocytes: 0 %
Lymphocytes Relative: 32 %
Lymphs Abs: 1.1 10*3/uL (ref 0.7–4.0)
MCH: 30.4 pg (ref 26.0–34.0)
MCHC: 32.6 g/dL (ref 30.0–36.0)
MCV: 93.2 fL (ref 80.0–100.0)
Monocytes Absolute: 0.7 10*3/uL (ref 0.1–1.0)
Monocytes Relative: 20 %
Neutro Abs: 1.5 10*3/uL — ABNORMAL LOW (ref 1.7–7.7)
Neutrophils Relative %: 44 %
Platelets: 90 10*3/uL — ABNORMAL LOW (ref 150–400)
RBC: 3.09 MIL/uL — ABNORMAL LOW (ref 4.22–5.81)
RDW: 18.5 % — ABNORMAL HIGH (ref 11.5–15.5)
WBC: 3.5 10*3/uL — ABNORMAL LOW (ref 4.0–10.5)
nRBC: 0 % (ref 0.0–0.2)

## 2019-01-08 LAB — IRON AND TIBC
Iron: 23 ug/dL — ABNORMAL LOW (ref 45–182)
Saturation Ratios: 7 % — ABNORMAL LOW (ref 17.9–39.5)
TIBC: 316 ug/dL (ref 250–450)
UIBC: 293 ug/dL

## 2019-01-08 LAB — FERRITIN: Ferritin: 203 ng/mL (ref 24–336)

## 2019-01-08 MED ORDER — SODIUM CHLORIDE 0.9 % IV SOLN
510.0000 mg | Freq: Once | INTRAVENOUS | Status: AC
  Administered 2019-01-08: 14:00:00 510 mg via INTRAVENOUS
  Filled 2019-01-08: qty 17

## 2019-01-08 MED ORDER — SODIUM CHLORIDE 0.9 % IV SOLN
INTRAVENOUS | Status: DC
  Administered 2019-01-08: 14:00:00 via INTRAVENOUS
  Filled 2019-01-08: qty 250

## 2019-01-09 LAB — CEA: CEA: 2.4 ng/mL (ref 0.0–4.7)

## 2019-01-17 ENCOUNTER — Other Ambulatory Visit: Payer: Self-pay

## 2019-01-18 ENCOUNTER — Inpatient Hospital Stay: Payer: Medicare Other

## 2019-01-18 ENCOUNTER — Other Ambulatory Visit: Payer: Self-pay

## 2019-01-18 VITALS — BP 106/63 | HR 64 | Temp 95.9°F | Resp 20

## 2019-01-18 DIAGNOSIS — D509 Iron deficiency anemia, unspecified: Secondary | ICD-10-CM

## 2019-01-18 DIAGNOSIS — C182 Malignant neoplasm of ascending colon: Secondary | ICD-10-CM | POA: Diagnosis not present

## 2019-01-18 MED ORDER — SODIUM CHLORIDE 0.9 % IV SOLN
INTRAVENOUS | Status: DC
  Administered 2019-01-18: 14:00:00 via INTRAVENOUS
  Filled 2019-01-18: qty 250

## 2019-01-18 MED ORDER — SODIUM CHLORIDE 0.9 % IV SOLN
510.0000 mg | Freq: Once | INTRAVENOUS | Status: AC
  Administered 2019-01-18: 510 mg via INTRAVENOUS
  Filled 2019-01-18: qty 17

## 2019-04-05 NOTE — Progress Notes (Signed)
[place]  Telephone:(336) [phone] Fax:(336) [phone]  ID: Luis Palmer OB: August 13, 1932  MR#: [id number]  QIO#:[id number]  Patient Care Team: Idelle Crouch, MD as PCP - General (Internal Medicine)   CHIEF COMPLAINT: Stage IIa adenocarcinoma of the right colon, iron deficiency anemia.  INTERVAL HISTORY: Patient returns to clinic today for repeat laboratory, further evaluation, and consideration of additional IV Feraheme.  He continues to have chronic weakness and fatigue that is unchanged despite receiving treatment 3 months ago.  He otherwise feels well. He has no neurologic complaints.  He denies any recent fevers or illnesses.  He has a good appetite and denies weight loss.  He denies any chest pain, shortness of breath, cough, or hemoptysis.  He denies any nausea, vomiting, constipation, or diarrhea.  He has no melena or hematochezia.  He has noted no changes in his bowel movements.  He has no urinary complaints.  Patient offers no further specific complaints today.  REVIEW OF SYSTEMS:   Review of Systems  Constitutional: Positive for malaise/fatigue. Negative for fever and weight loss.  Respiratory: Negative.  Negative for cough, hemoptysis and shortness of breath.   Cardiovascular: Negative.  Negative for chest pain and leg swelling.  Gastrointestinal: Negative.  Negative for abdominal pain, blood in stool and melena.  Genitourinary: Negative.  Negative for hematuria.  Musculoskeletal: Negative.  Negative for myalgias.  Skin: Negative.  Negative for rash.  Neurological: Positive for weakness. Negative for dizziness, sensory change and focal weakness.  Psychiatric/Behavioral: Negative.  The patient is not nervous/anxious.     As per HPI. Otherwise, a complete review of systems is negative.  PAST MEDICAL HISTORY: Past Medical History:  Diagnosis Date  . AICD (automatic cardioverter/defibrillator) present 2006  . Anemia   . Atrial fibrillation ([place])    . CHF (congestive heart failure) ([place])   . CKD (chronic kidney disease), stage III   . Colon cancer ([place])    "dx'd 01/24/2018"  . High cholesterol   . History of blood transfusion 01/22/2018   "I lost blood; HgB extremely low"  . History of gout   . Myocardial infarction ([place]) 1986  . OSA on CPAP   . Pneumonia 2015  . Type II diabetes mellitus ([place])     PAST SURGICAL HISTORY: Past Surgical History:  Procedure Laterality Date  . APPENDECTOMY    . ATRIAL FIBRILLATION ABLATION  X 2  . CARDIAC CATHETERIZATION    . CARDIAC DEFIBRILLATOR PLACEMENT  2006   "added lead and had battery changed once since the initial OR" (01/29/2018)  . CATARACT EXTRACTION W/ INTRAOCULAR LENS  IMPLANT, BILATERAL Bilateral   . COLONOSCOPY WITH PROPOFOL N/A 01/23/2018   Procedure: COLONOSCOPY WITH PROPOFOL;  Surgeon: Lucilla Lame, MD;  Location: [facility] ENDOSCOPY;  Service: Endoscopy;  Laterality: N/A;  . CORONARY ARTERY BYPASS GRAFT  1986   "CABG X2"  . CYSTOSCOPY N/A 01/30/2018   Procedure: CYSTOSCOPY FLEXIBLE WITH INSERTION OF CATHETER;  Surgeon: Jovita Kussmaul, MD;  Location: [place];  Service: General;  Laterality: N/A;  . ESOPHAGOGASTRODUODENOSCOPY (EGD) WITH PROPOFOL N/A 01/22/2018   Procedure: ESOPHAGOGASTRODUODENOSCOPY (EGD) WITH PROPOFOL;  Surgeon: Lucilla Lame, MD;  Location: [facility] ENDOSCOPY;  Service: Endoscopy;  Laterality: N/A;  . INSERT / REPLACE / REMOVE PACEMAKER    . LAPAROSCOPIC PARTIAL COLECTOMY N/A 01/30/2018   Procedure: LAPAROSCOPIC ASSISTED EXTENDED RIGHT COLECTOMY;  Surgeon: Jovita Kussmaul, MD;  Location: [place];  Service: General;  Laterality: N/A;  . NASAL SINUS SURGERY  FAMILY HISTORY: Family History  Problem Relation Age of Onset  . Congestive Heart Failure Mother   . Diabetes Father   . Stroke Father   . Lung cancer Brother 93  . Skin cancer Brother        squamous cell  . Heart attack Maternal Uncle     ADVANCED DIRECTIVES (Y/N):  N  HEALTH MAINTENANCE: Social History    Tobacco Use  . Smoking status: Former Smoker    Packs/day: 1.00    Years: 40.00    Pack years: 40.00    Types: Cigarettes    Quit date: 1986    Years since quitting: 34.8  . Smokeless tobacco: Never Used  Substance Use Topics  . Alcohol use: Not Currently  . Drug use: Never     Colonoscopy:  PAP:  Bone density:  Lipid panel:  Allergies  Allergen Reactions  . Pseudoephedrine Hcl Other (See Comments)    Other Reaction: ANURIA  . Ace Inhibitors Cough and Other (See Comments)    cough cough   . Nsaids Other (See Comments)    Reaction:  Fluid retention   . Vasotec [Enalapril] Cough    Current Outpatient Medications  Medication Sig Dispense Refill  . acetaminophen (TYLENOL) 500 MG tablet Take 2 tablets (1,000 mg total) by mouth every 8 (eight) hours. 30 tablet 0  . apixaban (ELIQUIS) 5 MG TABS tablet Take 5 mg by mouth 2 (two) times daily.    . carvedilol (COREG) 12.5 MG tablet Take 12.5 mg by mouth 2 (two) times daily.    . Cholecalciferol (VITAMIN D) 2000 UNITS CAPS Take 1 capsule by mouth daily.    Marland Kitchen donepezil (ARICEPT) 10 MG tablet Take 10 mg by mouth at bedtime.    Marland Kitchen ezetimibe (ZETIA) 10 MG tablet Take 10 mg by mouth at bedtime.    . furosemide (LASIX) 40 MG tablet Take 40 mg by mouth daily.    Jerrye Beavers Polysacch (ALCORTIN A) 1-2-1 % GEL Apply 1 application topically as needed for dry skin.  2  . latanoprost (XALATAN) 0.005 % ophthalmic solution Place 1 drop into both eyes 1 day or 1 dose.    . metFORMIN (GLUCOPHAGE) 500 MG tablet Take 1 tablet by mouth 2 (two) times daily.    . Multiple Vitamin (MULTIVITAMIN WITH MINERALS) TABS tablet Take 1 tablet by mouth daily. 30 tablet 0  . polyethylene glycol (MIRALAX / GLYCOLAX) packet Take 17 g by mouth daily. 14 each 0  . rosuvastatin (CRESTOR) 10 MG tablet Take 10 mg by mouth daily.    . sacubitril-valsartan (ENTRESTO) 24-26 MG Take 1 tablet by mouth 2 (two) times daily.    . tamsulosin (FLOMAX) 0.4 MG CAPS  capsule Take 1 capsule (0.4 mg total) by mouth at bedtime. 30 capsule 0   No current facility-administered medications for this visit.     OBJECTIVE: Vitals:   04/12/19 1019  BP: 111/62  Pulse: 74  Resp: 16  Temp: (!) 96.6 F (35.9 C)  SpO2: 96%     Body mass index is 27.79 kg/m.    ECOG FS:0 - Asymptomatic  General: Well-developed, well-nourished, no acute distress. Eyes: Pink conjunctiva, anicteric sclera. HEENT: Normocephalic, moist mucous membranes. Lungs: Clear to auscultation bilaterally. Heart: Regular rate and rhythm. No rubs, murmurs, or gallops. Abdomen: Soft, nontender, nondistended. No organomegaly noted, normoactive bowel sounds. Musculoskeletal: No edema, cyanosis, or clubbing. Neuro: Alert, answering all questions appropriately. Cranial nerves grossly intact. Skin: No rashes or petechiae noted. Psych: Normal  affect.  LAB RESULTS:  Lab Results  Component Value Date   NA 142 02/12/2018   K 4.4 02/12/2018   CL 108 02/12/2018   CO2 25 02/12/2018   GLUCOSE 100 (H) 02/12/2018   BUN 11 02/12/2018   CREATININE 1.40 (H) 09/21/2018   CALCIUM 9.1 02/12/2018   PROT 5.8 (L) 02/01/2018   ALBUMIN 2.9 (L) 02/01/2018   AST 10 (L) 02/01/2018   ALT 11 02/01/2018   ALKPHOS 51 02/01/2018   BILITOT 1.0 02/01/2018   GFRNONAA 48 (L) 02/12/2018   GFRAA 56 (L) 02/12/2018    Lab Results  Component Value Date   WBC 3.3 (L) 04/12/2019   NEUTROABS 1.7 04/12/2019   HGB 10.9 (L) 04/12/2019   HCT 33.8 (L) 04/12/2019   MCV 99.1 04/12/2019   PLT 85 (L) 04/12/2019   Lab Results  Component Value Date   IRON 23 (L) 01/08/2019   TIBC 316 01/08/2019   IRONPCTSAT 7 (L) 01/08/2019   Lab Results  Component Value Date   FERRITIN 203 01/08/2019      STUDIES: No results found.  ASSESSMENT: Stage IIa adenocarcinoma of the right colon, iron deficiency anemia.  PLAN:   1. Stage IIa adenocarcinoma of the right colon: Patient underwent partial colectomy on January 30, 2018  revealing the above-stated malignancy.  0 of 27 lymph nodes were negative for disease.  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  He was also noted to be MSI high.  Patient's with MSI high may have a better prognosis and typically do not benefit from adjuvant 5-FU therapy.  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  Patient's preop CEA was elevated at 8.3, his most recent result continues to be within normal limits at 2.4.  Today's result is pending.  CT scan results from September 21, 2018 revealed no evidence of recurrent or progressive disease.  Patient will require a surveillance colonoscopy at the end of 2020 once we are through the COVID-19 pandemic.   2.  Iron deficiency anemia: Patient's hemoglobin has improved, but he is still mildly symptomatic.  Iron stores are pending at time of dictation.  We will proceed with 1 dose of 510 mg IV Feraheme today.  He does not require second infusion.  Return to clinic in 3 months with repeat laboratory work, further evaluation, and continuation of treatment if needed.   3.  Thrombocytopenia: Chronic and unchanged.  Patient's platelet count is 85 today.   Patient expressed understanding and was in agreement with this plan. He also understands that He can call clinic at any time with any questions, concerns, or complaints.    Lloyd Huger, MD   04/12/2019 10:59 AM

## 2019-04-11 ENCOUNTER — Other Ambulatory Visit: Payer: Self-pay

## 2019-04-11 NOTE — Progress Notes (Signed)
Patient prescreened for appointment. Patient has no concerns or questions.  

## 2019-04-12 ENCOUNTER — Other Ambulatory Visit: Payer: Self-pay

## 2019-04-12 ENCOUNTER — Inpatient Hospital Stay (HOSPITAL_BASED_OUTPATIENT_CLINIC_OR_DEPARTMENT_OTHER): Payer: Medicare Other | Admitting: Oncology

## 2019-04-12 ENCOUNTER — Inpatient Hospital Stay: Payer: Medicare Other

## 2019-04-12 ENCOUNTER — Inpatient Hospital Stay: Payer: Medicare Other | Attending: Oncology

## 2019-04-12 ENCOUNTER — Encounter: Payer: Self-pay | Admitting: Oncology

## 2019-04-12 VITALS — BP 111/62 | HR 74 | Temp 96.6°F | Resp 16 | Wt 204.9 lb

## 2019-04-12 DIAGNOSIS — Z79899 Other long term (current) drug therapy: Secondary | ICD-10-CM | POA: Diagnosis not present

## 2019-04-12 DIAGNOSIS — E7800 Pure hypercholesterolemia, unspecified: Secondary | ICD-10-CM | POA: Diagnosis not present

## 2019-04-12 DIAGNOSIS — D509 Iron deficiency anemia, unspecified: Secondary | ICD-10-CM

## 2019-04-12 DIAGNOSIS — I252 Old myocardial infarction: Secondary | ICD-10-CM | POA: Insufficient documentation

## 2019-04-12 DIAGNOSIS — Z7984 Long term (current) use of oral hypoglycemic drugs: Secondary | ICD-10-CM | POA: Insufficient documentation

## 2019-04-12 DIAGNOSIS — E1122 Type 2 diabetes mellitus with diabetic chronic kidney disease: Secondary | ICD-10-CM | POA: Insufficient documentation

## 2019-04-12 DIAGNOSIS — F17210 Nicotine dependence, cigarettes, uncomplicated: Secondary | ICD-10-CM | POA: Diagnosis not present

## 2019-04-12 DIAGNOSIS — D696 Thrombocytopenia, unspecified: Secondary | ICD-10-CM | POA: Diagnosis not present

## 2019-04-12 DIAGNOSIS — N189 Chronic kidney disease, unspecified: Secondary | ICD-10-CM | POA: Insufficient documentation

## 2019-04-12 DIAGNOSIS — C182 Malignant neoplasm of ascending colon: Secondary | ICD-10-CM | POA: Insufficient documentation

## 2019-04-12 DIAGNOSIS — C189 Malignant neoplasm of colon, unspecified: Secondary | ICD-10-CM | POA: Diagnosis not present

## 2019-04-12 DIAGNOSIS — G4733 Obstructive sleep apnea (adult) (pediatric): Secondary | ICD-10-CM | POA: Diagnosis not present

## 2019-04-12 DIAGNOSIS — Z7901 Long term (current) use of anticoagulants: Secondary | ICD-10-CM | POA: Diagnosis not present

## 2019-04-12 DIAGNOSIS — I4891 Unspecified atrial fibrillation: Secondary | ICD-10-CM | POA: Diagnosis not present

## 2019-04-12 DIAGNOSIS — Z95810 Presence of automatic (implantable) cardiac defibrillator: Secondary | ICD-10-CM | POA: Insufficient documentation

## 2019-04-12 LAB — CBC WITH DIFFERENTIAL/PLATELET
Abs Immature Granulocytes: 0 10*3/uL (ref 0.00–0.07)
Basophils Absolute: 0.1 10*3/uL (ref 0.0–0.1)
Basophils Relative: 2 %
Eosinophils Absolute: 0.1 10*3/uL (ref 0.0–0.5)
Eosinophils Relative: 3 %
HCT: 33.8 % — ABNORMAL LOW (ref 39.0–52.0)
Hemoglobin: 10.9 g/dL — ABNORMAL LOW (ref 13.0–17.0)
Immature Granulocytes: 0 %
Lymphocytes Relative: 25 %
Lymphs Abs: 0.8 10*3/uL (ref 0.7–4.0)
MCH: 32 pg (ref 26.0–34.0)
MCHC: 32.2 g/dL (ref 30.0–36.0)
MCV: 99.1 fL (ref 80.0–100.0)
Monocytes Absolute: 0.6 10*3/uL (ref 0.1–1.0)
Monocytes Relative: 19 %
Neutro Abs: 1.7 10*3/uL (ref 1.7–7.7)
Neutrophils Relative %: 51 %
Platelets: 85 10*3/uL — ABNORMAL LOW (ref 150–400)
RBC: 3.41 MIL/uL — ABNORMAL LOW (ref 4.22–5.81)
RDW: 14.8 % (ref 11.5–15.5)
WBC: 3.3 10*3/uL — ABNORMAL LOW (ref 4.0–10.5)
nRBC: 0 % (ref 0.0–0.2)

## 2019-04-12 LAB — IRON AND TIBC
Iron: 47 ug/dL (ref 45–182)
Saturation Ratios: 16 % — ABNORMAL LOW (ref 17.9–39.5)
TIBC: 294 ug/dL (ref 250–450)
UIBC: 247 ug/dL

## 2019-04-12 LAB — FERRITIN: Ferritin: 338 ng/mL — ABNORMAL HIGH (ref 24–336)

## 2019-04-12 MED ORDER — SODIUM CHLORIDE 0.9 % IV SOLN
510.0000 mg | Freq: Once | INTRAVENOUS | Status: AC
  Administered 2019-04-12: 510 mg via INTRAVENOUS
  Filled 2019-04-12: qty 17

## 2019-04-12 MED ORDER — SODIUM CHLORIDE 0.9 % IV SOLN
INTRAVENOUS | Status: DC
  Administered 2019-04-12: 11:00:00 via INTRAVENOUS
  Filled 2019-04-12: qty 250

## 2019-04-13 LAB — CEA: CEA: 1.6 ng/mL (ref 0.0–4.7)

## 2019-04-18 ENCOUNTER — Encounter: Payer: Self-pay | Admitting: Oncology

## 2019-07-03 ENCOUNTER — Other Ambulatory Visit (HOSPITAL_COMMUNITY): Payer: Self-pay | Admitting: Physician Assistant

## 2019-07-03 ENCOUNTER — Other Ambulatory Visit: Payer: Self-pay | Admitting: Physician Assistant

## 2019-07-03 DIAGNOSIS — R17 Unspecified jaundice: Secondary | ICD-10-CM

## 2019-07-03 DIAGNOSIS — R531 Weakness: Secondary | ICD-10-CM

## 2019-07-04 ENCOUNTER — Other Ambulatory Visit: Payer: Self-pay

## 2019-07-04 ENCOUNTER — Ambulatory Visit
Admission: RE | Admit: 2019-07-04 | Discharge: 2019-07-04 | Disposition: A | Discharge status: Home / Self Care | Payer: Medicare Other | Source: Ambulatory Visit | Attending: Physician Assistant | Admitting: Physician Assistant

## 2019-07-04 DIAGNOSIS — R17 Unspecified jaundice: Secondary | ICD-10-CM | POA: Insufficient documentation

## 2019-07-04 DIAGNOSIS — R531 Weakness: Secondary | ICD-10-CM | POA: Insufficient documentation

## 2019-07-13 NOTE — Progress Notes (Deleted)
[place]  [phone] [phone]  ID: Luis Palmer OB: 1933/04/21  MR#: [id number]  IRC#:[id number]  Patient Care Team: Idelle Crouch, MD as PCP - General (Internal Medicine)   CHIEF COMPLAINT: Stage IIa adenocarcinoma of the right colon, iron deficiency anemia.  INTERVAL HISTORY: Patient returns to clinic today for repeat laboratory, further evaluation, and consideration of additional IV Feraheme.  He continues to have chronic weakness and fatigue that is unchanged despite receiving treatment 3 months ago.  He otherwise feels well. He has no neurologic complaints.  He denies any recent fevers or illnesses.  He has a good appetite and denies weight loss.  He denies any chest pain, shortness of breath, cough, or hemoptysis.  He denies any nausea, vomiting, constipation, or diarrhea.  He has no melena or hematochezia.  He has noted no changes in his bowel movements.  He has no urinary complaints.  Patient offers no further specific complaints today.  REVIEW OF SYSTEMS:   Review of Systems  Constitutional: Positive for malaise/fatigue. Negative for fever and weight loss.  Respiratory: Negative.  Negative for cough, hemoptysis and shortness of breath.   Cardiovascular: Negative.  Negative for chest pain and leg swelling.  Gastrointestinal: Negative.  Negative for abdominal pain, blood in stool and melena.  Genitourinary: Negative.  Negative for hematuria.  Musculoskeletal: Negative.  Negative for myalgias.  Skin: Negative.  Negative for rash.  Neurological: Positive for weakness. Negative for dizziness, sensory change and focal weakness.  Psychiatric/Behavioral: Negative.  The patient is not nervous/anxious.     As per HPI. Otherwise, a complete review of systems is negative.  PAST MEDICAL HISTORY: Past Medical History:  Diagnosis Date  . AICD (automatic cardioverter/defibrillator) present 2006  . Anemia   . Atrial fibrillation ([place])    . CHF (congestive heart failure) ([place])   . CKD (chronic kidney disease), stage III   . Colon cancer ([place])    "dx'd 01/24/2018"  . High cholesterol   . History of blood transfusion 01/22/2018   "I lost blood; HgB extremely low"  . History of gout   . Myocardial infarction ([place]) 1986  . OSA on CPAP   . Pneumonia 2015  . Type II diabetes mellitus ([place])     PAST SURGICAL HISTORY: Past Surgical History:  Procedure Laterality Date  . APPENDECTOMY    . ATRIAL FIBRILLATION ABLATION  X 2  . CARDIAC CATHETERIZATION    . CARDIAC DEFIBRILLATOR PLACEMENT  2006   "added lead and had battery changed once since the initial OR" (01/29/2018)  . CATARACT EXTRACTION W/ INTRAOCULAR LENS  IMPLANT, BILATERAL Bilateral   . COLONOSCOPY WITH PROPOFOL N/A 01/23/2018   Procedure: COLONOSCOPY WITH PROPOFOL;  Surgeon: Lucilla Lame, MD;  Location: [facility] ENDOSCOPY;  Service: Endoscopy;  Laterality: N/A;  . CORONARY ARTERY BYPASS GRAFT  1986   "CABG X2"  . CYSTOSCOPY N/A 01/30/2018   Procedure: CYSTOSCOPY FLEXIBLE WITH INSERTION OF CATHETER;  Surgeon: Jovita Kussmaul, MD;  Location: [place];  Service: General;  Laterality: N/A;  . ESOPHAGOGASTRODUODENOSCOPY (EGD) WITH PROPOFOL N/A 01/22/2018   Procedure: ESOPHAGOGASTRODUODENOSCOPY (EGD) WITH PROPOFOL;  Surgeon: Lucilla Lame, MD;  Location: [facility] ENDOSCOPY;  Service: Endoscopy;  Laterality: N/A;  . INSERT / REPLACE / REMOVE PACEMAKER    . LAPAROSCOPIC PARTIAL COLECTOMY N/A 01/30/2018   Procedure: LAPAROSCOPIC ASSISTED EXTENDED RIGHT COLECTOMY;  Surgeon: Jovita Kussmaul, MD;  Location: [place];  Service: General;  Laterality: N/A;  . NASAL SINUS SURGERY  FAMILY HISTORY: Family History  Problem Relation Age of Onset  . Congestive Heart Failure Mother   . Diabetes Father   . Stroke Father   . Lung cancer Brother 39  . Skin cancer Brother        squamous cell  . Heart attack Maternal Uncle     ADVANCED DIRECTIVES (Y/N):  N  HEALTH MAINTENANCE: Social History    Tobacco Use  . Smoking status: Former Smoker    Packs/day: 1.00    Years: 40.00    Pack years: 40.00    Types: Cigarettes    Quit date: 1986    Years since quitting: 35.1  . Smokeless tobacco: Never Used  Substance Use Topics  . Alcohol use: Not Currently  . Drug use: Never     Colonoscopy:  PAP:  Bone density:  Lipid panel:  Allergies  Allergen Reactions  . Pseudoephedrine Hcl Other (See Comments)    Other Reaction: ANURIA  . Ace Inhibitors Cough and Other (See Comments)    cough cough   . Nsaids Other (See Comments)    Reaction:  Fluid retention   . Vasotec [Enalapril] Cough    Current Outpatient Medications  Medication Sig Dispense Refill  . acetaminophen (TYLENOL) 500 MG tablet Take 2 tablets (1,000 mg total) by mouth every 8 (eight) hours. 30 tablet 0  . apixaban (ELIQUIS) 5 MG TABS tablet Take 5 mg by mouth 2 (two) times daily.    . carvedilol (COREG) 12.5 MG tablet Take 12.5 mg by mouth 2 (two) times daily.    . Cholecalciferol (VITAMIN D) 2000 UNITS CAPS Take 1 capsule by mouth daily.    Marland Kitchen donepezil (ARICEPT) 10 MG tablet Take 10 mg by mouth at bedtime.    Marland Kitchen ezetimibe (ZETIA) 10 MG tablet Take 10 mg by mouth at bedtime.    . furosemide (LASIX) 40 MG tablet Take 40 mg by mouth daily.    Jerrye Beavers Polysacch (ALCORTIN A) 1-2-1 % GEL Apply 1 application topically as needed for dry skin.  2  . latanoprost (XALATAN) 0.005 % ophthalmic solution Place 1 drop into both eyes 1 day or 1 dose.    . metFORMIN (GLUCOPHAGE) 500 MG tablet Take 1 tablet by mouth 2 (two) times daily.    . Multiple Vitamin (MULTIVITAMIN WITH MINERALS) TABS tablet Take 1 tablet by mouth daily. 30 tablet 0  . polyethylene glycol (MIRALAX / GLYCOLAX) packet Take 17 g by mouth daily. 14 each 0  . rosuvastatin (CRESTOR) 10 MG tablet Take 10 mg by mouth daily.    . sacubitril-valsartan (ENTRESTO) 24-26 MG Take 1 tablet by mouth 2 (two) times daily.    . tamsulosin (FLOMAX) 0.4 MG CAPS  capsule Take 1 capsule (0.4 mg total) by mouth at bedtime. 30 capsule 0   No current facility-administered medications for this visit.    OBJECTIVE: There were no vitals filed for this visit.   There is no height or weight on file to calculate BMI.    ECOG FS:0 - Asymptomatic  General: Well-developed, well-nourished, no acute distress. Eyes: Pink conjunctiva, anicteric sclera. HEENT: Normocephalic, moist mucous membranes. Lungs: Clear to auscultation bilaterally. Heart: Regular rate and rhythm. No rubs, murmurs, or gallops. Abdomen: Soft, nontender, nondistended. No organomegaly noted, normoactive bowel sounds. Musculoskeletal: No edema, cyanosis, or clubbing. Neuro: Alert, answering all questions appropriately. Cranial nerves grossly intact. Skin: No rashes or petechiae noted. Psych: Normal affect.  LAB RESULTS:  Lab Results  Component Value Date   NA  142 02/12/2018   K 4.4 02/12/2018   CL 108 02/12/2018   CO2 25 02/12/2018   GLUCOSE 100 (H) 02/12/2018   BUN 11 02/12/2018   CREATININE 1.40 (H) 09/21/2018   CALCIUM 9.1 02/12/2018   PROT 5.8 (L) 02/01/2018   ALBUMIN 2.9 (L) 02/01/2018   AST 10 (L) 02/01/2018   ALT 11 02/01/2018   ALKPHOS 51 02/01/2018   BILITOT 1.0 02/01/2018   GFRNONAA 48 (L) 02/12/2018   GFRAA 56 (L) 02/12/2018    Lab Results  Component Value Date   WBC 3.3 (L) 04/12/2019   NEUTROABS 1.7 04/12/2019   HGB 10.9 (L) 04/12/2019   HCT 33.8 (L) 04/12/2019   MCV 99.1 04/12/2019   PLT 85 (L) 04/12/2019   Lab Results  Component Value Date   IRON 47 04/12/2019   TIBC 294 04/12/2019   IRONPCTSAT 16 (L) 04/12/2019   Lab Results  Component Value Date   FERRITIN 338 (H) 04/12/2019      STUDIES: US Abdomen Complete  Result Date: 07/04/2019 CLINICAL DATA:  Elevated bilirubin EXAM: ABDOMEN ULTRASOUND COMPLETE COMPARISON:  09/21/2018 FINDINGS: Gallbladder: No gallstones or concerning wall thickening. No sonographic Murphy sign noted by sonographer.  Common bile duct: Diameter: 2 mm. Liver: No focal lesion identified. Within normal limits in parenchymal echogenicity. Portal vein is patent on color Doppler imaging with normal direction of blood flow towards the liver. IVC: No abnormality visualized. Pancreas: Thick appearance of the pancreas, especially when compared to prior, diffuse and up to 2.1 cm anterior to posterior. Spleen: Granulomatous calcifications. Right Kidney: Length: 10.7 cm. Echogenicity within normal limits. No mass or hydronephrosis visualized. Left Kidney: Length: 10 cm. Echogenicity within normal limits. No mass or hydronephrosis visualized. Abdominal aorta: Atherosclerosis with no aneurysm visualized. Other findings: Small borderline moderate ascites. At least moderate right pleural effusion which is increased from comparison. IMPRESSION: 1. Small volume ascites that is new from 09/21/2018 CT. There is an at least moderate right pleural effusion which has increased from the same study. 2. Diffusely thickened appearance of the pancreas which could be from overlying fat but please correlate with pancreatitis labs. 3. Patent and antegrade flow in the main portal vein. Electronically Signed   By: Monte Fantasia M.D.   On: 07/04/2019 10:05    ASSESSMENT: Stage IIa adenocarcinoma of the right colon, iron deficiency anemia.  PLAN:   1. Stage IIa adenocarcinoma of the right colon: Patient underwent partial colectomy on January 30, 2018 revealing the above-stated malignancy.  0 of 27 lymph nodes were negative for disease.  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  He was also noted to be MSI high.  Patient's with MSI high may have a better prognosis and typically do not benefit from adjuvant 5-FU therapy.  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  Patient's preop CEA was elevated at 8.3, his most recent result continues to be within normal limits at 2.4.  Today's result is pending.  CT  scan results from September 21, 2018 revealed no evidence of recurrent or progressive disease.  Patient will require a surveillance colonoscopy at the end of 2020 once we are through the COVID-19 pandemic.   2.  Iron deficiency anemia: Patient's hemoglobin has improved, but he is still mildly symptomatic.  Iron stores are pending at time of dictation.  We will proceed with 1 dose of 510 mg IV Feraheme today.  He does not require second infusion.  Return  to clinic in 3 months with repeat laboratory work, further evaluation, and continuation of treatment if needed.   3.  Thrombocytopenia: Chronic and unchanged.  Patient's platelet count is 85 today.   Patient expressed understanding and was in agreement with this plan. He also understands that He can call clinic at any time with any questions, concerns, or complaints.    Lloyd Huger, MD   07/13/2019 10:40 AM

## 2019-07-17 ENCOUNTER — Inpatient Hospital Stay: Payer: Medicare Other | Attending: Oncology | Admitting: Oncology

## 2019-07-17 ENCOUNTER — Encounter: Payer: Self-pay | Admitting: Oncology

## 2019-07-17 ENCOUNTER — Inpatient Hospital Stay: Payer: Medicare Other

## 2019-07-17 ENCOUNTER — Inpatient Hospital Stay: Payer: Medicare Other | Admitting: Oncology

## 2019-07-17 ENCOUNTER — Other Ambulatory Visit: Payer: Self-pay

## 2019-07-17 VITALS — BP 84/55 | HR 62 | Temp 95.6°F | Wt 202.7 lb

## 2019-07-17 VITALS — BP 91/65 | HR 66 | Resp 18

## 2019-07-17 DIAGNOSIS — G4733 Obstructive sleep apnea (adult) (pediatric): Secondary | ICD-10-CM | POA: Insufficient documentation

## 2019-07-17 DIAGNOSIS — D696 Thrombocytopenia, unspecified: Secondary | ICD-10-CM | POA: Diagnosis not present

## 2019-07-17 DIAGNOSIS — Z85038 Personal history of other malignant neoplasm of large intestine: Secondary | ICD-10-CM | POA: Insufficient documentation

## 2019-07-17 DIAGNOSIS — D509 Iron deficiency anemia, unspecified: Secondary | ICD-10-CM

## 2019-07-17 DIAGNOSIS — Z87891 Personal history of nicotine dependence: Secondary | ICD-10-CM | POA: Diagnosis not present

## 2019-07-17 DIAGNOSIS — Z7984 Long term (current) use of oral hypoglycemic drugs: Secondary | ICD-10-CM | POA: Diagnosis not present

## 2019-07-17 DIAGNOSIS — Z7901 Long term (current) use of anticoagulants: Secondary | ICD-10-CM | POA: Diagnosis not present

## 2019-07-17 DIAGNOSIS — Z9221 Personal history of antineoplastic chemotherapy: Secondary | ICD-10-CM | POA: Diagnosis not present

## 2019-07-17 DIAGNOSIS — I4891 Unspecified atrial fibrillation: Secondary | ICD-10-CM | POA: Diagnosis not present

## 2019-07-17 DIAGNOSIS — E119 Type 2 diabetes mellitus without complications: Secondary | ICD-10-CM | POA: Insufficient documentation

## 2019-07-17 DIAGNOSIS — R7989 Other specified abnormal findings of blood chemistry: Secondary | ICD-10-CM | POA: Diagnosis not present

## 2019-07-17 DIAGNOSIS — I252 Old myocardial infarction: Secondary | ICD-10-CM | POA: Insufficient documentation

## 2019-07-17 DIAGNOSIS — C189 Malignant neoplasm of colon, unspecified: Secondary | ICD-10-CM

## 2019-07-17 DIAGNOSIS — Z79899 Other long term (current) drug therapy: Secondary | ICD-10-CM | POA: Insufficient documentation

## 2019-07-17 LAB — IRON AND TIBC
Iron: 47 ug/dL (ref 45–182)
Saturation Ratios: 15 % — ABNORMAL LOW (ref 17.9–39.5)
TIBC: 309 ug/dL (ref 250–450)
UIBC: 262 ug/dL

## 2019-07-17 LAB — CBC WITH DIFFERENTIAL/PLATELET
Abs Immature Granulocytes: 0.01 10*3/uL (ref 0.00–0.07)
Basophils Absolute: 0 10*3/uL (ref 0.0–0.1)
Basophils Relative: 1 %
Eosinophils Absolute: 0.2 10*3/uL (ref 0.0–0.5)
Eosinophils Relative: 5 %
HCT: 32 % — ABNORMAL LOW (ref 39.0–52.0)
Hemoglobin: 9.9 g/dL — ABNORMAL LOW (ref 13.0–17.0)
Immature Granulocytes: 0 %
Lymphocytes Relative: 26 %
Lymphs Abs: 0.9 10*3/uL (ref 0.7–4.0)
MCH: 30.4 pg (ref 26.0–34.0)
MCHC: 30.9 g/dL (ref 30.0–36.0)
MCV: 98.2 fL (ref 80.0–100.0)
Monocytes Absolute: 0.7 10*3/uL (ref 0.1–1.0)
Monocytes Relative: 20 %
Neutro Abs: 1.6 10*3/uL — ABNORMAL LOW (ref 1.7–7.7)
Neutrophils Relative %: 48 %
Platelets: 105 10*3/uL — ABNORMAL LOW (ref 150–400)
RBC: 3.26 MIL/uL — ABNORMAL LOW (ref 4.22–5.81)
RDW: 17.2 % — ABNORMAL HIGH (ref 11.5–15.5)
WBC: 3.4 10*3/uL — ABNORMAL LOW (ref 4.0–10.5)
nRBC: 0 % (ref 0.0–0.2)

## 2019-07-17 LAB — FERRITIN: Ferritin: 421 ng/mL — ABNORMAL HIGH (ref 24–336)

## 2019-07-17 MED ORDER — SODIUM CHLORIDE 0.9 % IV SOLN
510.0000 mg | Freq: Once | INTRAVENOUS | Status: AC
  Administered 2019-07-17: 510 mg via INTRAVENOUS
  Filled 2019-07-17: qty 17

## 2019-07-17 MED ORDER — SODIUM CHLORIDE 0.9 % IV SOLN
INTRAVENOUS | Status: DC
  Filled 2019-07-17: qty 250

## 2019-07-18 ENCOUNTER — Other Ambulatory Visit: Payer: Self-pay | Admitting: Emergency Medicine

## 2019-07-18 DIAGNOSIS — D509 Iron deficiency anemia, unspecified: Secondary | ICD-10-CM

## 2019-07-18 DIAGNOSIS — C189 Malignant neoplasm of colon, unspecified: Secondary | ICD-10-CM

## 2019-07-18 LAB — CEA: CEA: 1.3 ng/mL (ref 0.0–4.7)

## 2019-07-18 NOTE — Progress Notes (Signed)
[place]  [phone] Fax:(336) [phone]  ID: Luis Palmer OB: 1932-10-03  MR#: [id number]  QQI#:[id number]  Patient Care Team: Idelle Crouch, MD as PCP - General (Internal Medicine)   CHIEF COMPLAINT: Stage IIa adenocarcinoma of the right colon, iron deficiency anemia.  INTERVAL HISTORY: Patient returns to clinic today for repeat laboratory work, further evaluation, consideration of additional IV Feraheme.  He continues to have chronic weakness and fatigue that is essentially unchanged. He otherwise feels well. He has no neurologic complaints.  He denies any recent fevers or illnesses.  He has a good appetite and denies weight loss.  He denies any chest pain, shortness of breath, cough, or hemoptysis.  He denies any nausea, vomiting, constipation, or diarrhea.  He has no melena or hematochezia.  He has noted no changes in his bowel movements.  He has no urinary complaints.  Patient offers no further specific complaints today.  REVIEW OF SYSTEMS:   Review of Systems  Constitutional: Positive for malaise/fatigue. Negative for fever and weight loss.  Respiratory: Negative.  Negative for cough, hemoptysis and shortness of breath.   Cardiovascular: Negative.  Negative for chest pain and leg swelling.  Gastrointestinal: Negative.  Negative for abdominal pain, blood in stool and melena.  Genitourinary: Negative.  Negative for hematuria.  Musculoskeletal: Negative.  Negative for myalgias.  Skin: Negative.  Negative for rash.  Neurological: Positive for weakness. Negative for dizziness, sensory change and focal weakness.  Psychiatric/Behavioral: Negative.  The patient is not nervous/anxious.     As per HPI. Otherwise, a complete review of systems is negative.  PAST MEDICAL HISTORY: Past Medical History:  Diagnosis Date  . AICD (automatic cardioverter/defibrillator) present 2006  . Anemia   . Atrial fibrillation ([place])   . CHF (congestive heart  failure) ([place])   . CKD (chronic kidney disease), stage III   . Colon cancer ([place])    "dx'd 01/24/2018"  . High cholesterol   . History of blood transfusion 01/22/2018   "I lost blood; HgB extremely low"  . History of gout   . Myocardial infarction ([place]) 1986  . OSA on CPAP   . Pneumonia 2015  . Type II diabetes mellitus ([place])     PAST SURGICAL HISTORY: Past Surgical History:  Procedure Laterality Date  . APPENDECTOMY    . ATRIAL FIBRILLATION ABLATION  X 2  . CARDIAC CATHETERIZATION    . CARDIAC DEFIBRILLATOR PLACEMENT  2006   "added lead and had battery changed once since the initial OR" (01/29/2018)  . CATARACT EXTRACTION W/ INTRAOCULAR LENS  IMPLANT, BILATERAL Bilateral   . COLONOSCOPY WITH PROPOFOL N/A 01/23/2018   Procedure: COLONOSCOPY WITH PROPOFOL;  Surgeon: Lucilla Lame, MD;  [facility] ENDOSCOPY;  Service: Endoscopy;  Laterality: N/A;  . CORONARY ARTERY BYPASS GRAFT  1986   "CABG X2"  . CYSTOSCOPY N/A 01/30/2018   Procedure: CYSTOSCOPY FLEXIBLE WITH INSERTION OF CATHETER;  Surgeon: Jovita Kussmaul, MD;  Location: [place];  Service: General;  Laterality: N/A;  . ESOPHAGOGASTRODUODENOSCOPY (EGD) WITH PROPOFOL N/A 01/22/2018   Procedure: ESOPHAGOGASTRODUODENOSCOPY (EGD) WITH PROPOFOL;  Surgeon: Lucilla Lame, MD;  Location: [facility] ENDOSCOPY;  Service: Endoscopy;  Laterality: N/A;  . INSERT / REPLACE / REMOVE PACEMAKER    . LAPAROSCOPIC PARTIAL COLECTOMY N/A 01/30/2018   Procedure: LAPAROSCOPIC ASSISTED EXTENDED RIGHT COLECTOMY;  Surgeon: Jovita Kussmaul, MD;  Location: [place];  Service: General;  Laterality: N/A;  . NASAL SINUS SURGERY      FAMILY HISTORY: Family History  Problem Relation Age of Onset  . Congestive Heart Failure Mother   . Diabetes Father   . Stroke Father   . Lung cancer Brother 37  . Skin cancer Brother        squamous cell  . Heart attack Maternal Uncle     ADVANCED DIRECTIVES (Y/N):  N  HEALTH MAINTENANCE: Social History   Tobacco Use  . Smoking  status: Former Smoker    Packs/day: 1.00    Years: 40.00    Pack years: 40.00    Types: Cigarettes    Quit date: 1986    Years since quitting: 35.1  . Smokeless tobacco: Never Used  Substance Use Topics  . Alcohol use: Not Currently  . Drug use: Never     Colonoscopy:  PAP:  Bone density:  Lipid panel:  Allergies  Allergen Reactions  . Pseudoephedrine Hcl Other (See Comments)    Other Reaction: ANURIA  . Ace Inhibitors Cough and Other (See Comments)    cough cough   . Nsaids Other (See Comments)    Reaction:  Fluid retention   . Vasotec [Enalapril] Cough    Current Outpatient Medications  Medication Sig Dispense Refill  . acetaminophen (TYLENOL) 500 MG tablet Take 2 tablets (1,000 mg total) by mouth every 8 (eight) hours. 30 tablet 0  . apixaban (ELIQUIS) 5 MG TABS tablet Take 5 mg by mouth 2 (two) times daily.    . carvedilol (COREG) 12.5 MG tablet Take 12.5 mg by mouth 2 (two) times daily.    . Cholecalciferol (VITAMIN D) 2000 UNITS CAPS Take 1 capsule by mouth daily.    Marland Kitchen donepezil (ARICEPT) 10 MG tablet Take 10 mg by mouth at bedtime.    Marland Kitchen ezetimibe (ZETIA) 10 MG tablet Take 10 mg by mouth at bedtime.    . furosemide (LASIX) 40 MG tablet Take 40 mg by mouth daily.    Jerrye Beavers Polysacch (ALCORTIN A) 1-2-1 % GEL Apply 1 application topically as needed for dry skin.  2  . latanoprost (XALATAN) 0.005 % ophthalmic solution Place 1 drop into both eyes 1 day or 1 dose.    . metFORMIN (GLUCOPHAGE) 500 MG tablet Take 1 tablet by mouth 2 (two) times daily.    . Multiple Vitamin (MULTIVITAMIN WITH MINERALS) TABS tablet Take 1 tablet by mouth daily. 30 tablet 0  . polyethylene glycol (MIRALAX / GLYCOLAX) packet Take 17 g by mouth daily. 14 each 0  . rosuvastatin (CRESTOR) 10 MG tablet Take 10 mg by mouth daily.    . sacubitril-valsartan (ENTRESTO) 24-26 MG Take 1 tablet by mouth 2 (two) times daily.    . tamsulosin (FLOMAX) 0.4 MG CAPS capsule Take 1 capsule (0.4 mg  total) by mouth at bedtime. 30 capsule 0   No current facility-administered medications for this visit.    OBJECTIVE: Vitals:   07/17/19 0959  BP: (!) 84/55  Pulse: 62  Temp: (!) 95.6 F (35.3 C)     Body mass index is 27.49 kg/m.    ECOG FS:1 - Symptomatic but completely ambulatory  General: Well-developed, well-nourished, no acute distress.  Sitting in a wheelchair. Eyes: Pink conjunctiva, anicteric sclera. HEENT: Normocephalic, moist mucous membranes. Lungs: No audible wheezing or coughing. Heart: Regular rate and rhythm. Abdomen: Soft, nontender, no obvious distention. Musculoskeletal: No edema, cyanosis, or clubbing. Neuro: Alert, answering all questions appropriately. Cranial nerves grossly intact. Skin: No rashes or petechiae noted. Psych: Normal affect.  LAB RESULTS:  Lab Results  Component Value Date  NA 142 02/12/2018   K 4.4 02/12/2018   CL 108 02/12/2018   CO2 25 02/12/2018   GLUCOSE 100 (H) 02/12/2018   BUN 11 02/12/2018   CREATININE 1.40 (H) 09/21/2018   CALCIUM 9.1 02/12/2018   PROT 5.8 (L) 02/01/2018   ALBUMIN 2.9 (L) 02/01/2018   AST 10 (L) 02/01/2018   ALT 11 02/01/2018   ALKPHOS 51 02/01/2018   BILITOT 1.0 02/01/2018   GFRNONAA 48 (L) 02/12/2018   GFRAA 56 (L) 02/12/2018    Lab Results  Component Value Date   WBC 3.4 (L) 07/17/2019   NEUTROABS 1.6 (L) 07/17/2019   HGB 9.9 (L) 07/17/2019   HCT 32.0 (L) 07/17/2019   MCV 98.2 07/17/2019   PLT 105 (L) 07/17/2019   Lab Results  Component Value Date   IRON 47 07/17/2019   TIBC 309 07/17/2019   IRONPCTSAT 15 (L) 07/17/2019   Lab Results  Component Value Date   FERRITIN 421 (H) 07/17/2019      STUDIES: US Abdomen Complete  Result Date: 07/04/2019 CLINICAL DATA:  Elevated bilirubin EXAM: ABDOMEN ULTRASOUND COMPLETE COMPARISON:  09/21/2018 FINDINGS: Gallbladder: No gallstones or concerning wall thickening. No sonographic Murphy sign noted by sonographer. Common bile duct: Diameter: 2  mm. Liver: No focal lesion identified. Within normal limits in parenchymal echogenicity. Portal vein is patent on color Doppler imaging with normal direction of blood flow towards the liver. IVC: No abnormality visualized. Pancreas: Thick appearance of the pancreas, especially when compared to prior, diffuse and up to 2.1 cm anterior to posterior. Spleen: Granulomatous calcifications. Right Kidney: Length: 10.7 cm. Echogenicity within normal limits. No mass or hydronephrosis visualized. Left Kidney: Length: 10 cm. Echogenicity within normal limits. No mass or hydronephrosis visualized. Abdominal aorta: Atherosclerosis with no aneurysm visualized. Other findings: Small borderline moderate ascites. At least moderate right pleural effusion which is increased from comparison. IMPRESSION: 1. Small volume ascites that is new from 09/21/2018 CT. There is an at least moderate right pleural effusion which has increased from the same study. 2. Diffusely thickened appearance of the pancreas which could be from overlying fat but please correlate with pancreatitis labs. 3. Patent and antegrade flow in the main portal vein. Electronically Signed   By: Monte Fantasia M.D.   On: 07/04/2019 10:05    ASSESSMENT: Stage IIa adenocarcinoma of the right colon, iron deficiency anemia.  PLAN:   1. Stage IIa adenocarcinoma of the right colon: Patient underwent partial colectomy on January 30, 2018 revealing the above-stated malignancy.  0 of 27 lymph nodes were negative for disease.  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  He was also noted to be MSI high.  Patient's with MSI high may have a better prognosis and typically do not benefit from adjuvant 5-FU therapy.  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  Patient's preop CEA was elevated at 8.3.  His most recent results continue to be within normal limits at 1.3. CT scan results from September 21, 2018 revealed no evidence of  recurrent or progressive disease.  Patient will require surveillance colonoscopy in the near future and a referral back to [facility] gastroenterology has been placed. 2.  Iron deficiency anemia: Patient's hemoglobin has trended down and he continues to have a persistently low saturation ratio of 15%.  Ferritin is likely elevated secondary an acute phase reactant.  He is also symptomatic.  Proceed with 510 mg Feraheme today.  Return to clinic in 1  week for second infusion.  Patient was then return to clinic in 3 months with repeat laboratory work, further evaluation, and consideration of additional treatment if necessary.   3.  Thrombocytopenia: Chronic and unchanged.  Patient's platelet count has slightly improved to 105. 4.  Elevated ferritin: Possibly secondary to acute phase reactant, monitor.   Patient expressed understanding and was in agreement with this plan. He also understands that He can call clinic at any time with any questions, concerns, or complaints.    Lloyd Huger, MD   07/18/2019 9:53 AM

## 2019-07-25 ENCOUNTER — Other Ambulatory Visit: Payer: Self-pay

## 2019-07-25 ENCOUNTER — Inpatient Hospital Stay: Payer: Medicare Other

## 2019-07-25 VITALS — BP 91/62 | HR 77 | Temp 96.9°F | Resp 18

## 2019-07-25 DIAGNOSIS — D509 Iron deficiency anemia, unspecified: Secondary | ICD-10-CM | POA: Diagnosis not present

## 2019-07-25 MED ORDER — SODIUM CHLORIDE 0.9 % IV SOLN
INTRAVENOUS | Status: DC
  Filled 2019-07-25: qty 250

## 2019-07-25 MED ORDER — SODIUM CHLORIDE 0.9 % IV SOLN
510.0000 mg | Freq: Once | INTRAVENOUS | Status: AC
  Administered 2019-07-25: 510 mg via INTRAVENOUS
  Filled 2019-07-25: qty 510

## 2019-08-01 IMAGING — DX DG CHEST 2V
3 series · 3 of 3 positions shown · non-contrast
Comparison: CT 01/23/2018

CLINICAL DATA: Congestive heart failure

EXAM:
CHEST - 2 VIEW

[chest pa]
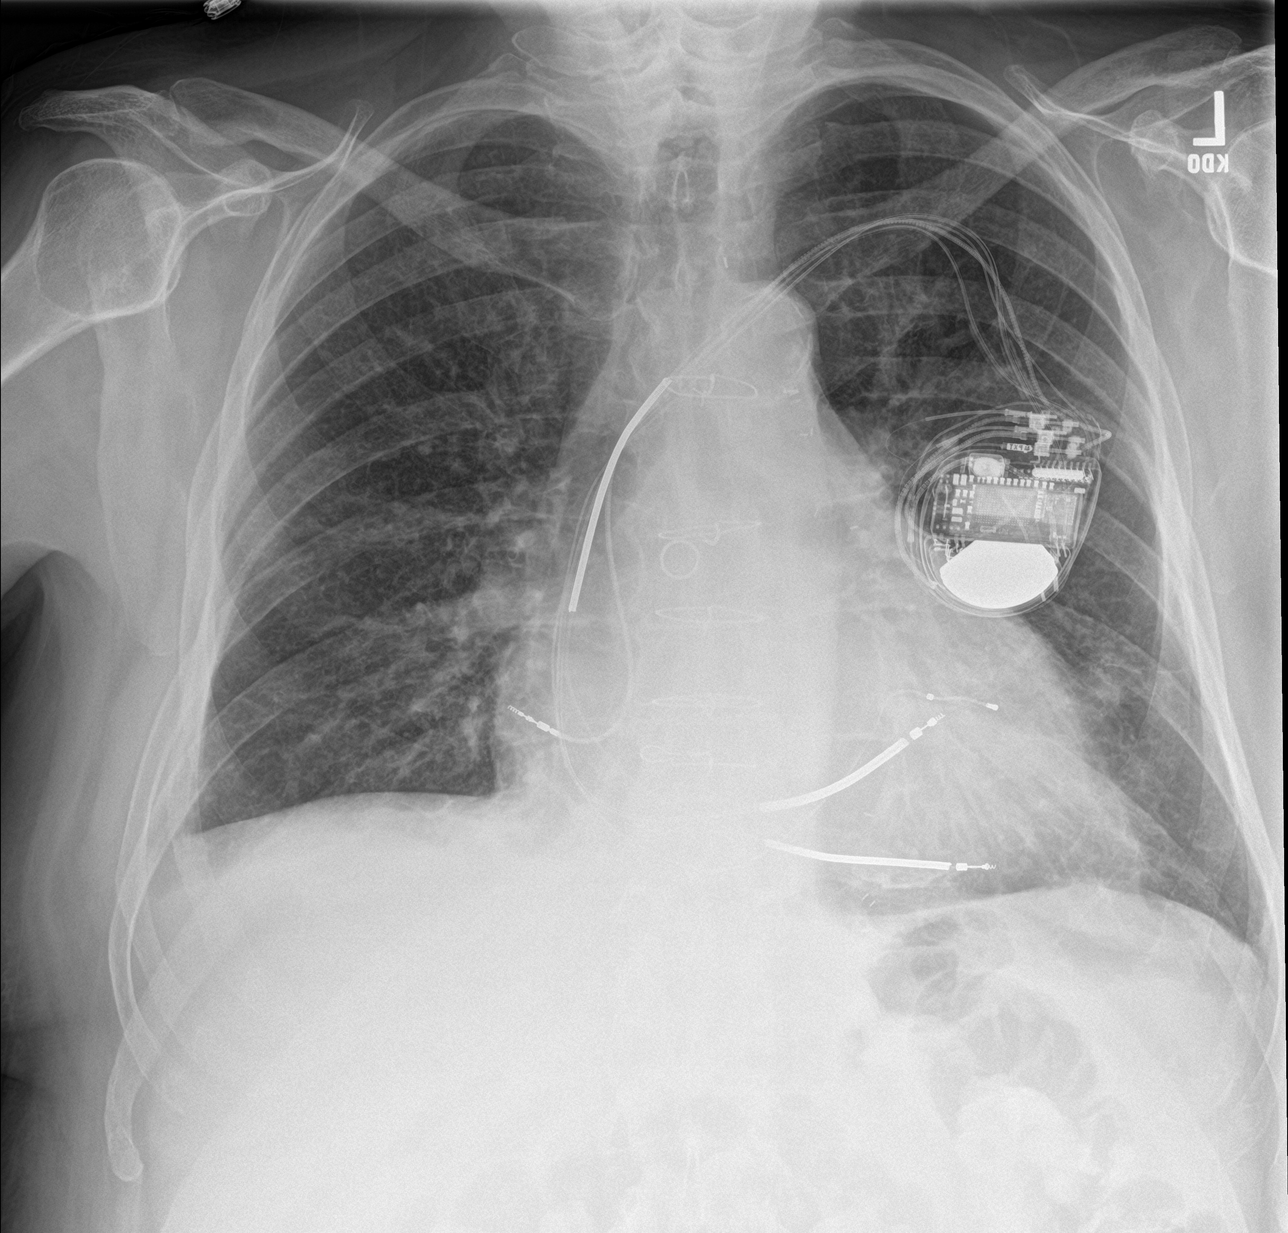

[chest lat (1 of 2)]
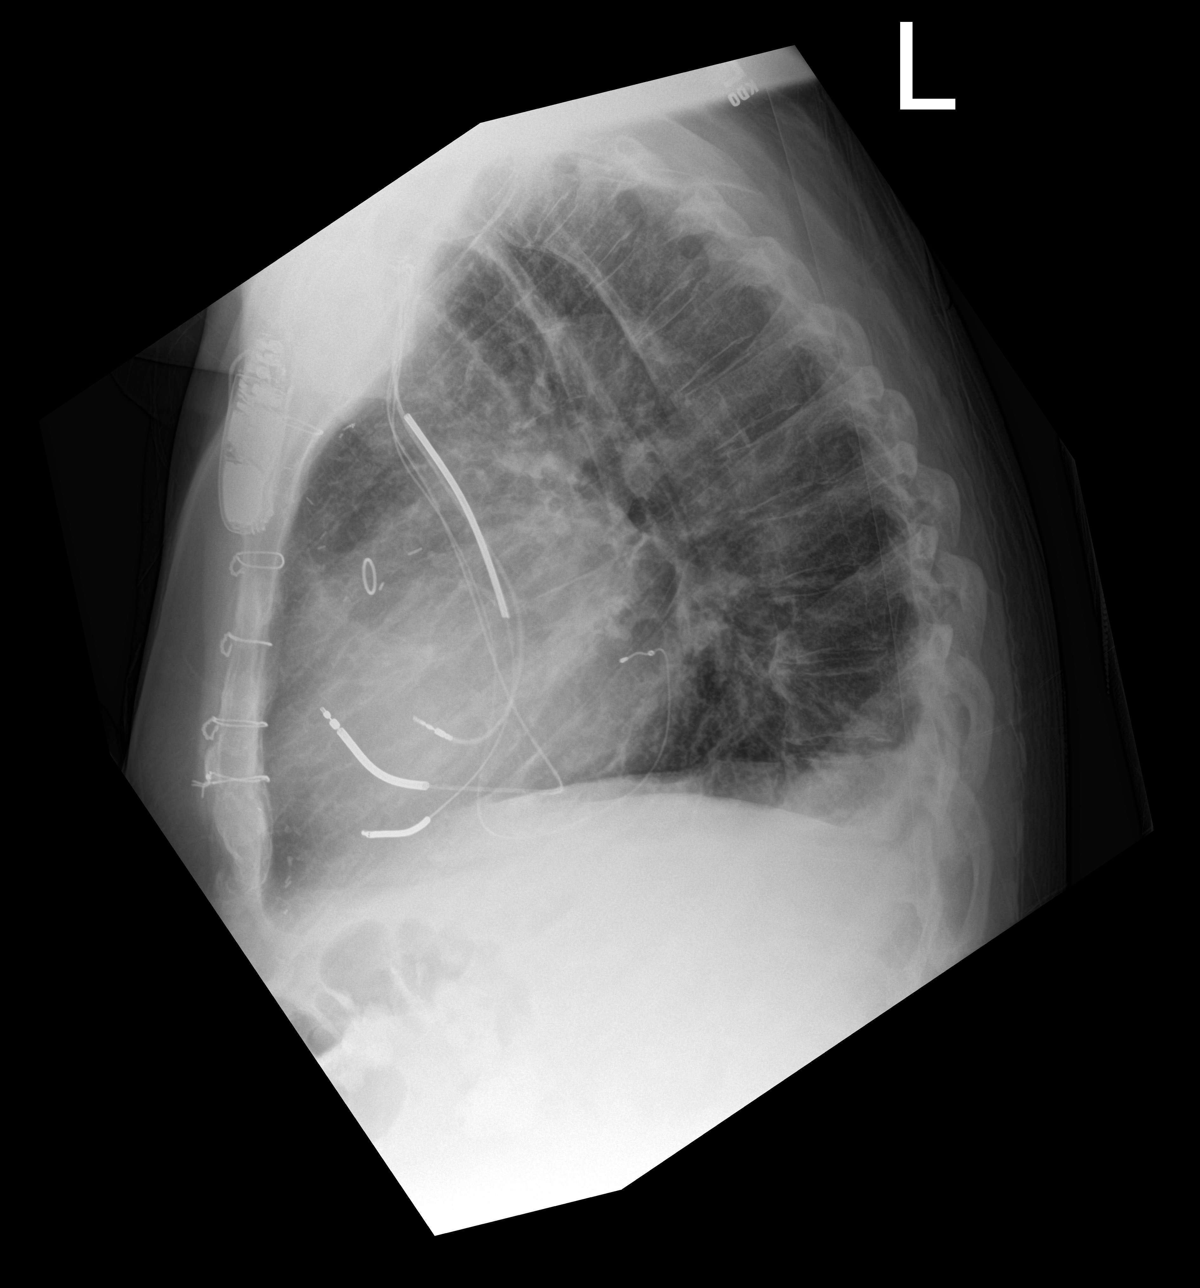

[chest lat (2 of 2)]
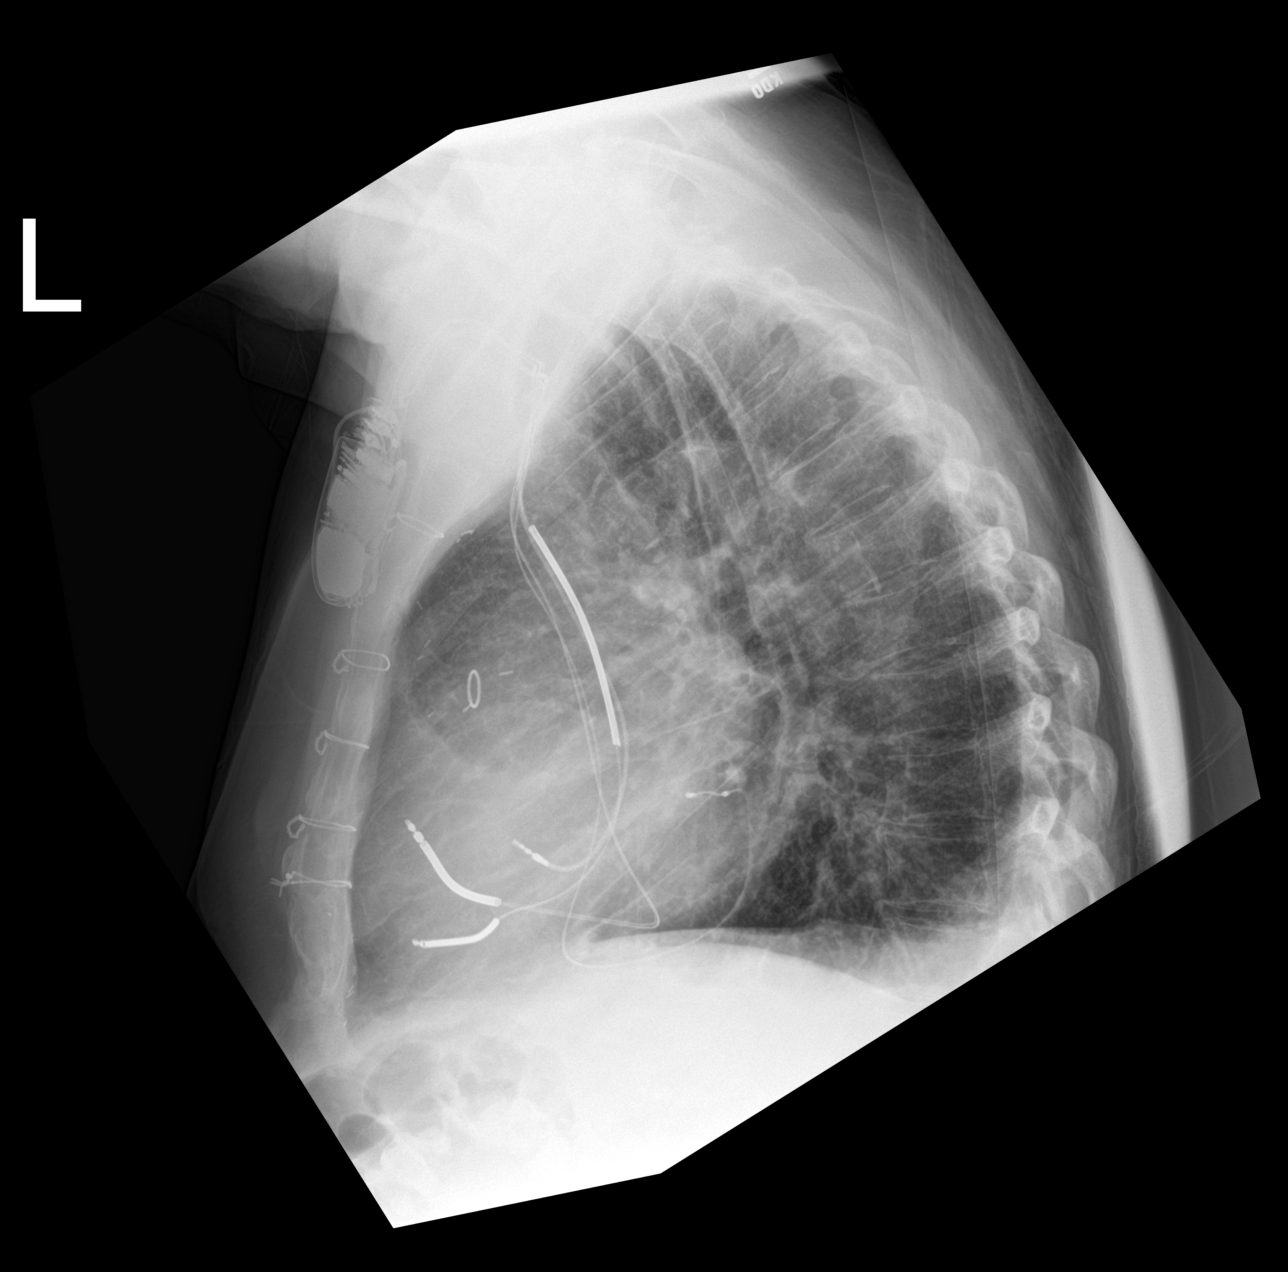

[3 of 3 positions shown; findings below may reference images not displayed]

FINDINGS: Cardiomegaly with moderate aortic atherosclerosis. Left-sided ICD
device projects over the mid left hemithorax. Leads in the right
atrium, right ventricle and coronary sinus are noted. Small
bilateral pleural effusions blunting the costophrenic angles. No
overt pulmonary edema. Mild interstitial change of the lungs.
IMPRESSION: Cardiomegaly with aortic atherosclerosis. Small bilateral pleural
effusions. No active pulmonary disease.

## 2019-08-12 ENCOUNTER — Telehealth: Payer: Self-pay

## 2019-08-12 NOTE — Telephone Encounter (Signed)
Patient wife calling to let Dr Raliegh Ip know patient has been still getting blisters on the top of feet, she has been using Nesporin and wrapping with gauge then applying Juxtalite, is there anything she should be doing?

## 2019-08-12 NOTE — Telephone Encounter (Signed)
If pt wants to be seen, offer him an appt. He likely is swelling enough to cause blisters on his feet (just like his legs used to be before compression use).  The Juxtalite only goes to the ankles, so the feet are likely to continue to swell. He may try to keep legs elevated even while using Juxtalite.  When the swelling improves he should start a Knee High graduated compression stocking under the [place]. If he wants to come in, we can put an UNNA boot on him to get the swelling down.

## 2019-08-12 NOTE — Telephone Encounter (Signed)
Discussed with pt wife discussed Dr Marthann Schiller note we could see patient in the office, pt wife declines today, she will try elevating his legs, then she will get a graduated compression  stocking to use under the [employer]

## 2019-08-13 NOTE — Telephone Encounter (Signed)
Reviewed and noted.

## 2019-08-27 ENCOUNTER — Other Ambulatory Visit: Payer: Self-pay

## 2019-08-27 ENCOUNTER — Ambulatory Visit (INDEPENDENT_AMBULATORY_CARE_PROVIDER_SITE_OTHER): Payer: Medicare Other | Admitting: Dermatology

## 2019-08-27 DIAGNOSIS — L97929 Non-pressure chronic ulcer of unspecified part of left lower leg with unspecified severity: Secondary | ICD-10-CM

## 2019-08-27 DIAGNOSIS — I872 Venous insufficiency (chronic) (peripheral): Secondary | ICD-10-CM

## 2019-08-27 DIAGNOSIS — I83229 Varicose veins of left lower extremity with both ulcer of unspecified site and inflammation: Secondary | ICD-10-CM

## 2019-08-27 DIAGNOSIS — L97919 Non-pressure chronic ulcer of unspecified part of right lower leg with unspecified severity: Secondary | ICD-10-CM

## 2019-08-27 DIAGNOSIS — I83219 Varicose veins of right lower extremity with both ulcer of unspecified site and inflammation: Secondary | ICD-10-CM

## 2019-08-27 MED ORDER — CIPROFLOXACIN HCL 250 MG PO TABS: 250.0000 mg | ORAL_TABLET | Freq: Every day | ORAL | 0 refills | Status: DC

## 2019-08-27 NOTE — Progress Notes (Signed)
   Follow-Up Visit   Subjective  Luis Palmer is a 86 y.o. male who presents for the following: Other (Patient has a history of cellulitis  with edema, ulcers and dyschromia of the legs treated by [place] in the past with excellent clearance with antibiotics; debridement and Unna boots.  He has been using Juxtalite compression system for his legs since, but has had a fluid retention issue recently that exacerbated his legs issue. He has been breaking out in blisters of feet and legs for about 2 weeks. He is retaining fluid and his PCP is adjusting his medication - he sees him tomorrow.). He also sees his cardiologist this week.   The following portions of the chart were reviewed this encounter and updated as appropriate:     Review of Systems: No other skin or systemic complaints.  Objective  Well appearing patient in no apparent distress; mood and affect are within normal limits.  A focused examination was performed including bilateral legs and feet. Relevant physical exam findings are noted in the Assessment and Plan.  Objective  Right Lower Leg - Anterior (2): Severe stasis changes with edema and ulcerations of bilateral lower legs and feet  Images                Assessment & Plan  Venous stasis dermatitis of bilateral lower extremities with Multiple Ulcers; Edema and Cellulitis Right Lower Leg - Anterior All ulcerated areas cleaned and debrided with Puracyn today. Mupirocin and telfa applied followed unna boots x 2 (1 each leg).  Ordered Medications: ciprofloxacin (CIPRO) 250 MG tablet  At least 60 minutes spent in history taking from patient and wife; and evaluation and treatment of patient.  Return in about 1 week (around 09/03/2019).   I, Ashok Cordia, CMA, am acting as scribe for Sarina Ser, MD .

## 2019-09-02 ENCOUNTER — Emergency Department (HOSPITAL_COMMUNITY): Payer: Medicare Other

## 2019-09-02 ENCOUNTER — Other Ambulatory Visit: Payer: Self-pay

## 2019-09-02 ENCOUNTER — Encounter (HOSPITAL_COMMUNITY): Payer: Self-pay

## 2019-09-02 ENCOUNTER — Inpatient Hospital Stay (HOSPITAL_COMMUNITY)
Admission: EM | Admit: 2019-09-02 | Discharge: 2019-09-08 | DRG: 291 | Disposition: A | Discharge status: Home / Self Care | Payer: Medicare Other | Attending: Internal Medicine | Admitting: Internal Medicine

## 2019-09-02 DIAGNOSIS — G8929 Other chronic pain: Secondary | ICD-10-CM | POA: Diagnosis present

## 2019-09-02 DIAGNOSIS — L97522 Non-pressure chronic ulcer of other part of left foot with fat layer exposed: Secondary | ICD-10-CM | POA: Diagnosis present

## 2019-09-02 DIAGNOSIS — M109 Gout, unspecified: Secondary | ICD-10-CM | POA: Diagnosis present

## 2019-09-02 DIAGNOSIS — Z515 Encounter for palliative care: Secondary | ICD-10-CM | POA: Diagnosis not present

## 2019-09-02 DIAGNOSIS — E43 Unspecified severe protein-calorie malnutrition: Secondary | ICD-10-CM | POA: Diagnosis present

## 2019-09-02 DIAGNOSIS — M25561 Pain in right knee: Secondary | ICD-10-CM | POA: Diagnosis present

## 2019-09-02 DIAGNOSIS — Z8249 Family history of ischemic heart disease and other diseases of the circulatory system: Secondary | ICD-10-CM

## 2019-09-02 DIAGNOSIS — I5023 Acute on chronic systolic (congestive) heart failure: Secondary | ICD-10-CM | POA: Diagnosis present

## 2019-09-02 DIAGNOSIS — E1122 Type 2 diabetes mellitus with diabetic chronic kidney disease: Secondary | ICD-10-CM | POA: Diagnosis present

## 2019-09-02 DIAGNOSIS — R531 Weakness: Secondary | ICD-10-CM | POA: Diagnosis present

## 2019-09-02 DIAGNOSIS — L03116 Cellulitis of left lower limb: Secondary | ICD-10-CM | POA: Diagnosis present

## 2019-09-02 DIAGNOSIS — D61818 Other pancytopenia: Secondary | ICD-10-CM | POA: Diagnosis present

## 2019-09-02 DIAGNOSIS — Z20822 Contact with and (suspected) exposure to covid-19: Secondary | ICD-10-CM | POA: Diagnosis present

## 2019-09-02 DIAGNOSIS — Z961 Presence of intraocular lens: Secondary | ICD-10-CM | POA: Diagnosis present

## 2019-09-02 DIAGNOSIS — M7989 Other specified soft tissue disorders: Secondary | ICD-10-CM | POA: Diagnosis not present

## 2019-09-02 DIAGNOSIS — L97322 Non-pressure chronic ulcer of left ankle with fat layer exposed: Secondary | ICD-10-CM | POA: Diagnosis present

## 2019-09-02 DIAGNOSIS — R140 Abdominal distension (gaseous): Secondary | ICD-10-CM | POA: Diagnosis present

## 2019-09-02 DIAGNOSIS — Z95810 Presence of automatic (implantable) cardiac defibrillator: Secondary | ICD-10-CM

## 2019-09-02 DIAGNOSIS — Z951 Presence of aortocoronary bypass graft: Secondary | ICD-10-CM

## 2019-09-02 DIAGNOSIS — I130 Hypertensive heart and chronic kidney disease with heart failure and stage 1 through stage 4 chronic kidney disease, or unspecified chronic kidney disease: Principal | ICD-10-CM | POA: Diagnosis present

## 2019-09-02 DIAGNOSIS — N183 Chronic kidney disease, stage 3 unspecified: Secondary | ICD-10-CM | POA: Diagnosis not present

## 2019-09-02 DIAGNOSIS — I5021 Acute systolic (congestive) heart failure: Secondary | ICD-10-CM | POA: Diagnosis not present

## 2019-09-02 DIAGNOSIS — Z9842 Cataract extraction status, left eye: Secondary | ICD-10-CM

## 2019-09-02 DIAGNOSIS — I081 Rheumatic disorders of both mitral and tricuspid valves: Secondary | ICD-10-CM | POA: Diagnosis present

## 2019-09-02 DIAGNOSIS — G4733 Obstructive sleep apnea (adult) (pediatric): Secondary | ICD-10-CM | POA: Diagnosis present

## 2019-09-02 DIAGNOSIS — J9811 Atelectasis: Secondary | ICD-10-CM | POA: Diagnosis present

## 2019-09-02 DIAGNOSIS — Z6827 Body mass index (BMI) 27.0-27.9, adult: Secondary | ICD-10-CM

## 2019-09-02 DIAGNOSIS — L97512 Non-pressure chronic ulcer of other part of right foot with fat layer exposed: Secondary | ICD-10-CM | POA: Diagnosis present

## 2019-09-02 DIAGNOSIS — L97412 Non-pressure chronic ulcer of right heel and midfoot with fat layer exposed: Secondary | ICD-10-CM | POA: Diagnosis present

## 2019-09-02 DIAGNOSIS — I25810 Atherosclerosis of coronary artery bypass graft(s) without angina pectoris: Secondary | ICD-10-CM | POA: Diagnosis present

## 2019-09-02 DIAGNOSIS — I83009 Varicose veins of unspecified lower extremity with ulcer of unspecified site: Secondary | ICD-10-CM | POA: Diagnosis present

## 2019-09-02 DIAGNOSIS — L97222 Non-pressure chronic ulcer of left calf with fat layer exposed: Secondary | ICD-10-CM | POA: Diagnosis present

## 2019-09-02 DIAGNOSIS — N179 Acute kidney failure, unspecified: Secondary | ICD-10-CM | POA: Diagnosis present

## 2019-09-02 DIAGNOSIS — C189 Malignant neoplasm of colon, unspecified: Secondary | ICD-10-CM | POA: Diagnosis present

## 2019-09-02 DIAGNOSIS — Z833 Family history of diabetes mellitus: Secondary | ICD-10-CM

## 2019-09-02 DIAGNOSIS — Z9841 Cataract extraction status, right eye: Secondary | ICD-10-CM

## 2019-09-02 DIAGNOSIS — I872 Venous insufficiency (chronic) (peripheral): Secondary | ICD-10-CM | POA: Diagnosis present

## 2019-09-02 DIAGNOSIS — N1831 Chronic kidney disease, stage 3a: Secondary | ICD-10-CM | POA: Diagnosis present

## 2019-09-02 DIAGNOSIS — L97422 Non-pressure chronic ulcer of left heel and midfoot with fat layer exposed: Secondary | ICD-10-CM | POA: Diagnosis present

## 2019-09-02 DIAGNOSIS — Z85038 Personal history of other malignant neoplasm of large intestine: Secondary | ICD-10-CM

## 2019-09-02 DIAGNOSIS — I4892 Unspecified atrial flutter: Secondary | ICD-10-CM | POA: Diagnosis present

## 2019-09-02 DIAGNOSIS — E7800 Pure hypercholesterolemia, unspecified: Secondary | ICD-10-CM | POA: Diagnosis present

## 2019-09-02 DIAGNOSIS — Z792 Long term (current) use of antibiotics: Secondary | ICD-10-CM

## 2019-09-02 DIAGNOSIS — L03115 Cellulitis of right lower limb: Secondary | ICD-10-CM | POA: Diagnosis present

## 2019-09-02 DIAGNOSIS — Z7189 Other specified counseling: Secondary | ICD-10-CM

## 2019-09-02 DIAGNOSIS — I2510 Atherosclerotic heart disease of native coronary artery without angina pectoris: Secondary | ICD-10-CM | POA: Diagnosis present

## 2019-09-02 DIAGNOSIS — E785 Hyperlipidemia, unspecified: Secondary | ICD-10-CM | POA: Diagnosis present

## 2019-09-02 DIAGNOSIS — R443 Hallucinations, unspecified: Secondary | ICD-10-CM | POA: Diagnosis present

## 2019-09-02 DIAGNOSIS — R64 Cachexia: Secondary | ICD-10-CM | POA: Diagnosis present

## 2019-09-02 DIAGNOSIS — Z823 Family history of stroke: Secondary | ICD-10-CM

## 2019-09-02 DIAGNOSIS — Z888 Allergy status to other drugs, medicaments and biological substances status: Secondary | ICD-10-CM

## 2019-09-02 DIAGNOSIS — I248 Other forms of acute ischemic heart disease: Secondary | ICD-10-CM | POA: Diagnosis present

## 2019-09-02 DIAGNOSIS — Z66 Do not resuscitate: Secondary | ICD-10-CM | POA: Diagnosis present

## 2019-09-02 DIAGNOSIS — I5082 Biventricular heart failure: Secondary | ICD-10-CM | POA: Diagnosis present

## 2019-09-02 DIAGNOSIS — I4819 Other persistent atrial fibrillation: Secondary | ICD-10-CM | POA: Diagnosis present

## 2019-09-02 DIAGNOSIS — I340 Nonrheumatic mitral (valve) insufficiency: Secondary | ICD-10-CM | POA: Diagnosis not present

## 2019-09-02 DIAGNOSIS — E611 Iron deficiency: Secondary | ICD-10-CM | POA: Diagnosis present

## 2019-09-02 DIAGNOSIS — I255 Ischemic cardiomyopathy: Secondary | ICD-10-CM | POA: Diagnosis present

## 2019-09-02 DIAGNOSIS — Z79899 Other long term (current) drug therapy: Secondary | ICD-10-CM

## 2019-09-02 DIAGNOSIS — I4891 Unspecified atrial fibrillation: Secondary | ICD-10-CM | POA: Diagnosis not present

## 2019-09-02 DIAGNOSIS — E46 Unspecified protein-calorie malnutrition: Secondary | ICD-10-CM | POA: Diagnosis not present

## 2019-09-02 DIAGNOSIS — I509 Heart failure, unspecified: Principal | ICD-10-CM

## 2019-09-02 DIAGNOSIS — I482 Chronic atrial fibrillation, unspecified: Secondary | ICD-10-CM | POA: Diagnosis not present

## 2019-09-02 DIAGNOSIS — D631 Anemia in chronic kidney disease: Secondary | ICD-10-CM | POA: Diagnosis present

## 2019-09-02 DIAGNOSIS — F0390 Unspecified dementia without behavioral disturbance: Secondary | ICD-10-CM | POA: Diagnosis present

## 2019-09-02 DIAGNOSIS — Z7901 Long term (current) use of anticoagulants: Secondary | ICD-10-CM

## 2019-09-02 DIAGNOSIS — I959 Hypotension, unspecified: Secondary | ICD-10-CM | POA: Diagnosis present

## 2019-09-02 DIAGNOSIS — Z886 Allergy status to analgesic agent status: Secondary | ICD-10-CM

## 2019-09-02 DIAGNOSIS — I252 Old myocardial infarction: Secondary | ICD-10-CM

## 2019-09-02 DIAGNOSIS — E119 Type 2 diabetes mellitus without complications: Secondary | ICD-10-CM

## 2019-09-02 DIAGNOSIS — Z87891 Personal history of nicotine dependence: Secondary | ICD-10-CM

## 2019-09-02 DIAGNOSIS — R52 Pain, unspecified: Secondary | ICD-10-CM | POA: Diagnosis not present

## 2019-09-02 LAB — HEPATIC FUNCTION PANEL
ALT: 20 U/L (ref 0–44)
AST: 28 U/L (ref 15–41)
Albumin: 3.4 g/dL — ABNORMAL LOW (ref 3.5–5.0)
Alkaline Phosphatase: 96 U/L (ref 38–126)
Bilirubin, Direct: 0.8 mg/dL — ABNORMAL HIGH (ref 0.0–0.2)
Indirect Bilirubin: 1.2 mg/dL — ABNORMAL HIGH (ref 0.3–0.9)
Total Bilirubin: 2 mg/dL — ABNORMAL HIGH (ref 0.3–1.2)
Total Protein: 6.3 g/dL — ABNORMAL LOW (ref 6.5–8.1)

## 2019-09-02 LAB — CBC WITH DIFFERENTIAL/PLATELET
Abs Immature Granulocytes: 0.02 10*3/uL (ref 0.00–0.07)
Basophils Absolute: 0 10*3/uL (ref 0.0–0.1)
Basophils Relative: 1 %
Eosinophils Absolute: 0.1 10*3/uL (ref 0.0–0.5)
Eosinophils Relative: 2 %
HCT: 39.2 % (ref 39.0–52.0)
Hemoglobin: 12.7 g/dL — ABNORMAL LOW (ref 13.0–17.0)
Immature Granulocytes: 1 %
Lymphocytes Relative: 31 %
Lymphs Abs: 1.2 10*3/uL (ref 0.7–4.0)
MCH: 31.3 pg (ref 26.0–34.0)
MCHC: 32.4 g/dL (ref 30.0–36.0)
MCV: 96.6 fL (ref 80.0–100.0)
Monocytes Absolute: 1 10*3/uL (ref 0.1–1.0)
Monocytes Relative: 26 %
Neutro Abs: 1.5 10*3/uL — ABNORMAL LOW (ref 1.7–7.7)
Neutrophils Relative %: 39 %
Platelets: 129 10*3/uL — ABNORMAL LOW (ref 150–400)
RBC: 4.06 MIL/uL — ABNORMAL LOW (ref 4.22–5.81)
RDW: 17.8 % — ABNORMAL HIGH (ref 11.5–15.5)
WBC: 3.8 10*3/uL — ABNORMAL LOW (ref 4.0–10.5)
nRBC: 0 % (ref 0.0–0.2)

## 2019-09-02 LAB — BASIC METABOLIC PANEL
Anion gap: 12 (ref 5–15)
BUN: 49 mg/dL — ABNORMAL HIGH (ref 8–23)
CO2: 24 mmol/L (ref 22–32)
Calcium: 9.5 mg/dL (ref 8.9–10.3)
Chloride: 94 mmol/L — ABNORMAL LOW (ref 98–111)
Creatinine, Ser: 2.07 mg/dL — ABNORMAL HIGH (ref 0.61–1.24)
GFR calc Af Amer: 33 mL/min — ABNORMAL LOW (ref >60)
GFR calc non Af Amer: 28 mL/min — ABNORMAL LOW (ref >60)
Glucose, Bld: 131 mg/dL — ABNORMAL HIGH (ref 70–99)
Potassium: 3.7 mmol/L (ref 3.5–5.1)
Sodium: 130 mmol/L — ABNORMAL LOW (ref 135–145)

## 2019-09-02 LAB — TROPONIN I (HIGH SENSITIVITY)
Troponin I (High Sensitivity): 27 ng/L — ABNORMAL HIGH (ref <18)
Troponin I (High Sensitivity): 27 ng/L — ABNORMAL HIGH (ref <18)

## 2019-09-02 LAB — BRAIN NATRIURETIC PEPTIDE: B Natriuretic Peptide: >4500 pg/mL — ABNORMAL HIGH (ref 0.0–100.0)

## 2019-09-02 LAB — PROTIME-INR
INR: 4.3 (ref 0.8–1.2)
Prothrombin Time: 41.2 seconds — ABNORMAL HIGH (ref 11.4–15.2)

## 2019-09-02 MED ORDER — FUROSEMIDE 10 MG/ML IJ SOLN
40.0000 mg | Freq: Once | INTRAMUSCULAR | Status: AC
  Administered 2019-09-02: 40 mg via INTRAVENOUS
  Filled 2019-09-02: qty 4

## 2019-09-02 NOTE — ED Provider Notes (Signed)
[place] EMERGENCY DEPARTMENT Provider Note   CSN: [id number] Arrival date & time: 09/02/19  1805     History Chief Complaint  Patient presents with  . Congestive Heart Failure  . Nausea  . Leg Swelling    Council Munguia is a 86 y.o. male.  HPI    86 year old male with a past medical history of atrial fibrillation on Eliquis, CKD stage III, colon cancer, HFrEF (EF 20%), previous MI status post AICD placement, type 2 diabetes, OSA on CPAP presenting to the emergency [facility] complaining of shortness of breath and generalized weakness for the past several weeks acutely worsening over the past 2 days.  Patient reports that he has been holding onto fluid and gaining weight the past several weeks.  Patient reports that he has swelling in his lower extremities extending to his hips proximally.  Patient notes that he has to stop every few words to take a breath before completing sentence.  Patient has increasing dyspnea on exertion.  Patient denies having any accompanying chest pain.  Patient notes that his AICD has not fired.  Patient denies any fevers, chills, cough, congestion, rhinorrhea, abdominal pain, changes in bowel or bladder function.  Patient notes that he is been compliant with his diuretic.  Patient takes torsemide 20 mg twice daily.  Patient notes that he is received his first Covid vaccination shot but not a second.  No known sick contacts.  Patient has chronic lower extremity wounds for which he is receiving wound care.  Past Medical History:  Diagnosis Date  . AICD (automatic cardioverter/defibrillator) present 2006  . Anemia   . Atrial fibrillation ([place])   . CHF (congestive heart failure) ([place])   . CKD (chronic kidney disease), stage III   . Colon cancer ([place])    "dx'd 01/24/2018"  . High cholesterol   . History of blood transfusion 01/22/2018   "I lost blood; HgB extremely low"  . History of gout   . Myocardial infarction ([place]) 1986  . OSA on  CPAP   . Pneumonia 2015  . Type II diabetes mellitus [facility])     Patient Active Problem List   Diagnosis Date Noted  . Acute CHF (congestive heart failure) ([place]) 09/02/2019  . Anemia due to chronic illness 01/08/2019  . Hyperlipidemia 01/08/2019  . Genetic testing 08/03/2018  . Malignant neoplasm of descending colon ([place]) 04/02/2018  . Adenocarcinoma of colon ([place]) 01/25/2018  . Ischemic cardiomyopathy 01/25/2018  . Acute on chronic systolic CHF (congestive heart failure) ([place]) 01/25/2018  . Biventricular implantable cardioverter-defibrillator in situ 01/25/2018  . Gastrointestinal bleeding, lower 01/25/2018  . Acute posthemorrhagic anemia   . Benign neoplasm of transverse colon   . Neoplasm of digestive system   . Melena   . Gastritis without bleeding   . GI bleed 01/21/2018  . Iron deficiency anemia 10/02/2017  . 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 Anticoagulated on warfarin 01/20/2015  . Encounter for adjustment or management of automatic implantable cardioverter-defibrillator 04/07/2014  . Benign essential hypertension 01/20/2014  . Hypertrophy of breast 01/20/2014  . 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 Nocturia 12/03/2012  . Primary cardiomyopathy ([place]) 12/03/2012  . Obstructive sleep apnea 12/03/2012  . Sleep apnea 12/03/2012  . Vitamin D deficiency 09/17/2012  . Edema 06/18/2012  . Type II diabetes mellitus ([place]) 11/28/2011  . Foot pain 11/28/2011  . Pain in soft tissues of limb 11/28/2011  . Weight loss 11/28/2011  . CAD (  coronary artery disease), native coronary artery 11/21/2011  . Congestive heart failure ([place]) 11/21/2011  . Other specified dermatoses 11/08/2011  . Onychomycosis 10/20/2011  . Hammertoe 08/09/2011  . Metatarsal deformity 08/09/2011  . Cavus foot, acquired 07/13/2011  . Neuropathic ulcer ([place]) 07/13/2011  . Persistent atrial fibrillation 03/31/2011    Past Surgical History:    Procedure Laterality Date  . APPENDECTOMY    . ATRIAL FIBRILLATION ABLATION  X 2  . CARDIAC CATHETERIZATION    . CARDIAC DEFIBRILLATOR PLACEMENT  2006   "added lead and had battery changed once since the initial OR" (01/29/2018)  . CATARACT EXTRACTION W/ INTRAOCULAR LENS  IMPLANT, BILATERAL Bilateral   . COLONOSCOPY WITH PROPOFOL N/A 01/23/2018   Procedure: COLONOSCOPY WITH PROPOFOL;  Surgeon: Lucilla Lame, MD;  Location: [facility] ENDOSCOPY;  Service: Endoscopy;  Laterality: N/A;  . CORONARY ARTERY BYPASS GRAFT  1986   "CABG X2"  . CYSTOSCOPY N/A 01/30/2018   Procedure: CYSTOSCOPY FLEXIBLE WITH INSERTION OF CATHETER;  Surgeon: Jovita Kussmaul, MD;  Location: [facility];  Service: General;  Laterality: N/A;  . ESOPHAGOGASTRODUODENOSCOPY (EGD) WITH PROPOFOL N/A 01/22/2018   Procedure: ESOPHAGOGASTRODUODENOSCOPY (EGD) WITH PROPOFOL;  Surgeon: Lucilla Lame, MD;  Location: [facility] ENDOSCOPY;  Service: Endoscopy;  Laterality: N/A;  . INSERT / REPLACE / REMOVE PACEMAKER    . LAPAROSCOPIC PARTIAL COLECTOMY N/A 01/30/2018   Procedure: LAPAROSCOPIC ASSISTED EXTENDED RIGHT COLECTOMY;  Surgeon: Jovita Kussmaul, MD;  Location: [facility] OR;  Service: General;  Laterality: N/A;  . NASAL SINUS SURGERY         Family History  Problem Relation Age of Onset  . Congestive Heart Failure Mother   . Diabetes Father   . Stroke Father   . Lung cancer Brother 35  . Skin cancer Brother        squamous cell  . Heart attack Maternal Uncle     Social History   Tobacco Use  . Smoking status: Former Smoker    Packs/day: 1.00    Years: 40.00    Pack years: 40.00    Types: Cigarettes    Quit date: 1986    Years since quitting: 35.2  . Smokeless tobacco: Never Used  Substance Use Topics  . Alcohol use: Not Currently  . Drug use: Never    Home Medications Prior to Admission medications   Medication Sig Start Date End Date Taking? Authorizing Provider  acetaminophen (TYLENOL) 500 MG tablet Take 2 tablets (1,000 mg total) by  mouth every 8 (eight) hours. Patient taking differently: Take 1,000 mg by mouth daily as needed for headache (pain).  02/12/18  Yes Cristal Deer, MD  canagliflozin (INVOKANA) 100 MG TABS tablet Take by mouth. 04/24/19 04/23/20 Yes [provider]  ciprofloxacin (CIPRO) 250 MG tablet Take 1 tablet (250 mg total) by mouth daily. 08/27/19  Yes Ralene Bathe, MD  donepezil (ARICEPT) 5 MG tablet Take 5 mg by mouth at bedtime.  06/11/19  Yes [provider]  ezetimibe (ZETIA) 10 MG tablet Take 10 mg by mouth daily.    Yes [provider]  guaiFENesin (MUCINEX PO) Take 1 tablet by mouth 2 (two) times daily.   Yes [provider]  hydrocortisone 2.5 % cream Apply 1 application topically daily as needed (rash/irritation).  06/06/19  Yes [provider]  latanoprost (XALATAN) 0.005 % ophthalmic solution Place 1 drop into both eyes at bedtime.  12/14/18  Yes [provider]  Multiple Vitamin (MULTIVITAMIN WITH MINERALS) TABS tablet Take 1 tablet  by mouth daily. 02/13/18  Yes Cristal Deer, MD  Multiple Vitamins-Minerals (ZINC PO) Take 1 tablet by mouth daily.   Yes [provider]  rivaroxaban (XARELTO) 10 MG TABS tablet Take 10 mg by mouth daily.   Yes [provider]  rosuvastatin (CRESTOR) 10 MG tablet Take 10 mg by mouth at bedtime.  12/30/17  Yes [provider]  sacubitril-valsartan (ENTRESTO) 24-26 MG Take 1 tablet by mouth 2 (two) times daily.   Yes [provider]  sodium chloride (OCEAN) 0.65 % nasal spray Place 1 spray into the nose as needed for congestion.  06/12/19  Yes [provider]  tamsulosin (FLOMAX) 0.4 MG CAPS capsule Take 1 capsule (0.4 mg total) by mouth at bedtime. 02/12/18  Yes Cristal Deer, MD  torsemide (DEMADEX) 20 MG tablet Take 30 mg by mouth daily.  06/10/19  Yes [provider]  VITAMIN D PO Take 1 capsule by mouth daily.    Yes [provider]     Allergies    Pseudoephedrine hcl, Ace inhibitors, Nsaids, and Vasotec [enalapril]  Review of Systems   Review of Systems  Constitutional: Positive for activity change and fatigue. Negative for appetite change, chills, diaphoresis and fever.  HENT: Negative for congestion and rhinorrhea.   Respiratory: Positive for shortness of breath. Negative for cough and wheezing.   Cardiovascular: Positive for leg swelling. Negative for chest pain and palpitations.  Gastrointestinal: Negative for abdominal distention, abdominal pain, diarrhea, nausea and vomiting.  Genitourinary: Negative for decreased urine volume, difficulty urinating, dysuria, frequency and urgency.  Musculoskeletal: Negative for gait problem.  Skin: Negative for color change and wound.  Neurological: Negative for dizziness, syncope, weakness, light-headedness and headaches.  All other systems reviewed and are negative.   Physical Exam Updated Vital Signs BP 103/66   Pulse 88   Temp 97.8 F (36.6 C) (Oral)   Resp 15   Ht 6' (1.829 m)   Wt 93.4 kg   SpO2 91%   BMI 27.94 kg/m   Physical Exam Vitals and nursing note reviewed.  Constitutional:      General: He is not in acute distress.    Appearance: Normal appearance. He is normal weight. He is not ill-appearing.  HENT:     Head: Normocephalic.     Right Ear: External ear normal.     Left Ear: External ear normal.     Nose: Nose normal.     Mouth/Throat:     Mouth: Mucous membranes are moist.     Pharynx: Oropharynx is clear.  Eyes:     Extraocular Movements: Extraocular movements intact.  Neck:     Vascular: Hepatojugular reflux and JVD present.  Cardiovascular:     Rate and Rhythm: Normal rate. Rhythm irregularly irregular.     Pulses: Normal pulses.     Heart sounds: Normal heart sounds.  Pulmonary:     Effort: Pulmonary effort is normal. Tachypnea present. No accessory muscle usage, respiratory distress or retractions.     Breath sounds: Decreased  air movement present. Examination of the right-lower field reveals decreased breath sounds. Examination of the left-lower field reveals decreased breath sounds. Decreased breath sounds present. No wheezing, rhonchi or rales.  Abdominal:     General: Bowel sounds are normal.     Palpations: Abdomen is soft.     Tenderness: There is no abdominal tenderness. There is no guarding.  Musculoskeletal:     Cervical back: Normal range of motion.     Right lower  leg: Edema present.     Left lower leg: Edema present.     Comments: Pitting edema to the proximal lower extremities  Skin:    General: Skin is warm and dry.     Capillary Refill: Capillary refill takes less than 2 seconds.  Neurological:     General: No focal deficit present.     Mental Status: He is alert and oriented to person, place, and time. Mental status is at baseline.  Psychiatric:        Mood and Affect: Mood normal.     ED Results / Procedures / Treatments   Labs (all labs ordered are listed, but only abnormal results are displayed) Labs Reviewed  CBC WITH DIFFERENTIAL/PLATELET - Abnormal; Notable for the following components:      Result Value   WBC 3.8 (*)    RBC 4.06 (*)    Hemoglobin 12.7 (*)    RDW 17.8 (*)    Platelets 129 (*)    Neutro Abs 1.5 (*)    All other components within normal limits  BRAIN NATRIURETIC PEPTIDE - Abnormal; Notable for the following components:   B Natriuretic Peptide >4,500.0 (*)    All other components within normal limits  BASIC METABOLIC PANEL - Abnormal; Notable for the following components:   Sodium 130 (*)    Chloride 94 (*)    Glucose, Bld 131 (*)    BUN 49 (*)    Creatinine, Ser 2.07 (*)    GFR calc non Af Amer 28 (*)    GFR calc Af Amer 33 (*)    All other components within normal limits  HEPATIC FUNCTION PANEL - Abnormal; Notable for the following components:   Total Protein 6.3 (*)    Albumin 3.4 (*)    Total Bilirubin 2.0 (*)    Bilirubin, Direct 0.8 (*)     Indirect Bilirubin 1.2 (*)    All other components within normal limits  PROTIME-INR - Abnormal; Notable for the following components:   Prothrombin Time 41.2 (*)    INR 4.3 (*)    All other components within normal limits  TROPONIN I (HIGH SENSITIVITY) - Abnormal; Notable for the following components:   Troponin I (High Sensitivity) 27 (*)    All other components within normal limits  TROPONIN I (HIGH SENSITIVITY) - Abnormal; Notable for the following components:   Troponin I (High Sensitivity) 27 (*)    All other components within normal limits    EKG EKG Interpretation  Date/Time:  Monday September 02 2019 18:47:37 EDT Ventricular Rate:  79 PR Interval:    QRS Duration: 165 QT Interval:  446 QTC Calculation: 512 R Axis:   -179 Text Interpretation: VENTRICULAR PACED RHYTHM Similar to prior. Confirmed by Nanda Quinton [phone]) on 09/02/2019 6:58:01 PM   Radiology DG Chest Portable 1 View  Result Date: 09/02/2019 CLINICAL DATA:  Shortness of breath EXAM: PORTABLE CHEST 1 VIEW COMPARISON:  01/31/2018 FINDINGS: Left AICD remains in place, unchanged. Cardiomegaly, prior CABG. Small right pleural effusion. Bibasilar opacities, likely atelectasis. Mild vascular congestion. No overt edema. IMPRESSION: Cardiomegaly, vascular congestion. Small right pleural effusion.  Bibasilar atelectasis. Electronically Signed   By: Rolm Baptise M.D.   On: 09/02/2019 19:08    Procedures Procedures (including critical care time)  Medications Ordered in ED Medications  furosemide (LASIX) injection 40 mg (40 mg Intravenous Given 09/02/19 2157)    ED Course  I have reviewed the triage vital signs and the nursing notes.  Pertinent labs &  imaging results that were available during my care of the patient were reviewed by me and considered in my medical decision making (see chart for details).    MDM Rules/Calculators/A&P                      86 year old male with a past medical history of atrial  fibrillation on Eliquis, CKD stage III, colon cancer, HFrEF (EF 20%), previous MI status post AICD placement, type 2 diabetes, OSA on CPAP presenting to the emergency [facility] complaining of shortness of breath and generalized weakness for the past several weeks acutely worsening over the past 2 days.  Differential diagnoses considered include acute on chronic heart failure with reduced ejection fraction exacerbation, dysrhythmia, ACS, pneumonia less likely AICD malfunction, doubt PE or PTX, peritonitis or myocarditis  ECG interpreted by me demonstrated atrially paced rhythm at 79 bpm, low voltage, appropriate LBBB pattern without Sgarbossa criteria or other signs of ischemia  CXR interpreted by me demonstrated small right pleural effusion with bibasilar atelectasis  Labs demonstrated no significant leukocytosis, mild leukopenia 3.8, normocytic anemia to 12.7 with an MCV of 96.6 appears improved compared to previous, thrombocytopenia 129, initial troponin 27 unchanged on delta, mild hyponatremia 130, mild hypochloremia 94, BUN elevated 49 with a creatinine of 2.07 which appears in increased compared to previous suggesting likely prerenal AKI, INR elevated at 4.3, BNP elevated to >4500 significantly elevated compared to previous strongly suggesting acute on chronic HFrEF exacerbation. Will diurese with 40 mg IV lasix.   We will admit for acute on chronic HFrEF exacerbation requiring further evaluation, ongoing monitoring and management.   Care of this patient was discussed with Dr. Ashok Cordia, who directly supervised the care of this patient.  Final Clinical Impression(s) / ED Diagnoses Final diagnoses:  None    Rx / DC Orders ED Discharge Orders    None       Filbert Berthold, MD 09/02/19 6010    Lajean Saver, MD 09/03/19 1322

## 2019-09-02 NOTE — ED Triage Notes (Signed)
Pt BIB [facility] from home. Pt has had Nausea for about 3 weeks due to decreased appetite from ADB distension.   Pt has Hx of CHF and takes Torsemide  Pt has decreased urine output (100-250 ml).  Pt had odd feeling around 1600 similar to when he had past MI.   Pt denies SOB but has increased RR at 36  VSS

## 2019-09-03 ENCOUNTER — Telehealth: Payer: Self-pay

## 2019-09-03 ENCOUNTER — Encounter (HOSPITAL_COMMUNITY): Payer: Self-pay | Admitting: Internal Medicine

## 2019-09-03 ENCOUNTER — Inpatient Hospital Stay (HOSPITAL_COMMUNITY): Payer: Medicare Other

## 2019-09-03 ENCOUNTER — Inpatient Hospital Stay: Payer: Self-pay

## 2019-09-03 ENCOUNTER — Ambulatory Visit: Payer: Medicare Other | Admitting: Dermatology

## 2019-09-03 DIAGNOSIS — I482 Chronic atrial fibrillation, unspecified: Secondary | ICD-10-CM | POA: Diagnosis not present

## 2019-09-03 DIAGNOSIS — D61818 Other pancytopenia: Secondary | ICD-10-CM | POA: Diagnosis not present

## 2019-09-03 DIAGNOSIS — I5023 Acute on chronic systolic (congestive) heart failure: Secondary | ICD-10-CM | POA: Diagnosis not present

## 2019-09-03 DIAGNOSIS — I5021 Acute systolic (congestive) heart failure: Secondary | ICD-10-CM | POA: Diagnosis not present

## 2019-09-03 DIAGNOSIS — N183 Chronic kidney disease, stage 3 unspecified: Secondary | ICD-10-CM | POA: Diagnosis not present

## 2019-09-03 DIAGNOSIS — I509 Heart failure, unspecified: Secondary | ICD-10-CM

## 2019-09-03 LAB — CBC WITH DIFFERENTIAL/PLATELET
Abs Immature Granulocytes: 0.03 10*3/uL (ref 0.00–0.07)
Basophils Absolute: 0 10*3/uL (ref 0.0–0.1)
Basophils Relative: 1 %
Eosinophils Absolute: 0 10*3/uL (ref 0.0–0.5)
Eosinophils Relative: 1 %
HCT: 38.3 % — ABNORMAL LOW (ref 39.0–52.0)
Hemoglobin: 12.2 g/dL — ABNORMAL LOW (ref 13.0–17.0)
Immature Granulocytes: 1 %
Lymphocytes Relative: 17 %
Lymphs Abs: 0.9 10*3/uL (ref 0.7–4.0)
MCH: 31.1 pg (ref 26.0–34.0)
MCHC: 31.9 g/dL (ref 30.0–36.0)
MCV: 97.7 fL (ref 80.0–100.0)
Monocytes Absolute: 1.7 10*3/uL — ABNORMAL HIGH (ref 0.1–1.0)
Monocytes Relative: 33 %
Neutro Abs: 2.5 10*3/uL (ref 1.7–7.7)
Neutrophils Relative %: 47 %
Platelets: 124 10*3/uL — ABNORMAL LOW (ref 150–400)
RBC: 3.92 MIL/uL — ABNORMAL LOW (ref 4.22–5.81)
RDW: 18 % — ABNORMAL HIGH (ref 11.5–15.5)
WBC: 5.1 10*3/uL (ref 4.0–10.5)
nRBC: 0 % (ref 0.0–0.2)

## 2019-09-03 LAB — CBG MONITORING, ED
Glucose-Capillary: 108 mg/dL — ABNORMAL HIGH (ref 70–99)
Glucose-Capillary: 115 mg/dL — ABNORMAL HIGH (ref 70–99)

## 2019-09-03 LAB — GLUCOSE, CAPILLARY
Glucose-Capillary: 127 mg/dL — ABNORMAL HIGH (ref 70–99)
Glucose-Capillary: 139 mg/dL — ABNORMAL HIGH (ref 70–99)

## 2019-09-03 LAB — COMPREHENSIVE METABOLIC PANEL
ALT: 31 U/L (ref 0–44)
AST: 46 U/L — ABNORMAL HIGH (ref 15–41)
Albumin: 3.1 g/dL — ABNORMAL LOW (ref 3.5–5.0)
Alkaline Phosphatase: 83 U/L (ref 38–126)
Anion gap: 16 — ABNORMAL HIGH (ref 5–15)
BUN: 50 mg/dL — ABNORMAL HIGH (ref 8–23)
CO2: 23 mmol/L (ref 22–32)
Calcium: 9.4 mg/dL (ref 8.9–10.3)
Chloride: 97 mmol/L — ABNORMAL LOW (ref 98–111)
Creatinine, Ser: 2.09 mg/dL — ABNORMAL HIGH (ref 0.61–1.24)
GFR calc Af Amer: 32 mL/min — ABNORMAL LOW (ref >60)
GFR calc non Af Amer: 28 mL/min — ABNORMAL LOW (ref >60)
Glucose, Bld: 107 mg/dL — ABNORMAL HIGH (ref 70–99)
Potassium: 3.8 mmol/L (ref 3.5–5.1)
Sodium: 136 mmol/L (ref 135–145)
Total Bilirubin: 2.2 mg/dL — ABNORMAL HIGH (ref 0.3–1.2)
Total Protein: 6 g/dL — ABNORMAL LOW (ref 6.5–8.1)

## 2019-09-03 LAB — ECHOCARDIOGRAM COMPLETE
Height: 72 in
Weight: 3296 [oz_av]

## 2019-09-03 LAB — MRSA PCR SCREENING: MRSA by PCR: NEGATIVE

## 2019-09-03 LAB — SARS CORONAVIRUS 2 (TAT 6-24 HRS): SARS Coronavirus 2: NEGATIVE

## 2019-09-03 MED ORDER — FUROSEMIDE 10 MG/ML IJ SOLN
40.0000 mg | Freq: Two times a day (BID) | INTRAMUSCULAR | Status: DC
  Administered 2019-09-03 – 2019-09-04 (×3): 40 mg via INTRAVENOUS
  Filled 2019-09-03 (×3): qty 4

## 2019-09-03 MED ORDER — ONDANSETRON HCL 4 MG PO TABS: 4.0000 mg | ORAL_TABLET | Freq: Four times a day (QID) | ORAL | Status: DC | PRN

## 2019-09-03 MED ORDER — INSULIN ASPART 100 UNIT/ML ~~LOC~~ SOLN
0.0000 [IU] | Freq: Three times a day (TID) | SUBCUTANEOUS | Status: DC
  Administered 2019-09-04: 1 [IU] via SUBCUTANEOUS
  Administered 2019-09-05 – 2019-09-07 (×2): 3 [IU] via SUBCUTANEOUS
  Administered 2019-09-07 – 2019-09-08 (×4): 1 [IU] via SUBCUTANEOUS

## 2019-09-03 MED ORDER — PERFLUTREN LIPID MICROSPHERE
INTRAVENOUS | Status: AC
  Filled 2019-09-03: qty 10

## 2019-09-03 MED ORDER — DONEPEZIL HCL 10 MG PO TABS
5.0000 mg | ORAL_TABLET | Freq: Every day | ORAL | Status: DC
  Administered 2019-09-03 – 2019-09-07 (×5): 5 mg via ORAL
  Filled 2019-09-03 (×6): qty 1

## 2019-09-03 MED ORDER — ACETAMINOPHEN 325 MG PO TABS
650.0000 mg | ORAL_TABLET | Freq: Four times a day (QID) | ORAL | Status: DC | PRN
  Administered 2019-09-05: 650 mg via ORAL
  Filled 2019-09-03: qty 2

## 2019-09-03 MED ORDER — TAMSULOSIN HCL 0.4 MG PO CAPS
0.4000 mg | ORAL_CAPSULE | Freq: Every day | ORAL | Status: DC
  Administered 2019-09-03 – 2019-09-07 (×5): 0.4 mg via ORAL
  Filled 2019-09-03 (×5): qty 1

## 2019-09-03 MED ORDER — ACETAMINOPHEN 650 MG RE SUPP: 650.0000 mg | Freq: Four times a day (QID) | RECTAL | Status: DC | PRN

## 2019-09-03 MED ORDER — LATANOPROST 0.005 % OP SOLN
1.0000 [drp] | Freq: Every day | OPHTHALMIC | Status: DC
  Administered 2019-09-03 – 2019-09-07 (×5): 1 [drp] via OPHTHALMIC
  Filled 2019-09-03: qty 2.5

## 2019-09-03 MED ORDER — RIVAROXABAN 10 MG PO TABS
10.0000 mg | ORAL_TABLET | Freq: Every day | ORAL | Status: DC
  Administered 2019-09-03: 10 mg via ORAL
  Filled 2019-09-03 (×2): qty 1

## 2019-09-03 MED ORDER — ENSURE ENLIVE PO LIQD
237.0000 mL | Freq: Two times a day (BID) | ORAL | Status: DC
  Administered 2019-09-04 – 2019-09-08 (×7): 237 mL via ORAL

## 2019-09-03 MED ORDER — EZETIMIBE 10 MG PO TABS
10.0000 mg | ORAL_TABLET | Freq: Every day | ORAL | Status: DC
  Administered 2019-09-03 – 2019-09-08 (×6): 10 mg via ORAL
  Filled 2019-09-03 (×6): qty 1

## 2019-09-03 MED ORDER — ADULT MULTIVITAMIN W/MINERALS CH
1.0000 | ORAL_TABLET | Freq: Every day | ORAL | Status: DC
  Administered 2019-09-04 – 2019-09-08 (×5): 1 via ORAL
  Filled 2019-09-03 (×6): qty 1

## 2019-09-03 MED ORDER — ONDANSETRON HCL 4 MG/2ML IJ SOLN: 4.0000 mg | Freq: Four times a day (QID) | INTRAMUSCULAR | Status: DC | PRN

## 2019-09-03 MED ORDER — PERFLUTREN LIPID MICROSPHERE
1.0000 mL | INTRAVENOUS | Status: AC | PRN
  Administered 2019-09-03: 3 mL via INTRAVENOUS
  Filled 2019-09-03: qty 10

## 2019-09-03 MED ORDER — CIPROFLOXACIN HCL 500 MG PO TABS
250.0000 mg | ORAL_TABLET | Freq: Two times a day (BID) | ORAL | Status: DC
  Administered 2019-09-03 – 2019-09-08 (×11): 250 mg via ORAL
  Filled 2019-09-03 (×11): qty 1

## 2019-09-03 MED ORDER — SACUBITRIL-VALSARTAN 24-26 MG PO TABS
1.0000 | ORAL_TABLET | Freq: Two times a day (BID) | ORAL | Status: DC
  Filled 2019-09-03: qty 1

## 2019-09-03 MED ORDER — ROSUVASTATIN CALCIUM 5 MG PO TABS
10.0000 mg | ORAL_TABLET | Freq: Every day | ORAL | Status: DC
  Administered 2019-09-03 – 2019-09-07 (×5): 10 mg via ORAL
  Filled 2019-09-03 (×5): qty 2

## 2019-09-03 NOTE — H&P (Signed)
History and Physical    Luis Palmer HER:[id number] DOB: 1933/01/25 DOA: 09/02/2019  PCP: Idelle Crouch, MD  Patient coming from: Home.  Chief Complaint: Shortness of breath.  HPI: Luis Palmer is a 86 y.o. male with history of chronic systolic heart failure status post AICD placement usually follows with [facility], chronic kidney disease stage IV, diabetes mellitus type 2, colon cancer status post resection presents to the ER because of increasing shortness of breath and worsening peripheral edema.  Over the last 2 months patient states torsemide dose has been changed frequently and despite which patient has been getting more edematous.  Patient also has been getting easily short of breath.  Denies any chest pain.  ED Course: In the ER on exam patient has significant edema extending all through up to the thigh bilateral lower extremity and abdominal distention.  Chest x-ray shows features consistent with fluid overload.  Labs show BNP of more than 4500 WBC count 3.8 hemoglobin 12.7 platelets 129 creatinine 2 sodium 130 total bilirubin 2 high sensitive troponin was 27.  Covid test is negative.  Patient started on Lasix 40 mg IV and admitted for further management of acute on chronic systolic heart failure.  Review of Systems: As per HPI, rest all negative.   Past Medical History:  Diagnosis Date  . AICD (automatic cardioverter/defibrillator) present 2006  . Anemia   . Atrial fibrillation ([place])   . CHF (congestive heart failure) ([place])   . CKD (chronic kidney disease), stage III   . Colon cancer ([place])    "dx'd 01/24/2018"  . High cholesterol   . History of blood transfusion 01/22/2018   "I lost blood; HgB extremely low"  . History of gout   . Myocardial infarction ([place]) 1986  . OSA on CPAP   . Pneumonia 2015  . Type II diabetes mellitus ([place])     Past Surgical History:  Procedure Laterality Date  . APPENDECTOMY    . ATRIAL FIBRILLATION ABLATION  X 2  .  CARDIAC CATHETERIZATION    . CARDIAC DEFIBRILLATOR PLACEMENT  2006   "added lead and had battery changed once since the initial OR" (01/29/2018)  . CATARACT EXTRACTION W/ INTRAOCULAR LENS  IMPLANT, BILATERAL Bilateral   . COLONOSCOPY WITH PROPOFOL N/A 01/23/2018   Procedure: COLONOSCOPY WITH PROPOFOL;  Surgeon: Lucilla Lame, MD;  Location: [facility] ENDOSCOPY;  Service: Endoscopy;  Laterality: N/A;  . CORONARY ARTERY BYPASS GRAFT  1986   "CABG X2"  . CYSTOSCOPY N/A 01/30/2018   Procedure: CYSTOSCOPY FLEXIBLE WITH INSERTION OF CATHETER;  Surgeon: Jovita Kussmaul, MD;  Location: [place];  Service: General;  Laterality: N/A;  . ESOPHAGOGASTRODUODENOSCOPY (EGD) WITH PROPOFOL N/A 01/22/2018   Procedure: ESOPHAGOGASTRODUODENOSCOPY (EGD) WITH PROPOFOL;  Surgeon: Lucilla Lame, MD;  Location: [facility] ENDOSCOPY;  Service: Endoscopy;  Laterality: N/A;  . INSERT / REPLACE / REMOVE PACEMAKER    . LAPAROSCOPIC PARTIAL COLECTOMY N/A 01/30/2018   Procedure: LAPAROSCOPIC ASSISTED EXTENDED RIGHT COLECTOMY;  Surgeon: Jovita Kussmaul, MD;  Location: [place];  Service: General;  Laterality: N/A;  . NASAL SINUS SURGERY       reports that he quit smoking about 35 years ago. His smoking use included cigarettes. He has a 40.00 pack-year smoking history. He has never used smokeless tobacco. He reports previous alcohol use. He reports that he does not use drugs.  Allergies  Allergen Reactions  . Pseudoephedrine Hcl Other (See Comments)    Other Reaction: ANURIA  . Ace Inhibitors Other (  See Comments) and Cough  . Nsaids Other (See Comments)    Reaction:  Fluid retention   . Vasotec [Enalapril] Cough    Family History  Problem Relation Age of Onset  . Congestive Heart Failure Mother   . Diabetes Father   . Stroke Father   . Lung cancer Brother 27  . Skin cancer Brother        squamous cell  . Heart attack Maternal Uncle     Prior to Admission medications   Medication Sig Start Date End Date Taking? Authorizing Provider    acetaminophen (TYLENOL) 500 MG tablet Take 2 tablets (1,000 mg total) by mouth every 8 (eight) hours. Patient taking differently: Take 1,000 mg by mouth daily as needed for headache (pain).  02/12/18  Yes Cristal Deer, MD  canagliflozin (INVOKANA) 100 MG TABS tablet Take by mouth. 04/24/19 04/23/20 Yes [provider]  ciprofloxacin (CIPRO) 250 MG tablet Take 1 tablet (250 mg total) by mouth daily. 08/27/19  Yes Ralene Bathe, MD  donepezil (ARICEPT) 5 MG tablet Take 5 mg by mouth at bedtime.  06/11/19  Yes [provider]  ezetimibe (ZETIA) 10 MG tablet Take 10 mg by mouth daily.    Yes [provider]  guaiFENesin (MUCINEX PO) Take 1 tablet by mouth 2 (two) times daily.   Yes [provider]  hydrocortisone 2.5 % cream Apply 1 application topically daily as needed (rash/irritation).  06/06/19  Yes [provider]  latanoprost (XALATAN) 0.005 % ophthalmic solution Place 1 drop into both eyes at bedtime.  12/14/18  Yes [provider]  Multiple Vitamin (MULTIVITAMIN WITH MINERALS) TABS tablet Take 1 tablet by mouth daily. 02/13/18  Yes Cristal Deer, MD  Multiple Vitamins-Minerals (ZINC PO) Take 1 tablet by mouth daily.   Yes [provider]  rivaroxaban (XARELTO) 10 MG TABS tablet Take 10 mg by mouth daily.   Yes [provider]  rosuvastatin (CRESTOR) 10 MG tablet Take 10 mg by mouth at bedtime.  12/30/17  Yes [provider]  sacubitril-valsartan (ENTRESTO) 24-26 MG Take 1 tablet by mouth 2 (two) times daily.   Yes [provider]  sodium chloride (OCEAN) 0.65 % nasal spray Place 1 spray into the nose as needed for congestion.  06/12/19  Yes [provider]  tamsulosin (FLOMAX) 0.4 MG CAPS capsule Take 1 capsule (0.4 mg total) by mouth at bedtime. 02/12/18  Yes Cristal Deer, MD  torsemide (DEMADEX) 20 MG tablet Take 30 mg by mouth daily.  06/10/19  Yes [provider]  VITAMIN D PO  Take 1 capsule by mouth daily.    Yes [provider]    Physical Exam: Constitutional: Moderately built and nourished. Vitals:   09/02/19 2345 09/03/19 0000 09/03/19 0030 09/03/19 0045  BP: 96/67 102/64 98/60 103/67  Pulse: 76 69 77 79  Resp: 20 12 11 18   Temp:      TempSrc:      SpO2: 94% 95% 95% 92%  Weight:      Height:       Eyes: Nonicteric no pallor. ENMT: No discharge from the ears eyes nose or mouth. Neck: Elevated JVD no mass felt. Respiratory: No rhonchi or crepitations. Cardiovascular: S1-S2 heard. Abdomen: Mildly distended nontender bowel sounds present. Musculoskeletal: Bilateral lower extremity edema extending up to the thigh. Skin: Has bilateral lower extremity wounds for which patient has dressing. Neurologic: Alert awake oriented time place and person.  Moves all extremities. Psychiatric: Normal mood normal affect.  Labs on Admission: I have personally reviewed following labs and imaging studies  CBC: Recent Labs  Lab 09/02/19 1823  WBC 3.8*  NEUTROABS 1.5*  HGB 12.7*  HCT 39.2  MCV 96.6  PLT 381*   Basic Metabolic Panel: Recent Labs  Lab 09/02/19 1823  NA 130*  K 3.7  CL 94*  CO2 24  GLUCOSE 131*  BUN 49*  CREATININE 2.07*  CALCIUM 9.5   GFR: Estimated Creatinine Clearance: 31 mL/min (A) (by C-G formula based on SCr of 2.07 mg/dL (H)). Liver Function Tests: Recent Labs  Lab 09/02/19 1823  AST 28  ALT 20  ALKPHOS 96  BILITOT 2.0*  PROT 6.3*  ALBUMIN 3.4*   No results for input(s): LIPASE, AMYLASE in the last 168 hours. No results for input(s): AMMONIA in the last 168 hours. Coagulation Profile: Recent Labs  Lab 09/02/19 1823  INR 4.3*   Cardiac Enzymes: No results for input(s): CKTOTAL, CKMB, CKMBINDEX, TROPONINI in the last 168 hours. BNP (last 3 results) No results for input(s): PROBNP in the last 8760 hours. HbA1C: No results for input(s): HGBA1C in the last 72 hours. CBG: No results for input(s): GLUCAP  in the last 168 hours. Lipid Profile: No results for input(s): CHOL, HDL, LDLCALC, TRIG, CHOLHDL, LDLDIRECT in the last 72 hours. Thyroid Function Tests: No results for input(s): TSH, T4TOTAL, FREET4, T3FREE, THYROIDAB in the last 72 hours. Anemia Panel: No results for input(s): VITAMINB12, FOLATE, FERRITIN, TIBC, IRON, RETICCTPCT in the last 72 hours. Urine analysis:    Component Value Date/Time   COLORURINE YELLOW (A) 01/21/2018 1121   APPEARANCEUR CLEAR (A) 01/21/2018 1121   APPEARANCEUR Clear 08/04/2013 1256   LABSPEC 1.009 01/21/2018 1121   LABSPEC 1.012 08/04/2013 1256   PHURINE 5.0 01/21/2018 1121   GLUCOSEU NEGATIVE 01/21/2018 1121   GLUCOSEU 50 mg/dL 08/04/2013 1256   HGBUR NEGATIVE 01/21/2018 1121   BILIRUBINUR NEGATIVE 01/21/2018 1121   BILIRUBINUR Negative 08/04/2013 1256   KETONESUR NEGATIVE 01/21/2018 1121   PROTEINUR NEGATIVE 01/21/2018 1121   NITRITE NEGATIVE 01/21/2018 1121   LEUKOCYTESUR NEGATIVE 01/21/2018 1121   LEUKOCYTESUR Negative 08/04/2013 1256   Sepsis Labs: @LABRCNTIP (procalcitonin:4,lacticidven:4) )No results found for this or any previous visit (from the past 240 hour(s)).   Radiological Exams on Admission: DG Chest Portable 1 View  Result Date: 09/02/2019 CLINICAL DATA:  Shortness of breath EXAM: PORTABLE CHEST 1 VIEW COMPARISON:  01/31/2018 FINDINGS: Left AICD remains in place, unchanged. Cardiomegaly, prior CABG. Small right pleural effusion. Bibasilar opacities, likely atelectasis. Mild vascular congestion. No overt edema. IMPRESSION: Cardiomegaly, vascular congestion. Small right pleural effusion.  Bibasilar atelectasis. Electronically Signed   By: Rolm Baptise M.D.   On: 09/02/2019 19:08    EKG: Independently reviewed.  Paced rhythm.  Assessment/Plan Principal Problem:   Acute on chronic systolic CHF (congestive heart failure) (HCC) Active Problems:   Adenocarcinoma of colon (HCC)   Biventricular implantable cardioverter-defibrillator in  situ   Type II diabetes mellitus (HCC)   CKD (chronic kidney disease) stage 3, GFR 30-59 ml/min   Pancytopenia (HCC)   CHF (congestive heart failure) ([place])    1. Acute on chronic systolic heart failure per records last EF was around 30% status post AICD placement presently on Lasix 40 mg IV every 12 patient is also on Entresto.  Closely follow intake output metabolic panel daily weights. 2. Chronic and disease stage IV creatinine appears to be at baseline.  Note that patient is presently on IV Lasix and Entresto.  Also note  that patient is on Xarelto dose of which may need to be changed if creatinine worsens. 3. Pancytopenia appears to be chronic follow CBC. 4. Diabetes mellitus type 2 on sliding scale coverage. 5. CAD denies any chest pain.  On Zetia and Xarelto statins. 6. History of paroxysmal atrial fibrillation on Xarelto. 7. Chronic venous stasis dermatitis presently placed on Cipro by patient's dermatologist for cellulitis.  Given that patient has significant edema with anasarca with CHF will need close monitoring for any further deterioration in inpatient status.   DVT prophylaxis: Xarelto. Code Status: DNR. Family Communication: Patient's wife. Disposition Plan: Home. Consults called: Cardiology. Admission status: Inpatient.   Rise Patience MD [facility] Pager [phone].  If 7PM-7AM, please contact night-coverage [email or web address] Password [email or web address]  09/03/2019, 1:26 AM

## 2019-09-03 NOTE — Progress Notes (Signed)
Pharmacy Antibiotic Note  Luis Palmer is a 86 y.o. male admitted on 09/02/2019 with cellulitis.  Pharmacy has been consulted for Cipro dosing.  Plan: Cipro 250mg  PO BID.  Height: 6' (182.9 cm) Weight: 93.4 kg (206 lb) IBW/kg (Calculated) : 77.6  Temp (24hrs), Avg:97.8 F (36.6 C), Min:97.8 F (36.6 C), Max:97.8 F (36.6 C)  Recent Labs  Lab 09/02/19 1823  WBC 3.8*  CREATININE 2.07*    Estimated Creatinine Clearance: 31 mL/min (A) (by C-G formula based on SCr of 2.07 mg/dL (H)).    Allergies  Allergen Reactions  . Pseudoephedrine Hcl Other (See Comments)    Other Reaction: ANURIA  . Ace Inhibitors Other (See Comments) and Cough  . Nsaids Other (See Comments)    Reaction:  Fluid retention   . Vasotec [Enalapril] Cough     Thank you for allowing pharmacy to be a part of this patient's care.  Wynona Neat, PharmD, BCPS  09/03/2019 1:39 AM

## 2019-09-03 NOTE — Progress Notes (Signed)
Reviewed Echo Confirms severe bi ventricular failure with moderate MR and moderate to severe TR Dilated IVC and low output.  Discussed PIC line and milrinone with patient, wife and youngest daughter Discussed with current nurse to make sure PIC line is placed order previously written I will check latter tonight to see if in place with baseline coox and if so start milrinone Dr Aundra Dubin to see in am   Jenkins Rouge MD [facility]

## 2019-09-03 NOTE — ED Notes (Signed)
Breakfast Ordered 

## 2019-09-03 NOTE — Telephone Encounter (Signed)
Yes.  OK.  Noted. We can see him again after discharge.

## 2019-09-03 NOTE — Progress Notes (Signed)
Echo being done I spoke with Dr Aundra Dubin from [place] and he asked That PIC line be placed today He will see in am and likely start trial Of Milrinone Coox to be drawn from [facility] line when functional  Jenkins Rouge MD [facility]

## 2019-09-03 NOTE — Telephone Encounter (Addendum)
Spouse called regarding patient has been admitted to the ER. He has been coming in to see Dr. Nehemiah Massed for bandage changes and wanted to confirm it is OK for hospital to change for Luis Palmer. Advised OK for nurses to change there. Per the spouse they are unaware of when he will be discharged and sent home.

## 2019-09-03 NOTE — Progress Notes (Signed)
Spoke to primary RN re: PICC placement ,aware that PICC will be place tomorrow but if needed advised to place a CVC.

## 2019-09-03 NOTE — Plan of Care (Signed)
  Problem: Education: Goal: Knowledge of General Education information will improve Description Including pain rating scale, medication(s)/side effects and non-pharmacologic comfort measures Outcome: Progressing   

## 2019-09-03 NOTE — Consult Note (Signed)
Cardiology Consultation:   Patient ID: Luis Palmer MRN: [id number]; DOB: 1933/05/10  Admit date: 09/02/2019 Date of Consult: 09/03/2019  Primary Care Provider: Idelle Crouch, MD Primary Cardiologist: Dr. Stann Mainland ([facility]) Primary Electrophysiologist:  Dr. Lurene Shadow ([facility])   Patient Profile:   Luis Palmer is a 86 y.o. male with a history of CAD x2 s/p CABG in 0258, chronic systolic CHF/ischemic cardiomyopathy with EF of 20% in 03/2014 s/p dual chamber CRT, persistent atrial fibrillation/flutter s/p ablation in 2010 and 2012 on Xarelto, type 2 diabetes mellitus, and CKD stage IV, anemia, and colon cancer who is being seen today for the evaluation of acute on chronic CHF at the request of Dr. Hal Hope.  History of Present Illness:   Luis Palmer is a 86 year old male with the above history who is followed by Dr. Stann Mainland at [facility] for her cardiac care. He has known CAD with a remote MI in 1986. He underwent CABG x2 at that time with LIMA to LAD and SVG to RCA. Cardiac catheterization in 04/2004, per Care Everywhere, showed occluded SVG to RVA but patent LIMA to LAD with non-obstructive disease elsewhere. EF has been low for several years. Looks like he may have had another cardiac catheterization in 2014 but I am unable to see the report in [place] recent Echo from 05/2019 showed LVEF of 15-20% with akinesis of lateral, inferior, and posterior walls and hypokinesis of anterior, septal, and apical walls. He has ICD and at generator change in 2014 he had dual chamber CRT placed. He also has a history of atrial fibrillation/flutter s/p pulmonary vein ablation in 09/2008 and repeat atrial fibrillation and atrial tachycardia ablation in 06/2010. The last device check I can see in Care Everywhere from 05/2019 showed persistent atrial flutter as well as one episode of dual tachycardia (atrial flutter and VT) that terminated with a  20 J shock. She was BiV paced at 68.7% of the time. Also noted  that patient was 6 months form ERI. Patient was last seen by Dr. Stann Mainland in 07/08/2019 at which time he was stable from a cardiac standpoint. He was able to perform all activities of daily living but noted fatigue and shortness of breath with minimal amounts of activity.   Patient presented to the [facility] ED via EMS yesterday for nausea, decreased appetite, and abdominal distension. Patient reports a general sense that something was not right similar to how he felt before his heart attack in 1986. However, he denies any pain including chest pain or abdominal pain. Of note, only symptoms at time of MI in 1986 was elbow pain. He continues to have shortness of breath with minimal amounts of activity and wife thinks shortness of breath has worsened a little bit. He describes stable orthopnea and no PND. He has sleep apnea and uses a CPAP machine. He was chronic lower extremity edema due to chronic venous insufficiency and was recently started on Cipro for cellulitis of legs. Wife states he lost about 50 lbs since 01/2018 after colon resection but states recently he has had more weight gain. His weight 1 month ago was about 198 lbs and weight on admission was 206 lbs. Wife also describe worsening abdominal distension and states they have had to increase his pant size. BP his chronically soft but patient denies any lightheadedness, dizziness, or syncope. No palpitations or ICD shocks. He did have a mechanical falls a couple of weeks ago and states he tripped over a rug. He felt on  his tailbone but did not hit his head. Patient also notes intermittent nausea for the past several days but no vomiting or diarrhea. He notes occasional cough, especially in the morning, but denies any recent fevers or illnesses. No abnormal bleeding in urine or stools.   In the ED, BP soft but stable. EKG showed ventricular paced rhythm. High-sensitivity troponin minimally elevated and flat at 27 >>27. BNP elevated at >4,500. Chest x-ray  showed cardiomegaly with vascular congestion, small right pleural effusion, and bibasilar atelectasis. WBC 3.8, Hgb 12.7, Plts 129. Na 130, K 3.7, Glucose 131, BUN 49, Cr 2.07. Albumin 3.4, AST 28, ALT 20, Alk Phos 96, Total Bili 2.0, Direct Bili 0.8, Indirect Bili 1.2. COVID-19 negative.   At the time of this evaluation, patient resting comfortably. He has not noticed any significant changes so far with Lasix; however, wife states he does seem to be able to talk easier. Of note, wife states patient has been a little more confused lately. No official diagnosis of dementia but patient is on Aricept. Wife also states he was having hallucination in the ED (said there were flowers in styrofoam cup).   Patient has remote history of alcohol and tobacco use but quit both about 35 years ago.   Past Medical History:  Diagnosis Date   AICD (automatic cardioverter/defibrillator) present 2006   Anemia    Atrial fibrillation (HCC)    CHF (congestive heart failure) ([place])    CKD (chronic kidney disease), stage III    Colon cancer ([place])    "dx'd 01/24/2018"   High cholesterol    History of blood transfusion 01/22/2018   "I lost blood; HgB extremely low"   History of gout    Myocardial infarction ([place]) 1986   OSA on CPAP    Pneumonia 2015   Type II diabetes mellitus ([place])     Past Surgical History:  Procedure Laterality Date   APPENDECTOMY     ATRIAL FIBRILLATION ABLATION  X 2   CARDIAC CATHETERIZATION     CARDIAC DEFIBRILLATOR PLACEMENT  2006   "added lead and had battery changed once since the initial OR" (01/29/2018)   CATARACT EXTRACTION W/ INTRAOCULAR LENS  IMPLANT, BILATERAL Bilateral    COLONOSCOPY WITH PROPOFOL N/A 01/23/2018   Procedure: COLONOSCOPY WITH PROPOFOL;  Surgeon: Lucilla Lame, MD;  Location: [facility] ENDOSCOPY;  Service: Endoscopy;  Laterality: N/A;   CORONARY ARTERY BYPASS GRAFT  1986   "CABG X2"   CYSTOSCOPY N/A 01/30/2018   Procedure: CYSTOSCOPY FLEXIBLE WITH  INSERTION OF CATHETER;  Surgeon: Jovita Kussmaul, MD;  Location: [place];  Service: General;  Laterality: N/A;   ESOPHAGOGASTRODUODENOSCOPY (EGD) WITH PROPOFOL N/A 01/22/2018   Procedure: ESOPHAGOGASTRODUODENOSCOPY (EGD) WITH PROPOFOL;  Surgeon: Lucilla Lame, MD;  Location: [facility] ENDOSCOPY;  Service: Endoscopy;  Laterality: N/A;   INSERT / REPLACE / REMOVE PACEMAKER     LAPAROSCOPIC PARTIAL COLECTOMY N/A 01/30/2018   Procedure: LAPAROSCOPIC ASSISTED EXTENDED RIGHT COLECTOMY;  Surgeon: Jovita Kussmaul, MD;  Location: [place];  Service: General;  Laterality: N/A;   NASAL SINUS SURGERY       Home Medications:  Prior to Admission medications   Medication Sig Start Date End Date Taking? Authorizing Provider  acetaminophen (TYLENOL) 500 MG tablet Take 2 tablets (1,000 mg total) by mouth every 8 (eight) hours. Patient taking differently: Take 1,000 mg by mouth daily as needed for headache (pain).  02/12/18  Yes Cristal Deer, MD  canagliflozin (INVOKANA) 100 MG TABS tablet Take by mouth. 04/24/19 04/23/20  Yes [provider]  ciprofloxacin (CIPRO) 250 MG tablet Take 1 tablet (250 mg total) by mouth daily. 08/27/19  Yes Ralene Bathe, MD  donepezil (ARICEPT) 5 MG tablet Take 5 mg by mouth at bedtime.  06/11/19  Yes [provider]  ezetimibe (ZETIA) 10 MG tablet Take 10 mg by mouth daily.    Yes [provider]  guaiFENesin (MUCINEX PO) Take 1 tablet by mouth 2 (two) times daily.   Yes [provider]  hydrocortisone 2.5 % cream Apply 1 application topically daily as needed (rash/irritation).  06/06/19  Yes [provider]  latanoprost (XALATAN) 0.005 % ophthalmic solution Place 1 drop into both eyes at bedtime.  12/14/18  Yes [provider]  Multiple Vitamin (MULTIVITAMIN WITH MINERALS) TABS tablet Take 1 tablet by mouth daily. 02/13/18  Yes Cristal Deer, MD  Multiple Vitamins-Minerals (ZINC PO) Take 1 tablet by mouth daily.   Yes [provider]  rivaroxaban (XARELTO) 10 MG TABS tablet Take 10 mg by mouth daily.   Yes [provider]  rosuvastatin (CRESTOR) 10 MG tablet Take 10 mg by mouth at bedtime.  12/30/17  Yes [provider]  sacubitril-valsartan (ENTRESTO) 24-26 MG Take 1 tablet by mouth 2 (two) times daily.   Yes [provider]  sodium chloride (OCEAN) 0.65 % nasal spray Place 1 spray into the nose as needed for congestion.  06/12/19  Yes [provider]  tamsulosin (FLOMAX) 0.4 MG CAPS capsule Take 1 capsule (0.4 mg total) by mouth at bedtime. 02/12/18  Yes Cristal Deer, MD  torsemide (DEMADEX) 20 MG tablet Take 30 mg by mouth daily.  06/10/19  Yes [provider]  VITAMIN D PO Take 1 capsule by mouth daily.    Yes [provider]    Inpatient Medications: Scheduled Meds:  ciprofloxacin  250 mg Oral BID   donepezil  5 mg Oral QHS   ezetimibe  10 mg Oral Daily   furosemide  40 mg Intravenous Q12H   insulin aspart  0-6 Units Subcutaneous TID WC   latanoprost  1 drop Both Eyes QHS   multivitamin with minerals  1 tablet Oral Daily   rivaroxaban  10 mg Oral Daily   rosuvastatin  10 mg Oral QHS   tamsulosin  0.4 mg Oral QHS   Continuous Infusions:  PRN Meds: acetaminophen **OR** acetaminophen, ondansetron **OR** ondansetron (ZOFRAN) IV  Allergies:    Allergies  Allergen Reactions   Pseudoephedrine Hcl Other (See Comments)    Other Reaction: ANURIA   Ace Inhibitors Other (See Comments) and Cough   Nsaids Other (See Comments)    Reaction:  Fluid retention    Vasotec [Enalapril] Cough    Social History:   Social History   Socioeconomic History   Marital status: Married    Spouse name: Ann   Number of children: 4   Years of education: Not on file   Highest education level: Not on file  Occupational History   Not on file  Tobacco Use   Smoking status: Former Smoker    Packs/day: 1.00    Years: 40.00    Pack years:  40.00    Types: Cigarettes    Quit date: 1986    Years since quitting: 35.2   Smokeless tobacco: Never Used  Substance and Sexual Activity   Alcohol use: Not Currently   Drug use: Never   Sexual activity: Not on file  Other Topics Concern   Not on file  Social History Narrative   Not on file   Social Determinants of Health   Financial Resource Strain:    Difficulty of Paying Living Expenses:   Food Insecurity:    Worried About Charity fundraiser in the Last Year:    Arboriculturist in the Last Year:   Transportation Needs:    Film/video editor (Medical):    Lack of Transportation (Non-Medical):   Physical Activity:    Days of Exercise per Week:    Minutes of Exercise per Session:   Stress:    Feeling of Stress :   Social Connections:    Frequency of Communication with Friends and Family:    Frequency of Social Gatherings with Friends and Family:    Attends Religious Services:    Active Member of Clubs or Organizations:    Attends Music therapist:    Marital Status:   Intimate Partner Violence:    Fear of Current or Ex-Partner:    Emotionally Abused:    Physically Abused:    Sexually Abused:     Family History:    Family History  Problem Relation Age of Onset   Congestive Heart Failure Mother    Diabetes Father    Stroke Father    Lung cancer Brother 78   Skin cancer Brother        squamous cell   Heart attack Maternal Uncle      ROS:  Please see the history of present illness.  Review of Systems  Constitutional: Negative for chills and fever.  HENT: Negative for congestion.   Respiratory: Positive for shortness of breath. Negative for cough and hemoptysis.   Cardiovascular: Positive for orthopnea and leg swelling. Negative for chest pain, palpitations and PND.  Gastrointestinal: Positive for nausea. Negative for abdominal pain, blood in stool, melena and vomiting.  Genitourinary: Negative for hematuria.    Musculoskeletal: Positive for falls. Negative for myalgias.  Neurological: Negative for dizziness and loss of consciousness.  Endo/Heme/Allergies: Bruises/bleeds easily (brusises easily but no abnormal bleeding).  Psychiatric/Behavioral: Positive for memory loss. Negative for substance abuse.    Physical Exam/Data:   Vitals:   09/03/19 0200 09/03/19 0400 09/03/19 0445 09/03/19 0800  BP: 98/66 100/65  (!) 96/59  Pulse: 64  (!) 103 77  Resp: 20  (!) 22 (!) 23  Temp:      TempSrc:      SpO2: 93%  97% 99%  Weight:      Height:        Intake/Output Summary (Last 24 hours) at 09/03/2019 1252 Last data filed at 09/03/2019 0833 Gross per 24 hour  Intake --  Output 500 ml  Net -500 ml   Last 3 Weights 09/02/2019 07/17/2019 04/12/2019  Weight (lbs) 206 lb 202 lb 11.2 oz 204 lb 14.4 oz  Weight (kg) 93.441 kg 91.944 kg 92.942 kg     Body mass index is 27.94 kg/m.   General: 86 y.o. male resting comfortably in no acute distress. HEENT: Normocephalic and atraumatic. Sclera clear. Neck: Supple. No carotid bruits. JVD elevated. Heart: RRR. Distinct S1 and S2. No murmurs, gallops, or rubs. Radial pulses soft bilaterally. Lungs: No increased work of breathing. Clear to ausculation bilaterally. No wheezes, rhonchi, or rales.  Abdomen: Soft, non-distended, and non-tender to palpation. Bowel sounds present.  Extremities: 2-3+ pitting edema of bilateral lower extremities (extending all the way up legs).  Skin: Warm and dry. Neuro: No focal deficits. Psych: Normal affect. Responds appropriately.  EKG:  The EKG was personally reviewed and demonstrates: Ventricular paced rhythm with rate of 79 bpm.   Telemetry:  Telemetry was personally reviewed and demonstrates:  Not currently on telemetry.  Relevant CV Studies:  Echocardiogram 06/10/2019 ([facility]): Interpretation: - Severe LV dysfunction. EF 15-20%. Akinetic lateral, inferior, and posterior walls. Hypokinesis of anterior, septal, and apical  walls.  - Mild RV systolic dysfunction. - Moderate MR, mild TR, and trivial PR.  Laboratory Data:  High Sensitivity Troponin:   Recent Labs  Lab 09/02/19 1823 09/02/19 2037  TROPONINIHS 27* 27*     Chemistry Recent Labs  Lab 09/02/19 1823 09/03/19 0836  NA 130* 136  K 3.7 3.8  CL 94* 97*  CO2 24 23  GLUCOSE 131* 107*  BUN 49* 50*  CREATININE 2.07* 2.09*  CALCIUM 9.5 9.4  GFRNONAA 28* 28*  GFRAA 33* 32*  ANIONGAP 12 16*    Recent Labs  Lab 09/02/19 1823 09/03/19 0836  PROT 6.3* 6.0*  ALBUMIN 3.4* 3.1*  AST 28 46*  ALT 20 31  ALKPHOS 96 83  BILITOT 2.0* 2.2*   Hematology Recent Labs  Lab 09/02/19 1823 09/03/19 0836  WBC 3.8* 5.1  RBC 4.06* 3.92*  HGB 12.7* 12.2*  HCT 39.2 38.3*  MCV 96.6 97.7  MCH 31.3 31.1  MCHC 32.4 31.9  RDW 17.8* 18.0*  PLT 129* 124*   BNP Recent Labs  Lab 09/02/19 1900  BNP >4,500.0*    DDimer No results for input(s): DDIMER in the last 168 hours.   Radiology/Studies:  DG Chest Portable 1 View  Result Date: 09/02/2019 CLINICAL DATA:  Shortness of breath EXAM: PORTABLE CHEST 1 VIEW COMPARISON:  01/31/2018 FINDINGS: Left AICD remains in place, unchanged. Cardiomegaly, prior CABG. Small right pleural effusion. Bibasilar opacities, likely atelectasis. Mild vascular congestion. No overt edema. IMPRESSION: Cardiomegaly, vascular congestion. Small right pleural effusion.  Bibasilar atelectasis. Electronically Signed   By: Rolm Baptise M.D.   On: 09/02/2019 19:08    Assessment and Plan:   Acute on Chronic Systolic CHF/Ischemic Cardiomyopathy s/p CRT - Patient presented with abdominal distension, nausea, and decreased appetite. - BNP > 4,500.  - Chest x-ray showed cardiomegaly with vascular congestion, small right pleural effusion, and bibasilar atelectasis. - Most recent Echo in 05/2019 at [facility] showed LVEF of 15-20%. RV also had mildly reduced systolic function.  - Will recheck Echo here. - Patient on IV Lasix 75m twice daily.  Documented urinary output of 500.  - Lungs clear but patient has significant lower extremity edema and JVD elevated. Seem like more right sided CHF. - Will continue current dose of IV Lasix for now.  - Hold home Entresto due to soft BP and renal function. - Previously on Coreg but this was stopped by primary Cardiologist due to soft BP.  - Continue to monitor daily weights, strict I/O's, and renal function.   CAD - History of CABG x2 (LIMA to LAD and SVG to RCA) in 1986. Cardiac cath in 2005 showed occluded SVG to RCA but patent LIMA to LAD.  - No chest pain.  - No Aspirin due to need for Xarelto. Continue statin and Zetia.   Demand Ischemia - High-sensitivity minimally elevated and flat at 27 >> 27. Not consistent with ACS.  - EKG shows ventricular paced rhythm.  - No chest pain. - Likely demand ischemia in setting of acute on chronic CHF with soft BP and known underlying CAD. No ischemic evaluation at this time but will check Echo.  Persistent Atrial Fibrillation/Flutter  - Last device check in 05/2019 showed persistent atrial flutter as well as one episodes of dual tachycardia (atrial flutter and VT) that terminated with terminated with a  20 J shock. Asymptomatic with this. - Difficulty to tell underlying rhythm with ventricular pacing. Rate controlled.  - Continue chronic anticoagulation with Xarelto 68m daily.   S/p Dual Chamber CRT - Last device check in 05/2019 showed 68.7% of the time. Also showed patient was 6 months out from reaching ERI.  - [facility]   Hypotensive - BP soft but stable. This seems to be a chronic issue. Currently asymptomatic with this.  - [place]  - Continue to monitor closely with diuresis.   Hyperlipidemia - Continue home Crestor 187mdaily and Zetia 1012maily.   CKD Stage IV - Creatinine 2.09 today. Baseline 1.5 to 1.8.  - Continue to monitor.   Pancytopenia - WBC 3.8, Hgb 12.7, Plts 129 on admission.  - Continue to monitor  daily CBC.  - Management per primary team.   Otherwise, per primary team.   For questions or updates, please contact [email or web address] consult [email or web address] for contact info under     Signed, CalDarreld McleanA-C  09/03/2019 12:52 PM

## 2019-09-03 NOTE — Evaluation (Signed)
Physical Therapy Evaluation Patient Details Name: Luis Palmer MRN: [id number] DOB: 03-05-1933 Today's Date: 09/03/2019   History of Present Illness  Pt is an 86 y/o male admitted secondary to increased SOB and edema. Thought to be secondary to CHF exacerbation. PMH includes CAD s/p CABG, colon cancer, CKD, CHF, a fib, DM, s/p defibrillator.   Clinical Impression  Pt admitted secondary to problem above with deficits below. Pt requiring min guard to min A to stand and transfer to chair using RW. VSS throughout. Pt's wife reports she is available to assist at d/c. Educated about using RW to improve safety. Feel pt would benefit from HHPT at d/c. Will continue to follow acutely.     Follow Up Recommendations Home health PT;Supervision/Assistance - 24 hour    Equipment Recommendations  Other (comment)(TBD)    Recommendations for Other Services       Precautions / Restrictions Precautions Precautions: Fall Restrictions Weight Bearing Restrictions: No      Mobility  Bed Mobility Overal bed mobility: Needs Assistance Bed Mobility: Supine to Sit     Supine to sit: Min assist     General bed mobility comments: Min A for trunk elevation.   Transfers Overall transfer level: Needs assistance Equipment used: Rolling walker (2 wheeled) Transfers: Sit to/from [facility] Sit to Stand: Min assist;From elevated surface Stand pivot transfers: Min guard       General transfer comment: Min A for steadying assist to stand from elevated surface. Min guard A for steadying assist to transfer to chair.   Ambulation/Gait                Stairs            Wheelchair Mobility    Modified Rankin (Stroke Patients Only)       Balance Overall balance assessment: Needs assistance Sitting-balance support: No upper extremity supported;Feet supported Sitting balance-Leahy Scale: Good     Standing balance support: Bilateral upper extremity supported;During  functional activity Standing balance-Leahy Scale: Poor Standing balance comment: Reliant on BUE support                             Pertinent Vitals/Pain Pain Assessment: No/denies pain    Home Living Family/patient expects to be discharged to:: Private residence Living Arrangements: Spouse/significant other Available Help at Discharge: Family;Available 24 hours/day Type of Home: House Home Access: Level entry     Home Layout: One level Home Equipment: Walker - 2 wheels;Toilet riser;Cane - single point;Grab bars - tub/shower      Prior Function Level of Independence: Independent with assistive device(s)         Comments: Has been using RW since being weak. Used cane prior to that.      Hand Dominance        Extremity/Trunk Assessment   Upper Extremity Assessment Upper Extremity Assessment: Defer to OT evaluation    Lower Extremity Assessment Lower Extremity Assessment: Generalized weakness    Cervical / Trunk Assessment Cervical / Trunk Assessment: Kyphotic  Communication   Communication: No difficulties  Cognition Arousal/Alertness: Awake/alert Behavior During Therapy: WFL for tasks assessed/performed Overall Cognitive Status: Impaired/Different from baseline Area of Impairment: Memory;Problem solving                     Memory: Decreased short-term memory       Problem Solving: Slow processing General Comments: Pt's wife reports pt with some confusion lately. Also  with some slowed processing.       General Comments General comments (skin integrity, edema, etc.): Pt's wife present during session     Exercises     Assessment/Plan    PT Assessment Patient needs continued PT services  PT Problem List Decreased strength;Decreased activity tolerance;Decreased balance;Decreased mobility;Cardiopulmonary status limiting activity;Decreased cognition       PT Treatment Interventions Gait training;DME instruction;Functional mobility  training;Therapeutic exercise;Balance training;Therapeutic activities;Patient/family education    PT Goals (Current goals can be found in the Care Plan section)  Acute Rehab PT Goals Patient Stated Goal: to go home PT Goal Formulation: With patient Time For Goal Achievement: 09/17/19 Potential to Achieve Goals: Good    Frequency Min 3X/week   Barriers to discharge        Co-evaluation               AM-PAC PT "6 Clicks" Mobility  Outcome Measure Help needed turning from your back to your side while in a flat bed without using bedrails?: None Help needed moving from lying on your back to sitting on the side of a flat bed without using bedrails?: None Help needed moving to and from a bed to a chair (including a wheelchair)?: A Little Help needed standing up from a chair using your arms (e.g., wheelchair or bedside chair)?: A Little Help needed to walk in hospital room?: A Little Help needed climbing 3-5 steps with a railing? : A Lot 6 Click Score: 19    End of Session Equipment Utilized During Treatment: Gait belt Activity Tolerance: Patient tolerated treatment well Patient left: in chair;with call bell/phone within reach;with family/visitor present Nurse Communication: Mobility status PT Visit Diagnosis: Unsteadiness on feet (R26.81);Muscle weakness (generalized) (M62.81)    Time: 4034-7425 PT Time Calculation (min) (ACUTE ONLY): 30 min   Charges:   PT Evaluation $PT Eval Moderate Complexity: 1 Mod PT Treatments $Therapeutic Activity: 8-22 mins        Lou Miner, DPT  Acute Rehabilitation Services  Pager: [phone] Office: [phone]   Rudean Hitt 09/03/2019, 6:17 PM

## 2019-09-03 NOTE — Progress Notes (Signed)
PROGRESS NOTE    Luis Palmer  DGU:[id number] DOB: [birth year] DOA: 09/02/2019 PCP: Idelle Crouch, MD   Brief Narrative:  86 year old with history of CAD status post CABG, systolic CHF EF 95% status post ICD, chronic A. fib, right-sided no carcinoma of colon status post partial resection, iron deficiency anemia, CKD stage IIIa, DM2 presented with worsening shortness of breath and peripheral edema.  Found to be in CHF exacerbation, started on diuretics and admitted to the hospital.   Assessment & Plan:   Principal Problem:   Acute on chronic systolic CHF (congestive heart failure) (HCC) Active Problems:   Adenocarcinoma of colon (HCC)   Biventricular implantable cardioverter-defibrillator in situ   Type II diabetes mellitus (HCC)   CKD (chronic kidney disease) stage 3, GFR 30-59 ml/min   Pancytopenia (HCC)   CHF (congestive heart failure) (HCC)  Acute congestive heart failure with reduced ejection fraction, 20%.  Class III -Continue aggressive diuretics.  Lasix 40 mg IV twice daily -Strict input and output. -Monitor electrolytes and replete as necessary -Consulted cardiology  Acute kidney injury on CKD stage III A -Baseline creatinine 1.4.  Admission creatinine 2.0. -Monitor closely while on diuretics -We will hold Entresto  Coronary artery disease status post CABG -Currently chest pain-free.  Chronic atrial fibrillation -Continue Xarelto  History of diabetes mellitus type 2 -Sliding scale  Anemia of chronic disease.  Also has iron deficiency -Follow-up patient oncology.  Closely monitor hemoglobin.  History of dementia -On Aricept  DVT prophylaxis: Xarelto Code Status: DNR Family Communication: Wife at bedside Disposition Plan:   Patient From= Home  Patient Anticipated D/C place= Home  Barriers= maintain hospital stay for IV diuretics.  Has quite a bit of exertional dyspnea.  Not on any oxygen right now but given his frailty, advanced cardiomyopathy,  other comorbidities he is unsafe for discharge.   Subjective: Patient seen and examined at bedside, tells me he feels slightly better after being diuresed in the morning.  Wife is at the bedside.  Does have exertional dyspnea.  Typically follows with cardiology in [place] but recently is routine cardiologist retired so he is trying to get in with a new cardiology team.   Review of Systems Otherwise negative except as per HPI, including: General: Denies fever, chills, night sweats or unintended weight loss. Resp: Denies cough Cardiac: Denies chest pain, palpitations, orthopnea, paroxysmal nocturnal dyspnea. GI: Denies abdominal pain, nausea, vomiting, diarrhea or constipation GU: Denies dysuria, frequency, hesitancy or incontinence MS: Denies muscle aches, joint pain or swelling Neuro: Denies headache, neurologic deficits (focal weakness, numbness, tingling), abnormal gait Psych: Denies anxiety, depression, SI/HI/AVH Skin: Denies new rashes or lesions ID: Denies sick contacts, exotic exposures, travel  Examination:  General exam: Appears calm and comfortable  Respiratory system: Bibasilar rhonchi Cardiovascular system: S1 & S2 heard, RRR. No JVD, murmurs, rubs, gallops or clicks. No pedal edema. Gastrointestinal system: Abdomen is nondistended, soft and nontender. No organomegaly or masses felt. Normal bowel sounds heard. Central nervous system: Alert and oriented. No focal neurological deficits. Extremities: Symmetric 5 x 5 power. Skin: Surgical scar on his chest Psychiatry: Judgement and insight appear normal. Mood & affect appropriate.   Objective: Vitals:   09/03/19 0200 09/03/19 0400 09/03/19 0445 09/03/19 0800  BP: 98/66 100/65  (!) 96/59  Pulse: 64  (!) 103 77  Resp: 20  (!) 22 (!) 23  Temp:      TempSrc:      SpO2: 93%  97% 99%  Weight:  Height:        Intake/Output Summary (Last 24 hours) at 09/03/2019 0850 Last data filed at 09/03/2019 0833 Gross per 24 hour    Intake --  Output 500 ml  Net -500 ml   Filed Weights   09/02/19 1817  Weight: 93.4 kg     Data Reviewed:   CBC: Recent Labs  Lab 09/02/19 1823  WBC 3.8*  NEUTROABS 1.5*  HGB 12.7*  HCT 39.2  MCV 96.6  PLT 294*   Basic Metabolic Panel: Recent Labs  Lab 09/02/19 1823  NA 130*  K 3.7  CL 94*  CO2 24  GLUCOSE 131*  BUN 49*  CREATININE 2.07*  CALCIUM 9.5   GFR: Estimated Creatinine Clearance: 31 mL/min (A) (by C-G formula based on SCr of 2.07 mg/dL (H)). Liver Function Tests: Recent Labs  Lab 09/02/19 1823  AST 28  ALT 20  ALKPHOS 96  BILITOT 2.0*  PROT 6.3*  ALBUMIN 3.4*   No results for input(s): LIPASE, AMYLASE in the last 168 hours. No results for input(s): AMMONIA in the last 168 hours. Coagulation Profile: Recent Labs  Lab 09/02/19 1823  INR 4.3*   Cardiac Enzymes: No results for input(s): CKTOTAL, CKMB, CKMBINDEX, TROPONINI in the last 168 hours. BNP (last 3 results) No results for input(s): PROBNP in the last 8760 hours. HbA1C: No results for input(s): HGBA1C in the last 72 hours. CBG: Recent Labs  Lab 09/03/19 0830  GLUCAP 108*   Lipid Profile: No results for input(s): CHOL, HDL, LDLCALC, TRIG, CHOLHDL, LDLDIRECT in the last 72 hours. Thyroid Function Tests: No results for input(s): TSH, T4TOTAL, FREET4, T3FREE, THYROIDAB in the last 72 hours. Anemia Panel: No results for input(s): VITAMINB12, FOLATE, FERRITIN, TIBC, IRON, RETICCTPCT in the last 72 hours. Sepsis Labs: No results for input(s): PROCALCITON, LATICACIDVEN in the last 168 hours.  Recent Results (from the past 240 hour(s))  SARS CORONAVIRUS 2 (TAT 6-24 HRS) Nasopharyngeal Nasopharyngeal Swab     Status: None   Collection Time: 09/03/19  1:59 AM   Specimen: Nasopharyngeal Swab  Result Value Ref Range Status   SARS Coronavirus 2 NEGATIVE NEGATIVE Final    Comment: (NOTE) SARS-CoV-2 target nucleic acids are NOT DETECTED. The SARS-CoV-2 RNA is generally detectable in  upper and lower respiratory specimens during the acute phase of infection. Negative results do not preclude SARS-CoV-2 infection, do not rule out co-infections with other pathogens, and should not be used as the sole basis for treatment or other patient management decisions. Negative results must be combined with clinical observations, patient history, and epidemiological information. The expected result is Negative. Fact Sheet for Patients: SugarRoll.be Fact Sheet for Healthcare Providers: https://www.woods-mathews.com/ This test is not yet approved or cleared by the [place] FDA and  has been authorized for detection and/or diagnosis of SARS-CoV-2 by FDA under an Emergency Use Authorization (EUA). This EUA will remain  in effect (meaning this test can be used) for the duration of the COVID-19 declaration under Section 56 4(b)(1) of the Act, 21 U.S.C. section 360bbb-3(b)(1), unless the authorization is terminated or revoked sooner. Performed at [facility], [place] [address] [place], [place] [address]          Radiology Studies: DG Chest Portable 1 View  Result Date: 09/02/2019 CLINICAL DATA:  Shortness of breath EXAM: PORTABLE CHEST 1 VIEW COMPARISON:  01/31/2018 FINDINGS: Left AICD remains in place, unchanged. Cardiomegaly, prior CABG. Small right pleural effusion. Bibasilar opacities, likely atelectasis. Mild vascular congestion. No overt  edema. IMPRESSION: Cardiomegaly, vascular congestion. Small right pleural effusion.  Bibasilar atelectasis. Electronically Signed   By: Rolm Baptise M.D.   On: 09/02/2019 19:08        Scheduled Meds: . ciprofloxacin  250 mg Oral BID  . donepezil  5 mg Oral QHS  . ezetimibe  10 mg Oral Daily  . furosemide  40 mg Intravenous Q12H  . insulin aspart  0-6 Units Subcutaneous TID WC  . latanoprost  1 drop Both Eyes QHS  . multivitamin with minerals  1 tablet Oral Daily  . rivaroxaban  10 mg  Oral Daily  . rosuvastatin  10 mg Oral QHS  . sacubitril-valsartan  1 tablet Oral BID  . tamsulosin  0.4 mg Oral QHS   Continuous Infusions:   LOS: 1 day   Time spent= 35 mins    Ripken Rekowski Arsenio Loader, MD [facility]  If 7PM-7AM, please contact night-coverage  09/03/2019, 8:50 AM

## 2019-09-03 NOTE — ED Notes (Signed)
Patient placed on 3L O2 for O2 sat of 83% on Room air, patient's family member at bedside states he wears cpap at night. O2 sat improved to 100%, resp e/u, nad.

## 2019-09-03 NOTE — Progress Notes (Signed)
  Echocardiogram 2D Echocardiogram has been performed.  Jennette Dubin 09/03/2019, 5:09 PM

## 2019-09-04 DIAGNOSIS — N179 Acute kidney failure, unspecified: Secondary | ICD-10-CM

## 2019-09-04 DIAGNOSIS — I5023 Acute on chronic systolic (congestive) heart failure: Secondary | ICD-10-CM | POA: Diagnosis not present

## 2019-09-04 LAB — COOXEMETRY PANEL
Carboxyhemoglobin: 1.7 % — ABNORMAL HIGH (ref 0.5–1.5)
Carboxyhemoglobin: 1.5 % (ref 0.5–1.5)
Carboxyhemoglobin: 1.6 % — ABNORMAL HIGH (ref 0.5–1.5)
Methemoglobin: 0.4 % (ref 0.0–1.5)
Methemoglobin: 0.6 % (ref 0.0–1.5)
Methemoglobin: 0.4 % (ref 0.0–1.5)
O2 Saturation: 79.8 %
O2 Saturation: 40.2 %
O2 Saturation: 55.1 %
Total hemoglobin: 12.3 g/dL (ref 12.0–16.0)
Total hemoglobin: 11.4 g/dL — ABNORMAL LOW (ref 12.0–16.0)
Total hemoglobin: 11.5 g/dL — ABNORMAL LOW (ref 12.0–16.0)

## 2019-09-04 LAB — CBC
HCT: 40.7 % (ref 39.0–52.0)
Hemoglobin: 13.1 g/dL (ref 13.0–17.0)
MCH: 31.3 pg (ref 26.0–34.0)
MCHC: 32.2 g/dL (ref 30.0–36.0)
MCV: 97.4 fL (ref 80.0–100.0)
Platelets: 123 10*3/uL — ABNORMAL LOW (ref 150–400)
RBC: 4.18 MIL/uL — ABNORMAL LOW (ref 4.22–5.81)
RDW: 18.4 % — ABNORMAL HIGH (ref 11.5–15.5)
WBC: 4.8 10*3/uL (ref 4.0–10.5)
nRBC: 0 % (ref 0.0–0.2)

## 2019-09-04 LAB — BASIC METABOLIC PANEL
Anion gap: 17 — ABNORMAL HIGH (ref 5–15)
BUN: 50 mg/dL — ABNORMAL HIGH (ref 8–23)
CO2: 22 mmol/L (ref 22–32)
Calcium: 9.7 mg/dL (ref 8.9–10.3)
Chloride: 97 mmol/L — ABNORMAL LOW (ref 98–111)
Creatinine, Ser: 2.09 mg/dL — ABNORMAL HIGH (ref 0.61–1.24)
GFR calc Af Amer: 32 mL/min — ABNORMAL LOW (ref >60)
GFR calc non Af Amer: 28 mL/min — ABNORMAL LOW (ref >60)
Glucose, Bld: 128 mg/dL — ABNORMAL HIGH (ref 70–99)
Potassium: 4.3 mmol/L (ref 3.5–5.1)
Sodium: 136 mmol/L (ref 135–145)

## 2019-09-04 LAB — MAGNESIUM: Magnesium: 2.3 mg/dL (ref 1.7–2.4)

## 2019-09-04 LAB — GLUCOSE, CAPILLARY
Glucose-Capillary: 121 mg/dL — ABNORMAL HIGH (ref 70–99)
Glucose-Capillary: 134 mg/dL — ABNORMAL HIGH (ref 70–99)
Glucose-Capillary: 159 mg/dL — ABNORMAL HIGH (ref 70–99)
Glucose-Capillary: 158 mg/dL — ABNORMAL HIGH (ref 70–99)

## 2019-09-04 MED ORDER — SODIUM CHLORIDE 0.9% FLUSH
10.0000 mL | Freq: Two times a day (BID) | INTRAVENOUS | Status: DC
  Administered 2019-09-04 – 2019-09-08 (×9): 10 mL

## 2019-09-04 MED ORDER — AMIODARONE HCL 200 MG PO TABS
400.0000 mg | ORAL_TABLET | Freq: Two times a day (BID) | ORAL | Status: DC
  Administered 2019-09-04 – 2019-09-08 (×9): 400 mg via ORAL
  Filled 2019-09-04 (×10): qty 2

## 2019-09-04 MED ORDER — CHLORHEXIDINE GLUCONATE CLOTH 2 % EX PADS
6.0000 | MEDICATED_PAD | Freq: Every day | CUTANEOUS | Status: DC
  Administered 2019-09-06 – 2019-09-08 (×3): 6 via TOPICAL

## 2019-09-04 MED ORDER — BIOTENE DRY MOUTH MT LIQD: 15.0000 mL | OROMUCOSAL | Status: DC | PRN

## 2019-09-04 MED ORDER — APIXABAN 2.5 MG PO TABS
2.5000 mg | ORAL_TABLET | Freq: Two times a day (BID) | ORAL | Status: DC
  Administered 2019-09-04 – 2019-09-08 (×9): 2.5 mg via ORAL
  Filled 2019-09-04 (×9): qty 1

## 2019-09-04 MED ORDER — MILRINONE LACTATE IN DEXTROSE 20-5 MG/100ML-% IV SOLN
0.1250 ug/kg/min | INTRAVENOUS | Status: DC
  Administered 2019-09-04 – 2019-09-05 (×3): 0.25 ug/kg/min via INTRAVENOUS
  Administered 2019-09-06: 0.125 ug/kg/min via INTRAVENOUS
  Filled 2019-09-04 (×4): qty 100

## 2019-09-04 MED ORDER — SODIUM CHLORIDE 0.9% FLUSH: 10.0000 mL | INTRAVENOUS | Status: DC | PRN

## 2019-09-04 MED ORDER — FUROSEMIDE 10 MG/ML IJ SOLN
80.0000 mg | Freq: Two times a day (BID) | INTRAMUSCULAR | Status: DC
  Administered 2019-09-04 – 2019-09-05 (×2): 80 mg via INTRAVENOUS
  Filled 2019-09-04 (×2): qty 8

## 2019-09-04 NOTE — Consult Note (Addendum)
[facility] Nurse Consult Note: Reason for Consult: Consult requested for bilat legs.  Wife at bedside states patient developed increased edema and blisters which ruptured and evolved into full thickness wounds.  They went to the dermatologist who applied Una boots bilaterally and requested they be changed Q week.  Wound type: Removed Una boots and assessed legs; wife states they have improved in appearance. Pt has generalized edema and erythremia; upper calves where previous compression wraps had been placed have grooves from swelling. Multiple full thickness wounds are red and moist ( unless otherwise noted) with small amt tan drainage and some odor as follows:  Left calf 7X4X.2cm Left inner ankle 1.5X1.5X.2cm Left outer upper heel 3X3X.2cm Left anterior great toe 3X3X.2cm; 10% yellow, 90% red Right anterior foot 7X7X.3cm, 90% yellow, 10% red Right inner upper heel 8X8X.2cm Dressing procedure/placement/frequency: Ortho tech paged to apply bilat Una boots and coban to promote drying and healing and reduce edema and change Q Wed. Pt should follow-up with his previous physician after discharge.  Please re-consult if further assistance is needed.  Thank-you,  Julien Girt MSN, [place], [place], [place], [place]

## 2019-09-04 NOTE — Plan of Care (Signed)
  Problem: Education: Goal: Knowledge of General Education information will improve Description: Including pain rating scale, medication(s)/side effects and non-pharmacologic comfort measures Outcome: Progressing   Problem: Clinical Measurements: Goal: Ability to maintain clinical measurements within normal limits will improve Outcome: Progressing   Problem: Clinical Measurements: Goal: Cardiovascular complication will be avoided Outcome: Progressing   Problem: Nutrition: Goal: Adequate nutrition will be maintained Outcome: Progressing   Problem: Elimination: Goal: Will not experience complications related to urinary retention Outcome: Progressing   Problem: Pain Managment: Goal: General experience of comfort will improve Outcome: Progressing   Problem: Safety: Goal: Ability to remain free from injury will improve Outcome: Progressing   Problem: Skin Integrity: Goal: Risk for impaired skin integrity will decrease Outcome: Progressing   Problem: Activity: Goal: Capacity to carry out activities will improve Outcome: Progressing

## 2019-09-04 NOTE — Progress Notes (Signed)
Orthopedic Tech Progress Note Patient Details:  Luis Palmer 06-04-1932 [id number]  Patient ID: Luis Palmer, male   DOB: Apr 09, 1933, 86 y.o.   MRN: [id number]   Luis Palmer 09/04/2019, 6:40 PMPatients wife did not want unna boots applied till antibiotic ointment was applied. Nurse called wound care nurse for orders.

## 2019-09-04 NOTE — Evaluation (Signed)
Occupational Therapy Evaluation Patient Details Name: Luis Palmer MRN: [id number] DOB: 18-Apr-1933 Today's Date: 09/04/2019    History of Present Illness Pt is an 86 y/o male admitted secondary to increased SOB and edema. Thought to be secondary to CHF exacerbation. PMH includes CAD s/p CABG, colon cancer, CKD, CHF, a fib, DM, s/p defibrillator.    Clinical Impression   Pt PTA: Pt living with family, reports independence with ADL and mobility. Pt currently requires minA overall for ADL. Pt with decreased mobility, decreased strength and decreased ability to care for self. Pt standing at sink for ADL tasks x12 mins and no seated rest breaks taken. Pt on RA for O2 and VSS. Pt requiring verbal cues to attend to task as pt easily distracted. Spouse in room. Pt would benefit from continued OT skilled services. OT following acutely.     Follow Up Recommendations  Home health OT;Supervision/Assistance - 24 hour(initially)    Equipment Recommendations  3 in 1 bedside commode    Recommendations for Other Services       Precautions / Restrictions Precautions Precautions: Fall Restrictions Weight Bearing Restrictions: No      Mobility Bed Mobility Overal bed mobility: Needs Assistance Bed Mobility: Supine to Sit     Supine to sit: Min guard     General bed mobility comments: up at EOB  Transfers Overall transfer level: Needs assistance Equipment used: Rolling walker (2 wheeled) Transfers: Sit to/from Stand Sit to Stand: Min assist;From elevated surface         General transfer comment: minA for power up and for stability once standing    Balance Overall balance assessment: Needs assistance Sitting-balance support: No upper extremity supported;Feet supported Sitting balance-Leahy Scale: Good     Standing balance support: Bilateral upper extremity supported Standing balance-Leahy Scale: Poor Standing balance comment: Reliant on BUE support                            ADL either performed or assessed with clinical judgement   ADL Overall ADL's : Needs assistance/impaired Eating/Feeding: Modified independent;Sitting   Grooming: Supervision/safety;Standing   Upper Body Bathing: Supervision/ safety;Standing   Lower Body Bathing: Min guard;Sitting/lateral leans;Sit to/from stand;Cueing for safety   Upper Body Dressing : Supervision/safety;Standing   Lower Body Dressing: Minimal assistance;Cueing for safety;Sitting/lateral leans;Sit to/from stand   Toilet Transfer: Min guard;Cueing for safety;RW   Toileting- Water quality scientist and Hygiene: Minimal assistance;Cueing for safety;Sitting/lateral lean;Sit to/from stand       Functional mobility during ADLs: Min guard;Cueing for safety;Rolling walker General ADL Comments: Pt requires minA overall for ADL. Pt with decreased mobility, decreased strength and decreased ability to care for self.     Vision Baseline Vision/History: No visual deficits Vision Assessment?: No apparent visual deficits     Perception     Praxis      Pertinent Vitals/Pain Pain Assessment: Faces Faces Pain Scale: No hurt Pain Location: R knee prior to exercise Pain Descriptors / Indicators: Aching Pain Intervention(s): Monitored during session     Hand Dominance Right   Extremity/Trunk Assessment Upper Extremity Assessment Upper Extremity Assessment: Overall WFL for tasks assessed   Lower Extremity Assessment Lower Extremity Assessment: Generalized weakness   Cervical / Trunk Assessment Cervical / Trunk Assessment: Kyphotic   Communication Communication Communication: No difficulties   Cognition Arousal/Alertness: Awake/alert Behavior During Therapy: WFL for tasks assessed/performed Overall Cognitive Status: Impaired/Different from baseline Area of Impairment: Orientation  Orientation Level: Situation   Memory: Decreased short-term memory       Problem Solving: Slow  processing General Comments: Pt easily distracted. Cues to redirect.   General Comments  spouse present. pt stating "I want to talk to someone about my wife being able to stay over. I want to be able to get up in the middle of the night with her help." Pt and spouse education that the visiting rules are as is, but I can send your RN in here.    Exercises General Exercises - Lower Extremity Hip Flexion/Marching: Strengthening;10 reps;Standing Heel Raises: Strengthening;10 reps;Standing Mini-Sqauts: Strengthening;10 reps;Standing Other Exercises Other Exercises: seated scapular squeezes x 10 with 5 sec hold Other Exercises: seated trunk flex/ext for core strength arms crossed over chest x 10   Shoulder Instructions      Home Living Family/patient expects to be discharged to:: Private residence Living Arrangements: Spouse/significant other Available Help at Discharge: Family;Available 24 hours/day Type of Home: House Home Access: Level entry     Home Layout: One level     Bathroom Shower/Tub: Occupational psychologist: Handicapped height     Home Equipment: Environmental consultant - 2 wheels;Toilet riser;Cane - single point;Grab bars - tub/shower          Prior Functioning/Environment Level of Independence: Independent with assistive device(s)        Comments: Has been using RW since being weak. Used cane prior to that.         OT Problem List: Decreased strength;Decreased activity tolerance;Impaired balance (sitting and/or standing);Decreased safety awareness;Decreased coordination;Decreased knowledge of use of DME or AE;Pain;Increased edema      OT Treatment/Interventions: Self-care/ADL training;Therapeutic exercise;Energy conservation;DME and/or AE instruction;Therapeutic activities;Cognitive remediation/compensation;Visual/perceptual remediation/compensation;Patient/family education;Balance training    OT Goals(Current goals can be found in the care plan section) Acute  Rehab OT Goals Patient Stated Goal: to go home OT Goal Formulation: With patient Time For Goal Achievement: 09/18/19 Potential to Achieve Goals: Good ADL Goals Pt Will Perform Grooming: with supervision;standing Pt Will Transfer to Toilet: with supervision;ambulating;bedside commode Pt/caregiver will Perform Home Exercise Program: Increased strength;Both right and left upper extremity Additional ADL Goal #1: Pt will verbalize 2-3 energy conservation techniques for ADL and mobility with minimal cues.  OT Frequency: Min 2X/week   Barriers to D/C:            Co-evaluation              AM-PAC OT "6 Clicks" Daily Activity     Outcome Measure Help from another person eating meals?: A Little Help from another person taking care of personal grooming?: A Little Help from another person toileting, which includes using toliet, bedpan, or urinal?: A Little Help from another person bathing (including washing, rinsing, drying)?: A Little Help from another person to put on and taking off regular upper body clothing?: A Little Help from another person to put on and taking off regular lower body clothing?: A Little 6 Click Score: 18   End of Session Equipment Utilized During Treatment: Gait belt;Rolling walker;Oxygen Nurse Communication: Mobility status  Activity Tolerance: Patient tolerated treatment well Patient left: in chair;with call bell/phone within reach;with family/visitor present  OT Visit Diagnosis: Unsteadiness on feet (R26.81);Muscle weakness (generalized) (M62.81)                Time: 1010-1047 OT Time Calculation (min): 37 min Charges:  OT General Charges $OT Visit: 1 Visit OT Evaluation $OT Eval Moderate Complexity: 1 Mod OT Treatments $Self Care/Home  Management : 8-22 mins  Jefferey Pica, OTR/L Acute Rehabilitation Services Pager: [phone] Office: [phone]  Jefferey Pica 09/04/2019, 3:49 PM

## 2019-09-04 NOTE — Progress Notes (Signed)
PROGRESS NOTE    Luis Palmer  HDQ:[id number] DOB: 1932-07-26 DOA: 09/02/2019 PCP: Idelle Crouch, MD   Brief Narrative:  86 year old with history of CAD status post CABG, systolic CHF EF 11% status post ICD, chronic A. fib, right-sided no carcinoma of colon status post partial resection, iron deficiency anemia, CKD stage IIIa, DM2 presented with worsening shortness of breath and peripheral edema.  Found to be in CHF exacerbation, started on diuretics and admitted to the hospital.  CHF team following, PICC line placed.  Considering starting milrinone.   Assessment & Plan:   Principal Problem:   Acute on chronic systolic CHF (congestive heart failure) (HCC) Active Problems:   Adenocarcinoma of colon (HCC)   Biventricular implantable cardioverter-defibrillator in situ   Type II diabetes mellitus (HCC)   CKD (chronic kidney disease) stage 3, GFR 30-59 ml/min   Pancytopenia (HCC)   CHF (congestive heart failure) (HCC)  Acute congestive heart failure with reduced ejection fraction, 20%.  Class III -Echocardiogram here showed EF of 15-20%.  Reduced ventricular functions. -PICC line placed 4/6.  Plans to start milrinone -Strict input and output. -Monitor electrolytes and replete as necessary -Cardiology team following.  Acute kidney injury on CKD stage III A -Baseline creatinine 1.4.  Admission creatinine 2.0. -Monitor closely while on diuretics.  Closely monitor renal function. -We will hold Entresto  Coronary artery disease status post CABG -Currently chest pain-free.  Chronic atrial fibrillation -Continue Xarelto  History of diabetes mellitus type 2 -Sliding scale  Anemia of chronic disease.  Also has iron deficiency -Follow-up patient oncology.  Closely monitor hemoglobin.  History of dementia -On Aricept  Right lower extremity wounds noted-seen by wound care team.  DVT prophylaxis: Xarelto Code Status: DNR Family Communication: Wife is at  bedside Disposition Plan:   Patient From= Home  Patient Anticipated D/C place= Home  Barriers= maintain hospital stay for IV diuretics and inotrope arrangements.   Subjective: Had episode of delirium last night, still tired this morning but overall feels okay.  Review of Systems Otherwise negative except as per HPI, including: General: Denies fever, chills, night sweats or unintended weight loss. Resp: Denies hemoptysis Cardiac: Denies chest pain, palpitations, orthopnea, paroxysmal nocturnal dyspnea. GI: Denies abdominal pain, nausea, vomiting, diarrhea or constipation GU: Denies dysuria, frequency, hesitancy or incontinence MS: Denies muscle aches, joint pain or swelling Neuro: Denies headache, neurologic deficits (focal weakness, numbness, tingling), abnormal gait Psych: Denies anxiety, depression, SI/HI/AVH Skin: Denies new rashes or lesions ID: Denies sick contacts, exotic exposures, travel  Examination: Constitutional: Not in acute distress, elderly frail Respiratory: Bibasilar crackles Cardiovascular: Normal sinus rhythm, no rubs Abdomen: Nontender nondistended good bowel sounds Musculoskeletal: No edema noted Skin: No rashes seen Neurologic: CN 2-12 grossly intact.  And nonfocal Psychiatric: Normal judgment and insight. Alert and oriented x 3. Normal mood.  PICC line in place  Objective: Vitals:   09/03/19 1915 09/03/19 2311 09/04/19 0314 09/04/19 0756  BP: 108/65 93/66 108/66   Pulse: 77 80 95   Resp: 20 19 17    Temp: (!) 96.4 F (35.8 C) (!) 97.4 F (36.3 C) 97.6 F (36.4 C) (!) 97.5 F (36.4 C)  TempSrc: Oral Oral Oral Oral  SpO2: 94% 96% 100% 96%  Weight:   92.2 kg   Height:        Intake/Output Summary (Last 24 hours) at 09/04/2019 1107 Last data filed at 09/04/2019 0756 Gross per 24 hour  Intake 240 ml  Output 725 ml  Net -485 ml  Filed Weights   09/02/19 1817 09/04/19 0314  Weight: 93.4 kg 92.2 kg     Data Reviewed:   CBC: Recent Labs   Lab 09/02/19 1823 09/03/19 0836 09/04/19 0232  WBC 3.8* 5.1 4.8  NEUTROABS 1.5* 2.5  --   HGB 12.7* 12.2* 13.1  HCT 39.2 38.3* 40.7  MCV 96.6 97.7 97.4  PLT 129* 124* 833*   Basic Metabolic Panel: Recent Labs  Lab 09/02/19 1823 09/03/19 0836 09/04/19 0232  NA 130* 136 136  K 3.7 3.8 4.3  CL 94* 97* 97*  CO2 24 23 22   GLUCOSE 131* 107* 128*  BUN 49* 50* 50*  CREATININE 2.07* 2.09* 2.09*  CALCIUM 9.5 9.4 9.7  MG  --   --  2.3   GFR: Estimated Creatinine Clearance: 28.4 mL/min (A) (by C-G formula based on SCr of 2.09 mg/dL (H)). Liver Function Tests: Recent Labs  Lab 09/02/19 1823 09/03/19 0836  AST 28 46*  ALT 20 31  ALKPHOS 96 83  BILITOT 2.0* 2.2*  PROT 6.3* 6.0*  ALBUMIN 3.4* 3.1*   No results for input(s): LIPASE, AMYLASE in the last 168 hours. No results for input(s): AMMONIA in the last 168 hours. Coagulation Profile: Recent Labs  Lab 09/02/19 1823  INR 4.3*   Cardiac Enzymes: No results for input(s): CKTOTAL, CKMB, CKMBINDEX, TROPONINI in the last 168 hours. BNP (last 3 results) No results for input(s): PROBNP in the last 8760 hours. HbA1C: No results for input(s): HGBA1C in the last 72 hours. CBG: Recent Labs  Lab 09/03/19 0830 09/03/19 1159 09/03/19 1652 09/03/19 2108 09/04/19 0612  GLUCAP 108* 115* 127* 139* 121*   Lipid Profile: No results for input(s): CHOL, HDL, LDLCALC, TRIG, CHOLHDL, LDLDIRECT in the last 72 hours. Thyroid Function Tests: No results for input(s): TSH, T4TOTAL, FREET4, T3FREE, THYROIDAB in the last 72 hours. Anemia Panel: No results for input(s): VITAMINB12, FOLATE, FERRITIN, TIBC, IRON, RETICCTPCT in the last 72 hours. Sepsis Labs: No results for input(s): PROCALCITON, LATICACIDVEN in the last 168 hours.  Recent Results (from the past 240 hour(s))  SARS CORONAVIRUS 2 (TAT 6-24 HRS) Nasopharyngeal Nasopharyngeal Swab     Status: None   Collection Time: 09/03/19  1:59 AM   Specimen: Nasopharyngeal Swab  Result  Value Ref Range Status   SARS Coronavirus 2 NEGATIVE NEGATIVE Final    Comment: (NOTE) SARS-CoV-2 target nucleic acids are NOT DETECTED. The SARS-CoV-2 RNA is generally detectable in upper and lower respiratory specimens during the acute phase of infection. Negative results do not preclude SARS-CoV-2 infection, do not rule out co-infections with other pathogens, and should not be used as the sole basis for treatment or other patient management decisions. Negative results must be combined with clinical observations, patient history, and epidemiological information. The expected result is Negative. Fact Sheet for Patients: SugarRoll.be Fact Sheet for Healthcare Providers: https://www.woods-mathews.com/ This test is not yet approved or cleared by the [place] FDA and  has been authorized for detection and/or diagnosis of SARS-CoV-2 by FDA under an Emergency Use Authorization (EUA). This EUA will remain  in effect (meaning this test can be used) for the duration of the COVID-19 declaration under Section 56 4(b)(1) of the Act, 21 U.S.C. section 360bbb-3(b)(1), unless the authorization is terminated or revoked sooner. Performed at [facility], [place] [address] [place], [place] [address]   MRSA PCR Screening     Status: None   Collection Time: 09/03/19  6:34 PM   Specimen: Nasal Mucosa; Nasopharyngeal  Result Value Ref Range Status   MRSA by PCR NEGATIVE NEGATIVE Final    Comment:        The GeneXpert MRSA Assay (FDA approved for NASAL specimens only), is one component of a comprehensive MRSA colonization surveillance program. It is not intended to diagnose MRSA infection nor to guide or monitor treatment for MRSA infections. Performed at [facility], [place] [address] [place], [place] [address]          Radiology Studies: DG Chest Portable 1 View  Result Date: 09/02/2019 CLINICAL DATA:  Shortness of breath EXAM:  PORTABLE CHEST 1 VIEW COMPARISON:  01/31/2018 FINDINGS: Left AICD remains in place, unchanged. Cardiomegaly, prior CABG. Small right pleural effusion. Bibasilar opacities, likely atelectasis. Mild vascular congestion. No overt edema. IMPRESSION: Cardiomegaly, vascular congestion. Small right pleural effusion.  Bibasilar atelectasis. Electronically Signed   By: Rolm Baptise M.D.   On: 09/02/2019 19:08   ECHOCARDIOGRAM COMPLETE  Result Date: 09/03/2019    ECHOCARDIOGRAM REPORT   Patient Name:   QUARAN KEDZIERSKI Bloodsworth Date of Exam: 09/03/2019 Medical Rec #:  [id number]          Height:       72.0 in Accession #:    [id number]         Weight:       206.0 lb Date of Birth:  05-Jun-1932          BSA:          2.157 m Patient Age:    40 years           BP:           105/70 mmHg Patient Gender: M                  HR:           91 bpm. Exam Location:  Inpatient Procedure: 2D Echo Indications:    CHF-Acute Systolic H08.65  History:        Patient has prior history of Echocardiogram examinations, most                 recent 01/24/2018. CAD; Prior CABG.  Sonographer:    Mikki Santee RDCS (AE) Referring Phys: [id number] [place]  1. There is no left ventricular thrombus. Left ventricular ejection fraction, by estimation, is 15-20%. The left ventricle has severely decreased function. The left ventricle demonstrates global hypokinesis. The left ventricular internal cavity size was  moderately dilated. Left ventricular diastolic function could not be evaluated.  2. Right ventricular systolic function is severely reduced. The right ventricular size is mildly enlarged. There is moderately elevated pulmonary artery systolic pressure.  3. Left atrial size was severely dilated.  4. Right atrial size was severely dilated.  5. Large pleural effusion in the left lateral region.  6. The mitral valve is normal in structure. Moderate to severe mitral valve regurgitation.  7. Tricuspid valve regurgitation is moderate to  severe.  8. The aortic valve is normal in structure. Aortic valve regurgitation is not visualized. FINDINGS  Left Ventricle: There is no left ventricular thrombus. Left ventricular ejection fraction, by estimation, is 15-20%. The left ventricle has severely decreased function. The left ventricle demonstrates global hypokinesis. Definity contrast agent was given IV to delineate the left ventricular endocardial borders. The left ventricular internal cavity size was moderately dilated. There is no left ventricular hypertrophy. Abnormal (paradoxical) septal motion, consistent with left bundle branch block. Left ventricular diastolic function could not be  evaluated due to atrial fibrillation. Left ventricular diastolic function could not be evaluated. Right Ventricle: The right ventricular size is mildly enlarged. No increase in right ventricular wall thickness. Right ventricular systolic function is severely reduced. There is moderately elevated pulmonary artery systolic pressure. The tricuspid regurgitant velocity is 3.21 m/s, and with an assumed right atrial pressure of 15 mmHg, the estimated right ventricular systolic pressure is 07.3 mmHg. Left Atrium: Left atrial size was severely dilated. Right Atrium: Right atrial size was severely dilated. Pericardium: There is no evidence of pericardial effusion. Mitral Valve: The mitral valve is normal in structure. Moderate to severe mitral valve regurgitation, with centrally-directed jet. Tricuspid Valve: The tricuspid valve is normal in structure. Tricuspid valve regurgitation is moderate to severe. Aortic Valve: The aortic valve is normal in structure. Aortic valve regurgitation is not visualized. Pulmonic Valve: The pulmonic valve was normal in structure. Pulmonic valve regurgitation is mild. Aorta: The aortic root and ascending aorta are structurally normal, with no evidence of dilitation. IAS/Shunts: No atrial level shunt detected by color flow Doppler. Additional  Comments: A pacer wire is visualized. There is a large pleural effusion in the left lateral region.  LEFT VENTRICLE PLAX 2D LVIDd:         6.60 cm LVIDs:         6.00 cm LV PW:         1.00 cm LV IVS:        1.00 cm LVOT diam:     2.30 cm LV SV:         18 LV SV Index:   8 LVOT Area:     4.15 cm  RIGHT VENTRICLE RV S prime:     5.63 cm/s TAPSE (M-mode): 1.2 cm LEFT ATRIUM              Index       RIGHT ATRIUM           Index LA diam:        5.40 cm  2.50 cm/m  RA Area:     32.00 cm LA Vol (A2C):   115.0 ml 53.30 ml/m RA Volume:   127.00 ml 58.86 ml/m LA Vol (A4C):   122.0 ml 56.55 ml/m LA Biplane Vol: 122.0 ml 56.55 ml/m  AORTIC VALVE LVOT Vmax:   32.93 cm/s LVOT Vmean:  22.633 cm/s LVOT VTI:    0.043 m  AORTA Ao Root diam: 3.10 cm Ao Asc diam:  3.70 cm MR Peak grad: 58.1 mmHg   TRICUSPID VALVE MR Mean grad: 35.7 mmHg   TR Peak grad:   41.2 mmHg MR Vmax:      381.00 cm/s TR Vmax:        321.00 cm/s MR Vmean:     278.7 cm/s                           SHUNTS                           Systemic VTI:  0.04 m                           Systemic Diam: 2.30 cm Dani Gobble Croitoru MD Electronically signed by Sanda Klein MD Signature Date/Time: 09/03/2019/5:10:50 PM    Final    [place] EKG SITE RITE  Result Date: 09/03/2019 If Site Rite image not attached, placement could  not be confirmed due to current cardiac rhythm.       Scheduled Meds: . Chlorhexidine Gluconate Cloth  6 each Topical Daily  . ciprofloxacin  250 mg Oral BID  . donepezil  5 mg Oral QHS  . ezetimibe  10 mg Oral Daily  . feeding supplement (ENSURE ENLIVE)  237 mL Oral BID BM  . furosemide  40 mg Intravenous Q12H  . insulin aspart  0-6 Units Subcutaneous TID WC  . latanoprost  1 drop Both Eyes QHS  . multivitamin with minerals  1 tablet Oral Daily  . rosuvastatin  10 mg Oral QHS  . sodium chloride flush  10-40 mL Intracatheter Q12H  . tamsulosin  0.4 mg Oral QHS   Continuous Infusions:   LOS: 2 days   Time spent= 35 mins    Zynia Wojtowicz  Arsenio Loader, MD [facility]  If 7PM-7AM, please contact night-coverage  09/04/2019, 11:07 AM

## 2019-09-04 NOTE — Progress Notes (Signed)
Repeat co-ox 40%. Will start milrinone 0.25 mcg/kg/min. Follow co-ox closely. Repeat in 3 hrs.

## 2019-09-04 NOTE — Consult Note (Addendum)
Advanced Heart Failure Team Consult Note   Primary Physician: Idelle Crouch, MD PCP-Cardiologist:  Dr. Stann Mainland ([facility]) AHF: New (Dr. Aundra Dubin)   Reason for Consultation: Acute on Chronic Biventricular Heart Failure  HPI:    Luis Palmer is seen today for evaluation of Acute on Chronic Biventricular Heart Failure at the request of Dr. Johnsie Cancel, Cardiology.   86 y/o male, followed by [facility], w/ h/o CAD s/p remote MI treated w/ CABGx 2 in 1986, chronic biventricular systolic HF 2/2 ischemic cardiomyopathy (LVEF 17-49%, RV systolic function severely reduced), s/p dual chamber CRT-D (Medtronic), mod-severe MR, mod-severe TR,  chronic atrial fibrillation on chronic a/c w/ Xarelto, OSA on CPAP, T2DM, Stage IV CKD (baseline SCr 1.3-1.4), dementia and h/o colon cancer treated w/ resection in 2019.   Admitted 4/6 w/ a/c CHF w/ NYHA Class IIIb-IV symptoms, marked volume overload and signs of low output. Endorses progressive functional decline and 50 lb wt loss in the last 9 months. BNP > 4,500. CXR w/ cardiomegaly + vascular congestion and small rt pleural effusion. SCr 2.07 on admit (baseline ~1.3). AST mildly elevated at 46. Total bili 2.2. Hs trop low level and flat, 27>>27. No ischemic CP. Failing home diuretics w/ minimal UOP. No ICD shocks.   Per review of home meds, he was on minimal GDMT prior to arrival. Only on Entresto 24-26 bid. Home diuretic regimen = torsemide 30 daily.    2D Echo 09/03/19 1. There is no left ventricular thrombus. Left ventricular ejection fraction, by estimation, is 15- 20%. The left ventricle has severely decreased function. The left ventricle demonstrates global hypokinesis. The left ventricular internal cavity size was moderately dilated. Left ventricular diastolic function could not be evaluated. 2. Right ventricular systolic function is severely reduced. The right ventricular size is mildly enlarged. There is moderately elevated pulmonary artery  systolic pressure. 3. Left atrial size was severely dilated. 4. Right atrial size was severely dilated. 5. Large pleural effusion in the left lateral region. 6. The mitral valve is normal in structure. Moderate to severe mitral valve regurgitation. 7. Tricuspid valve regurgitation is moderate to severe. 8. The aortic valve is normal in structure. Aortic valve regurgitation is not visualized.  Review of Systems: [y] = yes, [ ]  = no   . General: Weight gain [ ] ; Weight loss [ ] ; Anorexia [ ] ; Fatigue [ ] ; Fever [ ] ; Chills [ ] ; Weakness [ ]   . Cardiac: Chest pain/pressure [ ] ; Resting SOB [ ] ; Exertional SOB [ ] ; Orthopnea [ ] ; Pedal Edema [ ] ; Palpitations [ ] ; Syncope [ ] ; Presyncope [ ] ; Paroxysmal nocturnal dyspnea[ ]   . Pulmonary: Cough [ ] ; Wheezing[ ] ; Hemoptysis[ ] ; Sputum [ ] ; Snoring [ ]   . GI: Vomiting[ ] ; Dysphagia[ ] ; Melena[ ] ; Hematochezia [ ] ; Heartburn[ ] ; Abdominal pain [ ] ; Constipation [ ] ; Diarrhea [ ] ; BRBPR [ ]   . GU: Hematuria[ ] ; Dysuria [ ] ; Nocturia[ ]   . Vascular: Pain in legs with walking [ ] ; Pain in feet with lying flat [ ] ; Non-healing sores [ ] ; Stroke [ ] ; TIA [ ] ; Slurred speech [ ] ;  . Neuro: Headaches[ ] ; Vertigo[ ] ; Seizures[ ] ; Paresthesias[ ] ;Blurred vision [ ] ; Diplopia [ ] ; Vision changes [ ]   . Ortho/Skin: Arthritis [ ] ; Joint pain [ ] ; Muscle pain [ ] ; Joint swelling [ ] ; Back Pain [ ] ; Rash [ ]   . Psych: Depression[ ] ; Anxiety[ ]   . Heme: Bleeding problems [ ] ; Clotting disorders [ ] ;  Anemia [ ]   . Endocrine: Diabetes [ ] ; Thyroid dysfunction[ ]   Home Medications Prior to Admission medications   Medication Sig Start Date End Date Taking? Authorizing Provider  acetaminophen (TYLENOL) 500 MG tablet Take 2 tablets (1,000 mg total) by mouth every 8 (eight) hours. Patient taking differently: Take 1,000 mg by mouth daily as needed for headache (pain).  02/12/18  Yes Cristal Deer, MD  canagliflozin (INVOKANA) 100 MG TABS tablet Take by mouth. 04/24/19  04/23/20 Yes [provider]  ciprofloxacin (CIPRO) 250 MG tablet Take 1 tablet (250 mg total) by mouth daily. 08/27/19  Yes Ralene Bathe, MD  donepezil (ARICEPT) 5 MG tablet Take 5 mg by mouth at bedtime.  06/11/19  Yes [provider]  ezetimibe (ZETIA) 10 MG tablet Take 10 mg by mouth daily.    Yes [provider]  guaiFENesin (MUCINEX PO) Take 1 tablet by mouth 2 (two) times daily.   Yes [provider]  hydrocortisone 2.5 % cream Apply 1 application topically daily as needed (rash/irritation).  06/06/19  Yes [provider]  latanoprost (XALATAN) 0.005 % ophthalmic solution Place 1 drop into both eyes at bedtime.  12/14/18  Yes [provider]  Multiple Vitamin (MULTIVITAMIN WITH MINERALS) TABS tablet Take 1 tablet by mouth daily. 02/13/18  Yes Cristal Deer, MD  Multiple Vitamins-Minerals (ZINC PO) Take 1 tablet by mouth daily.   Yes [provider]  rivaroxaban (XARELTO) 10 MG TABS tablet Take 10 mg by mouth daily.   Yes [provider]  rosuvastatin (CRESTOR) 10 MG tablet Take 10 mg by mouth at bedtime.  12/30/17  Yes [provider]  sacubitril-valsartan (ENTRESTO) 24-26 MG Take 1 tablet by mouth 2 (two) times daily.   Yes [provider]  sodium chloride (OCEAN) 0.65 % nasal spray Place 1 spray into the nose as needed for congestion.  06/12/19  Yes [provider]  tamsulosin (FLOMAX) 0.4 MG CAPS capsule Take 1 capsule (0.4 mg total) by mouth at bedtime. 02/12/18  Yes Cristal Deer, MD  torsemide (DEMADEX) 20 MG tablet Take 30 mg by mouth daily.  06/10/19  Yes [provider]  VITAMIN D PO Take 1 capsule by mouth daily.    Yes [provider]    Past Medical History: Past Medical History:  Diagnosis Date  . AICD (automatic cardioverter/defibrillator) present 2006  . Anemia   . Atrial fibrillation ([place])   . CHF (congestive heart failure) ([place])   . CKD (chronic kidney  disease), stage III   . Colon cancer ([place])    "dx'd 01/24/2018"  . High cholesterol   . History of blood transfusion 01/22/2018   "I lost blood; HgB extremely low"  . History of gout   . Myocardial infarction ([place]) 1986  . OSA on CPAP   . Pneumonia 2015  . Type II diabetes mellitus ([place])     Past Surgical History: Past Surgical History:  Procedure Laterality Date  . APPENDECTOMY    . ATRIAL FIBRILLATION ABLATION  X 2  . CARDIAC CATHETERIZATION    . CARDIAC DEFIBRILLATOR PLACEMENT  2006   "added lead and had battery changed once since the initial OR" (01/29/2018)  . CATARACT EXTRACTION W/ INTRAOCULAR LENS  IMPLANT, BILATERAL Bilateral   . COLONOSCOPY WITH PROPOFOL N/A 01/23/2018   Procedure: COLONOSCOPY WITH PROPOFOL;  Surgeon: Lucilla Lame, MD;  Location: [facility] ENDOSCOPY;  Service: Endoscopy;  Laterality: N/A;  . CORONARY ARTERY BYPASS GRAFT  1986   "CABG X2"  .  CYSTOSCOPY N/A 01/30/2018   Procedure: CYSTOSCOPY FLEXIBLE WITH INSERTION OF CATHETER;  Surgeon: Jovita Kussmaul, MD;  Location: [place];  Service: General;  Laterality: N/A;  . ESOPHAGOGASTRODUODENOSCOPY (EGD) WITH PROPOFOL N/A 01/22/2018   Procedure: ESOPHAGOGASTRODUODENOSCOPY (EGD) WITH PROPOFOL;  Surgeon: Lucilla Lame, MD;  Location: [facility] ENDOSCOPY;  Service: Endoscopy;  Laterality: N/A;  . INSERT / REPLACE / REMOVE PACEMAKER    . LAPAROSCOPIC PARTIAL COLECTOMY N/A 01/30/2018   Procedure: LAPAROSCOPIC ASSISTED EXTENDED RIGHT COLECTOMY;  Surgeon: Jovita Kussmaul, MD;  Location: [facility] OR;  Service: General;  Laterality: N/A;  . NASAL SINUS SURGERY      Family History: Family History  Problem Relation Age of Onset  . Congestive Heart Failure Mother   . Diabetes Father   . Stroke Father   . Lung cancer Brother 60  . Skin cancer Brother        squamous cell  . Heart attack Maternal Uncle     Social History: Social History   Socioeconomic History  . Marital status: Married    Spouse name: Lelon Frohlich  . Number of children: 4  .  Years of education: Not on file  . Highest education level: Not on file  Occupational History  . Not on file  Tobacco Use  . Smoking status: Former Smoker    Packs/day: 1.00    Years: 40.00    Pack years: 40.00    Types: Cigarettes    Quit date: 1986    Years since quitting: 35.2  . Smokeless tobacco: Never Used  Substance and Sexual Activity  . Alcohol use: Not Currently  . Drug use: Never  . Sexual activity: Not on file  Other Topics Concern  . Not on file  Social History Narrative  . Not on file   Social Determinants of Health   Financial Resource Strain:   . Difficulty of Paying Living Expenses:   Food Insecurity:   . Worried About Charity fundraiser in the Last Year:   . Arboriculturist in the Last Year:   Transportation Needs:   . Film/video editor (Medical):   Marland Kitchen Lack of Transportation (Non-Medical):   Physical Activity:   . Days of Exercise per Week:   . Minutes of Exercise per Session:   Stress:   . Feeling of Stress :   Social Connections:   . Frequency of Communication with Friends and Family:   . Frequency of Social Gatherings with Friends and Family:   . Attends Religious Services:   . Active Member of Clubs or Organizations:   . Attends Archivist Meetings:   Marland Kitchen Marital Status:     Allergies:  Allergies  Allergen Reactions  . Pseudoephedrine Hcl Other (See Comments)    Other Reaction: ANURIA  . Ace Inhibitors Other (See Comments) and Cough  . Nsaids Other (See Comments)    Reaction:  Fluid retention   . Vasotec [Enalapril] Cough    Objective:    Vital Signs:   Temp:  [96.4 F (35.8 C)-97.6 F (36.4 C)] 97.5 F (36.4 C) (04/07 0756) Pulse Rate:  [77-95] 95 (04/07 0314) Resp:  [14-20] 17 (04/07 0314) BP: (93-108)/(65-70) 108/66 (04/07 0314) SpO2:  [94 %-100 %] 96 % (04/07 0756) Weight:  [92.2 kg] 92.2 kg (04/07 0314) Last BM Date: 09/02/19  Weight change: Filed Weights   09/02/19 1817 09/04/19 0314  Weight: 93.4 kg  92.2 kg    Intake/Output:   Intake/Output Summary (Last 24 hours) at  09/04/2019 1055 Last data filed at 09/04/2019 0756 Gross per 24 hour  Intake 240 ml  Output 725 ml  Net -485 ml      Physical Exam    General:  Chronically ill/fatigue and cachetic appearing elderly WM. No resp difficulty HEENT: normal Neck: supple. Elevated JVP to angle of jaw . Carotids 2+ bilat; no bruits. No lymphadenopathy or thyromegaly appreciated. Cor: PMI nondisplaced. Regular rate & rhythm. 3/6 MR murmur at apex Lungs: decreased BS w/ bibasilar crackles  Abdomen: soft, nontender, nondistended. No hepatosplenomegaly. No bruits or masses. Good bowel sounds. Extremities: no cyanosis, clubbing, rash, 2 + bilateral LEE up to thighs, chronic bilateral venous stasis dermatitis w/ shallow ulcers (lateral/dorsum of rt foot, medial malleolar aspect of rt foot and on great toe of lt foot) GU: + foley  Neuro: alert & orientedx3, cranial nerves grossly intact. moves all 4 extremities w/o difficulty. Affect pleasant   Telemetry   Paced rhythm 80s   EKG    Ventricular paced rhythm 79 bpm   Labs   Basic Metabolic Panel: Recent Labs  Lab 09/02/19 1823 09/03/19 0836 09/04/19 0232  NA 130* 136 136  K 3.7 3.8 4.3  CL 94* 97* 97*  CO2 24 23 22   GLUCOSE 131* 107* 128*  BUN 49* 50* 50*  CREATININE 2.07* 2.09* 2.09*  CALCIUM 9.5 9.4 9.7  MG  --   --  2.3    Liver Function Tests: Recent Labs  Lab 09/02/19 1823 09/03/19 0836  AST 28 46*  ALT 20 31  ALKPHOS 96 83  BILITOT 2.0* 2.2*  PROT 6.3* 6.0*  ALBUMIN 3.4* 3.1*   No results for input(s): LIPASE, AMYLASE in the last 168 hours. No results for input(s): AMMONIA in the last 168 hours.  CBC: Recent Labs  Lab 09/02/19 1823 09/03/19 0836 09/04/19 0232  WBC 3.8* 5.1 4.8  NEUTROABS 1.5* 2.5  --   HGB 12.7* 12.2* 13.1  HCT 39.2 38.3* 40.7  MCV 96.6 97.7 97.4  PLT 129* 124* 123*    Cardiac Enzymes: No results for input(s): CKTOTAL, CKMB,  CKMBINDEX, TROPONINI in the last 168 hours.  BNP: BNP (last 3 results) Recent Labs    09/02/19 1900  BNP >4,500.0*    ProBNP (last 3 results) No results for input(s): PROBNP in the last 8760 hours.   CBG: Recent Labs  Lab 09/03/19 0830 09/03/19 1159 09/03/19 1652 09/03/19 2108 09/04/19 0612  GLUCAP 108* 115* 127* 139* 121*    Coagulation Studies: Recent Labs    09/02/19 1823  LABPROT 41.2*  INR 4.3*     Imaging   ECHOCARDIOGRAM COMPLETE  Result Date: 09/03/2019    ECHOCARDIOGRAM REPORT   Patient Name:   AMRON GUERRETTE Zalar Date of Exam: 09/03/2019 Medical Rec #:  [id number]          Height:       72.0 in Accession #:    [id number]         Weight:       206.0 lb Date of Birth:  Feb 27, 1933          BSA:          2.157 m Patient Age:    45 years           BP:           105/70 mmHg Patient Gender: M                  HR:  91 bpm. Exam Location:  Inpatient Procedure: 2D Echo Indications:    CHF-Acute Systolic A41.66  History:        Patient has prior history of Echocardiogram examinations, most                 recent 01/24/2018. CAD; Prior CABG.  Sonographer:    Mikki Santee RDCS (AE) Referring Phys: [id number] [place]  1. There is no left ventricular thrombus. Left ventricular ejection fraction, by estimation, is 15-20%. The left ventricle has severely decreased function. The left ventricle demonstrates global hypokinesis. The left ventricular internal cavity size was  moderately dilated. Left ventricular diastolic function could not be evaluated.  2. Right ventricular systolic function is severely reduced. The right ventricular size is mildly enlarged. There is moderately elevated pulmonary artery systolic pressure.  3. Left atrial size was severely dilated.  4. Right atrial size was severely dilated.  5. Large pleural effusion in the left lateral region.  6. The mitral valve is normal in structure. Moderate to severe mitral valve regurgitation.  7.  Tricuspid valve regurgitation is moderate to severe.  8. The aortic valve is normal in structure. Aortic valve regurgitation is not visualized. FINDINGS  Left Ventricle: There is no left ventricular thrombus. Left ventricular ejection fraction, by estimation, is 15-20%. The left ventricle has severely decreased function. The left ventricle demonstrates global hypokinesis. Definity contrast agent was given IV to delineate the left ventricular endocardial borders. The left ventricular internal cavity size was moderately dilated. There is no left ventricular hypertrophy. Abnormal (paradoxical) septal motion, consistent with left bundle branch block. Left ventricular diastolic function could not be evaluated due to atrial fibrillation. Left ventricular diastolic function could not be evaluated. Right Ventricle: The right ventricular size is mildly enlarged. No increase in right ventricular wall thickness. Right ventricular systolic function is severely reduced. There is moderately elevated pulmonary artery systolic pressure. The tricuspid regurgitant velocity is 3.21 m/s, and with an assumed right atrial pressure of 15 mmHg, the estimated right ventricular systolic pressure is 10.9 mmHg. Left Atrium: Left atrial size was severely dilated. Right Atrium: Right atrial size was severely dilated. Pericardium: There is no evidence of pericardial effusion. Mitral Valve: The mitral valve is normal in structure. Moderate to severe mitral valve regurgitation, with centrally-directed jet. Tricuspid Valve: The tricuspid valve is normal in structure. Tricuspid valve regurgitation is moderate to severe. Aortic Valve: The aortic valve is normal in structure. Aortic valve regurgitation is not visualized. Pulmonic Valve: The pulmonic valve was normal in structure. Pulmonic valve regurgitation is mild. Aorta: The aortic root and ascending aorta are structurally normal, with no evidence of dilitation. IAS/Shunts: No atrial level shunt  detected by color flow Doppler. Additional Comments: A pacer wire is visualized. There is a large pleural effusion in the left lateral region.  LEFT VENTRICLE PLAX 2D LVIDd:         6.60 cm LVIDs:         6.00 cm LV PW:         1.00 cm LV IVS:        1.00 cm LVOT diam:     2.30 cm LV SV:         18 LV SV Index:   8 LVOT Area:     4.15 cm  RIGHT VENTRICLE RV S prime:     5.63 cm/s TAPSE (M-mode): 1.2 cm LEFT ATRIUM              Index  RIGHT ATRIUM           Index LA diam:        5.40 cm  2.50 cm/m  RA Area:     32.00 cm LA Vol (A2C):   115.0 ml 53.30 ml/m RA Volume:   127.00 ml 58.86 ml/m LA Vol (A4C):   122.0 ml 56.55 ml/m LA Biplane Vol: 122.0 ml 56.55 ml/m  AORTIC VALVE LVOT Vmax:   32.93 cm/s LVOT Vmean:  22.633 cm/s LVOT VTI:    0.043 m  AORTA Ao Root diam: 3.10 cm Ao Asc diam:  3.70 cm MR Peak grad: 58.1 mmHg   TRICUSPID VALVE MR Mean grad: 35.7 mmHg   TR Peak grad:   41.2 mmHg MR Vmax:      381.00 cm/s TR Vmax:        321.00 cm/s MR Vmean:     278.7 cm/s                           SHUNTS                           Systemic VTI:  0.04 m                           Systemic Diam: 2.30 cm Dani Gobble Croitoru MD Electronically signed by Sanda Klein MD Signature Date/Time: 09/03/2019/5:10:50 PM    Final    [place] EKG SITE RITE  Result Date: 09/03/2019 If Site Rite image not attached, placement could not be confirmed due to current cardiac rhythm.    Medications:     Current Medications: . Chlorhexidine Gluconate Cloth  6 each Topical Daily  . ciprofloxacin  250 mg Oral BID  . donepezil  5 mg Oral QHS  . ezetimibe  10 mg Oral Daily  . feeding supplement (ENSURE ENLIVE)  237 mL Oral BID BM  . furosemide  40 mg Intravenous Q12H  . insulin aspart  0-6 Units Subcutaneous TID WC  . latanoprost  1 drop Both Eyes QHS  . multivitamin with minerals  1 tablet Oral Daily  . rosuvastatin  10 mg Oral QHS  . sodium chloride flush  10-40 mL Intracatheter Q12H  . tamsulosin  0.4 mg Oral QHS     Infusions:    Assessment/Plan   1. Acute on Chronic Biventricular Failure - long standing h/o ischemic CM. Has been followed at [facility] - LVEF 15-20%. RV systolic function severely reduced.  - progressive functional decline over the last 9 months w/ progression to NYHA Class IIIb-IV symptoms. With volume overload and failure to respond to home diuretics + worsening renal function/ signs of low output. This is end-stage biventricular failure - he how has PICC line. Will check co-ox. Suspect he will need inotropic therapy to help push diuresis, for palliation/ relief of congestion. If low co-ox, will start low dose milrinone. Will also follow CVP to help guide diuresis - Start high dose diuretics after inotrope has been started  - BP and CKD limits GDMT  - already has a CRT-D device (Medtronic). Will interrogate to make sure he is adequately Bi-V pacing - will ask wound care to manage chronic venous stasis ulcers and apply unna boots - limited medical options given advanced age and mild dementia. Not a transplant candidate. Not a candidate for LVAD given severe RV dysfunction. Will ask palliative care team to visit  to discuss [place].  - he is DNR. Consider turning off defibrillator and continue w/ bi-v pacing   2. CAD - remote MI in 1986 treated w/ CABG x 2 - he denies ischemic CP - hs troponin flat and low level  - no ASA due to Xarelto for Afib - continue low dose Crestor + Zetia - no  blocker w/ suspected low output   3. Chronic Atrial Fibrillation - V paced. HR 80s  - holding Xarelto given liver dysfunction and risk of bleed (INR supratherapeutic at 4.6, suspect 2/2 hepatic congestion from severe RV failure)  4. Mitral Regurgitation  - mod-severe MR on echo, likely functional  5. Tricuspid Regurgitation  - mod-severe on echo, likely functional  6. Stage IV CKD - baseline SCr 1.3-1.4 - now 2.0, likely cardiorenal  - will hopefully improve w/ inotropes/ diuresis   7. Chronic  Venous Stasis Ulcers Bilateral LEs - on Cipro 250 mg bid - WOC following - needs UNNA boots  8. T2DM - management per IM   9. Dementia - on Aricept   Length of Stay: 2  Lyda Jester, PA-C  09/04/2019, 10:55 AM  Advanced Heart Failure Team Pager [phone] (M-F; 7a - 4p)  Please contact [facility] for night-coverage after hours (4p -7a ) and weekends on [email or web address]  Patient seen with PA, agree with the above note.   Patient was admitted with CHF exacerbation and AKI on CKD stage 3.  He has had worsening dyspnea since last fall, but per his wife, dyspnea and fatigue has been much worse for 3 wks.    MDT CRT-D device was interrogated today, he has primarily been in atrial fibrillation since 5/20.  He has only been BiV pacing about 84% of the time.  Optivol fluid index high since 12/20.   Prior to 5/20, it appears that he was in NSR.   Today, co-ox 80% off PICC with CVP 16.   General: NAD Neck: JVP 16 cm with prominent CV waves, no thyromegaly or thyroid nodule.  Lungs: Clear to auscultation bilaterally with normal respiratory effort. CV: Lateral PMI.  Heart regular S1/S2, no S3/S4, 3/6 HSM LLSB/apex.  1+ edema to knees.  No carotid bruit.  Difficult to palpate pedal pulses. Abdomen: Soft, nontender, no hepatosplenomegaly, no distention.  Skin: Intact without lesions or rashes.  Neurologic: Alert and oriented x 3.  Psych: Normal affect. Extremities: No clubbing or cyanosis. Ulcerations on feet.  HEENT: Normal.   1. Acute on chronic systolic CHF: Suspect primarily ischemic cardiomyopathy but has now developed RV failure in setting of atrial fibrillation and elevated PA pressure. Echo in 4/21 with EF 15-20%, moderate LV dilation, mildly dilated RV with severely decreased systolic function, moderate-severe TR, moderate-severe MR.  I was concerned for low output HF, but initial co-ox is 80% (surprising).  CVP elevated at 16. On exam, he is volume overloaded.  Device interrogation  shows suboptimal BiV pacing (84%) in setting of persistent atrial fibrillation.  - Will resend co-ox to confirm.  If low, would start him on milrinone gtt while diuresing.  - Increase Lasix to 80 mg IV bid.  - With soft BP and elevated creatinine, will hold off on other HF meds for now.   - With age and dementia, not candidate for advanced therapies.  - I would like to try to get him back into NSR, hopefully he could get some improvement from this with restoration of atrial "kick" as well as increase in percentage of BiV pacing.  2. Atrial fibrillation: Persistent.  He has had 2 atrial fibrillation ablations in the past, 5/10 and 2/12.  He was on sotalol in the past as well.  He appears to have gone into atrial fibrillation (by device interrogation) around 5/20.  No attempt was made to get him back into NSR after that.  His BiV pacing percentage has dropped.  I would like to try to get him in NSR.  - Start amiodarone 400 mg bid.  - Will start Eliquis 2.5 mg bid in preparation for TEE-guided DCCV (Xarelto 10 mg daily is inadequate dose).   - Will plan TEE-guided DCCV when better diuresed.  3. AKI on CKD stage 3: Creatinine baseline appears to be around 1.4, currently at 2.  Stable over the last couple of days.  Follow closely with diuresis.  4. CAD: S/p CABG.  No chest pain.  No recent coronary angiography.  No evidence for ACS this admission.  - Continue Crestor and Zetia.  - On anticoagulation, no ASA.  5. Venous stasis ulcers: Wound care following, unna boots.  6. Valvular heart disease: Moderate-severe MR and TR, suspect functional due to annular dilation.  7. Dementia: on Aricept.   [name] 09/04/2019 2:33 PM

## 2019-09-04 NOTE — Progress Notes (Signed)
Initial Nutrition Assessment  DOCUMENTATION CODES:   Severe malnutrition in context of chronic illness  INTERVENTION:    Ensure Enlive po BID, each supplement provides 350 kcal and 20 grams of protein  MVI daily   NUTRITION DIAGNOSIS:   Severe Malnutrition related to chronic illness(CHF) as evidenced by severe fat depletion, severe muscle depletion, energy intake < or equal to 75% for > or equal to 1 month.  GOAL:   Patient will meet greater than or equal to 90% of their needs  MONITOR:   PO intake, Supplement acceptance, Weight trends, Labs, I & O's  REASON FOR ASSESSMENT:   Malnutrition Screening Tool    ASSESSMENT:   Patient with PMH significant for CHF s/p AICD, CKD IV, DM, and colon cancer s/p resection (2019). Presents this admission with acute exacerbation of CHF.   Pt endorses having decreased appetite one month PTA due to fluid acumulation in abdomen. Often experiences early satiety. During this time period he consumed two meals daily that consisted of B- oatmeal, fruit D- deli sandwich. Started drinking 1-2 premier protein daily. Discussed the importance of continued protein intake for preservation of lean body mass. Encouraged wife/pt to buy high calorie high protein supplements given recent dry weight loss.   Pt endorses a UBW of 220 lb and a recent weight loss of 60 lb. Records limited in weight over the last year. Suspect fluid accumulation is masking losses.   I/O: -925 ml since admit  UOP: 1,225 ml x 24 hrs   Medications: 80 mg lasix BID, SS novolog, MVI with minerals  Labs: CBG 121-139  NUTRITION - FOCUSED PHYSICAL EXAM:    Most Recent Value  Orbital Region  Moderate depletion  Upper Arm Region  Severe depletion  Thoracic and Lumbar Region  Severe depletion  Buccal Region  Mild depletion  Temple Region  Moderate depletion  Clavicle Bone Region  Severe depletion  Clavicle and Acromion Bone Region  Severe depletion  Scapular Bone Region  Severe  depletion  Dorsal Hand  Moderate depletion  Patellar Region  Unable to assess  Anterior Thigh Region  Unable to assess  Posterior Calf Region  Unable to assess  Edema (RD Assessment)  Unable to assess  Hair  Reviewed  Eyes  Reviewed  Mouth  Reviewed  Skin  Reviewed  Nails  Reviewed     Diet Order:   Diet Order            Diet heart healthy/carb modified Room service appropriate? Yes; Fluid consistency: Thin; Fluid restriction: 1200 mL Fluid  Diet effective now              EDUCATION NEEDS:   Education needs have been addressed  Skin:  Skin Assessment: Reviewed RN Assessment  Last BM:  4/5  Height:   Ht Readings from Last 1 Encounters:  09/02/19 6' (1.829 m)    Weight:   Wt Readings from Last 1 Encounters:  09/04/19 92.2 kg    BMI:  Body mass index is 27.57 kg/m.  Estimated Nutritional Needs:   Kcal:  2200-2400 kcal  Protein:  110-125 grams  Fluid:  1.2 fluid restriction   Mariana Single RD, LDN Clinical Nutrition Pager listed in [place]

## 2019-09-04 NOTE — Progress Notes (Signed)
Physical Therapy Treatment Patient Details Name: Luis Palmer MRN: [id number] DOB: January 23, 1933 Today's Date: 09/04/2019    History of Present Illness Pt is an 86 y/o male admitted secondary to increased SOB and edema. Thought to be secondary to CHF exacerbation. PMH includes CAD s/p CABG, colon cancer, CKD, CHF, a fib, DM, s/p defibrillator.     PT Comments    Session focused on LE strengthening per pt request as fatigued from ambulation with OT in the room for ADL's earlier today.  Some reinforcement of energy conservation education performed as well.  Patient appropriate for home with family support.  PT to follow acutely.   Follow Up Recommendations  Home health PT;Supervision/Assistance - 24 hour     Equipment Recommendations  None recommended by PT    Recommendations for Other Services       Precautions / Restrictions Precautions Precautions: Fall Restrictions Weight Bearing Restrictions: No    Mobility  Bed Mobility               General bed mobility comments: up at EOB  Transfers Overall transfer level: Needs assistance Equipment used: Rolling walker (2 wheeled) Transfers: Sit to/from Stand Sit to Stand: Min assist;From elevated surface         General transfer comment: raised height of bed due to hips lower than knees, increased time to rise, but not much help needed  Ambulation/Gait             General Gait Details: deferred due to fatigue from working withOT earlier   [employer] Rankin (Stroke Patients Only)       Balance Overall balance assessment: Needs assistance   Sitting balance-Leahy Scale: Good     Standing balance support: Bilateral upper extremity supported Standing balance-Leahy Scale: Poor Standing balance comment: Reliant on BUE support                            Cognition Arousal/Alertness: Awake/alert Behavior During Therapy: WFL for tasks  assessed/performed Overall Cognitive Status: Impaired/Different from baseline Area of Impairment: Orientation                 Orientation Level: Situation   Memory: Decreased short-term memory       Problem Solving: Slow processing General Comments: reports here due to swelling in his legs, then talks about his diabetes, tangential describing his issues with energy to mowing the lawn      Exercises General Exercises - Lower Extremity Hip Flexion/Marching: Strengthening;10 reps;Standing Heel Raises: Strengthening;10 reps;Standing Mini-Sqauts: Strengthening;10 reps;Standing Other Exercises Other Exercises: seated scapular squeezes x 10 with 5 sec hold Other Exercises: seated trunk flex/ext for core strength arms crossed over chest x 10    General Comments General comments (skin integrity, edema, etc.): wife in the room      Pertinent Vitals/Pain Pain Assessment: Faces Faces Pain Scale: Hurts little more Pain Location: R knee prior to exercise Pain Descriptors / Indicators: Aching Pain Intervention(s): Monitored during session;Limited activity within patient's tolerance    Home Living Family/patient expects to be discharged to:: Private residence Living Arrangements: Spouse/significant other Available Help at Discharge: Family;Available 24 hours/day Type of Home: House Home Access: Level entry   Home Layout: One level Home Equipment: Walker - 2 wheels;Toilet riser;Cane - single point;Grab bars - tub/shower      Prior Function Level of Independence: Independent  with assistive device(s)      Comments: Has been using RW since being weak. Used cane prior to that.    PT Goals (current goals can now be found in the care plan section) Progress towards PT goals: Progressing toward goals    Frequency    Min 3X/week      PT Plan Current plan remains appropriate    Co-evaluation              AM-PAC PT "6 Clicks" Mobility   Outcome Measure  Help needed  turning from your back to your side while in a flat bed without using bedrails?: None Help needed moving from lying on your back to sitting on the side of a flat bed without using bedrails?: None Help needed moving to and from a bed to a chair (including a wheelchair)?: A Little Help needed standing up from a chair using your arms (e.g., wheelchair or bedside chair)?: A Little Help needed to walk in hospital room?: A Little Help needed climbing 3-5 steps with a railing? : A Lot 6 Click Score: 19    End of Session   Activity Tolerance: Patient tolerated treatment well Patient left: in bed;with call bell/phone within reach;with family/visitor present(wished ro remain seated EOB)   PT Visit Diagnosis: Unsteadiness on feet (R26.81);Muscle weakness (generalized) (M62.81)     Time: 3709-6438 PT Time Calculation (min) (ACUTE ONLY): 26 min  Charges:  $Therapeutic Exercise: 8-22 mins $Therapeutic Activity: 8-22 mins                     Magda Kiel, PT Acute Rehabilitation Services [phone] 09/04/2019    [name] 09/04/2019, 2:14 PM

## 2019-09-04 NOTE — Progress Notes (Signed)
Peripherally Inserted Central Catheter Placement  The IV Nurse has discussed with the patient and/or persons authorized to consent for the patient, the purpose of this procedure and the potential benefits and risks involved with this procedure.  The benefits include less needle sticks, lab draws from the catheter, and the patient may be discharged home with the catheter. Risks include, but not limited to, infection, bleeding, blood clot (thrombus formation), and puncture of an artery; nerve damage and irregular heartbeat and possibility to perform a PICC exchange if needed/ordered by physician.  Alternatives to this procedure were also discussed.  Bard Power PICC patient education guide, fact sheet on infection prevention and patient information card has been provided to patient /or left at bedside.    PICC Placement Documentation  PICC Double Lumen 11/20/74 PICC Right Basilic 40 cm 1 cm (Active)  Indication for Insertion or Continuance of Line Vasoactive infusions 09/04/19 0900  Exposed Catheter (cm) 1 cm 09/04/19 0900  Site Assessment Clean;Dry;Intact 09/04/19 0900  Lumen #1 Status Flushed;Blood return noted;Saline locked 09/04/19 0900  Lumen #2 Status Flushed;Blood return noted;Saline locked 09/04/19 0900  Dressing Type Transparent;Securing device 09/04/19 0900  Dressing Status Clean;Dry;Intact;Antimicrobial disc in place 09/04/19 0900  Dressing Change Due 09/11/19 09/04/19 0900       Frances Maywood 09/04/2019, 9:03 AM

## 2019-09-05 ENCOUNTER — Inpatient Hospital Stay (HOSPITAL_COMMUNITY): Payer: Medicare Other

## 2019-09-05 DIAGNOSIS — E43 Unspecified severe protein-calorie malnutrition: Secondary | ICD-10-CM | POA: Insufficient documentation

## 2019-09-05 DIAGNOSIS — I5023 Acute on chronic systolic (congestive) heart failure: Secondary | ICD-10-CM | POA: Diagnosis not present

## 2019-09-05 DIAGNOSIS — R52 Pain, unspecified: Secondary | ICD-10-CM | POA: Diagnosis not present

## 2019-09-05 DIAGNOSIS — M7989 Other specified soft tissue disorders: Secondary | ICD-10-CM | POA: Diagnosis not present

## 2019-09-05 LAB — GLUCOSE, CAPILLARY
Glucose-Capillary: 126 mg/dL — ABNORMAL HIGH (ref 70–99)
Glucose-Capillary: 147 mg/dL — ABNORMAL HIGH (ref 70–99)
Glucose-Capillary: 202 mg/dL — ABNORMAL HIGH (ref 70–99)
Glucose-Capillary: 174 mg/dL — ABNORMAL HIGH (ref 70–99)

## 2019-09-05 LAB — BASIC METABOLIC PANEL
Anion gap: 11 (ref 5–15)
BUN: 48 mg/dL — ABNORMAL HIGH (ref 8–23)
CO2: 28 mmol/L (ref 22–32)
Calcium: 9.1 mg/dL (ref 8.9–10.3)
Chloride: 99 mmol/L (ref 98–111)
Creatinine, Ser: 1.82 mg/dL — ABNORMAL HIGH (ref 0.61–1.24)
GFR calc Af Amer: 38 mL/min — ABNORMAL LOW (ref >60)
GFR calc non Af Amer: 33 mL/min — ABNORMAL LOW (ref >60)
Glucose, Bld: 140 mg/dL — ABNORMAL HIGH (ref 70–99)
Potassium: 3.3 mmol/L — ABNORMAL LOW (ref 3.5–5.1)
Sodium: 138 mmol/L (ref 135–145)

## 2019-09-05 LAB — MAGNESIUM: Magnesium: 2.1 mg/dL (ref 1.7–2.4)

## 2019-09-05 LAB — COOXEMETRY PANEL
Carboxyhemoglobin: 2.3 % — ABNORMAL HIGH (ref 0.5–1.5)
Methemoglobin: 1 % (ref 0.0–1.5)
O2 Saturation: 69 %
Total hemoglobin: 11.7 g/dL — ABNORMAL LOW (ref 12.0–16.0)

## 2019-09-05 LAB — CBC
HCT: 34.5 % — ABNORMAL LOW (ref 39.0–52.0)
Hemoglobin: 11.5 g/dL — ABNORMAL LOW (ref 13.0–17.0)
MCH: 31.7 pg (ref 26.0–34.0)
MCHC: 33.3 g/dL (ref 30.0–36.0)
MCV: 95 fL (ref 80.0–100.0)
Platelets: 112 10*3/uL — ABNORMAL LOW (ref 150–400)
RBC: 3.63 MIL/uL — ABNORMAL LOW (ref 4.22–5.81)
RDW: 18.1 % — ABNORMAL HIGH (ref 11.5–15.5)
WBC: 5.4 10*3/uL (ref 4.0–10.5)
nRBC: 0 % (ref 0.0–0.2)

## 2019-09-05 MED ORDER — DICLOFENAC SODIUM 1 % EX GEL
4.0000 g | Freq: Four times a day (QID) | CUTANEOUS | Status: DC
  Administered 2019-09-05 – 2019-09-08 (×12): 4 g via TOPICAL
  Filled 2019-09-05: qty 100

## 2019-09-05 MED ORDER — POTASSIUM CHLORIDE CRYS ER 20 MEQ PO TBCR
40.0000 meq | EXTENDED_RELEASE_TABLET | Freq: Once | ORAL | Status: AC
  Administered 2019-09-05: 40 meq via ORAL
  Filled 2019-09-05: qty 2

## 2019-09-05 MED ORDER — MIDODRINE HCL 5 MG PO TABS
5.0000 mg | ORAL_TABLET | Freq: Three times a day (TID) | ORAL | Status: DC
  Administered 2019-09-05 – 2019-09-06 (×4): 5 mg via ORAL
  Filled 2019-09-05 (×4): qty 1

## 2019-09-05 NOTE — Progress Notes (Signed)
RN paged PA to advise of low BP's. Brittinay, PA advised to give Lasix if systolic over 90. RN gave Lasix. RN will continue to monitor.

## 2019-09-05 NOTE — Progress Notes (Signed)
Physical Therapy Treatment Patient Details Name: Luis Palmer MRN: [id number] DOB: 06-10-1932 Today's Date: 09/05/2019    History of Present Illness Pt is an 86 y/o male admitted secondary to increased SOB and edema. Thought to be secondary to CHF exacerbation. PMH includes CAD s/p CABG, colon cancer, CKD, CHF, a fib, DM, s/p defibrillator.     PT Comments    Patient unable to progress much due to pain in R knee and hypotension.  Patient eager and pushing to participate despite issues.  Wife supportive and able to assist some.  Feel R knee swelling possibly due to standing exercise yesterday.  PT to follow acutely.    Follow Up Recommendations  Home health PT;Supervision/Assistance - 24 hour     Equipment Recommendations  None recommended by PT    Recommendations for Other Services       Precautions / Restrictions Precautions Precautions: Fall    Mobility  Bed Mobility Overal bed mobility: Needs Assistance Bed Mobility: Supine to Sit     Supine to sit: Min assist;HOB elevated     General bed mobility comments: assist for guiding legs off bed  Transfers Overall transfer level: Needs assistance Equipment used: Rolling walker (2 wheeled) Transfers: Sit to/from Stand Sit to Stand: Mod assist;From elevated surface         General transfer comment: lifting help to stand today with pain R knee  Ambulation/Gait Ambulation/Gait assistance: Min assist Gait Distance (Feet): 2 Feet Assistive device: Rolling walker (2 wheeled) Gait Pattern/deviations: Step-to pattern;Antalgic     General Gait Details: side steps to [facility] only due to knee pain   Stairs             Wheelchair Mobility    Modified Rankin (Stroke Patients Only)       Balance Overall balance assessment: Needs assistance Sitting-balance support: Feet supported Sitting balance-Leahy Scale: Fair Sitting balance - Comments: flexed posture sitting  EOB   Standing balance support: Bilateral  upper extremity supported Standing balance-Leahy Scale: Poor Standing balance comment: Reliant on BUE support                            Cognition Arousal/Alertness: Awake/alert Behavior During Therapy: WFL for tasks assessed/performed   Area of Impairment: Safety/judgement;Attention;Memory;Problem solving                   Current Attention Level: Sustained Memory: Decreased short-term memory   Safety/Judgement: Decreased awareness of deficits   Problem Solving: Slow processing;Requires verbal cues        Exercises General Exercises - Upper Extremity Shoulder Flexion: AROM;10 reps;Seated Elbow Flexion: AROM;10 reps;Seated General Exercises - Lower Extremity Ankle Circles/Pumps: AROM;20 reps;Seated Other Exercises Other Exercises: seated scapular squeezes x 10 with 5 sec hold Other Exercises: seated trunk flex/ext for core strength arms crossed over chest x 10    General Comments General comments (skin integrity, edema, etc.): Patient with tender, swollen and warm R knee.  Patient with BP seated after exercise 91/49, after standing 86/50,  RN aware, pt not symptomatic      Pertinent Vitals/Pain Pain Assessment: Faces Faces Pain Scale: Hurts whole lot Pain Location: R knee with standing Pain Descriptors / Indicators: Grimacing;Aching Pain Intervention(s): Monitored during session;Limited activity within patient's tolerance;Repositioned    Home Living                      Prior Function  PT Goals (current goals can now be found in the care plan section) Progress towards PT goals: Not progressing toward goals - comment    Frequency    Min 3X/week      PT Plan Current plan remains appropriate    Co-evaluation              AM-PAC PT "6 Clicks" Mobility   Outcome Measure  Help needed turning from your back to your side while in a flat bed without using bedrails?: None Help needed moving from lying on your back to  sitting on the side of a flat bed without using bedrails?: None Help needed moving to and from a bed to a chair (including a wheelchair)?: A Little Help needed standing up from a chair using your arms (e.g., wheelchair or bedside chair)?: A Lot Help needed to walk in hospital room?: A Little Help needed climbing 3-5 steps with a railing? : A Lot 6 Click Score: 18    End of Session   Activity Tolerance: Patient limited by pain Patient left: in bed;with call bell/phone within reach;with family/visitor present(seated EOB wife beside him)   PT Visit Diagnosis: Unsteadiness on feet (R26.81);Muscle weakness (generalized) (M62.81)     Time: 1440-1520 PT Time Calculation (min) (ACUTE ONLY): 40 min  Charges:  $Therapeutic Exercise: 8-22 mins $Therapeutic Activity: 8-22 mins                     Luis Palmer, [place] [facility] [phone] 09/05/2019    Luis Palmer 09/05/2019, 4:50 PM

## 2019-09-05 NOTE — Plan of Care (Signed)
  Problem: Clinical Measurements: °Goal: Ability to maintain clinical measurements within normal limits will improve °Outcome: Progressing °  °Problem: Nutrition: °Goal: Adequate nutrition will be maintained °Outcome: Progressing °  °Problem: Coping: °Goal: Level of anxiety will decrease °Outcome: Progressing °  °Problem: Pain Managment: °Goal: General experience of comfort will improve °Outcome: Progressing °  °

## 2019-09-05 NOTE — Progress Notes (Signed)
Orthopedic Tech Progress Note Patient Details:  Luis Palmer Mar 22, 1933 [id number]  Ortho Devices Type of Ortho Device: Louretta Parma boot Ortho Device/Splint Location: Bilateral unna boots Ortho Device/Splint Interventions: Application   Post Interventions Patient Tolerated: Well Instructions Provided: Care of device   Luis Palmer 09/05/2019, 11:44 AM

## 2019-09-05 NOTE — Progress Notes (Signed)
PROGRESS NOTE    Luis Palmer  TIR:[id number] DOB: 10-Apr-1933 DOA: 09/02/2019 PCP: Idelle Crouch, MD   Subjective: The patient was seen and examined this morning stable, no acute distress, nursing staff and his wife present at bedside. Reporting of right leg pain and edema. Patient's blood pressure noted to be low, and 80s this morning, denied of having any chest pain or shortness of breath.  Cardiology Co-ox 40% yesterday, started on milrinone 0.25 with co-ox up to 69% this morning.  PICC line in place  Confirmed with the patient and his wife that he is DNR/DNI   -------------------------------------------------------------------------------------------------------------------------------- Brief Narrative:  86 year old with history of CAD status post CABG, systolic CHF EF 67% status post ICD, chronic A. fib, right-sided no carcinoma of colon status post partial resection, iron deficiency anemia, CKD stage IIIa, DM2 presented with worsening shortness of breath and peripheral edema.  Found to be in CHF exacerbation, started on diuretics and admitted to the hospital.  CHF team following, PICC line placed.  Considering starting milrinone.    -------------------------------------------------------------------------------------------------------------------------------  Assessment & Plan:   Principal Problem:   Acute on chronic systolic CHF (congestive heart failure) (HCC) Active Problems:   Adenocarcinoma of colon (HCC)   Biventricular implantable cardioverter-defibrillator in situ   Type II diabetes mellitus (HCC)   CKD (chronic kidney disease) stage 3, GFR 30-59 ml/min   Pancytopenia (HCC)   CHF (congestive heart failure) (HCC)   Protein-calorie malnutrition, severe  Acute congestive heart failure with reduced ejection fraction, 20%.  Class III -Echocardiogram here showed EF of 15-20%.  Reduced ventricular functions. -PICC line placed 4/6.   -Per cardiology: Co-ox  40% yesterday, started on milrinone 0.25 with co-ox up to 69% this morning.  -Patient was started on milrinone --- currently running -Strict input and output. -Monitor electrolytes and replete as necessary -Cardiology team following.  Hypotensive : Running in low 80s, 90s overnight -Cardiology started on midodrine   acute kidney injury on CKD stage III A -Baseline creatinine 1.4.  Admission creatinine 2.0. >>  2.09 >>> 1.82 -Monitor closely while on diuretics.  Closely monitor renal function. -We will hold Entresto  Coronary artery disease status post CABG -Currently chest pain-free.  Chronic atrial fibrillation -Continue Xarelto  History of diabetes mellitus type 2 -Sliding scale  Anemia of chronic disease.  Also has iron deficiency -Follow-up patient oncology.  Closely monitor hemoglobin.  History of dementia -On Aricept  Right lower extremity edema/wounds noted- seen by wound care team-Unna boot been placed -We will obtain a limited lower extremity Doppler study to rule out DVT  DVT prophylaxis: Xarelto Code Status: DNR  -patient and his wife confirmed DNR/DNI status Family Communication: Wife is at bedside Disposition Plan:   Patient From= Home  Patient Anticipated D/C place= Home  Barriers= maintain hospital stay for IV diuretics and inotrope arrangements.    Objective: Vitals:   09/05/19 0016 09/05/19 0312 09/05/19 0753 09/05/19 1059  BP:  (!) 97/57 (!) 69/52 (!) 77/44  Pulse:  79 76 75  Resp: 18 20 14 18   Temp:  97.6 F (36.4 C) (!) 97.4 F (36.3 C) 97.7 F (36.5 C)  TempSrc:  Oral Axillary Axillary  SpO2:  92% 99% 97%  Weight:  92.7 kg    Height:        Intake/Output Summary (Last 24 hours) at 09/05/2019 1415 Last data filed at 09/05/2019 0700 Gross per 24 hour  Intake 538.65 ml  Output 1100 ml  Net -561.35 ml   Danley Danker  Weights   09/02/19 1817 09/04/19 0314 09/05/19 0312  Weight: 93.4 kg 92.2 kg 92.7 kg    Physical Exam  Constitution:   Alert, cooperative, no distress,  Psychiatric: Normal and stable mood and affect, cognition intact,   HEENT: Normocephalic, PERRL, otherwise with in Normal limits  Chest:Chest symmetric Cardio vascular:  S1/S2, RRR, No murmure, No Rubs or Gallops  pulmonary: Clear to auscultation bilaterally, respirations unlabored, negative wheezes / crackles Abdomen: Soft, non-tender, non-distended, bowel sounds,no masses, no organomegaly Muscular skeletal: Limited exam - in bed, able to move all 4 extremities, Normal strength,  Neuro: CNII-XII intact. , normal motor and sensation, reflexes intact  Extremities:  Lower extremity +2 pitting edema lower extremities, +2 pulses  Skin: Dry, warm to touch, negative for any Rashes, No open wounds Wounds: per nursing documentation     Data Reviewed:   CBC: Recent Labs  Lab 09/02/19 1823 09/03/19 0836 09/04/19 0232 09/05/19 0340  WBC 3.8* 5.1 4.8 5.4  NEUTROABS 1.5* 2.5  --   --   HGB 12.7* 12.2* 13.1 11.5*  HCT 39.2 38.3* 40.7 34.5*  MCV 96.6 97.7 97.4 95.0  PLT 129* 124* 123* 045*   Basic Metabolic Panel: Recent Labs  Lab 09/02/19 1823 09/03/19 0836 09/04/19 0232 09/05/19 0340  NA 130* 136 136 138  K 3.7 3.8 4.3 3.3*  CL 94* 97* 97* 99  CO2 24 23 22 28   GLUCOSE 131* 107* 128* 140*  BUN 49* 50* 50* 48*  CREATININE 2.07* 2.09* 2.09* 1.82*  CALCIUM 9.5 9.4 9.7 9.1  MG  --   --  2.3 2.1   GFR: Estimated Creatinine Clearance: 32.6 mL/min (A) (by C-G formula based on SCr of 1.82 mg/dL (H)). Liver Function Tests: Recent Labs  Lab 09/02/19 1823 09/03/19 0836  AST 28 46*  ALT 20 31  ALKPHOS 96 83  BILITOT 2.0* 2.2*  PROT 6.3* 6.0*  ALBUMIN 3.4* 3.1*   No results for input(s): LIPASE, AMYLASE in the last 168 hours. No results for input(s): AMMONIA in the last 168 hours. Coagulation Profile: Recent Labs  Lab 09/02/19 1823  INR 4.3*    CBG: Recent Labs  Lab 09/04/19 1121 09/04/19 1621 09/04/19 2108 09/05/19 0621  09/05/19 1058  GLUCAP 134* 159* 158* 126* 147*    Recent Results (from the past 240 hour(s))  SARS CORONAVIRUS 2 (TAT 6-24 HRS) Nasopharyngeal Nasopharyngeal Swab     Status: None   Collection Time: 09/03/19  1:59 AM   Specimen: Nasopharyngeal Swab  Result Value Ref Range Status   SARS Coronavirus 2 NEGATIVE NEGATIVE Final    Comment: (NOTE) SARS-CoV-2 target nucleic acids are NOT DETECTED. The SARS-CoV-2 RNA is generally detectable in upper and lower respiratory specimens during the acute phase of infection. Negative results do not preclude SARS-CoV-2 infection, do not rule out co-infections with other pathogens, and should not be used as the sole basis for treatment or other patient management decisions. Negative results must be combined with clinical observations, patient history, and epidemiological information. The expected result is Negative. Fact Sheet for Patients: SugarRoll.be Fact Sheet for Healthcare Providers: https://www.woods-mathews.com/ This test is not yet approved or cleared by the [place] FDA and  has been authorized for detection and/or diagnosis of SARS-CoV-2 by FDA under an Emergency Use Authorization (EUA). This EUA will remain  in effect (meaning this test can be used) for the duration of the COVID-19 declaration under Section 56 4(b)(1) of the Act, 21 U.S.C. section 360bbb-3(b)(1), unless the authorization is  terminated or revoked sooner. Performed at [facility], [place] [address] [place], [place] [address]   MRSA PCR Screening     Status: None   Collection Time: 09/03/19  6:34 PM   Specimen: Nasal Mucosa; Nasopharyngeal  Result Value Ref Range Status   MRSA by PCR NEGATIVE NEGATIVE Final    Comment:        The GeneXpert MRSA Assay (FDA approved for NASAL specimens only), is one component of a comprehensive MRSA colonization surveillance program. It is not intended to diagnose MRSA infection  nor to guide or monitor treatment for MRSA infections. Performed at [facility], [place] [address] [place], [place] [address]          Radiology Studies: ECHOCARDIOGRAM COMPLETE  Result Date: 09/03/2019    ECHOCARDIOGRAM REPORT   Patient Name:   Luis Palmer Date of Exam: 09/03/2019 Medical Rec #:  [id number]          Height:       72.0 in Accession #:    [id number]         Weight:       206.0 lb Date of Birth:  31-Jan-1933          BSA:          2.157 m Patient Age:    60 years           BP:           105/70 mmHg Patient Gender: M                  HR:           91 bpm. Exam Location:  Inpatient Procedure: 2D Echo Indications:    CHF-Acute Systolic Y92.44  History:        Patient has prior history of Echocardiogram examinations, most                 recent 01/24/2018. CAD; Prior CABG.  Sonographer:    Mikki Santee RDCS (AE) Referring Phys: [id number] [place]  1. There is no left ventricular thrombus. Left ventricular ejection fraction, by estimation, is 15-20%. The left ventricle has severely decreased function. The left ventricle demonstrates global hypokinesis. The left ventricular internal cavity size was  moderately dilated. Left ventricular diastolic function could not be evaluated.  2. Right ventricular systolic function is severely reduced. The right ventricular size is mildly enlarged. There is moderately elevated pulmonary artery systolic pressure.  3. Left atrial size was severely dilated.  4. Right atrial size was severely dilated.  5. Large pleural effusion in the left lateral region.  6. The mitral valve is normal in structure. Moderate to severe mitral valve regurgitation.  7. Tricuspid valve regurgitation is moderate to severe.  8. The aortic valve is normal in structure. Aortic valve regurgitation is not visualized. FINDINGS  Left Ventricle: There is no left ventricular thrombus. Left ventricular ejection fraction, by estimation, is 15-20%. The left  ventricle has severely decreased function. The left ventricle demonstrates global hypokinesis. Definity contrast agent was given IV to delineate the left ventricular endocardial borders. The left ventricular internal cavity size was moderately dilated. There is no left ventricular hypertrophy. Abnormal (paradoxical) septal motion, consistent with left bundle branch block. Left ventricular diastolic function could not be evaluated due to atrial fibrillation. Left ventricular diastolic function could not be evaluated. Right Ventricle: The right ventricular size is mildly enlarged. No increase in right ventricular wall thickness. Right ventricular  systolic function is severely reduced. There is moderately elevated pulmonary artery systolic pressure. The tricuspid regurgitant velocity is 3.21 m/s, and with an assumed right atrial pressure of 15 mmHg, the estimated right ventricular systolic pressure is 50.0 mmHg. Left Atrium: Left atrial size was severely dilated. Right Atrium: Right atrial size was severely dilated. Pericardium: There is no evidence of pericardial effusion. Mitral Valve: The mitral valve is normal in structure. Moderate to severe mitral valve regurgitation, with centrally-directed jet. Tricuspid Valve: The tricuspid valve is normal in structure. Tricuspid valve regurgitation is moderate to severe. Aortic Valve: The aortic valve is normal in structure. Aortic valve regurgitation is not visualized. Pulmonic Valve: The pulmonic valve was normal in structure. Pulmonic valve regurgitation is mild. Aorta: The aortic root and ascending aorta are structurally normal, with no evidence of dilitation. IAS/Shunts: No atrial level shunt detected by color flow Doppler. Additional Comments: A pacer wire is visualized. There is a large pleural effusion in the left lateral region.  LEFT VENTRICLE PLAX 2D LVIDd:         6.60 cm LVIDs:         6.00 cm LV PW:         1.00 cm LV IVS:        1.00 cm LVOT diam:     2.30 cm  LV SV:         18 LV SV Index:   8 LVOT Area:     4.15 cm  RIGHT VENTRICLE RV S prime:     5.63 cm/s TAPSE (M-mode): 1.2 cm LEFT ATRIUM              Index       RIGHT ATRIUM           Index LA diam:        5.40 cm  2.50 cm/m  RA Area:     32.00 cm LA Vol (A2C):   115.0 ml 53.30 ml/m RA Volume:   127.00 ml 58.86 ml/m LA Vol (A4C):   122.0 ml 56.55 ml/m LA Biplane Vol: 122.0 ml 56.55 ml/m  AORTIC VALVE LVOT Vmax:   32.93 cm/s LVOT Vmean:  22.633 cm/s LVOT VTI:    0.043 m  AORTA Ao Root diam: 3.10 cm Ao Asc diam:  3.70 cm MR Peak grad: 58.1 mmHg   TRICUSPID VALVE MR Mean grad: 35.7 mmHg   TR Peak grad:   41.2 mmHg MR Vmax:      381.00 cm/s TR Vmax:        321.00 cm/s MR Vmean:     278.7 cm/s                           SHUNTS                           Systemic VTI:  0.04 m                           Systemic Diam: 2.30 cm Dani Gobble Croitoru MD Electronically signed by Sanda Klein MD Signature Date/Time: 09/03/2019/5:10:50 PM    Final    [place] EKG SITE RITE  Result Date: 09/03/2019 If Site Rite image not attached, placement could not be confirmed due to current cardiac rhythm.       Scheduled Meds: . amiodarone  400 mg Oral BID  . apixaban  2.5 mg Oral BID  .  Chlorhexidine Gluconate Cloth  6 each Topical Daily  . ciprofloxacin  250 mg Oral BID  . diclofenac Sodium  4 g Topical QID  . donepezil  5 mg Oral QHS  . ezetimibe  10 mg Oral Daily  . feeding supplement (ENSURE ENLIVE)  237 mL Oral BID BM  . furosemide  80 mg Intravenous BID  . insulin aspart  0-6 Units Subcutaneous TID WC  . latanoprost  1 drop Both Eyes QHS  . midodrine  5 mg Oral TID WC  . multivitamin with minerals  1 tablet Oral Daily  . rosuvastatin  10 mg Oral QHS  . sodium chloride flush  10-40 mL Intracatheter Q12H  . tamsulosin  0.4 mg Oral QHS   Continuous Infusions: . milrinone 0.25 mcg/kg/min (09/05/19 0502)     LOS: 3 days   Time spent= 35 mins    Deatra James, MD [facility]  If 7PM-7AM, please  contact night-coverage  09/05/2019, 2:15 PM

## 2019-09-05 NOTE — Progress Notes (Signed)
Patient ID: Luis Palmer, male   DOB: [birth year], 86 y.o.   MRN: [id number]     Advanced Heart Failure Rounding Note  PCP-Cardiologist: No primary care provider on file.   Subjective:    Co-ox 40% yesterday, started on milrinone 0.25 with co-ox up to 69% this morning.  Creatinine 2.09 => 1.82.  Increased UOP. CVP down to 9 today.  SBP lower today in 80s (was in 90s overnight).   He is awake this morning, oriented to place and time.    He is v-paced but difficult to tell atrial rhythm.    Objective:   Weight Range: 92.7 kg Body mass index is 27.72 kg/m.   Vital Signs:   Temp:  [96.1 F (35.6 C)-97.6 F (36.4 C)] 97.4 F (36.3 C) (04/08 0753) Pulse Rate:  [75-86] 79 (04/08 0312) Resp:  [18-25] 20 (04/08 0312) BP: (69-100)/(49-68) 69/52 (04/08 0753) SpO2:  [90 %-100 %] 92 % (04/08 0312) Weight:  [92.7 kg] 92.7 kg (04/08 0312) Last BM Date: 09/02/19  Weight change: Filed Weights   09/02/19 1817 09/04/19 0314 09/05/19 0312  Weight: 93.4 kg 92.2 kg 92.7 kg    Intake/Output:   Intake/Output Summary (Last 24 hours) at 09/05/2019 0800 Last data filed at 09/05/2019 0400 Gross per 24 hour  Intake 368.65 ml  Output 1100 ml  Net -731.35 ml      Physical Exam    General:  Well appearing. No resp difficulty HEENT: Normal Neck: Supple. JVP 10 cm. Carotids 2+ bilat; no bruits. No lymphadenopathy or thyromegaly appreciated. Cor: PMI nondisplaced. Regular rate & rhythm. No rubs, gallops or murmurs. Lungs: Clear Abdomen: Soft, nontender, nondistended. No hepatosplenomegaly. No bruits or masses. Good bowel sounds. Extremities: No cyanosis, clubbing, rash. 1+ edema to knees.  Neuro: Alert & orientedx3, cranial nerves grossly intact. moves all 4 extremities w/o difficulty. Affect pleasant   Telemetry   V-paced, difficult to tell atrial rhythm.  Personally reviewed.   Labs    CBC Recent Labs    09/02/19 1823 09/02/19 1823 09/03/19 0836 09/03/19 0836 09/04/19 0232  09/05/19 0340  WBC 3.8*   < > 5.1   < > 4.8 5.4  NEUTROABS 1.5*  --  2.5  --   --   --   HGB 12.7*   < > 12.2*   < > 13.1 11.5*  HCT 39.2   < > 38.3*   < > 40.7 34.5*  MCV 96.6   < > 97.7   < > 97.4 95.0  PLT 129*   < > 124*   < > 123* 112*   < > = values in this interval not displayed.   Basic Metabolic Panel Recent Labs    09/04/19 0232 09/05/19 0340  NA 136 138  K 4.3 3.3*  CL 97* 99  CO2 22 28  GLUCOSE 128* 140*  BUN 50* 48*  CREATININE 2.09* 1.82*  CALCIUM 9.7 9.1  MG 2.3 2.1   Liver Function Tests Recent Labs    09/02/19 1823 09/03/19 0836  AST 28 46*  ALT 20 31  ALKPHOS 96 83  BILITOT 2.0* 2.2*  PROT 6.3* 6.0*  ALBUMIN 3.4* 3.1*   No results for input(s): LIPASE, AMYLASE in the last 72 hours. Cardiac Enzymes No results for input(s): CKTOTAL, CKMB, CKMBINDEX, TROPONINI in the last 72 hours.  BNP: BNP (last 3 results) Recent Labs    09/02/19 1900  BNP >4,500.0*    ProBNP (last 3 results) No results for input(s): PROBNP  in the last 8760 hours.   D-Dimer No results for input(s): DDIMER in the last 72 hours. Hemoglobin A1C No results for input(s): HGBA1C in the last 72 hours. Fasting Lipid Panel No results for input(s): CHOL, HDL, LDLCALC, TRIG, CHOLHDL, LDLDIRECT in the last 72 hours. Thyroid Function Tests No results for input(s): TSH, T4TOTAL, T3FREE, THYROIDAB in the last 72 hours.  Invalid input(s): FREET3  Other results:   Imaging     No results found.   Medications:     Scheduled Medications: . amiodarone  400 mg Oral BID  . apixaban  2.5 mg Oral BID  . Chlorhexidine Gluconate Cloth  6 each Topical Daily  . ciprofloxacin  250 mg Oral BID  . donepezil  5 mg Oral QHS  . ezetimibe  10 mg Oral Daily  . feeding supplement (ENSURE ENLIVE)  237 mL Oral BID BM  . furosemide  80 mg Intravenous BID  . insulin aspart  0-6 Units Subcutaneous TID WC  . latanoprost  1 drop Both Eyes QHS  . multivitamin with minerals  1 tablet Oral  Daily  . potassium chloride  40 mEq Oral Once  . rosuvastatin  10 mg Oral QHS  . sodium chloride flush  10-40 mL Intracatheter Q12H  . tamsulosin  0.4 mg Oral QHS     Infusions: . milrinone 0.25 mcg/kg/min (09/05/19 0502)     PRN Medications:  acetaminophen **OR** acetaminophen, antiseptic oral rinse, ondansetron **OR** ondansetron (ZOFRAN) IV, sodium chloride flush   Assessment/Plan   1. Acute on chronic systolic CHF: Suspect primarily ischemic cardiomyopathy but has now developed RV failure in setting of atrial fibrillation and elevated PA pressure. Echo in 4/21 with EF 15-20%, moderate LV dilation, mildly dilated RV with severely decreased systolic function, moderate-severe TR, moderate-severe MR.  Device interrogation shows suboptimal BiV pacing (84%) in setting of persistent atrial fibrillation.  CVP 16 yesterday, down to 9 today.  Co-ox improved from 40% to 69% on milrinone 0.25 with decreased creatinine at 1.8.  SBP lower today.  - Continue milrinone 0.25 mcg/kg/min for now.  - Add midodrine 5 mg bid.  - Hold Lasix this morning with lower BP, resume this evening.    - With soft BP and elevated creatinine, will hold off on other HF meds for now.   - With age and dementia, not candidate for advanced therapies.  - I would like to try to get him back into NSR, hopefully he could get some improvement from this with restoration of atrial "kick" as well as increase in percentage of BiV pacing.  Probably still in atrial fibrillation on ECG today but may need to interrogate device again to be sure.  2. Atrial fibrillation: Persistent.  He has had 2 atrial fibrillation ablations in the past, 5/10 and 2/12.  He was on sotalol in the past as well.  He appears to have gone into atrial fibrillation (by device interrogation) around 5/20.  No attempt was made to get him back into NSR after that.  His BiV pacing percentage has dropped.  I would like to try to get him in NSR.  - Continue amiodarone  400 mg bid.  - Continue Eliquis 2.5 mg bid in preparation for TEE-guided DCCV (Xarelto 10 mg daily was inadequate dose).   - Will plan TEE-guided DCCV when better diuresed.  3. AKI on CKD stage 3: Creatinine baseline appears to be around 1.4, increased to 2.  Creatinine down to 1.82 with milrinone. 4. CAD: S/p CABG.  No chest pain.  No recent coronary angiography.  No evidence for ACS this admission.  - Continue Crestor and Zetia.  - On anticoagulation, no ASA.  5. Venous stasis ulcers: Wound care following, unna boots.  6. Valvular heart disease: Moderate-severe MR and TR, suspect functional due to annular dilation.  7. Dementia: on Aricept.   Length of Stay: 3  [name], MD  09/05/2019, 8:00 AM  Advanced Heart Failure Team Pager [phone] (M-F; 7a - 4p)  Please contact [facility] for night-coverage after hours (4p -7a ) and weekends on [email or web address]

## 2019-09-05 NOTE — Progress Notes (Signed)
Left message for ortho tech reference unna boots that were to be placed yesterday. No answer. RN left voicemail.  Awaiting response.

## 2019-09-05 NOTE — Progress Notes (Signed)
Bilateral lower extremity venous duplex completed. Refer to "CV Proc" under chart review to view preliminary results.  09/05/2019 2:59 PM Kelby Aline., MHA, RVT, RDCS, RDMS

## 2019-09-06 ENCOUNTER — Inpatient Hospital Stay (HOSPITAL_COMMUNITY): Payer: Medicare Other

## 2019-09-06 ENCOUNTER — Inpatient Hospital Stay (HOSPITAL_COMMUNITY): Payer: Medicare Other | Admitting: Certified Registered Nurse Anesthetist

## 2019-09-06 ENCOUNTER — Encounter (HOSPITAL_COMMUNITY)
Admission: EM | Disposition: A | Discharge status: Home / Self Care | Payer: Self-pay | Source: Home / Self Care | Attending: Internal Medicine

## 2019-09-06 ENCOUNTER — Encounter (HOSPITAL_COMMUNITY): Payer: Self-pay | Admitting: Internal Medicine

## 2019-09-06 DIAGNOSIS — R531 Weakness: Secondary | ICD-10-CM

## 2019-09-06 DIAGNOSIS — I5023 Acute on chronic systolic (congestive) heart failure: Secondary | ICD-10-CM | POA: Diagnosis not present

## 2019-09-06 DIAGNOSIS — Z515 Encounter for palliative care: Secondary | ICD-10-CM | POA: Diagnosis not present

## 2019-09-06 DIAGNOSIS — Z7189 Other specified counseling: Secondary | ICD-10-CM

## 2019-09-06 DIAGNOSIS — E46 Unspecified protein-calorie malnutrition: Secondary | ICD-10-CM

## 2019-09-06 DIAGNOSIS — Z66 Do not resuscitate: Secondary | ICD-10-CM

## 2019-09-06 DIAGNOSIS — I4891 Unspecified atrial fibrillation: Secondary | ICD-10-CM

## 2019-09-06 DIAGNOSIS — I340 Nonrheumatic mitral (valve) insufficiency: Secondary | ICD-10-CM

## 2019-09-06 DIAGNOSIS — G8929 Other chronic pain: Secondary | ICD-10-CM

## 2019-09-06 DIAGNOSIS — M25561 Pain in right knee: Secondary | ICD-10-CM

## 2019-09-06 HISTORY — PX: TEE WITHOUT CARDIOVERSION: SHX5443

## 2019-09-06 HISTORY — PX: CARDIOVERSION: SHX1299

## 2019-09-06 LAB — BASIC METABOLIC PANEL
Anion gap: 11 (ref 5–15)
BUN: 44 mg/dL — ABNORMAL HIGH (ref 8–23)
CO2: 28 mmol/L (ref 22–32)
Calcium: 8.9 mg/dL (ref 8.9–10.3)
Chloride: 98 mmol/L (ref 98–111)
Creatinine, Ser: 1.71 mg/dL — ABNORMAL HIGH (ref 0.61–1.24)
GFR calc Af Amer: 41 mL/min — ABNORMAL LOW (ref >60)
GFR calc non Af Amer: 36 mL/min — ABNORMAL LOW (ref >60)
Glucose, Bld: 119 mg/dL — ABNORMAL HIGH (ref 70–99)
Potassium: 3.9 mmol/L (ref 3.5–5.1)
Sodium: 137 mmol/L (ref 135–145)

## 2019-09-06 LAB — CBC
HCT: 33.2 % — ABNORMAL LOW (ref 39.0–52.0)
Hemoglobin: 11 g/dL — ABNORMAL LOW (ref 13.0–17.0)
MCH: 32 pg (ref 26.0–34.0)
MCHC: 33.1 g/dL (ref 30.0–36.0)
MCV: 96.5 fL (ref 80.0–100.0)
Platelets: 106 10*3/uL — ABNORMAL LOW (ref 150–400)
RBC: 3.44 MIL/uL — ABNORMAL LOW (ref 4.22–5.81)
RDW: 18.5 % — ABNORMAL HIGH (ref 11.5–15.5)
WBC: 5.4 10*3/uL (ref 4.0–10.5)
nRBC: 0 % (ref 0.0–0.2)

## 2019-09-06 LAB — PROTIME-INR
INR: 3.1 — ABNORMAL HIGH (ref 0.8–1.2)
Prothrombin Time: 31.8 seconds — ABNORMAL HIGH (ref 11.4–15.2)

## 2019-09-06 LAB — COOXEMETRY PANEL
Carboxyhemoglobin: 1.8 % — ABNORMAL HIGH (ref 0.5–1.5)
Methemoglobin: 0.5 % (ref 0.0–1.5)
O2 Saturation: 68.2 %
Total hemoglobin: 10.9 g/dL — ABNORMAL LOW (ref 12.0–16.0)

## 2019-09-06 LAB — MAGNESIUM: Magnesium: 1.9 mg/dL (ref 1.7–2.4)

## 2019-09-06 LAB — GLUCOSE, CAPILLARY
Glucose-Capillary: 120 mg/dL — ABNORMAL HIGH (ref 70–99)
Glucose-Capillary: 112 mg/dL — ABNORMAL HIGH (ref 70–99)
Glucose-Capillary: 142 mg/dL — ABNORMAL HIGH (ref 70–99)
Glucose-Capillary: 197 mg/dL — ABNORMAL HIGH (ref 70–99)

## 2019-09-06 LAB — URIC ACID: Uric Acid, Serum: 9.7 mg/dL — ABNORMAL HIGH (ref 3.7–8.6)

## 2019-09-06 SURGERY — ECHOCARDIOGRAM, TRANSESOPHAGEAL
Anesthesia: General

## 2019-09-06 MED ORDER — MIDODRINE HCL 5 MG PO TABS
10.0000 mg | ORAL_TABLET | Freq: Three times a day (TID) | ORAL | Status: DC
  Administered 2019-09-06 – 2019-09-08 (×7): 10 mg via ORAL
  Filled 2019-09-06 (×7): qty 2

## 2019-09-06 MED ORDER — POTASSIUM CHLORIDE CRYS ER 20 MEQ PO TBCR
20.0000 meq | EXTENDED_RELEASE_TABLET | Freq: Once | ORAL | Status: AC
  Administered 2019-09-06: 20 meq via ORAL
  Filled 2019-09-06: qty 1

## 2019-09-06 MED ORDER — KETAMINE HCL 10 MG/ML IJ SOLN
INTRAMUSCULAR | Status: DC | PRN
  Administered 2019-09-06 (×2): 10 mg via INTRAVENOUS

## 2019-09-06 MED ORDER — ACETAMINOPHEN 10 MG/ML IV SOLN
1000.0000 mg | Freq: Three times a day (TID) | INTRAVENOUS | Status: AC
  Filled 2019-09-06: qty 100

## 2019-09-06 MED ORDER — PHENYLEPHRINE HCL-NACL 10-0.9 MG/250ML-% IV SOLN
INTRAVENOUS | Status: DC | PRN
  Administered 2019-09-06: 25 ug/min via INTRAVENOUS

## 2019-09-06 MED ORDER — SODIUM CHLORIDE 0.9 % IV SOLN: INTRAVENOUS | Status: DC | PRN

## 2019-09-06 MED ORDER — PHENYLEPHRINE HCL-NACL 10-0.9 MG/250ML-% IV SOLN: 25.0000 ug/min | INTRAVENOUS | Status: DC

## 2019-09-06 MED ORDER — MIDODRINE HCL 5 MG PO TABS
5.0000 mg | ORAL_TABLET | Freq: Once | ORAL | Status: AC
  Administered 2019-09-06: 5 mg via ORAL
  Filled 2019-09-06: qty 1

## 2019-09-06 MED ORDER — KETAMINE HCL 50 MG/5ML IJ SOSY
PREFILLED_SYRINGE | INTRAMUSCULAR | Status: AC
  Filled 2019-09-06: qty 5

## 2019-09-06 MED ORDER — SODIUM CHLORIDE 0.9 % IV SOLN: INTRAVENOUS | Status: DC

## 2019-09-06 MED ORDER — SODIUM CHLORIDE 0.9 % IV SOLN: 250.0000 mL | INTRAVENOUS | Status: DC

## 2019-09-06 MED ORDER — MAGNESIUM SULFATE 2 GM/50ML IV SOLN
2.0000 g | Freq: Once | INTRAVENOUS | Status: AC
  Administered 2019-09-06: 2 g via INTRAVENOUS
  Filled 2019-09-06: qty 50

## 2019-09-06 MED ORDER — PROPOFOL 500 MG/50ML IV EMUL
INTRAVENOUS | Status: DC | PRN
  Administered 2019-09-06: 25 ug/kg/min via INTRAVENOUS

## 2019-09-06 NOTE — Progress Notes (Signed)
  Echocardiogram 2D Echocardiogram has been performed.  Luis Palmer 09/06/2019, 1:16 PM

## 2019-09-06 NOTE — Transfer of Care (Signed)
Immediate Anesthesia Transfer of Care Note  Patient: Luis Palmer  Procedure(s) Performed: TRANSESOPHAGEAL ECHOCARDIOGRAM (TEE) (N/A ) CARDIOVERSION (N/A )  Patient Location: PACU  Anesthesia Type:General  Level of Consciousness: awake, alert  and oriented  Airway & Oxygen Therapy: Patient Spontanous Breathing and Patient connected to nasal cannula oxygen  Post-op Assessment: Report given to RN, Post -op Vital signs reviewed and stable and Patient moving all extremities X 4  Post vital signs: Reviewed and stable  Last Vitals:  Vitals Value Taken Time  BP 76/41 09/06/19 1338  Temp    Pulse 72 09/06/19 1341  Resp 24 09/06/19 1341  SpO2 93 % 09/06/19 1341  Vitals shown include unvalidated device data.  Last Pain:  Vitals:   09/06/19 1137  TempSrc: Temporal  PainSc: 0-No pain         Complications: No apparent anesthesia complications

## 2019-09-06 NOTE — Plan of Care (Signed)
  Problem: Education: Goal: Knowledge of General Education information will improve Description: Including pain rating scale, medication(s)/side effects and non-pharmacologic comfort measures Outcome: Progressing   Problem: Clinical Measurements: Goal: Cardiovascular complication will be avoided Outcome: Progressing   Problem: Nutrition: Goal: Adequate nutrition will be maintained Outcome: Progressing   Problem: Coping: Goal: Level of anxiety will decrease Outcome: Progressing   Problem: Pain Managment: Goal: General experience of comfort will improve Outcome: Progressing   

## 2019-09-06 NOTE — CV Procedure (Signed)
Procedure: TEE  Indication: Atrial fibrillation  Sedation: Per anesthesiology  Findings: Please see echo section for full report.  Mildly dilated LV with normal wall thickness.  No LV hypertrophy.  EF 20%, diffuse hypokinesis.  Mildly dilated RV with moderately decreased systolic function.  Pacemaker wires noted in RV.  Mild TR. Mild-moderate MR.  Trileaflet aortic valve with no stenosis or regurgitation.  Mild left atrial enlargement with no LA appendage thrombus.  Mild right atrial enlargement.  No PFO/ASD by color doppler.  Normal caliber thoracic aorta with grade 3 plaque descending thoracic aorta. There is a left pleural effusion noted.   May proceed to DCCV.   Luis Palmer 09/06/2019 1:11 PM

## 2019-09-06 NOTE — Consult Note (Addendum)
Consultation Note Date: 09/06/2019   Patient Name: Luis Palmer  DOB: 13-Aug-1932  MRN: [id number]  Age / Sex: 86 y.o., male  PCP: Idelle Crouch, MD Referring Physician: Deatra James, MD  Reason for Consultation: Establishing goals of care  HPI/Patient Profile:  Per hospitalist note --> 5 year old with history of CAD status post CABG, systolic CHF EF 16% status post ICD, chronic A. fib, right-sided no carcinoma of colon status post partial resection, iron deficiency anemia, CKD stage IIIa, DM2 presented with worsening shortness of breath and peripheral edema.  Found to be in CHF exacerbation, started on diuretics and admitted to the hospital.  CHF team following, PICC line placed.   Palliative care was asked to address goals of care in the setting of end stage biventricular heart failure.  Clinical Assessment and Goals of Care: I have reviewed medical records including EPIC notes, labs and imaging, received report from bedside RN, assessed the patient.    I met with Luis Palmer (patient), Luis Palmer (wife), and Luis Palmer (daughter) to further discuss diagnosis prognosis, Cobb, EOL wishes, disposition and options.   I introduced Palliative Medicine as specialized medical care for people living with serious illness. It focuses on providing relief from the symptoms and stress of a serious illness. The goal is to improve quality of life for both the patient and the family.  I asked Luis Palmer to share a little about his life with me. He told me that he is from [facility]. He has been married to his wife, Luis Palmer for 26 years. He and his wife together have six children, four sons and two daughters. Luis Palmer worked for [employer] for thirty five years and traveled often between [place], and [place]. 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   I asked Luis Palmer how his health had been going at home. He shared that he has had some difficulties. He vocalized becoming more dependant on his wife, Luis Palmer to help him. He considers himself at this juncture almost fully dependant on her for assistance with bADLs.   In regards to Luis Palmer's biventricular heart failure he certainly has experienced symptoms from this. He shares that his mouth gets dry on diuresis so he drinks fluids then gets overloaded with them. He describes a repetitious cycle. He is not on oxygen at home and maintaining on 2LPM here. Maclean shares the most frustrating component of illness is his inability to care for himself. We discussed how overtime heart failure can get worse despite our best management efforts. He stated that he has dealt with these issues for the last twenty or so years and that this is not new. I stated understanding that this is not new, but it is a chronic diease that does not get better overtime.   I introduced the topic of hospice in the setting of Luis Palmer current condition. I shared that  it is a service utilized to optimize the time we have left through addressing symptoms. We reviewed the level of care they would be able to provide in the home which consists often of a nurse visit 1-2x weekly and a care aide visit 2-3x weekly. I shared with Luis Palmer that home hospice does not provide 24/7 care, but that is available though the cost is out of pocket. She understood this. Luis Palmer and Luis Palmer are familiar with hospice as their daughter is presently in a hospice home in [place]. She endured a tragic MVA years ago.   DNR confirmation obtained.   Present symptoms of Luis Palmer consist of pain in his R LE, it causes great debility.   Discussed the importance of continued conversation with family and their  medical providers regarding overall plan of care and treatment options, ensuring decisions are within the context of the  patients values and GOCs.  Decision Maker: Patient can make decisions for himself, in the event that he would be unable to his wife, Luis Palmer [phone]  SUMMARY OF RECOMMENDATIONS   DNR/DNI  TOC --> Informational meeting on home hospice  Ongoing [place] conversations  Chaplain support  Code Status/Advance Care Planning:  DNR  Symptom Management:  Right LE Pain:  - Tylenol 1g IV Q8H x24 hours  - Agree with checking uric acid level, if needed renally dose colchicine would be appropriate with observation for improvement  Generalized Weakness:  - PT/OT involved  Malnutrition:  - Dietary involved  Acute  Congestive heart failure with reduced ejection fraction, 20%.  Class III Hypotensive : acute kidney injury on CKD stage III A Coronary artery disease status post CABG Chronic atrial fibrillation History of diabetes mellitus type 2 Anemia of chronic disease.  Also has iron deficiency History of dementia Right lower extremity edema/wounds noted  Palliative Prophylaxis:   Frequent Pain Assessment  Additional Recommendations (Limitations, Scope, Preferences):  Treat the treatable  Psycho-social/Spiritual:   Desire for further Chaplaincy support: Yes  Additional Recommendations: Caregiving  Support/Resources and Education on Hospice  Prognosis:   < 6 months  Discharge Planning: To Be Determined     Primary Diagnoses: Present on Admission: . Biventricular implantable cardioverter-defibrillator in situ . Adenocarcinoma of colon ([place]) . Acute on chronic systolic CHF (congestive heart failure) ([place]) . CKD (chronic kidney disease) stage 3, GFR 30-59 ml/min . Pancytopenia ([place])  I have reviewed the medical record, interviewed the patient and family, and examined the patient. The following aspects are pertinent.  Past Medical History:  Diagnosis Date  . AICD (automatic cardioverter/defibrillator) present 2006  . Anemia   . Atrial fibrillation ([place])   . CHF  (congestive heart failure) ([place])   . CKD (chronic kidney disease), stage III   . Colon cancer ([place])    "dx'd 01/24/2018"  . High cholesterol   . History of blood transfusion 01/22/2018   "I lost blood; HgB extremely low"  . History of gout   . Myocardial infarction ([place]) 1986  . OSA on CPAP   . Pneumonia 2015  . Type II diabetes mellitus ([place])    Social History   Socioeconomic History  . Marital status: Married    Spouse name: Luis Palmer  . Number of children: 4  . Years of education: Not on file  . Highest education level: Not on file  Occupational History  . Not on file  Tobacco Use  . Smoking status: Former Smoker    Packs/day: 1.00    Years: 40.00  Pack years: 40.00    Types: Cigarettes    Quit date: 1986    Years since quitting: 35.2  . Smokeless tobacco: Never Used  Substance and Sexual Activity  . Alcohol use: Not Currently  . Drug use: Never  . Sexual activity: Not on file  Other Topics Concern  . Not on file  Social History Narrative  . Not on file   Social Determinants of Health   Financial Resource Strain:   . Difficulty of Paying Living Expenses:   Food Insecurity:   . Worried About Charity fundraiser in the Last Year:   . Arboriculturist in the Last Year:   Transportation Needs:   . Film/video editor (Medical):   Marland Kitchen Lack of Transportation (Non-Medical):   Physical Activity:   . Days of Exercise per Week:   . Minutes of Exercise per Session:   Stress:   . Feeling of Stress :   Social Connections:   . Frequency of Communication with Friends and Family:   . Frequency of Social Gatherings with Friends and Family:   . Attends Religious Services:   . Active Member of Clubs or Organizations:   . Attends Archivist Meetings:   Marland Kitchen Marital Status:    Family History  Problem Relation Age of Onset  . Congestive Heart Failure Mother   . Diabetes Father   . Stroke Father   . Lung cancer Brother 36  . Skin cancer Brother        squamous  cell  . Heart attack Maternal Uncle    Scheduled Meds: . amiodarone  400 mg Oral BID  . apixaban  2.5 mg Oral BID  . Chlorhexidine Gluconate Cloth  6 each Topical Daily  . ciprofloxacin  250 mg Oral BID  . diclofenac Sodium  4 g Topical QID  . donepezil  5 mg Oral QHS  . ezetimibe  10 mg Oral Daily  . feeding supplement (ENSURE ENLIVE)  237 mL Oral BID BM  . furosemide  80 mg Intravenous BID  . insulin aspart  0-6 Units Subcutaneous TID WC  . latanoprost  1 drop Both Eyes QHS  . midodrine  5 mg Oral TID WC  . multivitamin with minerals  1 tablet Oral Daily  . rosuvastatin  10 mg Oral QHS  . sodium chloride flush  10-40 mL Intracatheter Q12H  . tamsulosin  0.4 mg Oral QHS   Continuous Infusions: . sodium chloride    . milrinone 0.25 mcg/kg/min (09/05/19 1840)   PRN Meds:.acetaminophen **OR** acetaminophen, antiseptic oral rinse, ondansetron **OR** ondansetron (ZOFRAN) IV, sodium chloride flush Medications Prior to Admission:  Prior to Admission medications   Medication Sig Start Date End Date Taking? Authorizing Provider  acetaminophen (TYLENOL) 500 MG tablet Take 2 tablets (1,000 mg total) by mouth every 8 (eight) hours. Patient taking differently: Take 1,000 mg by mouth daily as needed for headache (pain).  02/12/18  Yes Cristal Deer, MD  canagliflozin (INVOKANA) 100 MG TABS tablet Take by mouth. 04/24/19 04/23/20 Yes [provider]  ciprofloxacin (CIPRO) 250 MG tablet Take 1 tablet (250 mg total) by mouth daily. 08/27/19  Yes Ralene Bathe, MD  donepezil (ARICEPT) 5 MG tablet Take 5 mg by mouth at bedtime.  06/11/19  Yes [provider]  ezetimibe (ZETIA) 10 MG tablet Take 10 mg by mouth daily.    Yes [provider]  guaiFENesin (MUCINEX PO) Take 1 tablet by mouth 2 (two) times daily.  Yes [provider]  hydrocortisone 2.5 % cream Apply 1 application topically daily as needed (rash/irritation).  06/06/19  Yes [provider]    latanoprost (XALATAN) 0.005 % ophthalmic solution Place 1 drop into both eyes at bedtime.  12/14/18  Yes [provider]  Multiple Vitamin (MULTIVITAMIN WITH MINERALS) TABS tablet Take 1 tablet by mouth daily. 02/13/18  Yes Cristal Deer, MD  Multiple Vitamins-Minerals (ZINC PO) Take 1 tablet by mouth daily.   Yes [provider]  rivaroxaban (XARELTO) 10 MG TABS tablet Take 10 mg by mouth daily.   Yes [provider]  rosuvastatin (CRESTOR) 10 MG tablet Take 10 mg by mouth at bedtime.  12/30/17  Yes [provider]  sacubitril-valsartan (ENTRESTO) 24-26 MG Take 1 tablet by mouth 2 (two) times daily.   Yes [provider]  sodium chloride (OCEAN) 0.65 % nasal spray Place 1 spray into the nose as needed for congestion.  06/12/19  Yes [provider]  tamsulosin (FLOMAX) 0.4 MG CAPS capsule Take 1 capsule (0.4 mg total) by mouth at bedtime. 02/12/18  Yes Cristal Deer, MD  torsemide (DEMADEX) 20 MG tablet Take 30 mg by mouth daily.  06/10/19  Yes [provider]  VITAMIN D PO Take 1 capsule by mouth daily.    Yes [provider]   Allergies  Allergen Reactions  . Pseudoephedrine Hcl Other (See Comments)    Other Reaction: ANURIA  . Ace Inhibitors Other (See Comments) and Cough  . Nsaids Other (See Comments)    Reaction:  Fluid retention   . Vasotec [Enalapril] Cough   Review of Systems  Constitutional: Positive for fatigue and unexpected weight change.  Musculoskeletal: Positive for arthralgias and gait problem.  Neurological: Positive for weakness.   Physical Exam Vitals and nursing note reviewed.  HENT:     Head: Normocephalic.     Nose: Nose normal.     Mouth/Throat:     Mouth: Mucous membranes are dry.  Eyes:     Pupils: Pupils are equal, round, and reactive to light.  Cardiovascular:     Rate and Rhythm: Rhythm irregular.     Pulses: Normal pulses.  Pulmonary:     Effort: Pulmonary effort is normal.      Comments: On 2LPM [place] Abdominal:     Palpations: Abdomen is soft.  Musculoskeletal:     Cervical back: Normal range of motion.     Comments: R knee stiff/creptitus  Skin:    Capillary Refill: Capillary refill takes less than 2 seconds.     Findings: Bruising present.  Neurological:     Mental Status: He is alert and oriented to person, place, and time.  Psychiatric:        Mood and Affect: Mood normal.   Vital Signs: BP (!) 91/48 (BP Location: Left Arm)   Pulse 84   Temp 97.6 F (36.4 C) (Oral)   Resp 19   Ht 6' (1.829 m)   Wt 92.4 kg   SpO2 96%   BMI 27.63 kg/m  Pain Scale: 0-10   Pain Score: 0-No pain  SpO2: SpO2: 96 % O2 Device:SpO2: 96 % O2 Flow Rate: .O2 Flow Rate (L/min): 2 L/min  IO: Intake/output summary:   Intake/Output Summary (Last 24 hours) at 09/06/2019 0703 Last data filed at 09/06/2019 0241 Gross per 24 hour  Intake 368.24 ml  Output 1700 ml  Net -1331.76 ml   LBM: Last BM Date: 09/02/19 Baseline Weight: Weight: 93.4 kg Most recent  weight: Weight: 92.4 kg     Palliative Assessment/Data:40%  Time In: 0645 Time Out: 0800 Time Total: 75 Greater than 50%  of this time was spent counseling and coordinating care related to the above assessment and plan.  Signed by: Rosezella Rumpf, NP   Please contact Palliative Medicine Team phone at [phone] for questions and concerns.  For individual provider: See Shea Evans

## 2019-09-06 NOTE — Discharge Instructions (Signed)

## 2019-09-06 NOTE — Progress Notes (Addendum)
Nutrition Follow-up  DOCUMENTATION CODES:   Severe malnutrition in context of chronic illness  INTERVENTION:   -Once diet is resumed, continue:  -Ensure Enlive po BID, each supplement provides 350 kcal and 20 grams of protein -MVI with minerals daily -Magic cup TID with meals, each supplement provides 290 kcal and 9 grams of protein -Provided "[facility] Stay" handouts in AVS/ discharge sumary  NUTRITION DIAGNOSIS:   Severe Malnutrition related to chronic illness(CHF) as evidenced by severe fat depletion, severe muscle depletion, energy intake < or equal to 75% for > or equal to 1 month.  Ongoing  GOAL:   Patient will meet greater than or equal to 90% of their needs  Progressing   MONITOR:   PO intake, Supplement acceptance, Weight trends, Labs, I & O's  REASON FOR ASSESSMENT:   Consult Assessment of nutrition requirement/status  ASSESSMENT:   Patient with PMH significant for CHF s/p AICD, CKD IV, DM, and colon cancer s/p resection (2019). Presents this admission with acute exacerbation of CHF.  Reviewed I/O's: -1.3 L x 24 hours and -2.8 L since admission  UOP: 1.7 L x 24 hours  Pt NPO for TEE today. Pt was down in endo suite at time of visit.   Noted pt with fair oral intake; meal completion 40-85%. Pt was consuming Ensure Enlive supplements prior to NPO status. RD who completed initial assessment discussed importance of nutritional supplements with pt and family and encouraged home use.   Palliative care has been consulted; pt and family considering discharging with home hospice.   Medications reviewed and include 0.9% sodium chloride infusion @ 20 ml/hr.   Labs reviewed: CBGS: 120-202 (inpatient orders for glycemic control are 0-6 units insulin aspart TID with meals).   Diet Order:   Diet Order            Diet NPO time specified  Diet effective midnight        Diet NPO time specified  Diet effective midnight              EDUCATION  NEEDS:   Education needs have been addressed  Skin:  Skin Assessment: Reviewed RN Assessment  Last BM:  09/06/19  Height:   Ht Readings from Last 1 Encounters:  09/06/19 6' (1.829 m)    Weight:   Wt Readings from Last 1 Encounters:  09/06/19 93.9 kg    Ideal Body Weight:  80.9 kg  BMI:  Body mass index is 28.07 kg/m.  Estimated Nutritional Needs:   Kcal:  2200-2400 kcal  Protein:  110-125 grams  Fluid:  1.2 fluid restriction    Loistine Chance, RD, LDN, [place] Registered Dietitian II Certified Diabetes Care and Education Specialist Please refer to [facility] for RD and/or RD on-call/weekend/after hours pager

## 2019-09-06 NOTE — Anesthesia Procedure Notes (Signed)
Procedure Name: MAC Date/Time: 09/06/2019 12:45 PM Performed by: Harden Mo, CRNA Pre-anesthesia Checklist: Patient identified, Emergency Drugs available, Suction available and Patient being monitored Patient Re-evaluated:Patient Re-evaluated prior to induction Oxygen Delivery Method: Nasal cannula Preoxygenation: Pre-oxygenation with 100% oxygen Induction Type: IV induction Placement Confirmation: positive ETCO2 and breath sounds checked- equal and bilateral Dental Injury: Teeth and Oropharynx as per pre-operative assessment

## 2019-09-06 NOTE — Progress Notes (Signed)
Patient refuse NIV for the night. Stating that he just wants to rest. RN aware of refusal

## 2019-09-06 NOTE — H&P (View-Only) (Signed)
Patient ID: Luis Palmer, male   DOB: 03-12-1933, 86 y.o.   MRN: [id number]     Advanced Heart Failure Rounding Note  PCP-Cardiologist: No primary care provider on file.   Subjective:    Remains on milrinone 0.25 mcg. CO-OX 68%.   Yesterday diuresed with IV lasix. Negative >1 liter. Weight down 1 pounds.   Complaining of right knee pain. Denies SOB   Objective:   Weight Range: 92.4 kg Body mass index is 27.63 kg/m.   Vital Signs:   Temp:  [97.2 F (36.2 C)-97.7 F (36.5 C)] 97.6 F (36.4 C) (04/09 0300) Pulse Rate:  [74-84] 84 (04/09 0300) Resp:  [13-22] 19 (04/09 0300) BP: (69-101)/(44-54) 91/48 (04/09 0300) SpO2:  [96 %-99 %] 96 % (04/09 0300) Weight:  [92.4 kg] 92.4 kg (04/09 0241) Last BM Date: 09/02/19  Weight change: Filed Weights   09/04/19 0314 09/05/19 0312 09/06/19 0241  Weight: 92.2 kg 92.7 kg 92.4 kg    Intake/Output:   Intake/Output Summary (Last 24 hours) at 09/06/2019 0750 Last data filed at 09/06/2019 0241 Gross per 24 hour  Intake 368.24 ml  Output 1700 ml  Net -1331.76 ml      Physical Exam   CVP 3 personally checked.  General:   No resp difficulty HEENT: normal Neck: supple. no JVD. Carotids 2+ bilat; no bruits. No lymphadenopathy or thryomegaly appreciated. Cor: PMI nondisplaced. Regular rate & rhythm. No rubs, gallops or murmurs. Lungs: clear Abdomen: soft, nontender, nondistended. No hepatosplenomegaly. No bruits or masses. Good bowel sounds. Extremities: no cyanosis, clubbing, rash, R and LLE unna boots. Still with upper thigh edema.  Neuro: alert & orientedx3, cranial nerves grossly intact. moves all 4 extremities w/o difficulty. Affect pleasant   Telemetry   V-paced, difficult to tell atrial rhythm.  Personally reviewed.   Labs    CBC Recent Labs    09/03/19 0836 09/04/19 0232 09/05/19 0340 09/06/19 0430  WBC 5.1   < > 5.4 5.4  NEUTROABS 2.5  --   --   --   HGB 12.2*   < > 11.5* 11.0*  HCT 38.3*   < > 34.5* 33.2*   MCV 97.7   < > 95.0 96.5  PLT 124*   < > 112* 106*   < > = values in this interval not displayed.   Basic Metabolic Panel Recent Labs    09/05/19 0340 09/06/19 0430  NA 138 137  K 3.3* 3.9  CL 99 98  CO2 28 28  GLUCOSE 140* 119*  BUN 48* 44*  CREATININE 1.82* 1.71*  CALCIUM 9.1 8.9  MG 2.1 1.9   Liver Function Tests Recent Labs    09/03/19 0836  AST 46*  ALT 31  ALKPHOS 83  BILITOT 2.2*  PROT 6.0*  ALBUMIN 3.1*   No results for input(s): LIPASE, AMYLASE in the last 72 hours. Cardiac Enzymes No results for input(s): CKTOTAL, CKMB, CKMBINDEX, TROPONINI in the last 72 hours.  BNP: BNP (last 3 results) Recent Labs    09/02/19 1900  BNP >4,500.0*    ProBNP (last 3 results) No results for input(s): PROBNP in the last 8760 hours.   D-Dimer No results for input(s): DDIMER in the last 72 hours. Hemoglobin A1C No results for input(s): HGBA1C in the last 72 hours. Fasting Lipid Panel No results for input(s): CHOL, HDL, LDLCALC, TRIG, CHOLHDL, LDLDIRECT in the last 72 hours. Thyroid Function Tests No results for input(s): TSH, T4TOTAL, T3FREE, THYROIDAB in the last 72 hours.  Invalid input(s): FREET3  Other results:   Imaging    VAS [place] LOWER EXTREMITY VENOUS (DVT)  Result Date: 09/05/2019  Lower Venous DVTStudy Indications: Pain, and Swelling.  Limitations: Bilateral Unna boots. Comparison Study: No prior study Performing Technologist: Maudry Mayhew MHA, RDMS, RVT, RDCS  Examination Guidelines: A complete evaluation includes B-mode imaging, spectral Doppler, color Doppler, and power Doppler as needed of all accessible portions of each vessel. Bilateral testing is considered an integral part of a complete examination. Limited examinations for reoccurring indications may be performed as noted. The reflux portion of the exam is performed with the patient in reverse Trendelenburg.  +---------+---------------+---------+-----------+----------+--------------+ RIGHT     CompressibilityPhasicitySpontaneityPropertiesThrombus Aging +---------+---------------+---------+-----------+----------+--------------+ CFV      Full           No       Yes                                 +---------+---------------+---------+-----------+----------+--------------+ SFJ      Full                                                        +---------+---------------+---------+-----------+----------+--------------+ FV Prox  Full                                                        +---------+---------------+---------+-----------+----------+--------------+ FV Mid   Full                                                        +---------+---------------+---------+-----------+----------+--------------+ FV DistalFull                                                        +---------+---------------+---------+-----------+----------+--------------+ PFV      Full                                                        +---------+---------------+---------+-----------+----------+--------------+ POP      Full           No       Yes                                 +---------+---------------+---------+-----------+----------+--------------+   Right Technical Findings: Not visualized segments include PTV, peroneal veins.  +---------+---------------+---------+-----------+----------+--------------+ LEFT     CompressibilityPhasicitySpontaneityPropertiesThrombus Aging +---------+---------------+---------+-----------+----------+--------------+ CFV      Full           No       Yes                                 +---------+---------------+---------+-----------+----------+--------------+  SFJ      Full                                                        +---------+---------------+---------+-----------+----------+--------------+ FV Prox  Full                                                         +---------+---------------+---------+-----------+----------+--------------+ FV Mid   Full                                                        +---------+---------------+---------+-----------+----------+--------------+ FV DistalFull                                                        +---------+---------------+---------+-----------+----------+--------------+ PFV      Full                                                        +---------+---------------+---------+-----------+----------+--------------+ POP      Full           No       Yes                                 +---------+---------------+---------+-----------+----------+--------------+   Left Technical Findings: Not visualized segments include PTV, peroneal veins.   Summary: RIGHT: - There is no evidence of deep vein thrombosis in the lower extremity. However, portions of this examination were limited- see technologist comments above.  - No cystic structure found in the popliteal fossa.  LEFT: - There is no evidence of deep vein thrombosis in the lower extremity. However, portions of this examination were limited- see technologist comments above.  - No cystic structure found in the popliteal fossa.  *See table(s) above for measurements and observations. Electronically signed by Luis Jews MD on 09/05/2019 at 3:12:20 PM.    Final      Medications:     Scheduled Medications: . amiodarone  400 mg Oral BID  . apixaban  2.5 mg Oral BID  . Chlorhexidine Gluconate Cloth  6 each Topical Daily  . ciprofloxacin  250 mg Oral BID  . diclofenac Sodium  4 g Topical QID  . donepezil  5 mg Oral QHS  . ezetimibe  10 mg Oral Daily  . feeding supplement (ENSURE ENLIVE)  237 mL Oral BID BM  . furosemide  80 mg Intravenous BID  . insulin aspart  0-6 Units Subcutaneous TID WC  . latanoprost  1 drop Both Eyes QHS  . midodrine  5 mg Oral TID WC  . multivitamin with minerals  1 tablet Oral  Daily  . rosuvastatin  10 mg Oral QHS  .  sodium chloride flush  10-40 mL Intracatheter Q12H  . tamsulosin  0.4 mg Oral QHS    Infusions: . sodium chloride    . milrinone 0.25 mcg/kg/min (09/05/19 1840)    PRN Medications: acetaminophen **OR** acetaminophen, antiseptic oral rinse, ondansetron **OR** ondansetron (ZOFRAN) IV, sodium chloride flush   Assessment/Plan   1. Acute on chronic systolic CHF: Suspect primarily ischemic cardiomyopathy but has now developed RV failure in setting of atrial fibrillation and elevated PA pressure. Echo in 4/21 with EF 15-20%, moderate LV dilation, mildly dilated RV with severely decreased systolic function, moderate-severe TR, moderate-severe MR.  Device interrogation shows suboptimal BiV pacing (84%) in setting of persistent atrial fibrillation.   CO-OX 68% .Cut milrinone to 0.125 mcg.   CVP 3. Stop IV lasix.  - Continue midodrine 5 mg bid.  - Off HF meds With soft BP and elevated creatinine, will hold off on other HF meds for now.   - With age and dementia, not candidate for advanced therapies.  -Plan for cardioversion today.   2. Atrial fibrillation: Persistent.  He has had 2 atrial fibrillation ablations in the past, 5/10 and 2/12.  He was on sotalol in the past as well.  He appears to have gone into atrial fibrillation (by device interrogation) around 5/20.  No attempt was made to get him back into NSR after that.  His BiV pacing percentage has dropped.   - Continue amiodarone 400 mg bid.  - Continue Eliquis 2.5 mg bid in preparation for TEE-guided DCCV (Xarelto 10 mg daily was inadequate dose).   - Will plan TEE-guided DCCV today.  3. AKI on CKD stage 3: Creatinine baseline appears to be around 1.4, increased to 2.  Creatinine down to 1.7 with milrinone. 4. CAD: S/p CABG.  No chest pain.  No recent coronary angiography.  No evidence for ACS this admission.  - Continue Crestor and Zetia.  - On anticoagulation, no ASA.  5. Venous stasis ulcers: Wound care following, unna boots.  6.  Valvular heart disease: Moderate-severe MR and TR, suspect functional due to annular dilation.  7. Dementia: on Aricept.  8. L knee pain Check Uric Acid today. May need prednisone.   Length of Stay: 4  Luis Clegg, NP  09/06/2019, 7:50 AM  Advanced Heart Failure Team Pager [phone] (M-F; 7a - 4p)  Please contact [facility] for night-coverage after hours (4p -7a ) and weekends on [email or web address]  Patient seen with NP, agree with the above note.   CVP down to 3, co-ox 68%.  He appears to still be in underlying atrial fibrillation.   He is not volume overloaded on exam.    Today, will plan TEE-guided DCCV to NSR.  Will decrease milrinone to 0.125 now and stop it just prior to DCCV.   Stop IV Lasix today with low CVP. Creatinine trending down to 1.7.   He has had discussion with palliative care, thinking about home with hospice.   [name] 09/06/2019 8:18 AM

## 2019-09-06 NOTE — Progress Notes (Addendum)
Patient ID: Luis Palmer, male   DOB: 01-30-1933, 86 y.o.   MRN: [id number]     Advanced Heart Failure Rounding Note  PCP-Cardiologist: No primary care provider on file.   Subjective:    Remains on milrinone 0.25 mcg. CO-OX 68%.   Yesterday diuresed with IV lasix. Negative >1 liter. Weight down 1 pounds.   Complaining of right knee pain. Denies SOB   Objective:   Weight Range: 92.4 kg Body mass index is 27.63 kg/m.   Vital Signs:   Temp:  [97.2 F (36.2 C)-97.7 F (36.5 C)] 97.6 F (36.4 C) (04/09 0300) Pulse Rate:  [74-84] 84 (04/09 0300) Resp:  [13-22] 19 (04/09 0300) BP: (69-101)/(44-54) 91/48 (04/09 0300) SpO2:  [96 %-99 %] 96 % (04/09 0300) Weight:  [92.4 kg] 92.4 kg (04/09 0241) Last BM Date: 09/02/19  Weight change: Filed Weights   09/04/19 0314 09/05/19 0312 09/06/19 0241  Weight: 92.2 kg 92.7 kg 92.4 kg    Intake/Output:   Intake/Output Summary (Last 24 hours) at 09/06/2019 0750 Last data filed at 09/06/2019 0241 Gross per 24 hour  Intake 368.24 ml  Output 1700 ml  Net -1331.76 ml      Physical Exam   CVP 3 personally checked.  General:   No resp difficulty HEENT: normal Neck: supple. no JVD. Carotids 2+ bilat; no bruits. No lymphadenopathy or thryomegaly appreciated. Cor: PMI nondisplaced. Regular rate & rhythm. No rubs, gallops or murmurs. Lungs: clear Abdomen: soft, nontender, nondistended. No hepatosplenomegaly. No bruits or masses. Good bowel sounds. Extremities: no cyanosis, clubbing, rash, R and LLE unna boots. Still with upper thigh edema.  Neuro: alert & orientedx3, cranial nerves grossly intact. moves all 4 extremities w/o difficulty. Affect pleasant   Telemetry   V-paced, difficult to tell atrial rhythm.  Personally reviewed.   Labs    CBC Recent Labs    09/03/19 0836 09/04/19 0232 09/05/19 0340 09/06/19 0430  WBC 5.1   < > 5.4 5.4  NEUTROABS 2.5  --   --   --   HGB 12.2*   < > 11.5* 11.0*  HCT 38.3*   < > 34.5* 33.2*    MCV 97.7   < > 95.0 96.5  PLT 124*   < > 112* 106*   < > = values in this interval not displayed.   Basic Metabolic Panel Recent Labs    09/05/19 0340 09/06/19 0430  NA 138 137  K 3.3* 3.9  CL 99 98  CO2 28 28  GLUCOSE 140* 119*  BUN 48* 44*  CREATININE 1.82* 1.71*  CALCIUM 9.1 8.9  MG 2.1 1.9   Liver Function Tests Recent Labs    09/03/19 0836  AST 46*  ALT 31  ALKPHOS 83  BILITOT 2.2*  PROT 6.0*  ALBUMIN 3.1*   No results for input(s): LIPASE, AMYLASE in the last 72 hours. Cardiac Enzymes No results for input(s): CKTOTAL, CKMB, CKMBINDEX, TROPONINI in the last 72 hours.  BNP: BNP (last 3 results) Recent Labs    09/02/19 1900  BNP >4,500.0*    ProBNP (last 3 results) No results for input(s): PROBNP in the last 8760 hours.   D-Dimer No results for input(s): DDIMER in the last 72 hours. Hemoglobin A1C No results for input(s): HGBA1C in the last 72 hours. Fasting Lipid Panel No results for input(s): CHOL, HDL, LDLCALC, TRIG, CHOLHDL, LDLDIRECT in the last 72 hours. Thyroid Function Tests No results for input(s): TSH, T4TOTAL, T3FREE, THYROIDAB in the last 72 hours.  Invalid input(s): FREET3  Other results:   Imaging    VAS [place] LOWER EXTREMITY VENOUS (DVT)  Result Date: 09/05/2019  Lower Venous DVTStudy Indications: Pain, and Swelling.  Limitations: Bilateral Unna boots. Comparison Study: No prior study Performing Technologist: Maudry Mayhew MHA, RDMS, RVT, RDCS  Examination Guidelines: A complete evaluation includes B-mode imaging, spectral Doppler, color Doppler, and power Doppler as needed of all accessible portions of each vessel. Bilateral testing is considered an integral part of a complete examination. Limited examinations for reoccurring indications may be performed as noted. The reflux portion of the exam is performed with the patient in reverse Trendelenburg.  +---------+---------------+---------+-----------+----------+--------------+  RIGHT    CompressibilityPhasicitySpontaneityPropertiesThrombus Aging +---------+---------------+---------+-----------+----------+--------------+ CFV      Full           No       Yes                                 +---------+---------------+---------+-----------+----------+--------------+ SFJ      Full                                                        +---------+---------------+---------+-----------+----------+--------------+ FV Prox  Full                                                        +---------+---------------+---------+-----------+----------+--------------+ FV Mid   Full                                                        +---------+---------------+---------+-----------+----------+--------------+ FV DistalFull                                                        +---------+---------------+---------+-----------+----------+--------------+ PFV      Full                                                        +---------+---------------+---------+-----------+----------+--------------+ POP      Full           No       Yes                                 +---------+---------------+---------+-----------+----------+--------------+   Right Technical Findings: Not visualized segments include PTV, peroneal veins.  +---------+---------------+---------+-----------+----------+--------------+ LEFT     CompressibilityPhasicitySpontaneityPropertiesThrombus Aging +---------+---------------+---------+-----------+----------+--------------+ CFV      Full           No       Yes                                 +---------+---------------+---------+-----------+----------+--------------+  SFJ      Full                                                        +---------+---------------+---------+-----------+----------+--------------+ FV Prox  Full                                                         +---------+---------------+---------+-----------+----------+--------------+ FV Mid   Full                                                        +---------+---------------+---------+-----------+----------+--------------+ FV DistalFull                                                        +---------+---------------+---------+-----------+----------+--------------+ PFV      Full                                                        +---------+---------------+---------+-----------+----------+--------------+ POP      Full           No       Yes                                 +---------+---------------+---------+-----------+----------+--------------+   Left Technical Findings: Not visualized segments include PTV, peroneal veins.   Summary: RIGHT: - There is no evidence of deep vein thrombosis in the lower extremity. However, portions of this examination were limited- see technologist comments above.  - No cystic structure found in the popliteal fossa.  LEFT: - There is no evidence of deep vein thrombosis in the lower extremity. However, portions of this examination were limited- see technologist comments above.  - No cystic structure found in the popliteal fossa.  *See table(s) above for measurements and observations. Electronically signed by Curt Jews MD on 09/05/2019 at 3:12:20 PM.    Final      Medications:     Scheduled Medications: . amiodarone  400 mg Oral BID  . apixaban  2.5 mg Oral BID  . Chlorhexidine Gluconate Cloth  6 each Topical Daily  . ciprofloxacin  250 mg Oral BID  . diclofenac Sodium  4 g Topical QID  . donepezil  5 mg Oral QHS  . ezetimibe  10 mg Oral Daily  . feeding supplement (ENSURE ENLIVE)  237 mL Oral BID BM  . furosemide  80 mg Intravenous BID  . insulin aspart  0-6 Units Subcutaneous TID WC  . latanoprost  1 drop Both Eyes QHS  . midodrine  5 mg Oral TID WC  . multivitamin with minerals  1 tablet Oral  Daily  . rosuvastatin  10 mg Oral QHS  .  sodium chloride flush  10-40 mL Intracatheter Q12H  . tamsulosin  0.4 mg Oral QHS    Infusions: . sodium chloride    . milrinone 0.25 mcg/kg/min (09/05/19 1840)    PRN Medications: acetaminophen **OR** acetaminophen, antiseptic oral rinse, ondansetron **OR** ondansetron (ZOFRAN) IV, sodium chloride flush   Assessment/Plan   1. Acute on chronic systolic CHF: Suspect primarily ischemic cardiomyopathy but has now developed RV failure in setting of atrial fibrillation and elevated PA pressure. Echo in 4/21 with EF 15-20%, moderate LV dilation, mildly dilated RV with severely decreased systolic function, moderate-severe TR, moderate-severe MR.  Device interrogation shows suboptimal BiV pacing (84%) in setting of persistent atrial fibrillation.   CO-OX 68% .Cut milrinone to 0.125 mcg.   CVP 3. Stop IV lasix.  - Continue midodrine 5 mg bid.  - Off HF meds With soft BP and elevated creatinine, will hold off on other HF meds for now.   - With age and dementia, not candidate for advanced therapies.  -Plan for cardioversion today.   2. Atrial fibrillation: Persistent.  He has had 2 atrial fibrillation ablations in the past, 5/10 and 2/12.  He was on sotalol in the past as well.  He appears to have gone into atrial fibrillation (by device interrogation) around 5/20.  No attempt was made to get him back into NSR after that.  His BiV pacing percentage has dropped.   - Continue amiodarone 400 mg bid.  - Continue Eliquis 2.5 mg bid in preparation for TEE-guided DCCV (Xarelto 10 mg daily was inadequate dose).   - Will plan TEE-guided DCCV today.  3. AKI on CKD stage 3: Creatinine baseline appears to be around 1.4, increased to 2.  Creatinine down to 1.7 with milrinone. 4. CAD: S/p CABG.  No chest pain.  No recent coronary angiography.  No evidence for ACS this admission.  - Continue Crestor and Zetia.  - On anticoagulation, no ASA.  5. Venous stasis ulcers: Wound care following, unna boots.  6.  Valvular heart disease: Moderate-severe MR and TR, suspect functional due to annular dilation.  7. Dementia: on Aricept.  8. L knee pain Check Uric Acid today. May need prednisone.   Length of Stay: 4  Amy Clegg, NP  09/06/2019, 7:50 AM  Advanced Heart Failure Team Pager [phone] (M-F; 7a - 4p)  Please contact [facility] for night-coverage after hours (4p -7a ) and weekends on [email or web address]  Patient seen with NP, agree with the above note.   CVP down to 3, co-ox 68%.  He appears to still be in underlying atrial fibrillation.   He is not volume overloaded on exam.    Today, will plan TEE-guided DCCV to NSR.  Will decrease milrinone to 0.125 now and stop it just prior to DCCV.   Stop IV Lasix today with low CVP. Creatinine trending down to 1.7.   He has had discussion with palliative care, thinking about home with hospice.   [name] 09/06/2019 8:18 AM

## 2019-09-06 NOTE — Interval H&P Note (Signed)
History and Physical Interval Note:  09/06/2019 12:52 PM  Luis Palmer  has presented today for surgery, with the diagnosis of AFIB.  The various methods of treatment have been discussed with the patient and family. After consideration of risks, benefits and other options for treatment, the patient has consented to  Procedure(s): TRANSESOPHAGEAL ECHOCARDIOGRAM (TEE) (N/A) CARDIOVERSION (N/A) as a surgical intervention.  The patient's history has been reviewed, patient examined, no change in status, stable for surgery.  I have reviewed the patient's chart and labs.  Questions were answered to the patient's satisfaction.     Ulas Zuercher [employer]

## 2019-09-06 NOTE — Anesthesia Preprocedure Evaluation (Addendum)
Anesthesia Evaluation  Patient identified by MRN, date of birth, ID band Patient awake    Reviewed: Allergy & Precautions, H&P , NPO status , Patient's Chart, lab work & pertinent test results  Airway Mallampati: II  TM Distance: >3 FB Neck ROM: full    Dental  (+) Chipped, Dental Advisory Given   Pulmonary sleep apnea , pneumonia, resolved, former smoker,     + decreased breath sounds  rales    Cardiovascular hypertension, Pt. on medications + CAD, + Past MI, + CABG and +CHF  + dysrhythmias Atrial Fibrillation + pacemaker + Cardiac Defibrillator  Rhythm:regular Rate:Normal  LVEF <30%   Neuro/Psych negative psych ROS   GI/Hepatic negative GI ROS, Neg liver ROS,   Endo/Other  diabetes, Well Controlled, Type 2, Oral Hypoglycemic AgentsHyperlipidemia  Renal/GU Renal InsufficiencyRenal disease   BPH    Musculoskeletal negative musculoskeletal ROS (+)   Abdominal   Peds  Hematology  (+) anemia , Thrombocytopenia Coumadin therapy- INR 3.1   Anesthesia Other Findings Luis Palmer is a 86 y.o. male w/ hx of severe ICM sp ICD, afib ablation x 2, CAD sp CABG, chron systolic CHF, chron afib. Chronic systolic heart failure with EF less than 30%, by AV pacemaker, chronic atrial fibrillation, CAD with known occlusion of the saphenous vein graft to right coronary and patent LIMA to LAD at time of cath in 2004, no anginal complaints, obstructive sleep apnea.Marland Kitchen  He is on stable beta-blocker and ARB therapy.  He has mild  heart failure symptoms and no anginal complaints.   Moderate JVD with the patient lying at 20 degrees.  Lungs clear anteriorly with faint basilar crackles posteriorly.  No significant murmurs are heard.  No peripheral edema. He is adequately anticoagulated  I  Reproductive/Obstetrics                         Anesthesia Physical  Anesthesia Plan  ASA: IV  Anesthesia Plan: General   Post-op  Pain Management:    Induction: Intravenous  PONV Risk Score and Plan: 2 and Ondansetron and Treatment may vary due to age or medical condition  Airway Management Planned: Mask, Natural Airway and Nasal Cannula  Additional Equipment:   Intra-op Plan:   Post-operative Plan:   Informed Consent: I have reviewed the patients History and Physical, chart, labs and discussed the procedure including the risks, benefits and alternatives for the proposed anesthesia with the patient or authorized representative who has indicated his/her understanding and acceptance.       Plan Discussed with: CRNA, Anesthesiologist and Surgeon  Anesthesia Plan Comments:        Anesthesia Quick Evaluation

## 2019-09-06 NOTE — Procedures (Addendum)
Electrical Cardioversion Procedure Note Luis Palmer [id number] 1933/02/06  Procedure: Electrical Cardioversion Indications:  Atrial Fibrillation  Procedure Details Consent: Risks of procedure as well as the alternatives and risks of each were explained to the (patient/caregiver).  Consent for procedure obtained. Time Out: Verified patient identification, verified procedure, site/side was marked, verified correct patient position, special equipment/implants available, medications/allergies/relevent history reviewed, required imaging and test results available.  Performed  Patient placed on cardiac monitor, pulse oximetry, supplemental oxygen as necessary.  Sedation given: Propofol per anesthesiology Pacer pads placed anterior and posterior chest.  Cardioverted 1 time(s).  Cardioverted at [place].  Evaluation Findings: Post procedure EKG shows: NSR Complications: None Patient did tolerate procedure well.   Luis Palmer 09/06/2019, 1:11 PM

## 2019-09-06 NOTE — Anesthesia Postprocedure Evaluation (Signed)
Anesthesia Post Note  Patient: Luis Palmer  Procedure(s) Performed: TRANSESOPHAGEAL ECHOCARDIOGRAM (TEE) (N/A ) CARDIOVERSION (N/A )     Patient location during evaluation: PACU Anesthesia Type: General Level of consciousness: awake and alert Pain management: pain level controlled Vital Signs Assessment: post-procedure vital signs reviewed and stable Respiratory status: spontaneous breathing, nonlabored ventilation, respiratory function stable and patient connected to nasal cannula oxygen Cardiovascular status: stable and blood pressure returned to baseline Postop Assessment: no apparent nausea or vomiting Anesthetic complications: no    Last Vitals:  Vitals:   09/06/19 1430 09/06/19 1450  BP: (!) 90/51 (!) 94/46  Pulse: 69   Resp: 20 20  Temp: 36.5 C 36.4 C  SpO2: 97%     Last Pain:  Vitals:   09/06/19 1450  TempSrc: Oral  PainSc:                  Enedina Pair DAVID

## 2019-09-06 NOTE — Progress Notes (Signed)
PROGRESS NOTE    Luis Palmer  YYT:[id number] DOB: 02/12/1933 DOA: 09/02/2019 PCP: Idelle Crouch, MD   Subjective: The patient was seen and examined this morning, awake mildly lethargic, wife present at bedside.  Denies any shortness of breath or chest pain.  Noted to be hypotensive.  Systolic in 27N.  Patient also had a Doppler study yesterday of lower extremities which ruled out DVT. on milrinone 0.25 mcg. CO-OX 68%.  Yesterday diuresed with IV lasix. Negative >1 liter. Weight down 1 pounds.    Confirmed with the patient and wife that palliative care team has met with the patient and his wife when they agreed to be discharged home has accepted hospice.  Cardiology also has met with the family, planning to DC Lasix and Milrinone, he remains on midodrine.  PICC line in place  Confirmed with the patient and his wife that he is DNR/DNI   -------------------------------------------------------------------------------------------------------------------------------- Brief Narrative:  86 year old with history of CAD status post CABG, systolic CHF EF 17% status post ICD, chronic A. fib, right-sided no carcinoma of colon status post partial resection, iron deficiency anemia, CKD stage IIIa, DM2 presented with worsening shortness of breath and peripheral edema.  Found to be in CHF exacerbation, started on diuretics and admitted to the hospital.  CHF team following, PICC line placed.  Considering starting milrinone.    -------------------------------------------------------------------------------------------------------------------------------  Assessment & Plan:   Principal Problem:   Acute on chronic systolic CHF (congestive heart failure) (HCC) Active Problems:   Adenocarcinoma of colon (HCC)   Biventricular implantable cardioverter-defibrillator in situ   Type II diabetes mellitus (HCC)   CKD (chronic kidney disease) stage 3, GFR 30-59 ml/min   Pancytopenia (HCC)  CHF (congestive heart failure) (HCC)   Protein-calorie malnutrition, severe  Acute congestive heart failure with reduced ejection fraction, 20%.  Class III -Echocardiogram here showed EF of 15-20%.  Reduced ventricular functions. -PICC line placed 4/6.   -Per cardiology: Planning to DC Lasix and milrinone- -continue midodrine for hypotension -Strict input and output. -Monitor electrolytes and replete as necessary -Cardiology team following--planning for TEE-guided DCCV today. ..  09/06/2019  Hypotensive : Running in 70s -Cardiology on midodrine   acute kidney injury on CKD stage III A -Baseline creatinine 1.4.  Admission creatinine 2.0. >>  2.09 >>> 1.82 >> 1.71 -Monitor closely while on diuretics.  Closely monitor renal function. -We will hold Entresto  Coronary artery disease status post CABG -Currently chest pain-free.  Chronic atrial fibrillation -Continue Xarelto switched to Eliquis per cardiology  History of diabetes mellitus type 2 -Sliding scale  Anemia of chronic disease.  Also has iron deficiency -Follow-up patient oncology.  Closely monitor hemoglobin.  History of dementia -On Aricept  Right lower extremity edema/wounds noted- seen by wound care team-Unna boot been placed -We will obtain a limited lower extremity Doppler study to rule out DVT  DVT prophylaxis: Xarelto was switched for Eliquis on this admission Code Status: DNR  -patient and his wife confirmed DNR/DNI status --they met with palliative, has agreed to hospice service at home.  Family Communication: Wife is at bedside Disposition Plan:   Patient From= Home  Patient Anticipated D/C place= Home with hospice in 1 to 2 days  Barriers= maintain hospital stay for IV diuretics and inotrope arrangements.    Objective: Vitals:   09/05/19 2345 09/06/19 0241 09/06/19 0300 09/06/19 0803  BP: (!) 99/54  (!) 91/48 (!) 81/46  Pulse: 82  84 82  Resp: '17  19 14  ' Temp: 97.6 F (  36.4 C)  97.6 F (36.4  C) 98.1 F (36.7 C)  TempSrc: Axillary  Oral Oral  SpO2: 98%  96% 96%  Weight:  92.4 kg    Height:        Intake/Output Summary (Last 24 hours) at 09/06/2019 0950 Last data filed at 09/06/2019 0811 Gross per 24 hour  Intake 368.24 ml  Output 2100 ml  Net -1731.76 ml   Filed Weights   09/04/19 0314 09/05/19 0312 09/06/19 0241  Weight: 92.2 kg 92.7 kg 92.4 kg   BP (!) 81/46 (BP Location: Left Arm)   Pulse 82   Temp 98.1 F (36.7 C) (Oral)   Resp 14   Ht 6' (1.829 m)   Wt 92.4 kg   SpO2 96%   BMI 27.63 kg/m    Physical Exam  Constitution:  Alert, cooperative, no distress,  Psychiatric: Normal and stable mood and affect, cognition intact,   HEENT: Normocephalic, PERRL, otherwise with in Normal limits  Chest:Chest symmetric Cardio vascular:  S1/S2, irregularly irregular, + murmure, No Rubs Pulmonary: Clear to auscultation bilaterally, respirations unlabored, negative wheezes / crackles Abdomen: Soft, non-tender, non-distended, bowel sounds,no masses, no organomegaly Muscular skeletal: Limited exam - in bed, able to move all 4 extremities, Normal strength,  Neuro: CNII-XII intact. , normal motor and sensation, reflexes intact  Extremities: +2  Right lower extremity edema, +2 pulses  Skin: Dry, warm to touch, negative for any Rashes, No open wounds Wounds: Right lower extremity, and Unna boot   Data Reviewed:   CBC: Recent Labs  Lab 09/02/19 1823 09/03/19 0836 09/04/19 0232 09/05/19 0340 09/06/19 0430  WBC 3.8* 5.1 4.8 5.4 5.4  NEUTROABS 1.5* 2.5  --   --   --   HGB 12.7* 12.2* 13.1 11.5* 11.0*  HCT 39.2 38.3* 40.7 34.5* 33.2*  MCV 96.6 97.7 97.4 95.0 96.5  PLT 129* 124* 123* 112* 173*   Basic Metabolic Panel: Recent Labs  Lab 09/02/19 1823 09/03/19 0836 09/04/19 0232 09/05/19 0340 09/06/19 0430  NA 130* 136 136 138 137  K 3.7 3.8 4.3 3.3* 3.9  CL 94* 97* 97* 99 98  CO2 '24 23 22 28 28  ' GLUCOSE 131* 107* 128* 140* 119*  BUN 49* 50* 50* 48* 44*    CREATININE 2.07* 2.09* 2.09* 1.82* 1.71*  CALCIUM 9.5 9.4 9.7 9.1 8.9  MG  --   --  2.3 2.1 1.9   GFR: Estimated Creatinine Clearance: 34.7 mL/min (A) (by C-G formula based on SCr of 1.71 mg/dL (H)). Liver Function Tests: Recent Labs  Lab 09/02/19 1823 09/03/19 0836  AST 28 46*  ALT 20 31  ALKPHOS 96 83  BILITOT 2.0* 2.2*  PROT 6.3* 6.0*  ALBUMIN 3.4* 3.1*   No results for input(s): LIPASE, AMYLASE in the last 168 hours. No results for input(s): AMMONIA in the last 168 hours. Coagulation Profile: Recent Labs  Lab 09/02/19 1823 09/06/19 0440  INR 4.3* 3.1*    CBG: Recent Labs  Lab 09/05/19 0621 09/05/19 1058 09/05/19 1612 09/05/19 2120 09/06/19 0648  GLUCAP 126* 147* 202* 174* 120*    Recent Results (from the past 240 hour(s))  SARS CORONAVIRUS 2 (TAT 6-24 HRS) Nasopharyngeal Nasopharyngeal Swab     Status: None   Collection Time: 09/03/19  1:59 AM   Specimen: Nasopharyngeal Swab  Result Value Ref Range Status   SARS Coronavirus 2 NEGATIVE NEGATIVE Final    Comment: (NOTE) SARS-CoV-2 target nucleic acids are NOT DETECTED. The SARS-CoV-2 RNA is generally detectable  in upper and lower respiratory specimens during the acute phase of infection. Negative results do not preclude SARS-CoV-2 infection, do not rule out co-infections with other pathogens, and should not be used as the sole basis for treatment or other patient management decisions. Negative results must be combined with clinical observations, patient history, and epidemiological information. The expected result is Negative. Fact Sheet for Patients: SugarRoll.be Fact Sheet for Healthcare Providers: https://www.woods-mathews.com/ This test is not yet approved or cleared by the [place] FDA and  has been authorized for detection and/or diagnosis of SARS-CoV-2 by FDA under an Emergency Use Authorization (EUA). This EUA will remain  in effect (meaning this  test can be used) for the duration of the COVID-19 declaration under Section 56 4(b)(1) of the Act, 21 U.S.C. section 360bbb-3(b)(1), unless the authorization is terminated or revoked sooner. Performed at [facility], [place] [address], [place], [place] [address]   MRSA PCR Screening     Status: None   Collection Time: 09/03/19  6:34 PM   Specimen: Nasal Mucosa; Nasopharyngeal  Result Value Ref Range Status   MRSA by PCR NEGATIVE NEGATIVE Final    Comment:        The GeneXpert MRSA Assay (FDA approved for NASAL specimens only), is one component of a comprehensive MRSA colonization surveillance program. It is not intended to diagnose MRSA infection nor to guide or monitor treatment for MRSA infections. Performed at [facility], [place] [address], [place], [place] [address]          Radiology Studies: VAS [place] LOWER EXTREMITY VENOUS (DVT)  Result Date: 09/05/2019  Lower Venous DVTStudy Indications: Pain, and Swelling.  Limitations: Bilateral Unna boots. Comparison Study: No prior study Performing Technologist: Maudry Mayhew MHA, RDMS, RVT, RDCS  Examination Guidelines: A complete evaluation includes B-mode imaging, spectral Doppler, color Doppler, and power Doppler as needed of all accessible portions of each vessel. Bilateral testing is considered an integral part of a complete examination. Limited examinations for reoccurring indications may be performed as noted. The reflux portion of the exam is performed with the patient in reverse Trendelenburg.  +---------+---------------+---------+-----------+----------+--------------+ RIGHT    CompressibilityPhasicitySpontaneityPropertiesThrombus Aging +---------+---------------+---------+-----------+----------+--------------+ CFV      Full           No       Yes                                 +---------+---------------+---------+-----------+----------+--------------+ SFJ      Full                                                         +---------+---------------+---------+-----------+----------+--------------+ FV Prox  Full                                                        +---------+---------------+---------+-----------+----------+--------------+ FV Mid   Full                                                        +---------+---------------+---------+-----------+----------+--------------+  FV DistalFull                                                        +---------+---------------+---------+-----------+----------+--------------+ PFV      Full                                                        +---------+---------------+---------+-----------+----------+--------------+ POP      Full           No       Yes                                 +---------+---------------+---------+-----------+----------+--------------+   Right Technical Findings: Not visualized segments include PTV, peroneal veins.  +---------+---------------+---------+-----------+----------+--------------+ LEFT     CompressibilityPhasicitySpontaneityPropertiesThrombus Aging +---------+---------------+---------+-----------+----------+--------------+ CFV      Full           No       Yes                                 +---------+---------------+---------+-----------+----------+--------------+ SFJ      Full                                                        +---------+---------------+---------+-----------+----------+--------------+ FV Prox  Full                                                        +---------+---------------+---------+-----------+----------+--------------+ FV Mid   Full                                                        +---------+---------------+---------+-----------+----------+--------------+ FV DistalFull                                                        +---------+---------------+---------+-----------+----------+--------------+ PFV      Full                                                         +---------+---------------+---------+-----------+----------+--------------+ POP      Full           No       Yes                                 +---------+---------------+---------+-----------+----------+--------------+  Left Technical Findings: Not visualized segments include PTV, peroneal veins.   Summary: RIGHT: - There is no evidence of deep vein thrombosis in the lower extremity. However, portions of this examination were limited- see technologist comments above.  - No cystic structure found in the popliteal fossa.  LEFT: - There is no evidence of deep vein thrombosis in the lower extremity. However, portions of this examination were limited- see technologist comments above.  - No cystic structure found in the popliteal fossa.  *See table(s) above for measurements and observations. Electronically signed by Curt Jews MD on 09/05/2019 at 3:12:20 PM.    Final         Scheduled Meds: . amiodarone  400 mg Oral BID  . apixaban  2.5 mg Oral BID  . Chlorhexidine Gluconate Cloth  6 each Topical Daily  . ciprofloxacin  250 mg Oral BID  . diclofenac Sodium  4 g Topical QID  . donepezil  5 mg Oral QHS  . ezetimibe  10 mg Oral Daily  . feeding supplement (ENSURE ENLIVE)  237 mL Oral BID BM  . insulin aspart  0-6 Units Subcutaneous TID WC  . latanoprost  1 drop Both Eyes QHS  . midodrine  5 mg Oral TID WC  . multivitamin with minerals  1 tablet Oral Daily  . rosuvastatin  10 mg Oral QHS  . sodium chloride flush  10-40 mL Intracatheter Q12H  . tamsulosin  0.4 mg Oral QHS   Continuous Infusions: . sodium chloride    . magnesium sulfate bolus IVPB 2 g (09/06/19 0915)  . milrinone 0.125 mcg/kg/min (09/06/19 0855)     LOS: 4 days   Time spent= 35 mins    Deatra James, MD [facility]  If 7PM-7AM, please contact night-coverage  09/06/2019, 9:50 AM

## 2019-09-06 NOTE — TOC Progression Note (Addendum)
Transition of Care [facility]) - Progression Note    Patient Details  Name: Luis Palmer MRN: [id number] Date of Birth: [birth year]  Transition of Care [facility] CM/SW Contact  Zenon Mayo, RN Phone Number: 09/06/2019, 9:08 AM  Clinical Narrative:    NCM received consult concerning discussion about milrinone at home under home hospice.  NCM spoke with wife and she would like for the agency rep to come this am.  NCM informed her it would be [facility].   NCM contacted Dorian Pod with [facility], she will be here in the patient's room at 1030 to discuss.   11:59 - NCM received call from [facility] with [facility], she states sounds like family is going to go with Hospice, she will get the DME ready for patient.  Dorian Pod states she will contact Carolynn Sayers about the milrinone set up.  NCM tried to contact patient wife, line was busy will call back.         Expected Discharge Plan and Services                                                 Social Determinants of Health (SDOH) Interventions    Readmission Risk Interventions No flowsheet data found.

## 2019-09-07 DIAGNOSIS — Z515 Encounter for palliative care: Secondary | ICD-10-CM | POA: Diagnosis not present

## 2019-09-07 DIAGNOSIS — I5023 Acute on chronic systolic (congestive) heart failure: Secondary | ICD-10-CM | POA: Diagnosis not present

## 2019-09-07 DIAGNOSIS — Z7189 Other specified counseling: Secondary | ICD-10-CM | POA: Diagnosis not present

## 2019-09-07 LAB — COOXEMETRY PANEL
Carboxyhemoglobin: 2.1 % — ABNORMAL HIGH (ref 0.5–1.5)
Methemoglobin: 0.9 % (ref 0.0–1.5)
O2 Saturation: 65.4 %
Total hemoglobin: 11.3 g/dL — ABNORMAL LOW (ref 12.0–16.0)

## 2019-09-07 LAB — BASIC METABOLIC PANEL
Anion gap: 11 (ref 5–15)
BUN: 45 mg/dL — ABNORMAL HIGH (ref 8–23)
CO2: 29 mmol/L (ref 22–32)
Calcium: 9 mg/dL (ref 8.9–10.3)
Chloride: 99 mmol/L (ref 98–111)
Creatinine, Ser: 1.69 mg/dL — ABNORMAL HIGH (ref 0.61–1.24)
GFR calc Af Amer: 42 mL/min — ABNORMAL LOW (ref >60)
GFR calc non Af Amer: 36 mL/min — ABNORMAL LOW (ref >60)
Glucose, Bld: 114 mg/dL — ABNORMAL HIGH (ref 70–99)
Potassium: 4.3 mmol/L (ref 3.5–5.1)
Sodium: 139 mmol/L (ref 135–145)

## 2019-09-07 LAB — MAGNESIUM: Magnesium: 2.4 mg/dL (ref 1.7–2.4)

## 2019-09-07 LAB — GLUCOSE, CAPILLARY
Glucose-Capillary: 113 mg/dL — ABNORMAL HIGH (ref 70–99)
Glucose-Capillary: 164 mg/dL — ABNORMAL HIGH (ref 70–99)
Glucose-Capillary: 263 mg/dL — ABNORMAL HIGH (ref 70–99)
Glucose-Capillary: 206 mg/dL — ABNORMAL HIGH (ref 70–99)

## 2019-09-07 LAB — CBC
HCT: 33.8 % — ABNORMAL LOW (ref 39.0–52.0)
Hemoglobin: 10.7 g/dL — ABNORMAL LOW (ref 13.0–17.0)
MCH: 31.2 pg (ref 26.0–34.0)
MCHC: 31.7 g/dL (ref 30.0–36.0)
MCV: 98.5 fL (ref 80.0–100.0)
Platelets: 103 10*3/uL — ABNORMAL LOW (ref 150–400)
RBC: 3.43 MIL/uL — ABNORMAL LOW (ref 4.22–5.81)
RDW: 19.1 % — ABNORMAL HIGH (ref 11.5–15.5)
WBC: 5 10*3/uL (ref 4.0–10.5)
nRBC: 0 % (ref 0.0–0.2)

## 2019-09-07 MED ORDER — DIGOXIN 125 MCG PO TABS: 0.1250 mg | ORAL_TABLET | Freq: Every day | ORAL | Status: DC

## 2019-09-07 MED ORDER — DIGOXIN 125 MCG PO TABS
0.0625 mg | ORAL_TABLET | Freq: Every day | ORAL | Status: DC
  Administered 2019-09-07: 0.0625 mg via ORAL
  Filled 2019-09-07: qty 1

## 2019-09-07 MED ORDER — TORSEMIDE 20 MG PO TABS
40.0000 mg | ORAL_TABLET | Freq: Every day | ORAL | Status: DC
  Administered 2019-09-07: 40 mg via ORAL
  Filled 2019-09-07: qty 2

## 2019-09-07 MED ORDER — PREDNISONE 20 MG PO TABS
40.0000 mg | ORAL_TABLET | Freq: Every day | ORAL | Status: DC
  Administered 2019-09-07 – 2019-09-08 (×2): 40 mg via ORAL
  Filled 2019-09-07 (×2): qty 2

## 2019-09-07 NOTE — Care Management (Signed)
Secure chatted attending to clarify potential DC date on behalf of [facility] liaison. Dr Emilee Hero- Bonsu states he anticipates DC when home needs situated.  [facility] will arrange for delivery of home hospice DME today.

## 2019-09-07 NOTE — Progress Notes (Signed)
Pt had 18 beat run of v-tach. Pt asymptomatic. Will continue to monitor.

## 2019-09-07 NOTE — Progress Notes (Signed)
Patient ID: Luis Palmer, male   DOB: May 13, 1933, 86 y.o.   MRN: [id number]     Advanced Heart Failure Rounding Note  PCP-Cardiologist: No primary care provider on file.   Subjective:    He is off milrinone today, co-ox 65% this morning.  He is on midodrine 10 mg tid with SBP in 90s generally.  No lightheadedness.    CVP not hooked up but was 12 per nurse.  Weight has trended down.   Right knee painful but improved.  Has not walked.   Objective:   Weight Range: 92.1 kg Body mass index is 27.54 kg/m.   Vital Signs:   Temp:  [97.6 F (36.4 C)-98.1 F (36.7 C)] 97.9 F (36.6 C) (04/10 0720) Pulse Rate:  [65-87] 78 (04/10 0720) Resp:  [12-25] 23 (04/10 0720) BP: (69-98)/(25-59) 98/48 (04/10 0720) SpO2:  [93 %-98 %] 94 % (04/10 0720) Weight:  [92.1 kg-93.9 kg] 92.1 kg (04/10 0417) Last BM Date: 09/06/19  Weight change: Filed Weights   09/06/19 0241 09/06/19 1137 09/07/19 0417  Weight: 92.4 kg 93.9 kg 92.1 kg    Intake/Output:   Intake/Output Summary (Last 24 hours) at 09/07/2019 0957 Last data filed at 09/07/2019 0800 Gross per 24 hour  Intake 486.44 ml  Output 350 ml  Net 136.44 ml      Physical Exam   CVP 12 per nurse   General: NAD Neck: No JVD, no thyromegaly or thyroid nodule.  Lungs: Clear to auscultation bilaterally with normal respiratory effort. CV: Lateral PMI.  Heart regular S1/S2, no S3/S4, no murmur.  1+ edema to knees.   Abdomen: Soft, nontender, no hepatosplenomegaly, no distention.  Skin: Intact without lesions or rashes.  Neurologic: Alert and oriented x 3.  Psych: Normal affect. Extremities: No clubbing or cyanosis.  HEENT: Normal.    Telemetry   Think NSR with BiV pacing but difficult.  Personally reviewed.   Labs    CBC Recent Labs    09/06/19 0430 09/07/19 0407  WBC 5.4 5.0  HGB 11.0* 10.7*  HCT 33.2* 33.8*  MCV 96.5 98.5  PLT 106* 614*   Basic Metabolic Panel Recent Labs    09/06/19 0430 09/07/19 0407  NA 137  139  K 3.9 4.3  CL 98 99  CO2 28 29  GLUCOSE 119* 114*  BUN 44* 45*  CREATININE 1.71* 1.69*  CALCIUM 8.9 9.0  MG 1.9 2.4   Liver Function Tests No results for input(s): AST, ALT, ALKPHOS, BILITOT, PROT, ALBUMIN in the last 72 hours. No results for input(s): LIPASE, AMYLASE in the last 72 hours. Cardiac Enzymes No results for input(s): CKTOTAL, CKMB, CKMBINDEX, TROPONINI in the last 72 hours.  BNP: BNP (last 3 results) Recent Labs    09/02/19 1900  BNP >4,500.0*    ProBNP (last 3 results) No results for input(s): PROBNP in the last 8760 hours.   D-Dimer No results for input(s): DDIMER in the last 72 hours. Hemoglobin A1C No results for input(s): HGBA1C in the last 72 hours. Fasting Lipid Panel No results for input(s): CHOL, HDL, LDLCALC, TRIG, CHOLHDL, LDLDIRECT in the last 72 hours. Thyroid Function Tests No results for input(s): TSH, T4TOTAL, T3FREE, THYROIDAB in the last 72 hours.  Invalid input(s): FREET3  Other results:   Imaging    No results found.   Medications:     Scheduled Medications: . amiodarone  400 mg Oral BID  . apixaban  2.5 mg Oral BID  . Chlorhexidine Gluconate Cloth  6 each  Topical Daily  . ciprofloxacin  250 mg Oral BID  . diclofenac Sodium  4 g Topical QID  . digoxin  0.0625 mg Oral Daily  . donepezil  5 mg Oral QHS  . ezetimibe  10 mg Oral Daily  . feeding supplement (ENSURE ENLIVE)  237 mL Oral BID BM  . insulin aspart  0-6 Units Subcutaneous TID WC  . latanoprost  1 drop Both Eyes QHS  . midodrine  10 mg Oral TID WC  . multivitamin with minerals  1 tablet Oral Daily  . rosuvastatin  10 mg Oral QHS  . sodium chloride flush  10-40 mL Intracatheter Q12H  . tamsulosin  0.4 mg Oral QHS  . torsemide  40 mg Oral Daily    Infusions:   PRN Medications: antiseptic oral rinse, ondansetron **OR** ondansetron (ZOFRAN) IV, sodium chloride flush   Assessment/Plan   1. Acute on chronic systolic CHF: Suspect primarily ischemic  cardiomyopathy but has now developed RV failure in setting of atrial fibrillation and elevated PA pressure. Echo in 4/21 with EF 15-20%, moderate LV dilation, mildly dilated RV with severely decreased systolic function, moderate-severe TR, moderate-severe MR.  Device interrogation showed suboptimal BiV pacing (84%) in setting of persistent atrial fibrillation.  TEE-guided DCCV on 4/9 to NSR.  TEE showed EF 20%, mild-moderate RV dysfunction, mild TR, mild-moderate MR. Milrinone stopped yesterday, on midodrine to maintain BP (SBP in 90s today).  Co-ox 65% with CVP 12 today.  - With creatinine down to 1.69, I will add digoxin 0.0625 daily.  - Start torsemide 40 mg daily.  - Continue midodrine 10 mg bid.  - Will follow creatinine/co-ox over weekend, hopefully can avoid home with milrinone.  However, can restart milrinone gtt for palliation if needed (can use at home with [facility] hospice services).   - With age and dementia, not candidate for advanced therapies.   2. Atrial fibrillation: Persistent.  He has had 2 atrial fibrillation ablations in the past, 5/10 and 2/12.  He was on sotalol in the past as well.  He appears to have gone into atrial fibrillation (by device interrogation) around 5/20.  No attempt was made to get him back into NSR after that.  His BiV pacing percentage had dropped.  TEE-guided DCCV on 4/9.  I think that he is in NSR today but hard to tell for sure.  - ECG now.  - Continue amiodarone 400 mg bid.  - Continue Eliquis 2.5 mg bid.   3. AKI on CKD stage 3: Creatinine baseline appears to be around 1.4, increased to 2.  Creatinine down to 1.69 today.  4. CAD: S/p CABG.  No chest pain.  No recent coronary angiography.  No evidence for ACS this admission.  - Continue Crestor and Zetia.  - On anticoagulation, no ASA.  5. Venous stasis ulcers: Wound care following, unna boots.  6. Valvular heart disease: Moderate-severe MR and TR on TTE, but only mild TR and mild-moderate MR noted on TEE.    7. Dementia: on Aricept.  8. L knee pain: Uric acid elevated 9.7, suspect gout flare.  - Will give prednisone 40 mg daily x 3 days.    Disposition: Plan for home with hospice care when stable.  Suspect Monday.   Length of Stay: [place], MD  09/07/2019, 9:57 AM  Advanced Heart Failure Team Pager [phone] (M-F; 7a - 4p)  Please contact [facility] for night-coverage after hours (4p -7a ) and weekends on [email or web address]

## 2019-09-07 NOTE — Progress Notes (Signed)
Pt's BP was noted to be 74/31. [facility] paged. No new orders given. Will continue to monitor.

## 2019-09-07 NOTE — Care Management (Addendum)
Unsuccessful X2 to reach Dr Emilee Hero Bonsu. Spoke Dr Aundra Dubin re DC date. Wife requesting one more night to be able to set up the equipment and prepare the house. Equipment will be delivered soon.  Plan will be for patient to stay tonight and leave in the morning. [facility] will see the patient at home late tomorrow morning. Wife would like to DC via private car. She expressed that she will have difficulty getting him out of the car into the house but was concerned about the cost of PTAR. I informed her [facility] transport is the safe appropriate option. [place] to speak with her today to discuss times to see him tomorrow and encourage PTAR transport.   16:00 Spoke w DR Marchelle Folks- Bonsu (could not reach me due to my phone not working properly) discussed DC plan, all in agreement for Dc in AM.

## 2019-09-07 NOTE — Plan of Care (Signed)

## 2019-09-07 NOTE — Progress Notes (Signed)
Pt refusing CPAP at this time and states he wants to" cancel  It while he is here". He has a CPAP at home and plans to wear upon discharge. CPAP removed from room at this time.

## 2019-09-07 NOTE — Progress Notes (Signed)
PROGRESS NOTE    Luis Palmer  JYN:[id number] DOB: Apr 07, 1933 DOA: 09/02/2019 PCP: Idelle Crouch, MD   Subjective: No new complaints.  Arrangements for home equipments etc. ongoing with plan for discharge home likely tomorrow with hospice  Off milrinone, on midodrine 10 mg 3 times daily  -------------------------------------------------------------------------------------------------------------------------------- Brief Narrative:  86 year old with history of CAD status post CABG, systolic CHF EF 86% status post ICD, chronic A. fib, right-sided no carcinoma of colon status post partial resection, iron deficiency anemia, CKD stage IIIa, DM2 presented with worsening shortness of breath and peripheral edema.  Found to be in CHF exacerbation, started on diuretics and admitted to the hospital.  CHF team following, PICC line placed.  Considering starting milrinone.    -------------------------------------------------------------------------------------------------------------------------------  Assessment & Plan:   Principal Problem:   Acute on chronic systolic CHF (congestive heart failure) (HCC) Active Problems:   Adenocarcinoma of colon (HCC)   Biventricular implantable cardioverter-defibrillator in situ   Type II diabetes mellitus (HCC)   CKD (chronic kidney disease) stage 3, GFR 30-59 ml/min   Pancytopenia (HCC)   CHF (congestive heart failure) (HCC)   Protein-calorie malnutrition, severe   Palliative care by specialist   Goals of care, counseling/discussion   Protein-calorie malnutrition ([place])   Generalized weakness   Chronic pain of right knee   DNR (do not resuscitate)  Acute congestive heart failure with reduced ejection fraction, 20%.  Class III -Echocardiogram here showed EF of 15-20%.  Reduced ventricular functions. -PICC line placed 4/6.   -Milrinone discontinued -continue midodrine for hypotension -Strict input and output.      acute kidney injury on  CKD stage III A -Baseline creatinine 1.4.  Admission creatinine 2.0. >>  2.09 >>> 1.82 >> 1.71 -Monitor closely while on diuretics.  Closely monitor renal function. - Entresto held  Coronary artery disease status post CABG -Currently chest pain-free.  Chronic atrial fibrillation -Continue Xarelto switched to Eliquis per cardiology  History of diabetes mellitus type 2 -Sliding scale  Anemia of chronic disease.  Also has iron deficiency -Follow-up patient oncology.  Closely monitor hemoglobin.  History of dementia -On Aricept  Right lower extremity edema/wounds noted- seen by wound care team-Unna boot been placed -We will obtain a limited lower extremity Doppler study to rule out DVT  DVT prophylaxis: Xarelto was switched for Eliquis on this admission Code Status: DNR  -patient and his wife confirmed DNR/DNI status --they met with palliative, has agreed to hospice service at home.  Family Communication: Wife is at bedside Disposition Plan:   Patient From= Home  Patient Anticipated D/C place= Home with hospice      Objective: Vitals:   09/06/19 2337 09/07/19 0417 09/07/19 0720 09/07/19 1128  BP: (!) 92/59 (!) 83/46 (!) 98/48 (!) 89/45  Pulse: 73 74 78 (!) 49  Resp: 16 18 (!) 23 19  Temp: 98 F (36.7 C) 97.6 F (36.4 C) 97.9 F (36.6 C) (!) 97.3 F (36.3 C)  TempSrc: Oral Oral Oral Oral  SpO2: 97% 93% 94% 97%  Weight:  92.1 kg    Height:        Intake/Output Summary (Last 24 hours) at 09/07/2019 1604 Last data filed at 09/07/2019 1200 Gross per 24 hour  Intake 726.44 ml  Output 350 ml  Net 376.44 ml   Filed Weights   09/06/19 0241 09/06/19 1137 09/07/19 0417  Weight: 92.4 kg 93.9 kg 92.1 kg   BP (!) 89/45 (BP Location: Left Arm)   Pulse (!) 49  Temp (!) 97.3 F (36.3 C) (Oral)   Resp 19   Ht 6' (1.829 m)   Wt 92.1 kg   SpO2 97%   BMI 27.54 kg/m    Physical Exam  Constitution:  Alert, cooperative, no distress,  Psychiatric: Normal and stable  mood and affect, cognition intact,   HEENT: Normocephalic, PERRL, otherwise with in Normal limits  Chest:Chest symmetric Cardio vascular:  S1/S2, irregularly irregular, + murmure, No Rubs Pulmonary: Clear to auscultation bilaterally, respirations unlabored, negative wheezes / crackles Abdomen: Soft, non-tender, non-distended, bowel sounds,no masses, no organomegaly Muscular skeletal: Limited exam - in bed, able to move all 4 extremities, Normal strength,  Neuro: CNII-XII intact. , normal motor and sensation, reflexes intact  Extremities: +2  Right lower extremity edema, +2 pulses  Skin: Dry, warm to touch, negative for any Rashes, No open wounds Wounds: Right lower extremity, and Unna boot   Data Reviewed:   CBC: Recent Labs  Lab 09/02/19 1823 09/02/19 1823 09/03/19 0836 09/04/19 0232 09/05/19 0340 09/06/19 0430 09/07/19 0407  WBC 3.8*   < > 5.1 4.8 5.4 5.4 5.0  NEUTROABS 1.5*  --  2.5  --   --   --   --   HGB 12.7*   < > 12.2* 13.1 11.5* 11.0* 10.7*  HCT 39.2   < > 38.3* 40.7 34.5* 33.2* 33.8*  MCV 96.6   < > 97.7 97.4 95.0 96.5 98.5  PLT 129*   < > 124* 123* 112* 106* 103*   < > = values in this interval not displayed.   Basic Metabolic Panel: Recent Labs  Lab 09/03/19 0836 09/04/19 0232 09/05/19 0340 09/06/19 0430 09/07/19 0407  NA 136 136 138 137 139  K 3.8 4.3 3.3* 3.9 4.3  CL 97* 97* 99 98 99  CO2 _0 GLUCOSE 107* 128* 140* 119* 114*  BUN 50* 50* 48* 44* 45*  CREATININE 2.09* 2.09* 1.82* 1.71* 1.69*  CALCIUM 9.4 9.7 9.1 8.9 9.0  MG  --  2.3 2.1 1.9 2.4   GFR: Estimated Creatinine Clearance: 35.1 mL/min (A) (by C-G formula based on SCr of 1.69 mg/dL (H)). Liver Function Tests: Recent Labs  Lab 09/02/19 1823 09/03/19 0836  AST 28 46*  ALT 20 31  ALKPHOS 96 83  BILITOT 2.0* 2.2*  PROT 6.3* 6.0*  ALBUMIN 3.4* 3.1*   No results for input(s): LIPASE, AMYLASE in the last 168 hours. No results for input(s): AMMONIA in the last 168  hours. Coagulation Profile: Recent Labs  Lab 09/02/19 1823 09/06/19 0440  INR 4.3* 3.1*    CBG: Recent Labs  Lab 09/06/19 1341 09/06/19 1734 09/06/19 2134 09/07/19 0626 09/07/19 1125  GLUCAP 112* 142* 197* 113* 164*    Recent Results (from the past 240 hour(s))  SARS CORONAVIRUS 2 (TAT 6-24 HRS) Nasopharyngeal Nasopharyngeal Swab     Status: None   Collection Time: 09/03/19  1:59 AM   Specimen: Nasopharyngeal Swab  Result Value Ref Range Status   SARS Coronavirus 2 NEGATIVE NEGATIVE Final    Comment: (NOTE) SARS-CoV-2 target nucleic acids are NOT DETECTED. The SARS-CoV-2 RNA is generally detectable in upper and lower respiratory specimens during the acute phase of infection. Negative results do not preclude SARS-CoV-2 infection, do not rule out co-infections with other pathogens, and should not be used as the sole basis for treatment or other patient management decisions. Negative results must be combined with clinical observations, patient history, and epidemiological information. The expected result is  Negative. Fact Sheet for Patients: SugarRoll.be Fact Sheet for Healthcare Providers: https://www.woods-mathews.com/ This test is not yet approved or cleared by the [place] FDA and  has been authorized for detection and/or diagnosis of SARS-CoV-2 by FDA under an Emergency Use Authorization (EUA). This EUA will remain  in effect (meaning this test can be used) for the duration of the COVID-19 declaration under Section 56 4(b)(1) of the Act, 21 U.S.C. section 360bbb-3(b)(1), unless the authorization is terminated or revoked sooner. Performed at [facility], [place] [address], [place], [place] [address]   MRSA PCR Screening     Status: None   Collection Time: 09/03/19  6:34 PM   Specimen: Nasal Mucosa; Nasopharyngeal  Result Value Ref Range Status   MRSA by PCR NEGATIVE NEGATIVE Final    Comment:        The  GeneXpert MRSA Assay (FDA approved for NASAL specimens only), is one component of a comprehensive MRSA colonization surveillance program. It is not intended to diagnose MRSA infection nor to guide or monitor treatment for MRSA infections. Performed at [facility], [place] [address] [place], [place] [address]          Radiology Studies: No results found.      Scheduled Meds: . amiodarone  400 mg Oral BID  . apixaban  2.5 mg Oral BID  . Chlorhexidine Gluconate Cloth  6 each Topical Daily  . ciprofloxacin  250 mg Oral BID  . diclofenac Sodium  4 g Topical QID  . digoxin  0.0625 mg Oral Daily  . donepezil  5 mg Oral QHS  . ezetimibe  10 mg Oral Daily  . feeding supplement (ENSURE ENLIVE)  237 mL Oral BID BM  . insulin aspart  0-6 Units Subcutaneous TID WC  . latanoprost  1 drop Both Eyes QHS  . midodrine  10 mg Oral TID WC  . multivitamin with minerals  1 tablet Oral Daily  . predniSONE  40 mg Oral Q breakfast  . rosuvastatin  10 mg Oral QHS  . sodium chloride flush  10-40 mL Intracatheter Q12H  . tamsulosin  0.4 mg Oral QHS  . torsemide  40 mg Oral Daily   Continuous Infusions:    LOS: 5 days   Time spent= 25 mins    Benito Mccreedy, MD [facility]  If 7PM-7AM, please contact night-coverage  09/07/2019, 4:04 PM

## 2019-09-07 NOTE — Progress Notes (Signed)
   Palliative Medicine Inpatient Follow Up Note   HPI: Per hospitalist note --> 86 year old with history of CAD status post CABG, systolic CHF EF 99% status post ICD, chronic A. fib, right-sided no carcinoma of colon status post partial resection, iron deficiency anemia, CKD stage IIIa, DM2 presented with worsening shortness of breath and peripheral edema. Found to be in CHF exacerbation, started on diuretics and admitted to the hospital. CHF team following, PICC line placed.   Palliative care was asked to address goals of care in the setting of end stage biventricular heart failure.  Today's Discussion (09/07/2019): Chart reviewed. {atient underwent cardioversion yesterday. This morning he is sitting up in the chair on RA. He report feeling well. I asked him if there is anything that I may do to help him today and he answered no. Per his wife, Lelon Frohlich the plan is for him to go home with [facility] enrollment.  Discussed with patient the importance of continued conversation with family and their  medical providers regarding overall plan of care and treatment options, ensuring decisions are within the context of the patients values and GOCs.  Questions and concerns addressed   Vital Signs Vitals:   09/07/19 0720 09/07/19 1128  BP: (!) 98/48 (!) 89/45  Pulse: 78 (!) 49  Resp: (!) 23 19  Temp: 97.9 F (36.6 C) (!) 97.3 F (36.3 C)  SpO2: 94% 96%    Intake/Output Summary (Last 24 hours) at 09/07/2019 1131 Last data filed at 09/07/2019 0800 Gross per 24 hour  Intake 486.44 ml  Output 350 ml  Net 136.44 ml   Last Weight  Most recent update: 09/07/2019  4:25 AM   Weight  92.1 kg (203 lb 0.7 oz)           Physical Exam Vitals and nursing note reviewed.  HENT:     Head: Normocephalic.     Nose: Nose normal.     Mouth/Throat:     Mouth: Mucous membranes are dry.  Eyes:     Pupils: Pupils are equal, round, and reactive to light.  Cardiovascular:     Rate and Rhythm: Rhythm  irregular.     Pulses: Normal pulses.  Pulmonary:     Effort: Pulmonary effort is normal.     Comments:On RA Abdominal:     Palpations: Abdomen is soft.  Musculoskeletal:     Cervical back: Normal range of motion.     Comments: R knee stiff/creptitus  Skin:    Capillary Refill: Capillary refill takes less than 2 seconds.     Findings: Bruising present.  Neurological:     Mental Status: He is alert and oriented to person, place, and time.  Psychiatric:        Mood and Affect: Mood normal.   SUMMARY OF RECOMMENDATIONS   DNR/DNI  Home w/ [facility] hospice as they have the ability to provide milrinone  Chaplain support  Time Spent: 15 Greater than 50% of the time was spent in counseling and coordination of care ______________________________________________________________________________________ [place] Team Team Cell Phone: [phone] Please utilize secure chat with additional questions, if there is no response within 30 minutes please call the above phone number  Palliative Medicine Team providers are available by phone from 7am to 7pm daily and can be reached through the team cell phone.  Should this patient require assistance outside of these hours, please call the patient's attending physician.

## 2019-09-08 DIAGNOSIS — I5023 Acute on chronic systolic (congestive) heart failure: Secondary | ICD-10-CM | POA: Diagnosis not present

## 2019-09-08 LAB — BASIC METABOLIC PANEL
Anion gap: 10 (ref 5–15)
BUN: 49 mg/dL — ABNORMAL HIGH (ref 8–23)
CO2: 30 mmol/L (ref 22–32)
Calcium: 9.5 mg/dL (ref 8.9–10.3)
Chloride: 98 mmol/L (ref 98–111)
Creatinine, Ser: 1.72 mg/dL — ABNORMAL HIGH (ref 0.61–1.24)
GFR calc Af Amer: 41 mL/min — ABNORMAL LOW (ref >60)
GFR calc non Af Amer: 35 mL/min — ABNORMAL LOW (ref >60)
Glucose, Bld: 197 mg/dL — ABNORMAL HIGH (ref 70–99)
Potassium: 4.7 mmol/L (ref 3.5–5.1)
Sodium: 138 mmol/L (ref 135–145)

## 2019-09-08 LAB — COOXEMETRY PANEL
Carboxyhemoglobin: 1.6 % — ABNORMAL HIGH (ref 0.5–1.5)
Methemoglobin: 0.6 % (ref 0.0–1.5)
O2 Saturation: 64.6 %
Total hemoglobin: 11.5 g/dL — ABNORMAL LOW (ref 12.0–16.0)

## 2019-09-08 LAB — CBC
HCT: 35.7 % — ABNORMAL LOW (ref 39.0–52.0)
Hemoglobin: 11.4 g/dL — ABNORMAL LOW (ref 13.0–17.0)
MCH: 31.5 pg (ref 26.0–34.0)
MCHC: 31.9 g/dL (ref 30.0–36.0)
MCV: 98.6 fL (ref 80.0–100.0)
Platelets: 106 10*3/uL — ABNORMAL LOW (ref 150–400)
RBC: 3.62 MIL/uL — ABNORMAL LOW (ref 4.22–5.81)
RDW: 18.9 % — ABNORMAL HIGH (ref 11.5–15.5)
WBC: 2.4 10*3/uL — ABNORMAL LOW (ref 4.0–10.5)
nRBC: 0 % (ref 0.0–0.2)

## 2019-09-08 LAB — GLUCOSE, CAPILLARY
Glucose-Capillary: 182 mg/dL — ABNORMAL HIGH (ref 70–99)
Glucose-Capillary: 194 mg/dL — ABNORMAL HIGH (ref 70–99)

## 2019-09-08 LAB — MAGNESIUM: Magnesium: 2.4 mg/dL (ref 1.7–2.4)

## 2019-09-08 MED ORDER — BIOTENE DRY MOUTH MT LIQD: 15.0000 mL | OROMUCOSAL | 0 refills | Status: AC | PRN

## 2019-09-08 MED ORDER — AMIODARONE HCL 400 MG PO TABS: ORAL_TABLET | ORAL | 0 refills | Status: DC

## 2019-09-08 MED ORDER — PREDNISONE 20 MG PO TABS: 40.0000 mg | ORAL_TABLET | Freq: Every day | ORAL | 0 refills | Status: DC

## 2019-09-08 MED ORDER — TORSEMIDE 20 MG PO TABS
40.0000 mg | ORAL_TABLET | Freq: Two times a day (BID) | ORAL | Status: DC
  Administered 2019-09-08: 11:00:00 40 mg via ORAL

## 2019-09-08 MED ORDER — TORSEMIDE 20 MG PO TABS: 40.0000 mg | ORAL_TABLET | Freq: Two times a day (BID) | ORAL | 0 refills | Status: DC

## 2019-09-08 MED ORDER — DIGOXIN 125 MCG PO TABS: 0.0625 mg | ORAL_TABLET | ORAL | Status: DC

## 2019-09-08 MED ORDER — APIXABAN 2.5 MG PO TABS: 2.5000 mg | ORAL_TABLET | Freq: Two times a day (BID) | ORAL | 0 refills | Status: DC

## 2019-09-08 MED ORDER — DICLOFENAC SODIUM 1 % EX GEL: 4.0000 g | Freq: Four times a day (QID) | CUTANEOUS | 1 refills | Status: AC

## 2019-09-08 MED ORDER — AMIODARONE HCL 200 MG PO TABS: ORAL_TABLET | ORAL | 0 refills | Status: DC

## 2019-09-08 MED ORDER — DIGOXIN 62.5 MCG PO TABS: 0.0625 mg | ORAL_TABLET | ORAL | 0 refills | Status: DC

## 2019-09-08 MED ORDER — INSULIN LISPRO 100 UNIT/ML ~~LOC~~ SOLN: 1.0000 [IU] | Freq: Three times a day (TID) | SUBCUTANEOUS | 11 refills | Status: DC

## 2019-09-08 MED ORDER — MIDODRINE HCL 10 MG PO TABS: 10.0000 mg | ORAL_TABLET | Freq: Three times a day (TID) | ORAL | 0 refills | Status: DC

## 2019-09-08 MED ORDER — CIPROFLOXACIN HCL 250 MG PO TABS: 250.0000 mg | ORAL_TABLET | Freq: Two times a day (BID) | ORAL | 0 refills | Status: AC

## 2019-09-08 MED ORDER — ONDANSETRON HCL 4 MG PO TABS: 4.0000 mg | ORAL_TABLET | Freq: Four times a day (QID) | ORAL | 0 refills | Status: AC | PRN

## 2019-09-08 MED ORDER — ENSURE ENLIVE PO LIQD: 237.0000 mL | Freq: Two times a day (BID) | ORAL | 12 refills | Status: AC

## 2019-09-08 NOTE — Care Management (Addendum)
08:35 Confirmed with [facility] that all equipment has been delivered to the house.  Anticipate DC this morning with first home visit later this morning.  MD- Please indicate  HOME HOSPICE on DC Summary, and verify Gold DNR on chart.   09:00 GOLD DNR on chart  10:00 Notified Dr Emilee Hero- Bonsu that patient is ready for DC and would like an early DC

## 2019-09-08 NOTE — Care Management (Signed)
[facility] called for pick up at 3pm. Hospice will schedule to see at home between 5pm and 6pm.

## 2019-09-08 NOTE — Progress Notes (Signed)
Patient's picc line remvd by picc line IV nurse---no complications---patient needs to remain flat for one hour, advised debbie/cse mgr can arrange [facility] transport for anytime 1500 or later today---patient discharge to home with hospice

## 2019-09-08 NOTE — Progress Notes (Signed)
Patient ID: Luis Palmer, male   DOB: [birth year], 86 y.o.   MRN: [id number]     Advanced Heart Failure Rounding Note  PCP-Cardiologist: No primary care provider on file.   Subjective:    He is off milrinone today, co-ox 65% this morning.  He is on midodrine 10 mg tid with SBP in 90s generally.  No lightheadedness.    CVP 12-13 this morning on po torsemide.  Weight has trended down.   He remains in NSR on amiodarone.   Right knee painful but improved.  On prednisone burst for gout.   Objective:   Weight Range: 93.4 kg Body mass index is 27.93 kg/m.   Vital Signs:   Temp:  [97.3 F (36.3 C)-98.5 F (36.9 C)] 97.5 F (36.4 C) (04/11 0800) Pulse Rate:  [49-81] 69 (04/11 0900) Resp:  [15-31] 15 (04/11 0900) BP: (88-108)/(45-67) 99/58 (04/11 0900) SpO2:  [96 %-100 %] 97 % (04/11 0900) Weight:  [93.4 kg] 93.4 kg (04/11 0329) Last BM Date: 09/06/19  Weight change: Filed Weights   09/06/19 1137 09/07/19 0417 09/08/19 0329  Weight: 93.9 kg 92.1 kg 93.4 kg    Intake/Output:   Intake/Output Summary (Last 24 hours) at 09/08/2019 0932 Last data filed at 09/08/2019 0800 Gross per 24 hour  Intake 845 ml  Output 875 ml  Net -30 ml      Physical Exam   CVP 12-13   General: NAD Neck: JVP 10-12 cm, no thyromegaly or thyroid nodule.  Lungs: Clear to auscultation bilaterally with normal respiratory effort. CV: Nondisplaced PMI.  Heart regular S1/S2, no S3/S4, no murmur.  1+ edema 3/4 to knees bilaterally.   Abdomen: Soft, nontender, no hepatosplenomegaly, no distention.  Skin: Intact without lesions or rashes.  Neurologic: Alert and oriented x 3.  Psych: Normal affect. Extremities: No clubbing or cyanosis.  HEENT: Normal.    Telemetry   NSR with BiV pacing.  Personally reviewed.   Labs    CBC Recent Labs    09/07/19 0407 09/08/19 0352  WBC 5.0 2.4*  HGB 10.7* 11.4*  HCT 33.8* 35.7*  MCV 98.5 98.6  PLT 103* 177*   Basic Metabolic Panel Recent Labs   09/07/19 0407 09/08/19 0352  NA 139 138  K 4.3 4.7  CL 99 98  CO2 29 30  GLUCOSE 114* 197*  BUN 45* 49*  CREATININE 1.69* 1.72*  CALCIUM 9.0 9.5  MG 2.4 2.4   Liver Function Tests No results for input(s): AST, ALT, ALKPHOS, BILITOT, PROT, ALBUMIN in the last 72 hours. No results for input(s): LIPASE, AMYLASE in the last 72 hours. Cardiac Enzymes No results for input(s): CKTOTAL, CKMB, CKMBINDEX, TROPONINI in the last 72 hours.  BNP: BNP (last 3 results) Recent Labs    09/02/19 1900  BNP >4,500.0*    ProBNP (last 3 results) No results for input(s): PROBNP in the last 8760 hours.   D-Dimer No results for input(s): DDIMER in the last 72 hours. Hemoglobin A1C No results for input(s): HGBA1C in the last 72 hours. Fasting Lipid Panel No results for input(s): CHOL, HDL, LDLCALC, TRIG, CHOLHDL, LDLDIRECT in the last 72 hours. Thyroid Function Tests No results for input(s): TSH, T4TOTAL, T3FREE, THYROIDAB in the last 72 hours.  Invalid input(s): FREET3  Other results:   Imaging    No results found.   Medications:     Scheduled Medications: . amiodarone  400 mg Oral BID  . apixaban  2.5 mg Oral BID  . Chlorhexidine Gluconate Cloth  6 each Topical Daily  . ciprofloxacin  250 mg Oral BID  . diclofenac Sodium  4 g Topical QID  . [START ON 09/09/2019] digoxin  0.0625 mg Oral QODAY  . donepezil  5 mg Oral QHS  . ezetimibe  10 mg Oral Daily  . feeding supplement (ENSURE ENLIVE)  237 mL Oral BID BM  . insulin aspart  0-6 Units Subcutaneous TID WC  . latanoprost  1 drop Both Eyes QHS  . midodrine  10 mg Oral TID WC  . multivitamin with minerals  1 tablet Oral Daily  . predniSONE  40 mg Oral Q breakfast  . rosuvastatin  10 mg Oral QHS  . sodium chloride flush  10-40 mL Intracatheter Q12H  . tamsulosin  0.4 mg Oral QHS  . torsemide  40 mg Oral BID    Infusions:   PRN Medications: antiseptic oral rinse, ondansetron **OR** ondansetron (ZOFRAN) IV, sodium  chloride flush   Assessment/Plan   1. Acute on chronic systolic CHF: Suspect primarily ischemic cardiomyopathy but has now developed RV failure in setting of atrial fibrillation and elevated PA pressure. Echo in 4/21 with EF 15-20%, moderate LV dilation, mildly dilated RV with severely decreased systolic function, moderate-severe TR, moderate-severe MR.  Device interrogation showed suboptimal BiV pacing (84%) in setting of persistent atrial fibrillation.  TEE-guided DCCV on 4/9 to NSR.  TEE showed EF 20%, mild-moderate RV dysfunction, mild TR, mild-moderate MR. Milrinone now off, on midodrine to maintain BP (SBP in 90s today).  Co-ox 65% with CVP 12-13 today.  - Creatinine stable, increase torsemide to 40 mg bid for home.  - Can continue low dose digoxin, will use 0.0625 mg every other day, will follow level as outpatient and might be able to increase if creatinine remains stable.   - Continue midodrine 10 mg bid.   - With age and dementia, not candidate for advanced therapies.   2. Atrial fibrillation: Persistent.  He has had 2 atrial fibrillation ablations in the past, 5/10 and 2/12.  He was on sotalol in the past as well.  He appears to have gone into atrial fibrillation (by device interrogation) around 5/20.  No attempt was made to get him back into NSR after that.  His BiV pacing percentage had dropped.  TEE-guided DCCV on 4/9.  He remains in NSR.  - Continue amiodarone 400 mg bid.  - Continue Eliquis 2.5 mg bid.   3. AKI on CKD stage 3: Creatinine baseline appears to be around 1.4, increased to 2.  Creatinine 1.72 today, stable.  4. CAD: S/p CABG.  No chest pain.  No recent coronary angiography.  No evidence for ACS this admission.  - Continue Crestor and Zetia.  - On anticoagulation, no ASA.  5. Venous stasis ulcers: Wound care following, unna boots.  6. Valvular heart disease: Moderate-severe MR and TR on TTE, but only mild TR and mild-moderate MR noted on TEE.  7. Dementia: on Aricept.    8. L knee pain: Uric acid elevated 9.7, suspect gout flare.  - Will give prednisone 40 mg daily x 3 days.   9. Disposition: Home with hospice today.  Followup with me in 10 days.  Meds for home: digoxin 0.0625 mg qod, midodrine 10 mg tid, torsemide 40 mg bid, prednisone 40 mg for 1 more day for gout, Crestor 10 mg daily, amiodarone 400 mg bid x 4 days then 200 mg bid x 7 days then 200 mg daily after that, apixaban 2.5 mg bid, Zetia 10  mg daily.   Length of Stay: 6  [name], MD  09/08/2019, 9:32 AM  Advanced Heart Failure Team Pager [phone] (M-F; 7a - 4p)  Please contact [facility] for night-coverage after hours (4p -7a ) and weekends on [email or web address]

## 2019-09-12 ENCOUNTER — Ambulatory Visit (INDEPENDENT_AMBULATORY_CARE_PROVIDER_SITE_OTHER): Payer: Medicare Other | Admitting: Gastroenterology

## 2019-09-12 ENCOUNTER — Other Ambulatory Visit: Payer: Self-pay

## 2019-09-12 ENCOUNTER — Encounter: Payer: Self-pay | Admitting: Gastroenterology

## 2019-09-12 VITALS — BP 86/53 | HR 81 | Ht 72.0 in | Wt 197.4 lb

## 2019-09-12 DIAGNOSIS — D500 Iron deficiency anemia secondary to blood loss (chronic): Secondary | ICD-10-CM

## 2019-09-12 NOTE — Progress Notes (Signed)
Primary Care Physician: Idelle Crouch, MD  Primary Gastroenterologist:  Dr. Lucilla Lame  Chief Complaint  Patient presents with  . iron deficiency anemia    HPI: Luis Palmer is a 86 y.o. male here with a history of colon cancer.  The patient had a colonoscopy by me in August 2019 and was found to have a partially obstructing mass.  The patient then underwent a colonic resection.  The patient was recently in the hospital for congestive heart failure and was found to have anemia.  The patient's last iron studies in February showed a low iron saturation.  His most recent hemoglobin was 11.4 with a normal MCV. The patient has had worsening health issues over the last few years.  His hemoglobin has been stable with iron supplementation.  The patient is mostly concerned that he may have recurrence of his colon cancer.  It appears from the pathology that the cancer was removed with good margins.  He denies any abdominal pain or rectal bleeding.  Past Medical History:  Diagnosis Date  . AICD (automatic cardioverter/defibrillator) present 2006  . Anemia   . Atrial fibrillation ([place])   . CHF (congestive heart failure) ([place])   . CKD (chronic kidney disease), stage III   . Colon cancer ([place])    "dx'd 01/24/2018"  . High cholesterol   . History of blood transfusion 01/22/2018   "I lost blood; HgB extremely low"  . History of gout   . Myocardial infarction ([place]) 1986  . OSA on CPAP   . Pneumonia 2015  . Type II diabetes mellitus ([place])     Current Outpatient Medications  Medication Sig Dispense Refill  . acetaminophen (TYLENOL) 500 MG tablet Take 2 tablets (1,000 mg total) by mouth every 8 (eight) hours. (Patient taking differently: Take 1,000 mg by mouth daily as needed for headache (pain). ) 30 tablet 0  . amiodarone (PACERONE) 200 MG tablet Take 2 tablets (400 mg total) by mouth 2 (two) times daily for 4 days, THEN 1 tablet (200 mg total) 2 (two) times daily for 7 days, THEN 1  tablet (200 mg total) every morning for 19 days. 49 tablet 0  . antiseptic oral rinse (BIOTENE) LIQD 15 mLs by Mouth Rinse route as needed for dry mouth. 450 mL 0  . apixaban (ELIQUIS) 2.5 MG TABS tablet Take 1 tablet (2.5 mg total) by mouth 2 (two) times daily. 60 tablet 0  . ciprofloxacin (CIPRO) 250 MG tablet Take 1 tablet (250 mg total) by mouth 2 (two) times daily. 14 tablet 0  . diclofenac Sodium (VOLTAREN) 1 % GEL Apply 4 g topically 4 (four) times daily. 2300 g 1  . digoxin 62.5 MCG TABS Take 0.0625 mg by mouth every other day. 15 tablet 0  . donepezil (ARICEPT) 5 MG tablet Take 5 mg by mouth at bedtime.     Marland Kitchen ezetimibe (ZETIA) 10 MG tablet Take 10 mg by mouth daily.     . feeding supplement, ENSURE ENLIVE, (ENSURE ENLIVE) LIQD Take 237 mLs by mouth 2 (two) times daily between meals. 237 mL 12  . guaiFENesin (MUCINEX PO) Take 1 tablet by mouth 2 (two) times daily.    . hydrocortisone 2.5 % cream Apply 1 application topically daily as needed (rash/irritation).     Marland Kitchen latanoprost (XALATAN) 0.005 % ophthalmic solution Place 1 drop into both eyes at bedtime.     . midodrine (PROAMATINE) 10 MG tablet Take 1 tablet (10 mg total) by mouth  3 (three) times daily with meals. 30 tablet 0  . Multiple Vitamin (MULTIVITAMIN WITH MINERALS) TABS tablet Take 1 tablet by mouth daily. 30 tablet 0  . Multiple Vitamins-Minerals (ZINC PO) Take 1 tablet by mouth daily.    . ondansetron (ZOFRAN) 4 MG tablet Take 1 tablet (4 mg total) by mouth every 6 (six) hours as needed for nausea. 20 tablet 0  . predniSONE (DELTASONE) 20 MG tablet Take 2 tablets (40 mg total) by mouth daily with breakfast. 1 tablet 0  . rosuvastatin (CRESTOR) 10 MG tablet Take 10 mg by mouth at bedtime.     . sodium chloride (OCEAN) 0.65 % nasal spray Place 1 spray into the nose as needed for congestion.     . tamsulosin (FLOMAX) 0.4 MG CAPS capsule Take 1 capsule (0.4 mg total) by mouth at bedtime. 30 capsule 0  . torsemide (DEMADEX) 20 MG  tablet Take 2 tablets (40 mg total) by mouth 2 (two) times daily. 60 tablet 0  . VITAMIN D PO Take 1 capsule by mouth daily.     . insulin lispro (HUMALOG) 100 UNIT/ML injection Inject 0.01-0.05 mLs (1-5 Units total) into the skin 3 (three) times daily with meals. (Patient not taking: Reported on 09/12/2019) 10 mL 11   No current facility-administered medications for this visit.    Allergies as of 09/12/2019 - Review Complete 09/12/2019  Allergen Reaction Noted  . Pseudoephedrine hcl Other (See Comments) 10/02/2017  . Ace inhibitors Other (See Comments) and Cough 11/21/2011  . Nsaids Other (See Comments) 12/29/2014  . Vasotec [enalapril] Cough 12/29/2014    ROS:  General: Negative for anorexia, weight loss, fever, chills, fatigue, weakness. ENT: Negative for hoarseness, difficulty swallowing , nasal congestion. CV: Negative for chest pain, angina, palpitations, dyspnea on exertion, peripheral edema.  Respiratory: Negative for dyspnea at rest, dyspnea on exertion, cough, sputum, wheezing.  GI: See history of present illness. GU:  Negative for dysuria, hematuria, urinary incontinence, urinary frequency, nocturnal urination.  Endo: Negative for unusual weight change.    Physical Examination:   BP (!) 86/53   Pulse 81   Ht 6' (1.829 m)   Wt 197 lb 6.4 oz (89.5 kg)   BMI 26.77 kg/m   General: Well-nourished, well-developed in no acute distress.  Eyes: No icterus. Conjunctivae pink. Lungs: Clear to auscultation bilaterally. Non-labored. Heart: Regular rate and rhythm, no murmurs rubs or gallops.  Abdomen: Bowel sounds are normal, nontender, nondistended, no hepatosplenomegaly or masses, no abdominal bruits or hernia , no rebound or guarding.   Extremities: Positive lower extremity edema. No clubbing or deformities. Neuro: Alert and oriented x 3.  Grossly intact. Skin: Warm and dry, no jaundice.   Psych: Alert and cooperative, normal mood and affect.  Labs:    Imaging  Studies: DG Chest Portable 1 View  Result Date: 09/02/2019 CLINICAL DATA:  Shortness of breath EXAM: PORTABLE CHEST 1 VIEW COMPARISON:  01/31/2018 FINDINGS: Left AICD remains in place, unchanged. Cardiomegaly, prior CABG. Small right pleural effusion. Bibasilar opacities, likely atelectasis. Mild vascular congestion. No overt edema. IMPRESSION: Cardiomegaly, vascular congestion. Small right pleural effusion.  Bibasilar atelectasis. Electronically Signed   By: Rolm Baptise M.D.   On: 09/02/2019 19:08   ECHOCARDIOGRAM COMPLETE  Result Date: 09/03/2019    ECHOCARDIOGRAM REPORT   Patient Name:   Luis Palmer Date of Exam: 09/03/2019 Medical Rec #:  [id number]          Height:       72.0 in Accession #:  [id number]         Weight:       206.0 lb Date of Birth:  June 21, 1932          BSA:          2.157 m Patient Age:    53 years           BP:           105/70 mmHg Patient Gender: M                  HR:           91 bpm. Exam Location:  Inpatient Procedure: 2D Echo Indications:    CHF-Acute Systolic X90.24  History:        Patient has prior history of Echocardiogram examinations, most                 recent 01/24/2018. CAD; Prior CABG.  Sonographer:    Mikki Santee RDCS (AE) Referring Phys: [id number] [place]  1. There is no left ventricular thrombus. Left ventricular ejection fraction, by estimation, is 15-20%. The left ventricle has severely decreased function. The left ventricle demonstrates global hypokinesis. The left ventricular internal cavity size was  moderately dilated. Left ventricular diastolic function could not be evaluated.  2. Right ventricular systolic function is severely reduced. The right ventricular size is mildly enlarged. There is moderately elevated pulmonary artery systolic pressure.  3. Left atrial size was severely dilated.  4. Right atrial size was severely dilated.  5. Large pleural effusion in the left lateral region.  6. The mitral valve is normal in structure.  Moderate to severe mitral valve regurgitation.  7. Tricuspid valve regurgitation is moderate to severe.  8. The aortic valve is normal in structure. Aortic valve regurgitation is not visualized. FINDINGS  Left Ventricle: There is no left ventricular thrombus. Left ventricular ejection fraction, by estimation, is 15-20%. The left ventricle has severely decreased function. The left ventricle demonstrates global hypokinesis. Definity contrast agent was given IV to delineate the left ventricular endocardial borders. The left ventricular internal cavity size was moderately dilated. There is no left ventricular hypertrophy. Abnormal (paradoxical) septal motion, consistent with left bundle branch block. Left ventricular diastolic function could not be evaluated due to atrial fibrillation. Left ventricular diastolic function could not be evaluated. Right Ventricle: The right ventricular size is mildly enlarged. No increase in right ventricular wall thickness. Right ventricular systolic function is severely reduced. There is moderately elevated pulmonary artery systolic pressure. The tricuspid regurgitant velocity is 3.21 m/s, and with an assumed right atrial pressure of 15 mmHg, the estimated right ventricular systolic pressure is 99.2 mmHg. Left Atrium: Left atrial size was severely dilated. Right Atrium: Right atrial size was severely dilated. Pericardium: There is no evidence of pericardial effusion. Mitral Valve: The mitral valve is normal in structure. Moderate to severe mitral valve regurgitation, with centrally-directed jet. Tricuspid Valve: The tricuspid valve is normal in structure. Tricuspid valve regurgitation is moderate to severe. Aortic Valve: The aortic valve is normal in structure. Aortic valve regurgitation is not visualized. Pulmonic Valve: The pulmonic valve was normal in structure. Pulmonic valve regurgitation is mild. Aorta: The aortic root and ascending aorta are structurally normal, with no evidence  of dilitation. IAS/Shunts: No atrial level shunt detected by color flow Doppler. Additional Comments: A pacer wire is visualized. There is a large pleural effusion in the left lateral region.  LEFT VENTRICLE PLAX 2D LVIDd:  6.60 cm LVIDs:         6.00 cm LV PW:         1.00 cm LV IVS:        1.00 cm LVOT diam:     2.30 cm LV SV:         18 LV SV Index:   8 LVOT Area:     4.15 cm  RIGHT VENTRICLE RV S prime:     5.63 cm/s TAPSE (M-mode): 1.2 cm LEFT ATRIUM              Index       RIGHT ATRIUM           Index LA diam:        5.40 cm  2.50 cm/m  RA Area:     32.00 cm LA Vol (A2C):   115.0 ml 53.30 ml/m RA Volume:   127.00 ml 58.86 ml/m LA Vol (A4C):   122.0 ml 56.55 ml/m LA Biplane Vol: 122.0 ml 56.55 ml/m  AORTIC VALVE LVOT Vmax:   32.93 cm/s LVOT Vmean:  22.633 cm/s LVOT VTI:    0.043 m  AORTA Ao Root diam: 3.10 cm Ao Asc diam:  3.70 cm MR Peak grad: 58.1 mmHg   TRICUSPID VALVE MR Mean grad: 35.7 mmHg   TR Peak grad:   41.2 mmHg MR Vmax:      381.00 cm/s TR Vmax:        321.00 cm/s MR Vmean:     278.7 cm/s                           SHUNTS                           Systemic VTI:  0.04 m                           Systemic Diam: 2.30 cm Dani Gobble Croitoru MD Electronically signed by Sanda Klein MD Signature Date/Time: 09/03/2019/5:10:50 PM    Final    VAS [place] LOWER EXTREMITY VENOUS (DVT)  Result Date: 09/05/2019  Lower Venous DVTStudy Indications: Pain, and Swelling.  Limitations: Bilateral Unna boots. Comparison Study: No prior study Performing Technologist: Maudry Mayhew MHA, RDMS, RVT, RDCS  Examination Guidelines: A complete evaluation includes B-mode imaging, spectral Doppler, color Doppler, and power Doppler as needed of all accessible portions of each vessel. Bilateral testing is considered an integral part of a complete examination. Limited examinations for reoccurring indications may be performed as noted. The reflux portion of the exam is performed with the patient in reverse Trendelenburg.   +---------+---------------+---------+-----------+----------+--------------+ RIGHT    CompressibilityPhasicitySpontaneityPropertiesThrombus Aging +---------+---------------+---------+-----------+----------+--------------+ CFV      Full           No       Yes                                 +---------+---------------+---------+-----------+----------+--------------+ SFJ      Full                                                        +---------+---------------+---------+-----------+----------+--------------+ FV Prox  Full                                                        +---------+---------------+---------+-----------+----------+--------------+  FV Mid   Full                                                        +---------+---------------+---------+-----------+----------+--------------+ FV DistalFull                                                        +---------+---------------+---------+-----------+----------+--------------+ PFV      Full                                                        +---------+---------------+---------+-----------+----------+--------------+ POP      Full           No       Yes                                 +---------+---------------+---------+-----------+----------+--------------+   Right Technical Findings: Not visualized segments include PTV, peroneal veins.  +---------+---------------+---------+-----------+----------+--------------+ LEFT     CompressibilityPhasicitySpontaneityPropertiesThrombus Aging +---------+---------------+---------+-----------+----------+--------------+ CFV      Full           No       Yes                                 +---------+---------------+---------+-----------+----------+--------------+ SFJ      Full                                                        +---------+---------------+---------+-----------+----------+--------------+ FV Prox  Full                                                         +---------+---------------+---------+-----------+----------+--------------+ FV Mid   Full                                                        +---------+---------------+---------+-----------+----------+--------------+ FV DistalFull                                                        +---------+---------------+---------+-----------+----------+--------------+ PFV      Full                                                        +---------+---------------+---------+-----------+----------+--------------+  POP      Full           No       Yes                                 +---------+---------------+---------+-----------+----------+--------------+   Left Technical Findings: Not visualized segments include PTV, peroneal veins.   Summary: RIGHT: - There is no evidence of deep vein thrombosis in the lower extremity. However, portions of this examination were limited- see technologist comments above.  - No cystic structure found in the popliteal fossa.  LEFT: - There is no evidence of deep vein thrombosis in the lower extremity. However, portions of this examination were limited- see technologist comments above.  - No cystic structure found in the popliteal fossa.  *See table(s) above for measurements and observations. Electronically signed by Curt Jews MD on 09/05/2019 at 3:12:20 PM.    Final    [place] EKG SITE RITE  Result Date: 09/03/2019 If Site Rite image not attached, placement could not be confirmed due to current cardiac rhythm.   Assessment and Plan:   Luis Palmer is a 86 y.o. y/o male who comes in with a history of colon cancer with a resection within the last 2 years.  The patient has a hemoglobin that has remained above 11 during the most recent hospital stay.  The patient has been told the risks of a repeat GI work-up including the risks associated with his worsening cardiac status.  I have told him that if his hemoglobin is staying  stable and he is doing well on iron infusions that he may want to continue that.  The patient and his wife are reluctant to undergo any invasive procedures in his present medical condition and I do not disagree with them.  They have also been told that if things get worse and his blood count starts to not respond to the iron then we can readdress our plan.  The patient and his wife have been explained the plan and agree with it.     Lucilla Lame, MD. Marval Regal    Note: This dictation was prepared with Dragon dictation along with smaller phrase technology. Any transcriptional errors that result from this process are unintentional.

## 2019-09-16 ENCOUNTER — Ambulatory Visit (INDEPENDENT_AMBULATORY_CARE_PROVIDER_SITE_OTHER): Payer: Medicare Other | Admitting: Dermatology

## 2019-09-16 ENCOUNTER — Other Ambulatory Visit: Payer: Self-pay

## 2019-09-16 DIAGNOSIS — I872 Venous insufficiency (chronic) (peripheral): Secondary | ICD-10-CM | POA: Diagnosis not present

## 2019-09-16 DIAGNOSIS — R600 Localized edema: Secondary | ICD-10-CM

## 2019-09-16 DIAGNOSIS — L98499 Non-pressure chronic ulcer of skin of other sites with unspecified severity: Secondary | ICD-10-CM

## 2019-09-16 DIAGNOSIS — R238 Other skin changes: Secondary | ICD-10-CM

## 2019-09-16 DIAGNOSIS — I509 Heart failure, unspecified: Secondary | ICD-10-CM

## 2019-09-16 DIAGNOSIS — B965 Pseudomonas (aeruginosa) (mallei) (pseudomallei) as the cause of diseases classified elsewhere: Secondary | ICD-10-CM

## 2019-09-16 NOTE — Progress Notes (Signed)
   Follow-Up Visit   Subjective  Luis Palmer is a 86 y.o. male who presents for the following: edema and ulcerations of the B/L lower leg (The patient missed his last appointment due to being admitted to the hospital with CHF. He was treated by the wound care clinic while in the hospital and is now being seen by hospice, but they are not treating the lower legs.). The patient is also now under hospice care for his congestive heart failure and he is doing very poorly with this as well as his legs worsening with edema and fluid.  The following portions of the chart were reviewed this encounter and updated as appropriate: Tobacco  Allergies  Meds  Problems  Med Hx  Surg Hx  Fam Hx     Review of Systems: No other skin or systemic complaints.  Objective  Well appearing patient in no apparent distress; mood and affect are within normal limits.  A focused examination was performed including the b/l lower leg. Relevant physical exam findings are noted in the Assessment and Plan.  Objective  B/L lower leg: Ulcers and blisters/bullae, severe pitting edema, crusting.  Images            Assessment & Plan  Venous stasis dermatitis of both lower extremities with bullae that are symptomatic. B/L lower leg  Complicated by CHF-congestive heart failure (patient currently in Hospice care) with multiple ulcerations, edema, and pseudomonas infection -exacerbated/flared since last seen I&D x 7 today of blisters/bulla on the L lower leg and foot. Debridement performed with Puracyn spray of the B/L lower legs. Mupirocin 2% ointment applied to ulcerations, and covered with non-stick gauze. Then an Unna boot was applied to each lower leg and wrapped in Coban. Continue Cipro 250mg  po QD. Will recheck at follow up appointment this coming Thursday.   Return in about 3 days (around 09/19/2019) for F/U appt.Luther Redo, CMA, am acting as scribe for Sarina Ser, MD .  Documentation:  I have reviewed the above documentation for accuracy and completeness, and I agree with the above.  Sarina Ser, MD

## 2019-09-17 ENCOUNTER — Encounter: Payer: Self-pay | Admitting: Dermatology

## 2019-09-19 ENCOUNTER — Ambulatory Visit (HOSPITAL_COMMUNITY)
Admission: RE | Admit: 2019-09-19 | Discharge: 2019-09-19 | Disposition: A | Discharge status: Home / Self Care | Source: Ambulatory Visit | Attending: Cardiology | Admitting: Cardiology

## 2019-09-19 ENCOUNTER — Encounter (HOSPITAL_COMMUNITY): Payer: Self-pay | Admitting: Cardiology

## 2019-09-19 ENCOUNTER — Other Ambulatory Visit: Payer: Self-pay

## 2019-09-19 ENCOUNTER — Ambulatory Visit (INDEPENDENT_AMBULATORY_CARE_PROVIDER_SITE_OTHER): Payer: Medicare Other | Admitting: Dermatology

## 2019-09-19 VITALS — BP 90/52 | HR 71 | Wt 176.8 lb

## 2019-09-19 DIAGNOSIS — B965 Pseudomonas (aeruginosa) (mallei) (pseudomallei) as the cause of diseases classified elsewhere: Secondary | ICD-10-CM

## 2019-09-19 DIAGNOSIS — Z951 Presence of aortocoronary bypass graft: Secondary | ICD-10-CM | POA: Insufficient documentation

## 2019-09-19 DIAGNOSIS — E1122 Type 2 diabetes mellitus with diabetic chronic kidney disease: Secondary | ICD-10-CM | POA: Diagnosis not present

## 2019-09-19 DIAGNOSIS — Z823 Family history of stroke: Secondary | ICD-10-CM | POA: Diagnosis not present

## 2019-09-19 DIAGNOSIS — Z95810 Presence of automatic (implantable) cardiac defibrillator: Secondary | ICD-10-CM | POA: Diagnosis not present

## 2019-09-19 DIAGNOSIS — I5022 Chronic systolic (congestive) heart failure: Secondary | ICD-10-CM | POA: Diagnosis present

## 2019-09-19 DIAGNOSIS — F0390 Unspecified dementia without behavioral disturbance: Secondary | ICD-10-CM | POA: Diagnosis not present

## 2019-09-19 DIAGNOSIS — I081 Rheumatic disorders of both mitral and tricuspid valves: Secondary | ICD-10-CM | POA: Insufficient documentation

## 2019-09-19 DIAGNOSIS — Z801 Family history of malignant neoplasm of trachea, bronchus and lung: Secondary | ICD-10-CM | POA: Diagnosis not present

## 2019-09-19 DIAGNOSIS — Z87891 Personal history of nicotine dependence: Secondary | ICD-10-CM | POA: Diagnosis not present

## 2019-09-19 DIAGNOSIS — I872 Venous insufficiency (chronic) (peripheral): Secondary | ICD-10-CM

## 2019-09-19 DIAGNOSIS — Z85038 Personal history of other malignant neoplasm of large intestine: Secondary | ICD-10-CM | POA: Diagnosis not present

## 2019-09-19 DIAGNOSIS — I509 Heart failure, unspecified: Secondary | ICD-10-CM

## 2019-09-19 DIAGNOSIS — I252 Old myocardial infarction: Secondary | ICD-10-CM | POA: Diagnosis not present

## 2019-09-19 DIAGNOSIS — L98499 Non-pressure chronic ulcer of skin of other sites with unspecified severity: Secondary | ICD-10-CM | POA: Diagnosis not present

## 2019-09-19 DIAGNOSIS — Z79899 Other long term (current) drug therapy: Secondary | ICD-10-CM | POA: Insufficient documentation

## 2019-09-19 DIAGNOSIS — N184 Chronic kidney disease, stage 4 (severe): Secondary | ICD-10-CM | POA: Diagnosis not present

## 2019-09-19 DIAGNOSIS — I255 Ischemic cardiomyopathy: Secondary | ICD-10-CM | POA: Diagnosis not present

## 2019-09-19 DIAGNOSIS — Z833 Family history of diabetes mellitus: Secondary | ICD-10-CM | POA: Insufficient documentation

## 2019-09-19 DIAGNOSIS — I480 Paroxysmal atrial fibrillation: Secondary | ICD-10-CM | POA: Insufficient documentation

## 2019-09-19 DIAGNOSIS — R609 Edema, unspecified: Secondary | ICD-10-CM

## 2019-09-19 DIAGNOSIS — E785 Hyperlipidemia, unspecified: Secondary | ICD-10-CM | POA: Insufficient documentation

## 2019-09-19 DIAGNOSIS — G4700 Insomnia, unspecified: Secondary | ICD-10-CM | POA: Diagnosis not present

## 2019-09-19 DIAGNOSIS — G4733 Obstructive sleep apnea (adult) (pediatric): Secondary | ICD-10-CM | POA: Insufficient documentation

## 2019-09-19 DIAGNOSIS — I2510 Atherosclerotic heart disease of native coronary artery without angina pectoris: Secondary | ICD-10-CM | POA: Diagnosis not present

## 2019-09-19 DIAGNOSIS — Z808 Family history of malignant neoplasm of other organs or systems: Secondary | ICD-10-CM | POA: Diagnosis not present

## 2019-09-19 DIAGNOSIS — Z8249 Family history of ischemic heart disease and other diseases of the circulatory system: Secondary | ICD-10-CM | POA: Insufficient documentation

## 2019-09-19 DIAGNOSIS — M109 Gout, unspecified: Secondary | ICD-10-CM | POA: Diagnosis not present

## 2019-09-19 DIAGNOSIS — Z7901 Long term (current) use of anticoagulants: Secondary | ICD-10-CM | POA: Diagnosis not present

## 2019-09-19 LAB — COMPREHENSIVE METABOLIC PANEL
ALT: 33 U/L (ref 0–44)
AST: 27 U/L (ref 15–41)
Albumin: 3.2 g/dL — ABNORMAL LOW (ref 3.5–5.0)
Alkaline Phosphatase: 105 U/L (ref 38–126)
Anion gap: 11 (ref 5–15)
BUN: 32 mg/dL — ABNORMAL HIGH (ref 8–23)
CO2: 36 mmol/L — ABNORMAL HIGH (ref 22–32)
Calcium: 9.4 mg/dL (ref 8.9–10.3)
Chloride: 93 mmol/L — ABNORMAL LOW (ref 98–111)
Creatinine, Ser: 1.77 mg/dL — ABNORMAL HIGH (ref 0.61–1.24)
GFR calc Af Amer: 40 mL/min — ABNORMAL LOW (ref >60)
GFR calc non Af Amer: 34 mL/min — ABNORMAL LOW (ref >60)
Glucose, Bld: 108 mg/dL — ABNORMAL HIGH (ref 70–99)
Potassium: 3.5 mmol/L (ref 3.5–5.1)
Sodium: 140 mmol/L (ref 135–145)
Total Bilirubin: 2.1 mg/dL — ABNORMAL HIGH (ref 0.3–1.2)
Total Protein: 6.1 g/dL — ABNORMAL LOW (ref 6.5–8.1)

## 2019-09-19 LAB — DIGOXIN LEVEL: Digoxin Level: 0.5 ng/mL — ABNORMAL LOW (ref 0.8–2.0)

## 2019-09-19 MED ORDER — TORSEMIDE 20 MG PO TABS: 60.0000 mg | ORAL_TABLET | Freq: Two times a day (BID) | ORAL | 5 refills | Status: AC

## 2019-09-19 MED ORDER — TRAZODONE HCL 50 MG PO TABS: 50.0000 mg | ORAL_TABLET | Freq: Every day | ORAL | 1 refills | Status: AC

## 2019-09-19 NOTE — Patient Instructions (Signed)
START Trazodone 50mg  (1 tab) every night for sleep aid  INCREASE Torsemide to 60mg  (3 tabs) twice a day  Labs today and repeat in 7 days at [facility] We will only contact you if something comes back abnormal or we need to make some changes. Otherwise no news is good news!  You have been referred to Electrophysiology to manage your device.  They will call you to schedule an appointment.  Your physician recommends that you schedule a follow-up appointment in:  2 weeks with the Nurse Practitioner  Please call office at [phone] option 2 if you have any questions or concerns.   At the [facility], you and your health needs are our priority. As part of our continuing mission to provide you with exceptional heart care, we have created designated Provider Care Teams. These Care Teams include your primary Cardiologist (physician) and Advanced Practice Providers (APPs- Physician Assistants and Nurse Practitioners) who all work together to provide you with the care you need, when you need it.   You may see any of the following providers on your designated Care Team at your next follow up: Marland Kitchen Dr Glori Bickers . Dr [name] . Darrick Grinder, NP . Lyda Jester, PA . Audry Riles, PharmD   Please be sure to bring in all your medications bottles to every appointment.

## 2019-09-19 NOTE — Progress Notes (Addendum)
PCP: Idelle Crouch, MD HF Cardiology: Dr. Aundra Dubin  87 y.o., followed by [facility] in the past, with history of CAD s/p remote MI treated w/ CABG x 2 in 1986, chronic biventricular systolic HF 2/2 ischemic cardiomyopathy (echo in 4/21 with LVEF 16-10%, RV systolic function severely reduced), s/p dual chamber CRT-D (Medtronic), mod-severe MR, mod-severe TR,  chronic atrial fibrillation, OSA on CPAP, T2DM, Stage IV CKD, dementia and h/o colon cancer treated w/ resection in 2019.   Admitted 09/03/19 with CHF exacerbation.  He had had a functional decline over the last 9 months prior to admission.  He was in atrial fibrillation and noted by device interrogation to have been in atrial fibrillation for at least 9 months prior to admission.  BiV pacing percentage was down to 84%.  He was started on milrinone for low output HF and diuresed.  Not a candidate for advanced therapies with age and dementia. Milrinone was titrated off. He underwent TEE-guided DCCV back to NSR.  He met with palliative care in the hospital and is being followed by Hospice.   He returns for followup of CHF today.  Weight has been stable at home.  Still limited, uses walker.  Poor appetite and poor sleep.  Mild dyspnea walking longer distances but generally ok in his house.  No chest pain.  No lightheadedness though BP is soft today.   ECG (personally reviewed): NSR, BiV paced  Labs (4/21): K 4.7, creatinine 1.72  PMH: 1. Colon cancer: Treated with resection in 2019.  2. Type 2 diabetes 3. Hyperlipidemia 4. CKD stage IV 5. OSA: Uses CPAP.  6. CAD: MI with CABG x 2 in 1986.   7. Atrial fibrillation: Paroxysmal. Ablations in 5/10 and 2/12. He was on sotalol in the past but this was stopped.  - DCCV 4/21 to NSR.  8. Chronic systolic CHF: Ischemic cardiomyopathy.  - Echo (4/21): EF 15-20%, moderate LV dilation, mild RV dilation with severely decreased systolic function, moderate-severe MR, moderate-severe TR.  - TEE (4/21):  EF 20%, mild-moderately decreased RV systolic function, mild TR, mild-moderate MR.  - Medtronic CRT-D.  9. Dementia 10. Gout  Social History   Socioeconomic History  . Marital status: Married    Spouse name: Lelon Frohlich  . Number of children: 4  . Years of education: Not on file  . Highest education level: Not on file  Occupational History  . Not on file  Tobacco Use  . Smoking status: Former Smoker    Packs/day: 1.00    Years: 40.00    Pack years: 40.00    Types: Cigarettes    Quit date: 1986    Years since quitting: 35.3  . Smokeless tobacco: Never Used  Substance and Sexual Activity  . Alcohol use: Not Currently  . Drug use: Never  . Sexual activity: Not on file  Other Topics Concern  . Not on file  Social History Narrative  . Not on file   Social Determinants of Health   Financial Resource Strain:   . Difficulty of Paying Living Expenses:   Food Insecurity:   . Worried About Charity fundraiser in the Last Year:   . Arboriculturist in the Last Year:   Transportation Needs:   . Film/video editor (Medical):   Marland Kitchen Lack of Transportation (Non-Medical):   Physical Activity:   . Days of Exercise per Week:   . Minutes of Exercise per Session:   Stress:   . Feeling of Stress :  Social Connections:   . Frequency of Communication with Friends and Family:   . Frequency of Social Gatherings with Friends and Family:   . Attends Religious Services:   . Active Member of Clubs or Organizations:   . Attends Archivist Meetings:   Marland Kitchen Marital Status:   Intimate Partner Violence:   . Fear of Current or Ex-Partner:   . Emotionally Abused:   Marland Kitchen Physically Abused:   . Sexually Abused:    Family History  Problem Relation Age of Onset  . Congestive Heart Failure Mother   . Diabetes Father   . Stroke Father   . Lung cancer Brother 5  . Skin cancer Brother        squamous cell  . Heart attack Maternal Uncle    ROS: All systems reviewed and negative except as per  HPI.   BP (!) 90/52   Pulse 71   Wt 80.2 kg   SpO2 96%   BMI 23.98 kg/m  General: NAD Neck: JVP 10 cm with HJR, no thyromegaly or thyroid nodule.  Lungs: Clear to auscultation bilaterally with normal respiratory effort. CV: Nondisplaced PMI.  Heart regular S1/S2, no S3/S4, no murmur. 1+ edema to knees.  No carotid bruit. Unable to palpate pedal pulses.  Abdomen: Soft, nontender, no hepatosplenomegaly, no distention.  Skin: Intact without lesions or rashes.  Neurologic: Alert and oriented x 3.  Psych: Normal affect. Extremities: No clubbing or cyanosis.  HEENT: Normal.   Assessment/Plan: 1. Chronic systolic CHF: Suspect primarily ischemic cardiomyopathy but has now developed RV failure in setting of atrial fibrillation and elevated PA pressure. Echo in 4/21 with EF 15-20%, moderate LV dilation, mildly dilated RV with severely decreased systolic function, moderate-severe TR, moderate-severe MR. Device interrogation showed suboptimal BiV pacing (84%) in setting of persistent atrial fibrillation.  TEE-guided DCCV on 09/06/19 to NSR.  TEE showed EF 20%, mild-moderate RV dysfunction, mild TR, mild-moderate MR.  He required milrinone in the hospital but this was titrated off.  He remains in NSR today.  NYHA class III symptoms with volume overload on exam.   - Increase torsemide to 60 mg bid.  BMET today and in 10 days.   - Can continue low dose digoxin 0.0625 mg every other day, check level today.  - Continue midodrine 10 mg bid.  - With age and dementia, not candidate for advanced therapies.  - He wants to follow his device in our practice, so I will refer him to device clinic.   2. Atrial fibrillation: Paroxysmal. He has had 2 atrial fibrillation ablations in the past, 5/10 and 2/12. He was on sotalol in the past as well. He appears to have gone into atrial fibrillation (by device interrogation) around 5/20. No attempt was made to get him back into NSR after that. His BiV pacing percentage  had dropped. TEE-guided DCCV on 09/06/19.  He remains in NSR today.  - Continue amiodarone 200 mg daily.  Check CMET and TSH today.  He will need a regular eye exam.   - Continue Eliquis 2.5 mg bid.  3. CKD stage 4: BMET today.  4. CAD: S/p CABG. No chest pain. No recent coronary angiography.  - Continue Crestor and Zetia, check lipids.  - On anticoagulation, no ASA.  5. Valvular heart disease: Moderate-severe MR and TR on TTE (4/21), but only mild TR and mild-moderate MR noted on TEE (4/21).  6. Dementia: on Aricept. 7. Insomnia: I will give him a prescription for prn trazodone.  Followup with NP in 2 wks to reassess volume.   [name] 09/19/2019

## 2019-09-20 ENCOUNTER — Other Ambulatory Visit: Payer: Self-pay | Admitting: Dermatology

## 2019-09-20 ENCOUNTER — Telehealth: Payer: Self-pay

## 2019-09-20 ENCOUNTER — Ambulatory Visit (INDEPENDENT_AMBULATORY_CARE_PROVIDER_SITE_OTHER): Payer: Medicare Other

## 2019-09-20 ENCOUNTER — Other Ambulatory Visit: Payer: Self-pay | Admitting: Podiatry

## 2019-09-20 ENCOUNTER — Telehealth (HOSPITAL_COMMUNITY): Payer: Self-pay | Admitting: Cardiology

## 2019-09-20 ENCOUNTER — Other Ambulatory Visit (HOSPITAL_COMMUNITY): Payer: Self-pay | Admitting: Cardiology

## 2019-09-20 ENCOUNTER — Ambulatory Visit (INDEPENDENT_AMBULATORY_CARE_PROVIDER_SITE_OTHER): Payer: Medicare Other | Admitting: Podiatry

## 2019-09-20 ENCOUNTER — Other Ambulatory Visit: Payer: Self-pay

## 2019-09-20 ENCOUNTER — Encounter: Payer: Self-pay | Admitting: Dermatology

## 2019-09-20 DIAGNOSIS — T300 Burn of unspecified body region, unspecified degree: Secondary | ICD-10-CM

## 2019-09-20 DIAGNOSIS — M79671 Pain in right foot: Secondary | ICD-10-CM

## 2019-09-20 DIAGNOSIS — M79672 Pain in left foot: Secondary | ICD-10-CM

## 2019-09-20 DIAGNOSIS — T25031A Burn of unspecified degree of right toe(s) (nail), initial encounter: Secondary | ICD-10-CM | POA: Diagnosis not present

## 2019-09-20 DIAGNOSIS — E13621 Other specified diabetes mellitus with foot ulcer: Secondary | ICD-10-CM | POA: Diagnosis not present

## 2019-09-20 DIAGNOSIS — M79673 Pain in unspecified foot: Secondary | ICD-10-CM

## 2019-09-20 DIAGNOSIS — L97518 Non-pressure chronic ulcer of other part of right foot with other specified severity: Secondary | ICD-10-CM | POA: Diagnosis not present
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
## 2019-09-20 NOTE — Telephone Encounter (Signed)
Patients wife called to report after dermatology visit 4/22, patient was referred to surgeon for possible infection. Patient has a urgent work in appt 4/23 @ [facility] would forward to Dr Aundra Dubin as requested as FYI Patients wife appreciative of return call.
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
## 2019-09-23 ENCOUNTER — Encounter: Payer: Self-pay | Admitting: Podiatry

## 2019-09-23 ENCOUNTER — Ambulatory Visit (INDEPENDENT_AMBULATORY_CARE_PROVIDER_SITE_OTHER): Payer: Medicare Other | Admitting: Podiatry

## 2019-09-23 ENCOUNTER — Other Ambulatory Visit: Payer: Self-pay

## 2019-09-23 VITALS — Temp 97.7°F

## 2019-09-23 DIAGNOSIS — L97512 Non-pressure chronic ulcer of other part of right foot with fat layer exposed: Secondary | ICD-10-CM

## 2019-09-23 DIAGNOSIS — E13621 Other specified diabetes mellitus with foot ulcer: Secondary | ICD-10-CM

## 2019-09-23 DIAGNOSIS — L97518 Non-pressure chronic ulcer of other part of right foot with other specified severity: Secondary | ICD-10-CM

## 2019-09-23 DIAGNOSIS — T25031A Burn of unspecified degree of right toe(s) (nail), initial encounter: Secondary | ICD-10-CM
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
## 2019-09-24 ENCOUNTER — Telehealth: Payer: Self-pay

## 2019-09-24 ENCOUNTER — Telehealth: Payer: Self-pay | Admitting: *Deleted

## 2019-09-24 ENCOUNTER — Ambulatory Visit (INDEPENDENT_AMBULATORY_CARE_PROVIDER_SITE_OTHER): Payer: Medicare Other | Admitting: Internal Medicine

## 2019-09-24 ENCOUNTER — Encounter: Payer: Self-pay | Admitting: Internal Medicine

## 2019-09-24 VITALS — BP 94/48 | HR 75 | Ht 72.0 in | Wt 185.0 lb

## 2019-09-24 DIAGNOSIS — Z951 Presence of aortocoronary bypass graft: Secondary | ICD-10-CM

## 2019-09-24 DIAGNOSIS — I4819 Other persistent atrial fibrillation: Secondary | ICD-10-CM | POA: Diagnosis not present

## 2019-09-24 DIAGNOSIS — Z95810 Presence of automatic (implantable) cardiac defibrillator: Secondary | ICD-10-CM | POA: Diagnosis not present

## 2019-09-24 DIAGNOSIS — I255 Ischemic cardiomyopathy: Secondary | ICD-10-CM | POA: Diagnosis not present

## 2019-09-24 DIAGNOSIS — I5020 Unspecified systolic (congestive) heart failure: Secondary | ICD-10-CM | POA: Diagnosis not present

## 2019-09-24 LAB — CUP PACEART INCLINIC DEVICE CHECK
Battery Remaining Longevity: 3 mo
Battery Voltage: 2.78 V
Brady Statistic AP VP Percent: 11.91 %
Brady Statistic AP VS Percent: 0.02 %
Brady Statistic AS VP Percent: 87.27 %
Brady Statistic AS VS Percent: 0.79 %
Brady Statistic RA Percent Paced: 11.87 %
Brady Statistic RV Percent Paced: 98.64 %
Date Time Interrogation Session: 20210427141900
HighPow Impedance: 31 Ohm
HighPow Impedance: 43 Ohm
Implantable Lead Implant Date: 20060126
Implantable Lead Implant Date: 20140415
Implantable Lead Implant Date: 20140415
Implantable Lead Location: 753858
Implantable Lead Location: 753859
Implantable Lead Location: 753860
Implantable Lead Model: 4296
Implantable Lead Model: 5076
Implantable Lead Model: 6935
Implantable Pulse Generator Implant Date: 20140415
Lead Channel Impedance Value: 247 Ohm
Lead Channel Impedance Value: 323 Ohm
Lead Channel Impedance Value: 380 Ohm
Lead Channel Impedance Value: 380 Ohm
Lead Channel Impedance Value: 456 Ohm
Lead Channel Impedance Value: 703 Ohm
Lead Channel Pacing Threshold Amplitude: 1 V
Lead Channel Pacing Threshold Amplitude: 1 V
Lead Channel Pacing Threshold Amplitude: 1.5 V
Lead Channel Pacing Threshold Pulse Width: 0.4 ms
Lead Channel Pacing Threshold Pulse Width: 0.4 ms
Lead Channel Pacing Threshold Pulse Width: 0.4 ms
Lead Channel Sensing Intrinsic Amplitude: 0.8 mV
Lead Channel Sensing Intrinsic Amplitude: 7.6 mV
Lead Channel Setting Pacing Amplitude: 1.5 V
Lead Channel Setting Pacing Amplitude: 2 V
Lead Channel Setting Pacing Amplitude: 2.75 V
Lead Channel Setting Pacing Pulse Width: 0.4 ms
Lead Channel Setting Pacing Pulse Width: 0.4 ms
Lead Channel Setting Sensing Sensitivity: 0.3 mV

## 2019-09-24 NOTE — Telephone Encounter (Signed)
Patient requested info [facility] from [place]. Currently awaiting.
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
## 2019-09-24 NOTE — Telephone Encounter (Signed)
Faxed required form, clinicals 09/23/2019 and demographics to [facility].

## 2019-09-24 NOTE — Progress Notes (Signed)
HPI Luis Palmer is referred today by Dr. Aundra Dubin for ongoing evaluation and management of his biv ICD. He is a pleasant 86 yo man with an extensive cardiac history including atrial fib, s/p ablation remotely, chronic systolic heart failure, an ICM, s/p CABG. He had been followed at [facility]. He denies chest pain. He was in the hospital several weeks ago with decompensated CHF and was treated with milrinone. He has done well since his hospitalization. He thinks his dyspnea is at baseline. Class 3. Allergies  Allergen Reactions  . Pseudoephedrine Hcl Other (See Comments)    Other Reaction: ANURIA  . Ace Inhibitors Other (See Comments) and Cough  . Nsaids Other (See Comments)    Reaction:  Fluid retention   . Vasotec [Enalapril] Cough     Current Outpatient Medications  Medication Sig Dispense Refill  . acetaminophen (TYLENOL) 500 MG tablet Take 2 tablets (1,000 mg total) by mouth every 8 (eight) hours. 30 tablet 0  . amiodarone (PACERONE) 200 MG tablet Take 200 mg by mouth daily.    Marland Kitchen antiseptic oral rinse (BIOTENE) LIQD 15 mLs by Mouth Rinse route as needed for dry mouth. 450 mL 0  . ciprofloxacin (CIPRO) 250 MG tablet Take 1 tablet (250 mg total) by mouth 2 (two) times daily. 14 tablet 0  . diclofenac Sodium (VOLTAREN) 1 % GEL Apply 4 g topically 4 (four) times daily. 2300 g 1  . digoxin 62.5 MCG TABS Take 0.0625 mg by mouth every other day. 15 tablet 0  . donepezil (ARICEPT) 5 MG tablet Take 5 mg by mouth at bedtime.     Marland Kitchen ELIQUIS 2.5 MG TABS tablet TAKE 1 TABLET BY MOUTH TWICE A DAY 180 tablet 3  . ezetimibe (ZETIA) 10 MG tablet Take 10 mg by mouth daily.     . feeding supplement, ENSURE ENLIVE, (ENSURE ENLIVE) LIQD Take 237 mLs by mouth 2 (two) times daily between meals. 237 mL 12  . guaiFENesin (MUCINEX PO) Take 1 tablet by mouth 2 (two) times daily.    . hydrocortisone 2.5 % cream Apply 1 application topically daily as needed (rash/irritation).     Marland Kitchen latanoprost (XALATAN) 0.005 %  ophthalmic solution Place 1 drop into both eyes at bedtime.     . midodrine (PROAMATINE) 10 MG tablet Take 1 tablet (10 mg total) by mouth 3 (three) times daily. 270 tablet 3  . Multiple Vitamin (MULTIVITAMIN WITH MINERALS) TABS tablet Take 1 tablet by mouth daily. 30 tablet 0  . ondansetron (ZOFRAN) 4 MG tablet Take 1 tablet (4 mg total) by mouth every 6 (six) hours as needed for nausea. 20 tablet 0  . rosuvastatin (CRESTOR) 10 MG tablet Take 10 mg by mouth at bedtime.     . sodium chloride (OCEAN) 0.65 % nasal spray Place 1 spray into the nose as needed for congestion.     . tamsulosin (FLOMAX) 0.4 MG CAPS capsule Take 1 capsule (0.4 mg total) by mouth at bedtime. 30 capsule 0  . torsemide (DEMADEX) 20 MG tablet Take 3 tablets (60 mg total) by mouth 2 (two) times daily. 180 tablet 5  . traZODone (DESYREL) 50 MG tablet Take 1 tablet (50 mg total) by mouth at bedtime. 30 tablet 1  . VITAMIN D PO Take 1 capsule by mouth daily.      No current facility-administered medications for this visit.     Past Medical History:  Diagnosis Date  . AICD (automatic cardioverter/defibrillator) present 2006  .  Anemia   . Atrial fibrillation ([place])   . CHF (congestive heart failure) ([place])   . CKD (chronic kidney disease), stage III   . Colon cancer ([place])    "dx'd 01/24/2018"  . High cholesterol   . History of blood transfusion 01/22/2018   "I lost blood; HgB extremely low"  . History of gout   . Myocardial infarction ([place]) 1986  . OSA on CPAP   . Pneumonia 2015  . Type II diabetes mellitus ([facility])     ROS:   All systems reviewed and negative except as noted in the HPI.   Past Surgical History:  Procedure Laterality Date  . APPENDECTOMY    . ATRIAL FIBRILLATION ABLATION  X 2  . CARDIAC CATHETERIZATION    . CARDIAC DEFIBRILLATOR PLACEMENT  2006   "added lead and had battery changed once since the initial OR" (01/29/2018)  . CARDIOVERSION N/A 09/06/2019   Procedure: CARDIOVERSION;  Surgeon: Larey Dresser, MD;  Location: [facility] ENDOSCOPY;  Service: Cardiovascular;  Laterality: N/A;  . CATARACT EXTRACTION W/ INTRAOCULAR LENS  IMPLANT, BILATERAL Bilateral   . COLONOSCOPY WITH PROPOFOL N/A 01/23/2018   Procedure: COLONOSCOPY WITH PROPOFOL;  Surgeon: Lucilla Lame, MD;  Location: [facility] ENDOSCOPY;  Service: Endoscopy;  Laterality: N/A;  . CORONARY ARTERY BYPASS GRAFT  1986   "CABG X2"  . CYSTOSCOPY N/A 01/30/2018   Procedure: CYSTOSCOPY FLEXIBLE WITH INSERTION OF CATHETER;  Surgeon: Jovita Kussmaul, MD;  Location: [place];  Service: General;  Laterality: N/A;  . ESOPHAGOGASTRODUODENOSCOPY (EGD) WITH PROPOFOL N/A 01/22/2018   Procedure: ESOPHAGOGASTRODUODENOSCOPY (EGD) WITH PROPOFOL;  Surgeon: Lucilla Lame, MD;  Location: [facility] ENDOSCOPY;  Service: Endoscopy;  Laterality: N/A;  . INSERT / REPLACE / REMOVE PACEMAKER    . LAPAROSCOPIC PARTIAL COLECTOMY N/A 01/30/2018   Procedure: LAPAROSCOPIC ASSISTED EXTENDED RIGHT COLECTOMY;  Surgeon: Jovita Kussmaul, MD;  Location: [place];  Service: General;  Laterality: N/A;  . NASAL SINUS SURGERY    . TEE WITHOUT CARDIOVERSION N/A 09/06/2019   Procedure: TRANSESOPHAGEAL ECHOCARDIOGRAM (TEE);  Surgeon: Larey Dresser, MD;  Location: [facility] ENDOSCOPY;  Service: Cardiovascular;  Laterality: N/A;     Family History  Problem Relation Age of Onset  . Congestive Heart Failure Mother   . Diabetes Father   . Stroke Father   . Lung cancer Brother 17  . Skin cancer Brother        squamous cell  . Heart attack Maternal Uncle      Social History   Socioeconomic History  . Marital status: Married    Spouse name: Lelon Frohlich  . Number of children: 4  . Years of education: Not on file  . Highest education level: Not on file  Occupational History  . Not on file  Tobacco Use  . Smoking status: Former Smoker    Packs/day: 1.00    Years: 40.00    Pack years: 40.00    Types: Cigarettes    Quit date: 1986    Years since quitting: 35.3  . Smokeless tobacco: Never Used  Substance  and Sexual Activity  . Alcohol use: Not Currently  . Drug use: Never  . Sexual activity: Not on file  Other Topics Concern  . Not on file  Social History Narrative  . Not on file   Social Determinants of Health   Financial Resource Strain:   . Difficulty of Paying Living Expenses:   Food Insecurity:   . Worried About Charity fundraiser in the Last Year:   .  Ran Out of Food in the Last Year:   Transportation Needs:   . Film/video editor (Medical):   Marland Kitchen Lack of Transportation (Non-Medical):   Physical Activity:   . Days of Exercise per Week:   . Minutes of Exercise per Session:   Stress:   . Feeling of Stress :   Social Connections:   . Frequency of Communication with Friends and Family:   . Frequency of Social Gatherings with Friends and Family:   . Attends Religious Services:   . Active Member of Clubs or Organizations:   . Attends Archivist Meetings:   Marland Kitchen Marital Status:   Intimate Partner Violence:   . Fear of Current or Ex-Partner:   . Emotionally Abused:   Marland Kitchen Physically Abused:   . Sexually Abused:      BP (!) 94/48   Pulse 75   Ht 6' (1.829 m)   Wt 185 lb (83.9 kg)   SpO2 93%   BMI 25.09 kg/m   Physical Exam:  Well appearing NAD HEENT: Unremarkable Neck:  No JVD, no thyromegally Lymphatics:  No adenopathy Back:  No CVA tenderness Lungs:  Clear with no wheezes HEART:  Regular rate rhythm, no murmurs, no rubs, no clicks Abd:  soft, positive bowel sounds, no organomegally, no rebound, no guarding Ext:  2 plus pulses, no edema, no cyanosis, no clubbing Skin:  No rashes no nodules Neuro:  CN II through XII intact, motor grossly intact  EKG - nsr with biv pacing  DEVICE  Normal device function.  See PaceArt for details.   Assess/Plan: 1. Chronic systolic heart failure - his symptoms are stable class 3. He will continue his current meds.  2. ICD - his medtronic BiV ICD is approaching ERI. We discussed ICD gen change out. We discussed  switching to either a biv ppm or biv ICD. The risks/benefits/of each were reviewed. He is considering his options. 3. CAD - he is s/p CABG and denies anginal symptoms.  4. Persistent atrial fib - he will continue his current meds. He is maintaining NSR.  Mikle Bosworth.D.

## 2019-09-24 NOTE — Patient Instructions (Signed)
Medication Instructions:  Your physician recommends that you continue on your current medications as directed. Please refer to the Current Medication list given to you today.  Labwork: None ordered.  Testing/Procedures: None ordered.  Follow-Up: Your physician wants you to follow-up in: one year with Dr. Lovena Le.   You will receive a reminder letter in the mail two months in advance. If you don't receive a letter, please call our office to schedule the follow-up appointment.  Remote monitoring is used to monitor your ICD from home.   Our device clinic will get you set up with monthly monitoring.  Any Other Special Instructions Will Be Listed Below (If Applicable).  If you need a refill on your cardiac medications before your next appointment, please call your pharmacy.

## 2019-09-25 ENCOUNTER — Telehealth: Payer: Self-pay

## 2019-09-25 ENCOUNTER — Other Ambulatory Visit: Payer: Self-pay

## 2019-09-25 ENCOUNTER — Ambulatory Visit (INDEPENDENT_AMBULATORY_CARE_PROVIDER_SITE_OTHER): Payer: Medicare Other | Admitting: Dermatology

## 2019-09-25 DIAGNOSIS — I872 Venous insufficiency (chronic) (peripheral): Secondary | ICD-10-CM | POA: Diagnosis not present

## 2019-09-25 DIAGNOSIS — T300 Burn of unspecified body region, unspecified degree: Secondary | ICD-10-CM | POA: Diagnosis not present

## 2019-09-25 DIAGNOSIS — L98499 Non-pressure chronic ulcer of skin of other sites with unspecified severity: Secondary | ICD-10-CM

## 2019-09-25 DIAGNOSIS — I509 Heart failure, unspecified: Secondary | ICD-10-CM | POA: Diagnosis not present

## 2019-09-25 LAB — WOUND CULTURE

## 2019-09-25 NOTE — Telephone Encounter (Signed)
I left a message on Angie's voicemail to return my call. Will Dr. Amalia Hailey assume care of the B/L lower leg ulcerations so the patient doesn't have to see two separate providers and have two appointments, etc. since he is being seen by their office for his toes?
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
## 2019-09-25 NOTE — Telephone Encounter (Signed)
Patient accepted into [facility]. Next remote scheduled for 10/24/19.

## 2019-09-28 ENCOUNTER — Encounter: Payer: Self-pay | Admitting: Dermatology

## 2019-09-30 ENCOUNTER — Other Ambulatory Visit: Payer: Self-pay | Admitting: Podiatry

## 2019-09-30 DIAGNOSIS — T25031A Burn of unspecified degree of right toe(s) (nail), initial encounter: Secondary | ICD-10-CM

## 2019-09-30 NOTE — Addendum Note (Signed)
Addended by: Rose Phi on: 09/30/2019 01:12 PM   Modules accepted: Orders

## 2019-10-01 ENCOUNTER — Ambulatory Visit (INDEPENDENT_AMBULATORY_CARE_PROVIDER_SITE_OTHER): Payer: Medicare Other | Admitting: Podiatry

## 2019-10-01 ENCOUNTER — Other Ambulatory Visit: Payer: Self-pay

## 2019-10-01 DIAGNOSIS — L97518 Non-pressure chronic ulcer of other part of right foot with other specified severity: Secondary | ICD-10-CM

## 2019-10-01 DIAGNOSIS — E13621 Other specified diabetes mellitus with foot ulcer: Secondary | ICD-10-CM

## 2019-10-01 DIAGNOSIS — L97512 Non-pressure chronic ulcer of other part of right foot with fat layer exposed: Secondary | ICD-10-CM

## 2019-10-01 DIAGNOSIS — T25031A Burn of unspecified degree of right toe(s) (nail), initial encounter: Secondary | ICD-10-CM

## 2019-10-01 NOTE — Telephone Encounter (Signed)
Dr. Nehemiah Massed sent Dr. Amalia Hailey a message via Epic in regards to patient's care.

## 2019-10-03 ENCOUNTER — Ambulatory Visit (HOSPITAL_COMMUNITY)
Admission: RE | Admit: 2019-10-03 | Discharge: 2019-10-03 | Disposition: A | Discharge status: Home / Self Care | Source: Ambulatory Visit | Attending: Adult Health | Admitting: Adult Health

## 2019-10-03 ENCOUNTER — Other Ambulatory Visit: Payer: Self-pay

## 2019-10-03 ENCOUNTER — Encounter (HOSPITAL_COMMUNITY): Payer: Self-pay

## 2019-10-03 VITALS — BP 106/62 | HR 70 | Wt 181.4 lb

## 2019-10-03 DIAGNOSIS — F0390 Unspecified dementia without behavioral disturbance: Secondary | ICD-10-CM | POA: Insufficient documentation

## 2019-10-03 DIAGNOSIS — Z7901 Long term (current) use of anticoagulants: Secondary | ICD-10-CM | POA: Diagnosis not present

## 2019-10-03 DIAGNOSIS — I255 Ischemic cardiomyopathy: Secondary | ICD-10-CM | POA: Diagnosis not present

## 2019-10-03 DIAGNOSIS — I252 Old myocardial infarction: Secondary | ICD-10-CM | POA: Insufficient documentation

## 2019-10-03 DIAGNOSIS — Z8249 Family history of ischemic heart disease and other diseases of the circulatory system: Secondary | ICD-10-CM | POA: Insufficient documentation

## 2019-10-03 DIAGNOSIS — I2510 Atherosclerotic heart disease of native coronary artery without angina pectoris: Secondary | ICD-10-CM | POA: Insufficient documentation

## 2019-10-03 DIAGNOSIS — R531 Weakness: Secondary | ICD-10-CM | POA: Insufficient documentation

## 2019-10-03 DIAGNOSIS — I4819 Other persistent atrial fibrillation: Secondary | ICD-10-CM | POA: Diagnosis not present

## 2019-10-03 DIAGNOSIS — Z951 Presence of aortocoronary bypass graft: Secondary | ICD-10-CM | POA: Insufficient documentation

## 2019-10-03 DIAGNOSIS — G4733 Obstructive sleep apnea (adult) (pediatric): Secondary | ICD-10-CM | POA: Insufficient documentation

## 2019-10-03 DIAGNOSIS — E785 Hyperlipidemia, unspecified: Secondary | ICD-10-CM | POA: Insufficient documentation

## 2019-10-03 DIAGNOSIS — E1122 Type 2 diabetes mellitus with diabetic chronic kidney disease: Secondary | ICD-10-CM | POA: Insufficient documentation

## 2019-10-03 DIAGNOSIS — Z87891 Personal history of nicotine dependence: Secondary | ICD-10-CM | POA: Diagnosis not present

## 2019-10-03 DIAGNOSIS — N184 Chronic kidney disease, stage 4 (severe): Secondary | ICD-10-CM | POA: Insufficient documentation

## 2019-10-03 DIAGNOSIS — I5022 Chronic systolic (congestive) heart failure: Secondary | ICD-10-CM | POA: Diagnosis present

## 2019-10-03 DIAGNOSIS — G4700 Insomnia, unspecified: Secondary | ICD-10-CM | POA: Insufficient documentation

## 2019-10-03 LAB — BASIC METABOLIC PANEL
Anion gap: 8 (ref 5–15)
BUN: 30 mg/dL — ABNORMAL HIGH (ref 8–23)
CO2: 34 mmol/L — ABNORMAL HIGH (ref 22–32)
Calcium: 9.2 mg/dL (ref 8.9–10.3)
Chloride: 92 mmol/L — ABNORMAL LOW (ref 98–111)
Creatinine, Ser: 1.68 mg/dL — ABNORMAL HIGH (ref 0.61–1.24)
GFR calc Af Amer: 42 mL/min — ABNORMAL LOW (ref >60)
GFR calc non Af Amer: 36 mL/min — ABNORMAL LOW (ref >60)
Glucose, Bld: 100 mg/dL — ABNORMAL HIGH (ref 70–99)
Potassium: 3.6 mmol/L (ref 3.5–5.1)
Sodium: 134 mmol/L — ABNORMAL LOW (ref 135–145)

## 2019-10-03 MED ORDER — DIGOXIN 62.5 MCG PO TABS: 0.0625 mg | ORAL_TABLET | ORAL | 11 refills | Status: AC

## 2019-10-03 NOTE — Progress Notes (Addendum)
PCP: Idelle Crouch, MD HF Cardiology: Dr. Aundra Dubin  86 y.o., followed by [facility] in the past, with history of CAD s/p remote MI treated w/ CABG x 2 in 1986, chronic biventricular systolic HF 2/2 ischemic cardiomyopathy (echo in 4/21 with LVEF 20-35%, RV systolic function severely reduced), s/p dual chamber CRT-D (Medtronic), mod-severe MR, mod-severe TR,  chronic atrial fibrillation, OSA on CPAP, T2DM, Stage IV CKD, dementia and h/o colon cancer treated w/ resection in 2019.   Admitted 09/03/19 with CHF exacerbation.  He had had a functional decline over the last 9 months prior to admission.  He was in atrial fibrillation and noted by device interrogation to have been in atrial fibrillation for at least 9 months prior to admission.  BiV pacing percentage was down to 84%.  He was started on milrinone for low output HF and diuresed.  Not a candidate for advanced therapies with age and dementia. Milrinone was titrated off. He underwent TEE-guided DCCV back to NSR.  He met with palliative care in the hospital and is being followed by Hospice.   Today he returns for HF follow up with his wife. . Last visit torsemide was increased to 60 mg twice a day. Overall feeling weak. His wife says he is very limited and has declined over the last few weeks. SOB with most ADLs. Needs assistance ADLs.  Denies PND/Orthopnea. Appetite fair.  No fever or chills. Weight at home has been trending  down to 178 pounds.  Taking all medications. Followed by [facility].   Labs (4/21): K 4.7, creatinine 1.72  PMH: 1. Colon cancer: Treated with resection in 2019.  2. Type 2 diabetes 3. Hyperlipidemia 4. CKD stage IV 5. OSA: Uses CPAP.  6. CAD: MI with CABG x 2 in 1986.   7. Atrial fibrillation: Paroxysmal. Ablations in 5/10 and 2/12. He was on sotalol in the past but this was stopped.  - DCCV 4/21 to NSR.  8. Chronic systolic CHF: Ischemic cardiomyopathy.  - Echo (4/21): EF 15-20%, moderate LV dilation, mild  RV dilation with severely decreased systolic function, moderate-severe MR, moderate-severe TR.  - TEE (4/21): EF 20%, mild-moderately decreased RV systolic function, mild TR, mild-moderate MR.  - Medtronic CRT-D.  9. Dementia 10. Gout  Social History   Socioeconomic History  . Marital status: Married    Spouse name: Lelon Frohlich  . Number of children: 4  . Years of education: Not on file  . Highest education level: Not on file  Occupational History  . Not on file  Tobacco Use  . Smoking status: Former Smoker    Packs/day: 1.00    Years: 40.00    Pack years: 40.00    Types: Cigarettes    Quit date: 1986    Years since quitting: 35.3  . Smokeless tobacco: Never Used  Substance and Sexual Activity  . Alcohol use: Not Currently  . Drug use: Never  . Sexual activity: Not on file  Other Topics Concern  . Not on file  Social History Narrative  . Not on file   Social Determinants of Health   Financial Resource Strain:   . Difficulty of Paying Living Expenses:   Food Insecurity:   . Worried About Charity fundraiser in the Last Year:   . Arboriculturist in the Last Year:   Transportation Needs:   . Film/video editor (Medical):   Marland Kitchen Lack of Transportation (Non-Medical):   Physical Activity:   . Days of Exercise  per Week:   . Minutes of Exercise per Session:   Stress:   . Feeling of Stress :   Social Connections:   . Frequency of Communication with Friends and Family:   . Frequency of Social Gatherings with Friends and Family:   . Attends Religious Services:   . Active Member of Clubs or Organizations:   . Attends Archivist Meetings:   Marland Kitchen Marital Status:   Intimate Partner Violence:   . Fear of Current or Ex-Partner:   . Emotionally Abused:   Marland Kitchen Physically Abused:   . Sexually Abused:    Family History  Problem Relation Age of Onset  . Congestive Heart Failure Mother   . Diabetes Father   . Stroke Father   . Lung cancer Brother 48  . Skin cancer Brother         squamous cell  . Heart attack Maternal Uncle    ROS: All systems reviewed and negative except as per HPI.    BP 106/62   Pulse 70   Wt 82.3 kg (181 lb 6.4 oz)   SpO2 (!) 89%   BMI 24.60 kg/m   Wt Readings from Last 3 Encounters:  10/03/19 82.3 kg (181 lb 6.4 oz)  09/24/19 83.9 kg (185 lb)  09/19/19 80.2 kg (176 lb 12.8 oz)    General:  Arrived in a wheel chair. No resp difficulty. Appears weak. HEENT: normal Neck: supple.JVP 7-8JVD. Carotids 2+ bilat; no bruits. No lymphadenopathy or thryomegaly appreciated. Cor: PMI nondisplaced. Regular rate & rhythm. No rubs, gallops or murmurs. Lungs: clear Abdomen: soft, nontender, nondistended. No hepatosplenomegaly. No bruits or masses. Good bowel sounds. Extremities: no cyanosis, clubbing, rash, R and LLE trace-1+edema Neuro: alert & orientedx3, cranial nerves grossly intact. Weak moves all 4 extremities w/o difficulty. Affect flat  Assessment/Plan: 1. Chronic systolic CHF: Suspect primarily ischemic cardiomyopathy but has now developed RV failure in setting of atrial fibrillation and elevated PA pressure. Echo in 4/21 with EF 15-20%, moderate LV dilation, mildly dilated RV with severely decreased systolic function, moderate-severe TR, moderate-severe MR. Device interrogation showed suboptimal BiV pacing (84%) in setting of persistent atrial fibrillation.  TEE-guided DCCV on 09/06/19 to NSR.  TEE showed EF 20%, mild-moderate RV dysfunction, mild TR, mild-moderate MR.  He required milrinone in the hospital but this was titrated off.   - NYHA III with functional decline.  - Volume status stable. Continue torsemide to 60 mg bid.   - Can continue low dose digoxin 0.0625 mg every other day, check level today.  - Continue midodrine 10 mg bid.  - With age and dementia, not candidate for advanced therapies.      2. Atrial fibrillation: Paroxysmal. He has had 2 atrial fibrillation ablations in the past, 5/10 and 2/12. He was on sotalol in  the past as well. He appears to have gone into atrial fibrillation (by device interrogation) around 5/20. No attempt was made to get him back into NSR after that. His BiV pacing percentage had dropped. TEE-guided DCCV on 09/06/19.   - Continue amiodarone 200 mg daily.  AST/ALT stable.  - He will need a regular eye exam.   - Continue Eliquis 2.5 mg bid.  3. CKD stage 4: Check BMET today  4. CAD: S/p CABG. No chest pain. No recent coronary angiography.  - Continue Crestor and Zetia, check lipids.  - On anticoagulation, no ASA.  5. Valvular heart disease: Moderate-severe MR and TR on TTE (4/21), but only mild TR and mild-moderate  MR noted on TEE (4/21).  6. Dementia: on Aricept. 7. Insomnia:     Received a call from his family due to progressive decline prior to the visit. According to his wife he has eating very little and is extremely weak and dyspneic with exertion. We had goals of care discussion. He is currently followed by [facility] and I explained most people followed by Hospice have a life expectancy of < 6 months.  He was some what surprised by this statement. I encouraged him and his wife to also talk with Hospice about heart failure trajectory and at some point he could stop non essential medications. I also discussed that if there are foods that he would like to eat he should liberalize his diet. I will discuss with Dr Aundra Dubin as he would like to review his medication list and consider stopping some medications.   Follow up with Dr Aundra Dubin 2-3 weeks.  Greater than 50% of the (total minutes 45*) visit spent in counseling/coordination of care regarding the above.       NP-C  10/03/2019

## 2019-10-03 NOTE — Patient Instructions (Signed)
A refill have been sent to your pharmacy for digoxin  Labs today We will only contact you if something comes back abnormal or we need to make some changes. Otherwise no news is good news!  Your physician recommends that you schedule a follow-up appointment in: 1 month with Dr Aundra Dubin  Please call office at [phone] option 2 if you have any questions or concerns.   At the [facility], you and your health needs are our priority. As part of our continuing mission to provide you with exceptional heart care, we have created designated Provider Care Teams. These Care Teams include your primary Cardiologist (physician) and Advanced Practice Providers (APPs- Physician Assistants and Nurse Practitioners) who all work together to provide you with the care you need, when you need it.   You may see any of the following providers on your designated Care Team at your next follow up: Marland Kitchen Dr Glori Bickers . Dr [name] . Darrick Grinder, NP . Lyda Jester, PA . Audry Riles, PharmD   Please be sure to bring in all your medications bottles to every appointment.

## 2019-10-04 ENCOUNTER — Telehealth: Payer: Self-pay | Admitting: *Deleted

## 2019-10-04 NOTE — Telephone Encounter (Signed)
"  We saw Dr. Amalia Hailey on Tuesday.  He was going to set up wound care for my husband for every 2 days.  I haven't heard anything about a schedule or who I should call or who will contact me.  Would you please follow-up with that and give me a call back."

## 2019-10-07 ENCOUNTER — Telehealth: Payer: Self-pay

## 2019-10-07 NOTE — Telephone Encounter (Signed)
"  I'm calling again about the assistance with wound care.  I called last week and no one has called me back."  I received your message on Friday and I sent a message to Dr. Amalia Hailey as well as his assistant, Janace Hoard.  Hopefully you will hear something from Angie this afternoon.  "Okay, thanks for letting me know.  He really needs some attention with his foot."

## 2019-10-07 NOTE — Telephone Encounter (Signed)
-----   Message from Edrick Kins, DPM sent at 10/01/2019  1:40 PM EDT ----- Regarding: Home health wound care Please place orders for [place] every other day  Dx: ulcers bilateral. Edema bilateral.   - betadine soaked wet gauze to the forefoot and between toes.  - Unna boot foot and legs   Thanks, Dr. Amalia Hailey

## 2019-10-07 NOTE — Telephone Encounter (Signed)
Dr. Amalia Hailey, I need the last office note asap so I can fax orders and notes to Home health.  Thanks

## 2019-10-07 NOTE — Telephone Encounter (Signed)
I surely will !   Thanks, [place]

## 2019-10-07 NOTE — Telephone Encounter (Signed)
Please Advise ASAP .   Thanks, [place]

## 2019-10-08 ENCOUNTER — Other Ambulatory Visit: Payer: Self-pay

## 2019-10-08 ENCOUNTER — Ambulatory Visit (INDEPENDENT_AMBULATORY_CARE_PROVIDER_SITE_OTHER): Payer: Medicare Other | Admitting: Podiatry

## 2019-10-08 DIAGNOSIS — L97518 Non-pressure chronic ulcer of other part of right foot with other specified severity: Secondary | ICD-10-CM

## 2019-10-08 DIAGNOSIS — L97512 Non-pressure chronic ulcer of other part of right foot with fat layer exposed: Secondary | ICD-10-CM

## 2019-10-08 DIAGNOSIS — T25031A Burn of unspecified degree of right toe(s) (nail), initial encounter: Secondary | ICD-10-CM

## 2019-10-08 DIAGNOSIS — E13621 Other specified diabetes mellitus with foot ulcer: Secondary | ICD-10-CM
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
## 2019-10-09 ENCOUNTER — Telehealth (HOSPITAL_COMMUNITY): Payer: Self-pay | Admitting: *Deleted

## 2019-10-09 NOTE — Telephone Encounter (Signed)
Patients son called needing FMLA paperwork filled out. I gave them our fax number to have paperwork faxed to [place]. He requested we email completed forms to email address in patients chart.

## 2019-10-10 ENCOUNTER — Telehealth (HOSPITAL_COMMUNITY): Payer: Self-pay | Admitting: *Deleted

## 2019-10-10 NOTE — Addendum Note (Signed)
Addended by: Graceann Congress D on: 10/10/2019 09:26 AM   Modules accepted: Orders

## 2019-10-10 NOTE — Telephone Encounter (Signed)
Pt's wife left a VM stating pt was having pain around his defib, has been ongoing but more frequent this AM.  Returned call and spoke w/wife and pt, pain describes as a dull ache he states it only last a sec or 2 then resolves on its own, he states it has been occurring every since defib was placed years ago but was a little more frequent today. He states his breathing is at baseline and denies any other symptoms. Pt is followed by [facility] and the nurse is sch to see him again today at 1:30. Advised I would let nurse know what is going on and have her assess pt when she gets there  I called Dorian Pod w/[facility] and let her know what was going on, she will let the pt's nurse know and will have nurse call me when she sees pt if needed.

## 2019-10-11 ENCOUNTER — Encounter: Payer: Self-pay | Admitting: Oncology

## 2019-10-11 NOTE — Progress Notes (Signed)
Called patient for pre assessment. Wasn't able to talk to patient but spoke with wife. Wife states patient is currently being seen by hospice at this time due to congestive heart failure. She states patient has stopped eating and has dropped a considerable amount of weight in the past few weeks. She reports he has no appetite and hasn't been sleeping good. Would like to discuss with provider at appointment the next steps in treatment.

## 2019-10-13 NOTE — Progress Notes (Signed)
[place]  [phone] [phone]  ID: Luis Palmer OB: Apr 01, 1933  MR#: [id number]  TIR#:[id number]  Patient Care Team: Idelle Crouch, MD as PCP - General (Internal Medicine) Lloyd Huger, MD as Consulting Physician (Oncology)   CHIEF COMPLAINT: Stage IIa adenocarcinoma of the right colon, iron deficiency anemia.  INTERVAL HISTORY: Patient returns to clinic today for repeat laboratory, further evaluation, and consideration of additional IV Feraheme.  He reports worsening weakness and fatigue.  He has a poor appetite and significant weight loss.  He has had occasional bright red blood per rectum, but denies any melena.  He denies any pain.  He has no neurologic complaints.  He denies any recent fevers or illnesses.  He admits to cough, but denies any chest pain, shortness of breath, or hemoptysis.  He denies any nausea, vomiting, constipation, or diarrhea.  He has no urinary complaints.  Patient offers no further specific complaints today.   REVIEW OF SYSTEMS:   Review of Systems  Constitutional: Positive for malaise/fatigue and weight loss. Negative for fever.  Respiratory: Positive for cough. Negative for hemoptysis and shortness of breath.   Cardiovascular: Negative.  Negative for chest pain and leg swelling.  Gastrointestinal: Positive for blood in stool. Negative for abdominal pain and melena.  Genitourinary: Negative.  Negative for hematuria.  Musculoskeletal: Negative.  Negative for myalgias.  Skin: Negative.  Negative for rash.  Neurological: Positive for weakness. Negative for dizziness, sensory change and focal weakness.  Psychiatric/Behavioral: Negative.  The patient is not nervous/anxious.     As per HPI. Otherwise, a complete review of systems is negative.  PAST MEDICAL HISTORY: Past Medical History:  Diagnosis Date  . AICD (automatic cardioverter/defibrillator) present 2006  . Anemia   . Atrial fibrillation  ([place])   . CHF (congestive heart failure) ([place])   . CKD (chronic kidney disease), stage III   . Colon cancer ([place])    "dx'd 01/24/2018"  . High cholesterol   . History of blood transfusion 01/22/2018   "I lost blood; HgB extremely low"  . History of gout   . Myocardial infarction ([place]) 1986  . OSA on CPAP   . Pneumonia 2015  . Type II diabetes mellitus ([place])     PAST SURGICAL HISTORY: Past Surgical History:  Procedure Laterality Date  . APPENDECTOMY    . ATRIAL FIBRILLATION ABLATION  X 2  . CARDIAC CATHETERIZATION    . CARDIAC DEFIBRILLATOR PLACEMENT  2006   "added lead and had battery changed once since the initial OR" (01/29/2018)  . CARDIOVERSION N/A 09/06/2019   Procedure: CARDIOVERSION;  Surgeon: Larey Dresser, MD;  Location: [facility] ENDOSCOPY;  Service: Cardiovascular;  Laterality: N/A;  . CATARACT EXTRACTION W/ INTRAOCULAR LENS  IMPLANT, BILATERAL Bilateral   . COLONOSCOPY WITH PROPOFOL N/A 01/23/2018   Procedure: COLONOSCOPY WITH PROPOFOL;  Surgeon: Lucilla Lame, MD;  Location: [facility] ENDOSCOPY;  Service: Endoscopy;  Laterality: N/A;  . CORONARY ARTERY BYPASS GRAFT  1986   "CABG X2"  . CYSTOSCOPY N/A 01/30/2018   Procedure: CYSTOSCOPY FLEXIBLE WITH INSERTION OF CATHETER;  Surgeon: Jovita Kussmaul, MD;  Location: [place];  Service: General;  Laterality: N/A;  . ESOPHAGOGASTRODUODENOSCOPY (EGD) WITH PROPOFOL N/A 01/22/2018   Procedure: ESOPHAGOGASTRODUODENOSCOPY (EGD) WITH PROPOFOL;  Surgeon: Lucilla Lame, MD;  Location: [facility] ENDOSCOPY;  Service: Endoscopy;  Laterality: N/A;  . INSERT / REPLACE / REMOVE PACEMAKER    . LAPAROSCOPIC PARTIAL COLECTOMY N/A 01/30/2018   Procedure: LAPAROSCOPIC ASSISTED EXTENDED RIGHT COLECTOMY;  Surgeon: Jovita Kussmaul, MD;  Location: [place];  Service: General;  Laterality: N/A;  . NASAL SINUS SURGERY    . TEE WITHOUT CARDIOVERSION N/A 09/06/2019   Procedure: TRANSESOPHAGEAL ECHOCARDIOGRAM (TEE);  Surgeon: Larey Dresser, MD;  Location: [facility] ENDOSCOPY;  Service:  Cardiovascular;  Laterality: N/A;    FAMILY HISTORY: Family History  Problem Relation Age of Onset  . Congestive Heart Failure Mother   . Diabetes Father   . Stroke Father   . Lung cancer Brother 39  . Skin cancer Brother        squamous cell  . Heart attack Maternal Uncle     ADVANCED DIRECTIVES (Y/N):  N  HEALTH MAINTENANCE: Social History   Tobacco Use  . Smoking status: Former Smoker    Packs/day: 1.00    Years: 40.00    Pack years: 40.00    Types: Cigarettes    Quit date: 1986    Years since quitting: 35.3  . Smokeless tobacco: Never Used  Substance Use Topics  . Alcohol use: Not Currently  . Drug use: Never     Colonoscopy:  PAP:  Bone density:  Lipid panel:  Allergies  Allergen Reactions  . Pseudoephedrine Hcl Other (See Comments)    Other Reaction: ANURIA  . Ace Inhibitors Other (See Comments) and Cough  . Nsaids Other (See Comments)    Reaction:  Fluid retention   . Vasotec [Enalapril] Cough    Current Outpatient Medications  Medication Sig Dispense Refill  . acetaminophen (TYLENOL) 500 MG tablet Take 2 tablets (1,000 mg total) by mouth every 8 (eight) hours. 30 tablet 0  . amiodarone (PACERONE) 200 MG tablet Take 200 mg by mouth daily.    Marland Kitchen antiseptic oral rinse (BIOTENE) LIQD 15 mLs by Mouth Rinse route as needed for dry mouth. 450 mL 0  . canagliflozin (INVOKANA) 100 MG TABS tablet Take 100 mg by mouth daily before breakfast.    . Cholecalciferol (VITAMIN D3) 50 MCG (2000 UT) capsule Take 2,000 Units by mouth daily.    . ciprofloxacin (CIPRO) 250 MG tablet Take 1 tablet (250 mg total) by mouth 2 (two) times daily. 14 tablet 0  . diclofenac Sodium (VOLTAREN) 1 % GEL Apply 4 g topically 4 (four) times daily. 2300 g 1  . Digoxin 62.5 MCG TABS Take 0.0625 mg by mouth every other day. 15 tablet 11  . donepezil (ARICEPT) 5 MG tablet Take 5 mg by mouth at bedtime.     Marland Kitchen ELIQUIS 2.5 MG TABS tablet TAKE 1 TABLET BY MOUTH TWICE A DAY 180 tablet 3  .  ezetimibe (ZETIA) 10 MG tablet Take 10 mg by mouth daily.     . hydrocortisone 2.5 % cream Apply 1 application topically daily as needed (rash/irritation).     Marland Kitchen latanoprost (XALATAN) 0.005 % ophthalmic solution Place 1 drop into both eyes at bedtime.     . midodrine (PROAMATINE) 10 MG tablet Take 1 tablet (10 mg total) by mouth 3 (three) times daily. 270 tablet 3  . Multiple Vitamin (MULTIVITAMIN WITH MINERALS) TABS tablet Take 1 tablet by mouth daily. 30 tablet 0  . rosuvastatin (CRESTOR) 10 MG tablet Take 10 mg by mouth at bedtime.     . sodium chloride (OCEAN) 0.65 % nasal spray Place 1 spray into the nose as needed for congestion.     . tamsulosin (FLOMAX) 0.4 MG CAPS capsule Take 1 capsule (0.4 mg total) by mouth at bedtime. 30 capsule 0  . torsemide (  DEMADEX) 20 MG tablet Take 3 tablets (60 mg total) by mouth 2 (two) times daily. 180 tablet 5  . traZODone (DESYREL) 50 MG tablet Take 1 tablet (50 mg total) by mouth at bedtime. 30 tablet 1  . Zinc 50 MG CAPS Take by mouth.    . feeding supplement, ENSURE ENLIVE, (ENSURE ENLIVE) LIQD Take 237 mLs by mouth 2 (two) times daily between meals. (Patient not taking: Reported on 10/11/2019) 237 mL 12  . guaiFENesin (MUCINEX PO) Take 1 tablet by mouth 2 (two) times daily.    . ondansetron (ZOFRAN) 4 MG tablet Take 1 tablet (4 mg total) by mouth every 6 (six) hours as needed for nausea. (Patient not taking: Reported on 10/11/2019) 20 tablet 0   No current facility-administered medications for this visit.    OBJECTIVE: Vitals:   10/14/19 1332 10/14/19 1357  BP: (!) 92/53 129/83  Pulse: 68 84  Resp: 18 17  Temp: (!) 95 F (35 C) (!) 97.1 F (36.2 C)  SpO2: 94%      Body mass index is 26.07 kg/m.    ECOG FS:2 - Symptomatic, <50% confined to bed  General: Thin, no acute distress.  Sitting in a wheelchair. Eyes: Pink conjunctiva, anicteric sclera. HEENT: Normocephalic, moist mucous membranes. Lungs: No audible wheezing or coughing. Heart:  Regular rate and rhythm. Abdomen: Soft, nontender, no obvious distention. Musculoskeletal: No edema, cyanosis, or clubbing. Neuro: Alert, answering all questions appropriately. Cranial nerves grossly intact. Skin: No rashes or petechiae noted. Psych: Normal affect.  LAB RESULTS:  Lab Results  Component Value Date   NA 134 (L) 10/03/2019   K 3.6 10/03/2019   CL 92 (L) 10/03/2019   CO2 34 (H) 10/03/2019   GLUCOSE 100 (H) 10/03/2019   BUN 30 (H) 10/03/2019   CREATININE 1.68 (H) 10/03/2019   CALCIUM 9.2 10/03/2019   PROT 6.1 (L) 09/19/2019   ALBUMIN 3.2 (L) 09/19/2019   AST 27 09/19/2019   ALT 33 09/19/2019   ALKPHOS 105 09/19/2019   BILITOT 2.1 (H) 09/19/2019   GFRNONAA 36 (L) 10/03/2019   GFRAA 42 (L) 10/03/2019    Lab Results  Component Value Date   WBC 3.4 (L) 10/14/2019   NEUTROABS 1.4 (L) 10/14/2019   HGB 8.3 (L) 10/14/2019   HCT 25.2 (L) 10/14/2019   MCV 91.3 10/14/2019   PLT 119 (L) 10/14/2019   Lab Results  Component Value Date   IRON 47 07/17/2019   TIBC 309 07/17/2019   IRONPCTSAT 15 (L) 07/17/2019   Lab Results  Component Value Date   FERRITIN 421 (H) 07/17/2019      STUDIES: DG Foot Complete Left  Result Date: 09/30/2019 Please see detailed radiograph report in office note.  DG Foot Complete Right  Result Date: 09/30/2019 Please see detailed radiograph report in office note.  CUP PACEART INCLINIC DEVICE CHECK  Result Date: 09/24/2019 CRT-D device check in office. Thresholds and sensing consistent with previous device measurements. Lead impedance trends stable over time. No mode switch episodes recorded. No ventricular arrhythmia episodes recorded. Patient bi-ventricularly pacing 98.6% of the time. Device programmed with appropriate safety margins. Heart failure diagnostics reviewed and trends are stable for patient.  No changes made this session. Estimated longevity 3 months.  Patient enrolled in remote follow up 10/24/19. Monthly battery checks  placed. Patient advised to plug monitor in wall.  Patient education completed including. ROV w/ GT in 1 year.Lavenia Atlas, BSN, RN   ASSESSMENT: Stage IIa adenocarcinoma of the right colon, iron  deficiency anemia.  PLAN:   1. Stage IIa adenocarcinoma of the right colon: Patient underwent partial colectomy on January 30, 2018 revealing the above-stated malignancy.  0 of 27 lymph nodes were negative for disease.  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  He was also noted to be MSI high.  Patient's with MSI high may have a better prognosis and typically do not benefit from adjuvant 5-FU therapy.  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  Patient's preop CEA was elevated at 8.3.  Today's result is pending.  Patient had a CT scan on September 21, 2018 that revealed no evidence of recurrent or progressive disease, but given his significant weight loss we will repeat imaging in the next week.  Return to clinic in 1 week for further evaluation.   2.  Iron deficiency anemia: Patient's hemoglobin has trended down significantly and he is symptomatic.  Iron stores are pending at time of dictation.  Proceed with 510 mg IV Feraheme today.  Return to clinic in 1 week for further evaluation, discussion of his imaging results, and additional IV Feraheme.   3.  Thrombocytopenia: Platelets have trended up to 119.  Monitor. 4.  Elevated ferritin: Today's result is pending. 5.  Cough: Will get CT of the chest along with abdomen and pelvis as above.  Patient expressed understanding and was in agreement with this plan. He also understands that He can call clinic at any time with any questions, concerns, or complaints.    Lloyd Huger, MD   10/14/2019 2:05 PM

## 2019-10-14 ENCOUNTER — Inpatient Hospital Stay

## 2019-10-14 ENCOUNTER — Inpatient Hospital Stay (HOSPITAL_BASED_OUTPATIENT_CLINIC_OR_DEPARTMENT_OTHER): Admitting: Oncology

## 2019-10-14 ENCOUNTER — Other Ambulatory Visit: Payer: Self-pay | Admitting: Emergency Medicine

## 2019-10-14 ENCOUNTER — Other Ambulatory Visit: Payer: Self-pay

## 2019-10-14 ENCOUNTER — Encounter: Payer: Self-pay | Admitting: Oncology

## 2019-10-14 ENCOUNTER — Inpatient Hospital Stay: Attending: Oncology

## 2019-10-14 ENCOUNTER — Telehealth (HOSPITAL_COMMUNITY): Payer: Self-pay | Admitting: *Deleted

## 2019-10-14 VITALS — BP 93/40 | HR 60 | Temp 96.0°F | Resp 18

## 2019-10-14 VITALS — BP 92/53 | HR 68 | Temp 95.0°F | Resp 18 | Wt 161.1 lb

## 2019-10-14 DIAGNOSIS — D509 Iron deficiency anemia, unspecified: Secondary | ICD-10-CM

## 2019-10-14 DIAGNOSIS — C189 Malignant neoplasm of colon, unspecified: Secondary | ICD-10-CM

## 2019-10-14 DIAGNOSIS — C182 Malignant neoplasm of ascending colon: Secondary | ICD-10-CM | POA: Insufficient documentation

## 2019-10-14 DIAGNOSIS — I255 Ischemic cardiomyopathy: Secondary | ICD-10-CM | POA: Diagnosis not present

## 2019-10-14 DIAGNOSIS — R531 Weakness: Secondary | ICD-10-CM | POA: Diagnosis not present

## 2019-10-14 DIAGNOSIS — D696 Thrombocytopenia, unspecified: Secondary | ICD-10-CM | POA: Diagnosis not present

## 2019-10-14 DIAGNOSIS — Z79899 Other long term (current) drug therapy: Secondary | ICD-10-CM | POA: Diagnosis not present

## 2019-10-14 DIAGNOSIS — R05 Cough: Secondary | ICD-10-CM | POA: Diagnosis not present

## 2019-10-14 DIAGNOSIS — R634 Abnormal weight loss: Secondary | ICD-10-CM | POA: Diagnosis not present

## 2019-10-14 LAB — IRON AND TIBC
Iron: 23 ug/dL — ABNORMAL LOW (ref 45–182)
Saturation Ratios: 11 % — ABNORMAL LOW (ref 17.9–39.5)
TIBC: 206 ug/dL — ABNORMAL LOW (ref 250–450)
UIBC: 183 ug/dL

## 2019-10-14 LAB — FERRITIN: Ferritin: 770 ng/mL — ABNORMAL HIGH (ref 24–336)

## 2019-10-14 LAB — CBC WITH DIFFERENTIAL/PLATELET
Abs Immature Granulocytes: 0.01 10*3/uL (ref 0.00–0.07)
Basophils Absolute: 0 10*3/uL (ref 0.0–0.1)
Basophils Relative: 1 %
Eosinophils Absolute: 0.2 10*3/uL (ref 0.0–0.5)
Eosinophils Relative: 7 %
HCT: 25.2 % — ABNORMAL LOW (ref 39.0–52.0)
Hemoglobin: 8.3 g/dL — ABNORMAL LOW (ref 13.0–17.0)
Immature Granulocytes: 0 %
Lymphocytes Relative: 31 %
Lymphs Abs: 1 10*3/uL (ref 0.7–4.0)
MCH: 30.1 pg (ref 26.0–34.0)
MCHC: 32.9 g/dL (ref 30.0–36.0)
MCV: 91.3 fL (ref 80.0–100.0)
Monocytes Absolute: 0.7 10*3/uL (ref 0.1–1.0)
Monocytes Relative: 21 %
Neutro Abs: 1.4 10*3/uL — ABNORMAL LOW (ref 1.7–7.7)
Neutrophils Relative %: 40 %
Platelets: 119 10*3/uL — ABNORMAL LOW (ref 150–400)
RBC: 2.76 MIL/uL — ABNORMAL LOW (ref 4.22–5.81)
RDW: 17.7 % — ABNORMAL HIGH (ref 11.5–15.5)
WBC: 3.4 10*3/uL — ABNORMAL LOW (ref 4.0–10.5)
nRBC: 0 % (ref 0.0–0.2)

## 2019-10-14 MED ORDER — SODIUM CHLORIDE 0.9 % IV SOLN
Freq: Once | INTRAVENOUS | Status: AC
  Filled 2019-10-14: qty 250

## 2019-10-14 MED ORDER — SODIUM CHLORIDE 0.9 % IV SOLN
510.0000 mg | Freq: Once | INTRAVENOUS | Status: AC
  Administered 2019-10-14: 510 mg via INTRAVENOUS
  Filled 2019-10-14: qty 510

## 2019-10-14 NOTE — Progress Notes (Signed)
Pt denies to stay for full 30 minute post observation. No s/s of distress noted, pt tolerated infusion well. Pt and VS stable at discharge.

## 2019-10-14 NOTE — Telephone Encounter (Signed)
pts son left VM asking about FMLA paperwork he needed it filled out and emailed to pts email address in chart.  Routed to [employer] and Valeda Malm, RN

## 2019-10-15 ENCOUNTER — Other Ambulatory Visit (HOSPITAL_COMMUNITY): Payer: Self-pay

## 2019-10-15 ENCOUNTER — Ambulatory Visit: Payer: Medicare Other | Admitting: Podiatry

## 2019-10-15 ENCOUNTER — Other Ambulatory Visit: Payer: Self-pay

## 2019-10-15 ENCOUNTER — Ambulatory Visit (INDEPENDENT_AMBULATORY_CARE_PROVIDER_SITE_OTHER): Payer: Medicare Other | Admitting: Podiatry

## 2019-10-15 DIAGNOSIS — E13621 Other specified diabetes mellitus with foot ulcer: Secondary | ICD-10-CM

## 2019-10-15 DIAGNOSIS — T25031A Burn of unspecified degree of right toe(s) (nail), initial encounter: Secondary | ICD-10-CM

## 2019-10-15 DIAGNOSIS — L97518 Non-pressure chronic ulcer of other part of right foot with other specified severity: Secondary | ICD-10-CM | POA: Diagnosis not present

## 2019-10-15 DIAGNOSIS — L97512 Non-pressure chronic ulcer of other part of right foot with fat layer exposed: Secondary | ICD-10-CM

## 2019-10-15 LAB — CEA: CEA: 1.9 ng/mL (ref 0.0–4.7)

## 2019-10-15 NOTE — Progress Notes (Signed)
error 

## 2019-10-15 NOTE — Telephone Encounter (Signed)
FMLA paperwork signed by MD and emailed to patients son. Original scanned into patients chart
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
## 2019-10-15 NOTE — Telephone Encounter (Signed)
FMLA paperwork completed. Awaiting MD signature. Advised son Legrand [place] of same. Once signed, will be scanned into system to be emailed by office staff to him.

## 2019-10-17 ENCOUNTER — Telehealth: Payer: Self-pay

## 2019-10-17 ENCOUNTER — Other Ambulatory Visit: Payer: Self-pay

## 2019-10-17 ENCOUNTER — Ambulatory Visit
Admission: RE | Admit: 2019-10-17 | Discharge: 2019-10-17 | Disposition: A | Discharge status: Home / Self Care | Source: Ambulatory Visit | Attending: Oncology | Admitting: Oncology

## 2019-10-17 DIAGNOSIS — C189 Malignant neoplasm of colon, unspecified: Secondary | ICD-10-CM | POA: Diagnosis present

## 2019-10-17 LAB — POCT I-STAT CREATININE: Creatinine, Ser: 1.7 mg/dL — ABNORMAL HIGH (ref 0.61–1.24)

## 2019-10-17 MED ORDER — IOHEXOL 300 MG/ML  SOLN
75.0000 mL | Freq: Once | INTRAMUSCULAR | Status: AC | PRN
  Administered 2019-10-17: 75 mL via INTRAVENOUS

## 2019-10-17 NOTE — Telephone Encounter (Signed)
Order has been verbally placed with [facility] ([phone] for skilled nursing wound care

## 2019-10-17 NOTE — Telephone Encounter (Signed)
-----   Message from Edrick Kins, DPM sent at 10/15/2019  4:15 PM EDT ----- Regarding: Hospice dressing changes Patient on hospice. Please arrange dressing changes 3x/week (every other day). Betadine soaked gauze to the forefoot. Unna boot wraps up the calf.   Dr. Amalia Hailey

## 2019-10-18 ENCOUNTER — Telehealth (HOSPITAL_COMMUNITY): Payer: Self-pay | Admitting: *Deleted

## 2019-10-18 NOTE — Telephone Encounter (Signed)
FMLA paperwork for Luis Palmer (son) emailed to [email or web address]

## 2019-10-19 ENCOUNTER — Other Ambulatory Visit (HOSPITAL_COMMUNITY): Payer: Self-pay | Admitting: Cardiology

## 2019-10-22 ENCOUNTER — Inpatient Hospital Stay

## 2019-10-22 ENCOUNTER — Ambulatory Visit: Payer: Medicare Other | Admitting: Podiatry

## 2019-10-22 ENCOUNTER — Inpatient Hospital Stay: Admitting: Oncology

## 2019-10-24 ENCOUNTER — Telehealth (HOSPITAL_COMMUNITY): Payer: Self-pay | Admitting: *Deleted

## 2019-10-24 NOTE — Telephone Encounter (Signed)
pts wife called to notify office that patient passed away Sep 19, 2022.

## 2019-10-29 NOTE — Progress Notes (Deleted)
[place]  [phone] [phone]) [phone]  ID: Luis Palmer OB: March 03, 1933  MR#: [id number]  KJZ#:[id number]  Patient Care Team: Idelle Crouch, MD as PCP - General (Internal Medicine) Lloyd Huger, MD as Consulting Physician (Oncology)   CHIEF COMPLAINT: Stage IIa adenocarcinoma of the right colon, iron deficiency anemia.  INTERVAL HISTORY: Patient returns to clinic today for repeat laboratory, further evaluation, and consideration of additional IV Feraheme.  He reports worsening weakness and fatigue.  He has a poor appetite and significant weight loss.  He has had occasional bright red blood per rectum, but denies any melena.  He denies any pain.  He has no neurologic complaints.  He denies any recent fevers or illnesses.  He admits to cough, but denies any chest pain, shortness of breath, or hemoptysis.  He denies any nausea, vomiting, constipation, or diarrhea.  He has no urinary complaints.  Patient offers no further specific complaints today.   REVIEW OF SYSTEMS:   Review of Systems  Constitutional: Positive for malaise/fatigue and weight loss. Negative for fever.  Respiratory: Positive for cough. Negative for hemoptysis and shortness of breath.   Cardiovascular: Negative.  Negative for chest pain and leg swelling.  Gastrointestinal: Positive for blood in stool. Negative for abdominal pain and melena.  Genitourinary: Negative.  Negative for hematuria.  Musculoskeletal: Negative.  Negative for myalgias.  Skin: Negative.  Negative for rash.  Neurological: Positive for weakness. Negative for dizziness, sensory change and focal weakness.  Psychiatric/Behavioral: Negative.  The patient is not nervous/anxious.     As per HPI. Otherwise, a complete review of systems is negative.  PAST MEDICAL HISTORY: Past Medical History:  Diagnosis Date  . AICD (automatic cardioverter/defibrillator) present 2006  . Anemia   . Atrial fibrillation  ([place])   . CHF (congestive heart failure) ([place])   . CKD (chronic kidney disease), stage III   . Colon cancer ([place])    "dx'd 01/24/2018"  . High cholesterol   . History of blood transfusion 01/22/2018   "I lost blood; HgB extremely low"  . History of gout   . Myocardial infarction ([place]) 1986  . OSA on CPAP   . Pneumonia 2015  . Type II diabetes mellitus ([place])     PAST SURGICAL HISTORY: Past Surgical History:  Procedure Laterality Date  . APPENDECTOMY    . ATRIAL FIBRILLATION ABLATION  X 2  . CARDIAC CATHETERIZATION    . CARDIAC DEFIBRILLATOR PLACEMENT  2006   "added lead and had battery changed once since the initial OR" (01/29/2018)  . CARDIOVERSION N/A 09/06/2019   Procedure: CARDIOVERSION;  Surgeon: Larey Dresser, MD;  Location: [facility] ENDOSCOPY;  Service: Cardiovascular;  Laterality: N/A;  . CATARACT EXTRACTION W/ INTRAOCULAR LENS  IMPLANT, BILATERAL Bilateral   . COLONOSCOPY WITH PROPOFOL N/A 01/23/2018   Procedure: COLONOSCOPY WITH PROPOFOL;  Surgeon: Lucilla Lame, MD;  Location: [facility] ENDOSCOPY;  Service: Endoscopy;  Laterality: N/A;  . CORONARY ARTERY BYPASS GRAFT  1986   "CABG X2"  . CYSTOSCOPY N/A 01/30/2018   Procedure: CYSTOSCOPY FLEXIBLE WITH INSERTION OF CATHETER;  Surgeon: Jovita Kussmaul, MD;  Location: [place];  Service: General;  Laterality: N/A;  . ESOPHAGOGASTRODUODENOSCOPY (EGD) WITH PROPOFOL N/A 01/22/2018   Procedure: ESOPHAGOGASTRODUODENOSCOPY (EGD) WITH PROPOFOL;  Surgeon: Lucilla Lame, MD;  Location: [facility] ENDOSCOPY;  Service: Endoscopy;  Laterality: N/A;  . INSERT / REPLACE / REMOVE PACEMAKER    . LAPAROSCOPIC PARTIAL COLECTOMY N/A 01/30/2018   Procedure: LAPAROSCOPIC ASSISTED EXTENDED RIGHT COLECTOMY;  Surgeon: Jovita Kussmaul, MD;  Location: [place];  Service: General;  Laterality: N/A;  . NASAL SINUS SURGERY    . TEE WITHOUT CARDIOVERSION N/A 09/06/2019   Procedure: TRANSESOPHAGEAL ECHOCARDIOGRAM (TEE);  Surgeon: Larey Dresser, MD;  Location: [facility] ENDOSCOPY;  Service:  Cardiovascular;  Laterality: N/A;    FAMILY HISTORY: Family History  Problem Relation Age of Onset  . Congestive Heart Failure Mother   . Diabetes Father   . Stroke Father   . Lung cancer Brother 49  . Skin cancer Brother        squamous cell  . Heart attack Maternal Uncle     ADVANCED DIRECTIVES (Y/N):  N  HEALTH MAINTENANCE: Social History   Tobacco Use  . Smoking status: Former Smoker    Packs/day: 1.00    Years: 40.00    Pack years: 40.00    Types: Cigarettes    Quit date: 1986    Years since quitting: 35.4  . Smokeless tobacco: Never Used  Substance Use Topics  . Alcohol use: Not Currently  . Drug use: Never     Colonoscopy:  PAP:  Bone density:  Lipid panel:  Allergies  Allergen Reactions  . Pseudoephedrine Hcl Other (See Comments)    Other Reaction: ANURIA  . Ace Inhibitors Other (See Comments) and Cough  . Nsaids Other (See Comments)    Reaction:  Fluid retention   . Vasotec [Enalapril] Cough    Current Outpatient Medications  Medication Sig Dispense Refill  . acetaminophen (TYLENOL) 500 MG tablet Take 2 tablets (1,000 mg total) by mouth every 8 (eight) hours. 30 tablet 0  . amiodarone (PACERONE) 200 MG tablet Take 200 mg by mouth daily.    Marland Kitchen antiseptic oral rinse (BIOTENE) LIQD 15 mLs by Mouth Rinse route as needed for dry mouth. 450 mL 0  . canagliflozin (INVOKANA) 100 MG TABS tablet Take 100 mg by mouth daily before breakfast.    . Cholecalciferol (VITAMIN D3) 50 MCG (2000 UT) capsule Take 2,000 Units by mouth daily.    . ciprofloxacin (CIPRO) 250 MG tablet Take 1 tablet (250 mg total) by mouth 2 (two) times daily. 14 tablet 0  . diclofenac Sodium (VOLTAREN) 1 % GEL Apply 4 g topically 4 (four) times daily. 2300 g 1  . Digoxin 62.5 MCG TABS Take 0.0625 mg by mouth every other day. 15 tablet 11  . donepezil (ARICEPT) 5 MG tablet Take 5 mg by mouth at bedtime.     Marland Kitchen ELIQUIS 2.5 MG TABS tablet TAKE 1 TABLET BY MOUTH TWICE A DAY 180 tablet 3  .  ezetimibe (ZETIA) 10 MG tablet Take 10 mg by mouth daily.     . feeding supplement, ENSURE ENLIVE, (ENSURE ENLIVE) LIQD Take 237 mLs by mouth 2 (two) times daily between meals. (Patient not taking: Reported on 10/11/2019) 237 mL 12  . guaiFENesin (MUCINEX PO) Take 1 tablet by mouth 2 (two) times daily.    . hydrocortisone 2.5 % cream Apply 1 application topically daily as needed (rash/irritation).     Marland Kitchen latanoprost (XALATAN) 0.005 % ophthalmic solution Place 1 drop into both eyes at bedtime.     . midodrine (PROAMATINE) 10 MG tablet Take 1 tablet (10 mg total) by mouth 3 (three) times daily. 270 tablet 3  . Multiple Vitamin (MULTIVITAMIN WITH MINERALS) TABS tablet Take 1 tablet by mouth daily. 30 tablet 0  . ondansetron (ZOFRAN) 4 MG tablet Take 1 tablet (4 mg total) by mouth every 6 (six)  hours as needed for nausea. (Patient not taking: Reported on 10/11/2019) 20 tablet 0  . rosuvastatin (CRESTOR) 10 MG tablet Take 10 mg by mouth at bedtime.     . sodium chloride (OCEAN) 0.65 % nasal spray Place 1 spray into the nose as needed for congestion.     . tamsulosin (FLOMAX) 0.4 MG CAPS capsule Take 1 capsule (0.4 mg total) by mouth at bedtime. 30 capsule 0  . torsemide (DEMADEX) 20 MG tablet Take 3 tablets (60 mg total) by mouth 2 (two) times daily. 180 tablet 5  . traZODone (DESYREL) 50 MG tablet Take 1 tablet (50 mg total) by mouth at bedtime. 30 tablet 1  . Zinc 50 MG CAPS Take by mouth.     No current facility-administered medications for this visit.    OBJECTIVE: There were no vitals filed for this visit.   There is no height or weight on file to calculate BMI.    ECOG FS:2 - Symptomatic, <50% confined to bed  General: Thin, no acute distress.  Sitting in a wheelchair. Eyes: Pink conjunctiva, anicteric sclera. HEENT: Normocephalic, moist mucous membranes. Lungs: No audible wheezing or coughing. Heart: Regular rate and rhythm. Abdomen: Soft, nontender, no obvious distention. Musculoskeletal:  No edema, cyanosis, or clubbing. Neuro: Alert, answering all questions appropriately. Cranial nerves grossly intact. Skin: No rashes or petechiae noted. Psych: Normal affect.  LAB RESULTS:  Lab Results  Component Value Date   NA 134 (L) 10/03/2019   K 3.6 10/03/2019   CL 92 (L) 10/03/2019   CO2 34 (H) 10/03/2019   GLUCOSE 100 (H) 10/03/2019   BUN 30 (H) 10/03/2019   CREATININE 1.70 (H) 10/17/2019   CALCIUM 9.2 10/03/2019   PROT 6.1 (L) 09/19/2019   ALBUMIN 3.2 (L) 09/19/2019   AST 27 09/19/2019   ALT 33 09/19/2019   ALKPHOS 105 09/19/2019   BILITOT 2.1 (H) 09/19/2019   GFRNONAA 36 (L) 10/03/2019   GFRAA 42 (L) 10/03/2019    Lab Results  Component Value Date   WBC 3.4 (L) 10/14/2019   NEUTROABS 1.4 (L) 10/14/2019   HGB 8.3 (L) 10/14/2019   HCT 25.2 (L) 10/14/2019   MCV 91.3 10/14/2019   PLT 119 (L) 10/14/2019   Lab Results  Component Value Date   IRON 23 (L) 10/14/2019   TIBC 206 (L) 10/14/2019   IRONPCTSAT 11 (L) 10/14/2019   Lab Results  Component Value Date   FERRITIN 770 (H) 10/14/2019      STUDIES: CT Chest W Contrast  Result Date: 10/17/2019 CLINICAL DATA:  Follow-up colon cancer, status post right hemicolectomy EXAM: CT CHEST, ABDOMEN, AND PELVIS WITH CONTRAST TECHNIQUE: Multidetector CT imaging of the chest, abdomen and pelvis was performed following the standard protocol during bolus administration of intravenous contrast. CONTRAST:  65m OMNIPAQUE IOHEXOL 300 MG/ML SOLN, additional oral enteric contrast COMPARISON:  CT abdomen pelvis, 09/21/2018 FINDINGS: CT CHEST FINDINGS Cardiovascular: Aortic atherosclerosis. Cardiomegaly. Three-vessel coronary artery calcifications and/or stents. Enlargement of the main pulmonary artery up to 4.0 cm. Left chest multi lead pacer defibrillator. No pericardial effusion. Mediastinum/Nodes: No enlarged mediastinal, hilar, or axillary lymph nodes. Thyroid gland, trachea, and esophagus demonstrate no significant findings.  Lungs/Pleura: Moderate right, small left pleural effusions and associated atelectasis or consolidation, increased compared to prior examination. There is very extensive, diffuse centrilobular and ground-glass nodularity throughout the lungs, which is significantly increased compared to the included lung bases on prior examination. There is a more focal, although somewhat ill-defined 8 mm pulmonary nodule of  the peripheral left upper lobe (series 2, image 28). Musculoskeletal: No chest wall mass or suspicious bone lesions identified. CT ABDOMEN PELVIS FINDINGS Hepatobiliary: No solid liver abnormality is seen. No gallstones, gallbladder wall thickening, or biliary dilatation. Pancreas: Unremarkable. No pancreatic ductal dilatation or surrounding inflammatory changes. Spleen: Punctuate calcifications of the splenic parenchyma. Adrenals/Urinary Tract: Adrenal glands are unremarkable. Kidneys are normal, without renal calculi, solid lesion, or hydronephrosis. Bladder is unremarkable. Stomach/Bowel: Stomach is within normal limits. Redemonstrated postoperative findings of right hemicolectomy and reanastomosis. Vascular/Lymphatic: Aortic atherosclerosis. No enlarged abdominal or pelvic lymph nodes. Reproductive: Prostatomegaly. Other: No abdominal wall hernia or abnormality. Small volume perihepatic and pelvic ascites. Musculoskeletal: No acute or significant osseous findings. IMPRESSION: 1. Redemonstrated postoperative findings of right hemicolectomy and reanastomosis. There is no evidence of malignant recurrence in the chest, abdomen, or pelvis. 2. There is very extensive, diffuse centrilobular and ground-glass nodularity throughout the lungs, which is significantly increased compared to the included lung bases on prior examination. Findings favor atypical infection or perhaps an unusual appearance of edema. Pulmonary metastatic disease not favored. 3. There is a more focal, although somewhat ill-defined 8 mm pulmonary  nodule of the peripheral left upper lobe, nonspecific. Metastatic disease is not strictly excluded although less favored. Attention on follow-up. 4. Moderate right, small left pleural effusions and associated atelectasis or consolidation, increased compared to prior examination. 5. Cardiomegaly. Three-vessel coronary artery calcifications and/or stents. 6. Enlargement of the main pulmonary artery up to 4.0 cm, which can be seen with pulmonaryhypertension. 7. Small volume perihepatic and pelvic ascites. 8. Aortic Atherosclerosis (ICD10-I70.0). Electronically Signed   By: Eddie Candle M.D.   On: 10/17/2019 15:32   CT Abdomen Pelvis W Contrast  Result Date: 10/17/2019 CLINICAL DATA:  Follow-up colon cancer, status post right hemicolectomy EXAM: CT CHEST, ABDOMEN, AND PELVIS WITH CONTRAST TECHNIQUE: Multidetector CT imaging of the chest, abdomen and pelvis was performed following the standard protocol during bolus administration of intravenous contrast. CONTRAST:  29m OMNIPAQUE IOHEXOL 300 MG/ML SOLN, additional oral enteric contrast COMPARISON:  CT abdomen pelvis, 09/21/2018 FINDINGS: CT CHEST FINDINGS Cardiovascular: Aortic atherosclerosis. Cardiomegaly. Three-vessel coronary artery calcifications and/or stents. Enlargement of the main pulmonary artery up to 4.0 cm. Left chest multi lead pacer defibrillator. No pericardial effusion. Mediastinum/Nodes: No enlarged mediastinal, hilar, or axillary lymph nodes. Thyroid gland, trachea, and esophagus demonstrate no significant findings. Lungs/Pleura: Moderate right, small left pleural effusions and associated atelectasis or consolidation, increased compared to prior examination. There is very extensive, diffuse centrilobular and ground-glass nodularity throughout the lungs, which is significantly increased compared to the included lung bases on prior examination. There is a more focal, although somewhat ill-defined 8 mm pulmonary nodule of the peripheral left upper  lobe (series 2, image 28). Musculoskeletal: No chest wall mass or suspicious bone lesions identified. CT ABDOMEN PELVIS FINDINGS Hepatobiliary: No solid liver abnormality is seen. No gallstones, gallbladder wall thickening, or biliary dilatation. Pancreas: Unremarkable. No pancreatic ductal dilatation or surrounding inflammatory changes. Spleen: Punctuate calcifications of the splenic parenchyma. Adrenals/Urinary Tract: Adrenal glands are unremarkable. Kidneys are normal, without renal calculi, solid lesion, or hydronephrosis. Bladder is unremarkable. Stomach/Bowel: Stomach is within normal limits. Redemonstrated postoperative findings of right hemicolectomy and reanastomosis. Vascular/Lymphatic: Aortic atherosclerosis. No enlarged abdominal or pelvic lymph nodes. Reproductive: Prostatomegaly. Other: No abdominal wall hernia or abnormality. Small volume perihepatic and pelvic ascites. Musculoskeletal: No acute or significant osseous findings. IMPRESSION: 1. Redemonstrated postoperative findings of right hemicolectomy and reanastomosis. There is no evidence of malignant recurrence in the  chest, abdomen, or pelvis. 2. There is very extensive, diffuse centrilobular and ground-glass nodularity throughout the lungs, which is significantly increased compared to the included lung bases on prior examination. Findings favor atypical infection or perhaps an unusual appearance of edema. Pulmonary metastatic disease not favored. 3. There is a more focal, although somewhat ill-defined 8 mm pulmonary nodule of the peripheral left upper lobe, nonspecific. Metastatic disease is not strictly excluded although less favored. Attention on follow-up. 4. Moderate right, small left pleural effusions and associated atelectasis or consolidation, increased compared to prior examination. 5. Cardiomegaly. Three-vessel coronary artery calcifications and/or stents. 6. Enlargement of the main pulmonary artery up to 4.0 cm, which can be seen with  pulmonaryhypertension. 7. Small volume perihepatic and pelvic ascites. 8. Aortic Atherosclerosis (ICD10-I70.0). Electronically Signed   By: Eddie Candle M.D.   On: 10/17/2019 15:32   DG Foot Complete Left  Result Date: 09/30/2019 Please see detailed radiograph report in office note.  DG Foot Complete Right  Result Date: 09/30/2019 Please see detailed radiograph report in office note.  CUP PACEART INCLINIC DEVICE CHECK  Result Date: 09/24/2019 CRT-D device check in office. Thresholds and sensing consistent with previous device measurements. Lead impedance trends stable over time. No mode switch episodes recorded. No ventricular arrhythmia episodes recorded. Patient bi-ventricularly pacing 98.6% of the time. Device programmed with appropriate safety margins. Heart failure diagnostics reviewed and trends are stable for patient.  No changes made this session. Estimated longevity 3 months.  Patient enrolled in remote follow up 10/24/19. Monthly battery checks placed. Patient advised to plug monitor in wall.  Patient education completed including. ROV w/ GT in 1 year.Lavenia Atlas, BSN, RN   ASSESSMENT: Stage IIa adenocarcinoma of the right colon, iron deficiency anemia.  PLAN:   1. Stage IIa adenocarcinoma of the right colon: Patient underwent partial colectomy on January 30, 2018 revealing the above-stated malignancy.  0 of 27 lymph nodes were negative for disease.  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  He was also noted to be MSI high.  Patient's with MSI high may have a better prognosis and typically do not benefit from adjuvant 5-FU therapy.  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  Patient's preop CEA was elevated at 8.3.  Today's result is pending.  Patient had a CT scan on September 21, 2018 that revealed no evidence of recurrent or progressive disease, but given his significant weight loss we will repeat imaging in the next week.  Return to  clinic in 1 week for further evaluation.   2.  Iron deficiency anemia: Patient's hemoglobin has trended down significantly and he is symptomatic.  Iron stores are pending at time of dictation.  Proceed with 510 mg IV Feraheme today.  Return to clinic in 1 week for further evaluation, discussion of his imaging results, and additional IV Feraheme.   3.  Thrombocytopenia: Platelets have trended up to 119.  Monitor. 4.  Elevated ferritin: Today's result is pending. 5.  Cough: Will get CT of the chest along with abdomen and pelvis as above.  Patient expressed understanding and was in agreement with this plan. He also understands that He can call clinic at any time with any questions, concerns, or complaints.    Lloyd Huger, MD   10/19/2019 6:55 AM

## 2019-10-29 DEATH — deceased

## 2019-10-31 ENCOUNTER — Encounter (HOSPITAL_COMMUNITY): Payer: Medicare Other | Admitting: Cardiology

## 2019-11-07 ENCOUNTER — Ambulatory Visit: Payer: Medicare Other | Admitting: Dermatology

## 2019-11-12 NOTE — Discharge Summary (Signed)
Luis Palmer, is a 86 y.o. male  DOB [birth year]  MRN [id number].  Admission date:  09/02/2019  Admitting Physician  Rise Patience, MD  Discharge Date:  11/12/2019   Primary MD  Idelle Crouch, MD  Recommendations for primary care physician for things to follow:   CMP, CBC   Admission Diagnosis  CHF (congestive heart failure) (HCC) [I50.9] Acute CHF (congestive heart failure) ([place]) [I50.9] Acute on chronic congestive heart failure, unspecified heart failure type ([place]) [I50.9]   Discharge Diagnosis  CHF (congestive heart failure) (HCC) [I50.9] Acute CHF (congestive heart failure) ([place]) [I50.9] Acute on chronic congestive heart failure, unspecified heart failure type ([place]) [I50.9]    Principal Problem:   Acute on chronic systolic CHF (congestive heart failure) (HCC) Active Problems:   Adenocarcinoma of colon (HCC)   Biventricular implantable cardioverter-defibrillator in situ   Type II diabetes mellitus (HCC)   CKD (chronic kidney disease) stage 3, GFR 30-59 ml/min   Pancytopenia (HCC)   CHF (congestive heart failure) (HCC)   Protein-calorie malnutrition, severe   Palliative care by specialist   Goals of care, counseling/discussion   Protein-calorie malnutrition ([place])   Generalized weakness   Chronic pain of right knee   DNR (do not resuscitate)      Past Medical History:  Diagnosis Date  . AICD (automatic cardioverter/defibrillator) present 2006  . Anemia   . Atrial fibrillation ([place])   . CHF (congestive heart failure) ([place])   . CKD (chronic kidney disease), stage III   . Colon cancer ([place])    "dx'd 01/24/2018"  . High cholesterol   . History of blood transfusion 01/22/2018   "I lost blood; HgB extremely low"  . History of gout   . Myocardial infarction ([place]) 1986  . OSA on CPAP   . Pneumonia 2015  . Type II diabetes mellitus ([place])     Past Surgical History:    Procedure Laterality Date  . APPENDECTOMY    . ATRIAL FIBRILLATION ABLATION  X 2  . CARDIAC CATHETERIZATION    . CARDIAC DEFIBRILLATOR PLACEMENT  2006   "added lead and had battery changed once since the initial OR" (01/29/2018)  . CARDIOVERSION N/A 09/06/2019   Procedure: CARDIOVERSION;  Surgeon: Larey Dresser, MD;  Location: [facility] ENDOSCOPY;  Service: Cardiovascular;  Laterality: N/A;  . CATARACT EXTRACTION W/ INTRAOCULAR LENS  IMPLANT, BILATERAL Bilateral   . COLONOSCOPY WITH PROPOFOL N/A 01/23/2018   Procedure: COLONOSCOPY WITH PROPOFOL;  Surgeon: Lucilla Lame, MD;  Location: [facility] ENDOSCOPY;  Service: Endoscopy;  Laterality: N/A;  . CORONARY ARTERY BYPASS GRAFT  1986   "CABG X2"  . CYSTOSCOPY N/A 01/30/2018   Procedure: CYSTOSCOPY FLEXIBLE WITH INSERTION OF CATHETER;  Surgeon: Jovita Kussmaul, MD;  Location: [place];  Service: General;  Laterality: N/A;  . ESOPHAGOGASTRODUODENOSCOPY (EGD) WITH PROPOFOL N/A 01/22/2018   Procedure: ESOPHAGOGASTRODUODENOSCOPY (EGD) WITH PROPOFOL;  Surgeon: Lucilla Lame, MD;  Location: [facility] ENDOSCOPY;  Service: Endoscopy;  Laterality: N/A;  . INSERT / REPLACE / REMOVE PACEMAKER    .  LAPAROSCOPIC PARTIAL COLECTOMY N/A 01/30/2018   Procedure: LAPAROSCOPIC ASSISTED EXTENDED RIGHT COLECTOMY;  Surgeon: Jovita Kussmaul, MD;  Location: [place];  Service: General;  Laterality: N/A;  . NASAL SINUS SURGERY    . TEE WITHOUT CARDIOVERSION N/A 09/06/2019   Procedure: TRANSESOPHAGEAL ECHOCARDIOGRAM (TEE);  Surgeon: Larey Dresser, MD;  Location: [facility] ENDOSCOPY;  Service: Cardiovascular;  Laterality: N/A;       HPI  from the history and physical done on the day of admission:  Luis Palmer is a 86 y.o. male with history of chronic systolic heart failure status post AICD placement usually follows with [facility], chronic kidney disease stage IV, diabetes mellitus type 2, colon cancer status post resection presents to the ER because of increasing shortness of breath and  worsening peripheral edema.  Over the last 2 months patient states torsemide dose has been changed frequently and despite which patient has been getting more edematous.  Patient also has been getting easily short of breath.  Denies any chest pain.  ED Course: In the ER on exam patient has significant edema extending all through up to the thigh bilateral lower extremity and abdominal distention.  Chest x-ray shows features consistent with fluid overload.  Labs show BNP of more than 4500 WBC count 3.8 hemoglobin 12.7 platelets 129 creatinine 2 sodium 130 total bilirubin 2 high sensitive troponin was 27.  Covid test is negative.  Patient started on Lasix 40 mg IV and admitted for further management of acute on chronic systolic heart failure.       Hospital Course:  86 year old with history of CAD status post CABG, systolic CHF EF 27% status post ICD, chronic A. fib, right-sided no carcinoma of colon status post partial resection, iron deficiency anemia, CKD stage IIIa, DM2 presented with worsening shortness of breath and peripheral edema.  Found to be in CHF exacerbation, started on diuretics and admitted to the hospital.    Acute congestive heart failure with reduced ejection fraction, 20%.  Class III -Echocardiogram- EF of 15-20%.   -Co-ox 40%, started on milrinone 0.25 with co-ox up to 69% this morning.  -Patient was started on milrinone with overall improvement and weaned off at discharge -strict input and output. -Monitor electrolytes and replete as necessary   acute kidney injury on CKD stage III A -Baseline creatinine 1.4.  Admission creatinine 2.0. >>  2.09 >>> 1.82 >> 1.71 -Entresto held and monitored closely while on diuretics with overall stabilization   Coronary artery disease status post CABG -Currently chest pain-free.  Chronic atrial fibrillation -Continue Xarelto switched to Eliquis per cardiology  History of diabetes mellitus type 2 -Sliding scale  Anemia of  chronic disease.  Also has iron deficiency -Follow-up patient oncology.  Closely monitor hemoglobin.  History of dementia -On Aricept  Right lower extremity edema/wounds noted- seen by wound care team-Unna boot been placed -We will obtain a limited lower extremity Doppler study to rule out DVT   Discharge Condition: Improved  Follow UP Hospice team Cardiology-advanced heart failure- [name], MD     Consults obtained - cardiology, advanced heart failure team Palliative medicine team Wound and ostomy team Diet and Activity recommendation:  As advised  Discharge Instructions     Discharge Instructions    ([place]) Call MD:  Anytime you have any of the following symptoms: 1) 3 pound weight gain in 24 hours or 5 pounds in 1 week 2) shortness of breath, with or without a dry hacking cough 3) swelling in  the hands, feet or stomach 4) if you have to sleep on extra pillows at night in order to breathe.   Complete by: As directed    Call MD for:  difficulty breathing, headache or visual disturbances   Complete by: As directed    Call MD for:  extreme fatigue   Complete by: As directed    Call MD for:  hives   Complete by: As directed    Call MD for:  persistant dizziness or light-headedness   Complete by: As directed    Call MD for:  persistant nausea and vomiting   Complete by: As directed    Call MD for:  redness, tenderness, or signs of infection (pain, swelling, redness, odor or green/yellow discharge around incision site)   Complete by: As directed    Call MD for:  severe uncontrolled pain   Complete by: As directed    Call MD for:  temperature >100.4   Complete by: As directed    Diet - low sodium heart healthy   Complete by: As directed    Increase activity slowly   Complete by: As directed         Discharge Medications     Allergies as of 09/08/2019      Reactions   Pseudoephedrine Hcl Other (See Comments)   Other Reaction: ANURIA   Ace  Inhibitors Other (See Comments), Cough   Nsaids Other (See Comments)   Reaction:  Fluid retention    Vasotec [enalapril] Cough      Medication List    STOP taking these medications   canagliflozin 100 MG Tabs tablet Commonly known as: INVOKANA   Entresto 24-26 MG Generic drug: sacubitril-valsartan   rivaroxaban 10 MG Tabs tablet Commonly known as: XARELTO   torsemide 20 MG tablet Commonly known as: DEMADEX     TAKE these medications   acetaminophen 500 MG tablet Commonly known as: TYLENOL Take 2 tablets (1,000 mg total) by mouth every 8 (eight) hours.   antiseptic oral rinse Liqd 15 mLs by Mouth Rinse route as needed for dry mouth.   ciprofloxacin 250 MG tablet Commonly known as: CIPRO Take 1 tablet (250 mg total) by mouth 2 (two) times daily. What changed: when to take this   diclofenac Sodium 1 % Gel Commonly known as: VOLTAREN Apply 4 g topically 4 (four) times daily.   donepezil 5 MG tablet Commonly known as: ARICEPT Take 5 mg by mouth at bedtime.   ezetimibe 10 MG tablet Commonly known as: ZETIA Take 10 mg by mouth daily.   feeding supplement (ENSURE ENLIVE) Liqd Take 237 mLs by mouth 2 (two) times daily between meals.   hydrocortisone 2.5 % cream Apply 1 application topically daily as needed (rash/irritation).   latanoprost 0.005 % ophthalmic solution Commonly known as: XALATAN Place 1 drop into both eyes at bedtime.   MUCINEX PO Take 1 tablet by mouth 2 (two) times daily.   multivitamin with minerals Tabs tablet Take 1 tablet by mouth daily.   ondansetron 4 MG tablet Commonly known as: ZOFRAN Take 1 tablet (4 mg total) by mouth every 6 (six) hours as needed for nausea.   rosuvastatin 10 MG tablet Commonly known as: CRESTOR Take 10 mg by mouth at bedtime.   sodium chloride 0.65 % nasal spray Commonly known as: OCEAN Place 1 spray into the nose as needed for congestion.   tamsulosin 0.4 MG Caps capsule Commonly known as: FLOMAX Take 1  capsule (0.4 mg total) by mouth  at bedtime.       Major procedures and Radiology Reports - PLEASE review detailed and final reports for all details, in brief -    CT Chest W Contrast  Result Date: 10/17/2019 CLINICAL DATA:  Follow-up colon cancer, status post right hemicolectomy EXAM: CT CHEST, ABDOMEN, AND PELVIS WITH CONTRAST TECHNIQUE: Multidetector CT imaging of the chest, abdomen and pelvis was performed following the standard protocol during bolus administration of intravenous contrast. CONTRAST:  51mL OMNIPAQUE IOHEXOL 300 MG/ML SOLN, additional oral enteric contrast COMPARISON:  CT abdomen pelvis, 09/21/2018 FINDINGS: CT CHEST FINDINGS Cardiovascular: Aortic atherosclerosis. Cardiomegaly. Three-vessel coronary artery calcifications and/or stents. Enlargement of the main pulmonary artery up to 4.0 cm. Left chest multi lead pacer defibrillator. No pericardial effusion. Mediastinum/Nodes: No enlarged mediastinal, hilar, or axillary lymph nodes. Thyroid gland, trachea, and esophagus demonstrate no significant findings. Lungs/Pleura: Moderate right, small left pleural effusions and associated atelectasis or consolidation, increased compared to prior examination. There is very extensive, diffuse centrilobular and ground-glass nodularity throughout the lungs, which is significantly increased compared to the included lung bases on prior examination. There is a more focal, although somewhat ill-defined 8 mm pulmonary nodule of the peripheral left upper lobe (series 2, image 28). Musculoskeletal: No chest wall mass or suspicious bone lesions identified. CT ABDOMEN PELVIS FINDINGS Hepatobiliary: No solid liver abnormality is seen. No gallstones, gallbladder wall thickening, or biliary dilatation. Pancreas: Unremarkable. No pancreatic ductal dilatation or surrounding inflammatory changes. Spleen: Punctuate calcifications of the splenic parenchyma. Adrenals/Urinary Tract: Adrenal glands are unremarkable.  Kidneys are normal, without renal calculi, solid lesion, or hydronephrosis. Bladder is unremarkable. Stomach/Bowel: Stomach is within normal limits. Redemonstrated postoperative findings of right hemicolectomy and reanastomosis. Vascular/Lymphatic: Aortic atherosclerosis. No enlarged abdominal or pelvic lymph nodes. Reproductive: Prostatomegaly. Other: No abdominal wall hernia or abnormality. Small volume perihepatic and pelvic ascites. Musculoskeletal: No acute or significant osseous findings. IMPRESSION: 1. Redemonstrated postoperative findings of right hemicolectomy and reanastomosis. There is no evidence of malignant recurrence in the chest, abdomen, or pelvis. 2. There is very extensive, diffuse centrilobular and ground-glass nodularity throughout the lungs, which is significantly increased compared to the included lung bases on prior examination. Findings favor atypical infection or perhaps an unusual appearance of edema. Pulmonary metastatic disease not favored. 3. There is a more focal, although somewhat ill-defined 8 mm pulmonary nodule of the peripheral left upper lobe, nonspecific. Metastatic disease is not strictly excluded although less favored. Attention on follow-up. 4. Moderate right, small left pleural effusions and associated atelectasis or consolidation, increased compared to prior examination. 5. Cardiomegaly. Three-vessel coronary artery calcifications and/or stents. 6. Enlargement of the main pulmonary artery up to 4.0 cm, which can be seen with pulmonaryhypertension. 7. Small volume perihepatic and pelvic ascites. 8. Aortic Atherosclerosis (ICD10-I70.0). Electronically Signed   By: Eddie Candle M.D.   On: 10/17/2019 15:32   CT Abdomen Pelvis W Contrast  Result Date: 10/17/2019 CLINICAL DATA:  Follow-up colon cancer, status post right hemicolectomy EXAM: CT CHEST, ABDOMEN, AND PELVIS WITH CONTRAST TECHNIQUE: Multidetector CT imaging of the chest, abdomen and pelvis was performed following  the standard protocol during bolus administration of intravenous contrast. CONTRAST:  45mL OMNIPAQUE IOHEXOL 300 MG/ML SOLN, additional oral enteric contrast COMPARISON:  CT abdomen pelvis, 09/21/2018 FINDINGS: CT CHEST FINDINGS Cardiovascular: Aortic atherosclerosis. Cardiomegaly. Three-vessel coronary artery calcifications and/or stents. Enlargement of the main pulmonary artery up to 4.0 cm. Left chest multi lead pacer defibrillator. No pericardial effusion. Mediastinum/Nodes: No enlarged mediastinal, hilar, or axillary lymph nodes. Thyroid gland,  trachea, and esophagus demonstrate no significant findings. Lungs/Pleura: Moderate right, small left pleural effusions and associated atelectasis or consolidation, increased compared to prior examination. There is very extensive, diffuse centrilobular and ground-glass nodularity throughout the lungs, which is significantly increased compared to the included lung bases on prior examination. There is a more focal, although somewhat ill-defined 8 mm pulmonary nodule of the peripheral left upper lobe (series 2, image 28). Musculoskeletal: No chest wall mass or suspicious bone lesions identified. CT ABDOMEN PELVIS FINDINGS Hepatobiliary: No solid liver abnormality is seen. No gallstones, gallbladder wall thickening, or biliary dilatation. Pancreas: Unremarkable. No pancreatic ductal dilatation or surrounding inflammatory changes. Spleen: Punctuate calcifications of the splenic parenchyma. Adrenals/Urinary Tract: Adrenal glands are unremarkable. Kidneys are normal, without renal calculi, solid lesion, or hydronephrosis. Bladder is unremarkable. Stomach/Bowel: Stomach is within normal limits. Redemonstrated postoperative findings of right hemicolectomy and reanastomosis. Vascular/Lymphatic: Aortic atherosclerosis. No enlarged abdominal or pelvic lymph nodes. Reproductive: Prostatomegaly. Other: No abdominal wall hernia or abnormality. Small volume perihepatic and pelvic  ascites. Musculoskeletal: No acute or significant osseous findings. IMPRESSION: 1. Redemonstrated postoperative findings of right hemicolectomy and reanastomosis. There is no evidence of malignant recurrence in the chest, abdomen, or pelvis. 2. There is very extensive, diffuse centrilobular and ground-glass nodularity throughout the lungs, which is significantly increased compared to the included lung bases on prior examination. Findings favor atypical infection or perhaps an unusual appearance of edema. Pulmonary metastatic disease not favored. 3. There is a more focal, although somewhat ill-defined 8 mm pulmonary nodule of the peripheral left upper lobe, nonspecific. Metastatic disease is not strictly excluded although less favored. Attention on follow-up. 4. Moderate right, small left pleural effusions and associated atelectasis or consolidation, increased compared to prior examination. 5. Cardiomegaly. Three-vessel coronary artery calcifications and/or stents. 6. Enlargement of the main pulmonary artery up to 4.0 cm, which can be seen with pulmonaryhypertension. 7. Small volume perihepatic and pelvic ascites. 8. Aortic Atherosclerosis (ICD10-I70.0). Electronically Signed   By: Eddie Candle M.D.   On: 10/17/2019 15:32    Micro Results   No results found for this or any previous visit (from the past 240 hour(s)).     Today   Subjective    Luis Palmer today has no new complaint.  Denies any fever or chills.  Patient's medications reviewed of family at bedside.  Patient was planned for discharge home with hospice          Patient has been seen and examined prior to discharge   Objective   Blood pressure 97/62, pulse 69, temperature 97.6 F (36.4 C), temperature source Oral, resp. rate 18, height 6' (1.829 m), weight 93.4 kg, SpO2 96 %.  No intake or output data in the 24 hours ending 11/12/19 1942  Exam Gen:-Comfortable, no acute distress HEENT:- [place].AT, moist mucous membranes Neck-Supple  Neck,No JVD,  Lungs-clear to auscultation bilaterally CV- S1, S2 normal Abd-  +ve B.Sounds, Abd Soft, No tenderness,    CNS: Ativan x3   Data Review   CBC w Diff:  Lab Results  Component Value Date   WBC 3.4 (L) 10/14/2019   HGB 8.3 (L) 10/14/2019   HGB 12.3 (L) 05/01/2014   HCT 25.2 (L) 10/14/2019   HCT 37.2 (L) 05/01/2014   PLT 119 (L) 10/14/2019   PLT 133 (L) 05/01/2014   LYMPHOPCT 31 10/14/2019   LYMPHOPCT 37.8 05/01/2014   BANDSPCT 0 01/28/2018   MONOPCT 21 10/14/2019   MONOPCT 17.7 05/01/2014   EOSPCT 7 10/14/2019   EOSPCT  5.7 05/01/2014   BASOPCT 1 10/14/2019   BASOPCT 0.6 05/01/2014    CMP:  Lab Results  Component Value Date   NA 134 (L) 10/03/2019   NA 133 (L) 08/03/2013   K 3.6 10/03/2019   K 4.7 08/03/2013   CL 92 (L) 10/03/2019   CL 101 08/03/2013   CO2 34 (H) 10/03/2019   CO2 27 08/03/2013   BUN 30 (H) 10/03/2019   BUN 16 08/03/2013   CREATININE 1.70 (H) 10/17/2019   CREATININE 1.13 08/03/2013   PROT 6.1 (L) 09/19/2019   PROT 6.6 07/31/2013   ALBUMIN 3.2 (L) 09/19/2019   ALBUMIN 2.6 (L) 07/31/2013   BILITOT 2.1 (H) 09/19/2019   BILITOT 1.2 (H) 07/31/2013   ALKPHOS 105 09/19/2019   ALKPHOS 86 07/31/2013   AST 27 09/19/2019   AST 20 07/31/2013   ALT 33 09/19/2019   ALT 27 07/31/2013  .   Total Discharge time is about 33 minutes  Benito Mccreedy M.D on 11/12/2019 at 7:42 PM  [facility]   Office  [phone]  Dragon dictation system was used to create this note, attempts have been made to correct errors, however presence of uncorrected errors is not a reflection quality of care provided

## 2021-04-22 IMAGING — CT CT CHEST W/ CM
2 of 5 series · 12 of 36 positions shown, 15 images · IV contrast (omnipaque)
Comparison: CT abdomen pelvis, 09/21/2018

CLINICAL DATA: Follow-up colon cancer, status post right
hemicolectomy

EXAM:
CT CHEST, ABDOMEN, AND PELVIS WITH CONTRAST
TECHNIQUE: Multidetector CT imaging of the chest, abdomen and pelvis was
performed following the standard protocol during bolus
administration of intravenous contrast.
CONTRAST:  75mL OMNIPAQUE IOHEXOL 300 MG/ML SOLN, additional oral
enteric contrast

[Series 2: cap with · axial · 0.83mm/px · z∈[-427,+153]mm · 9 of 146 slices shown, 12 images]
[im 15/146  mediastinal]
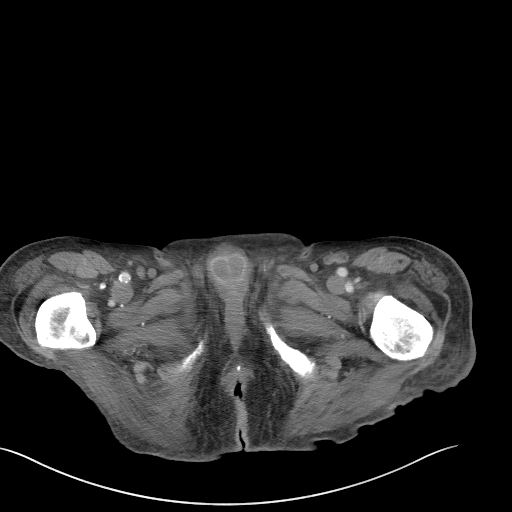
[im 15/146  lung]
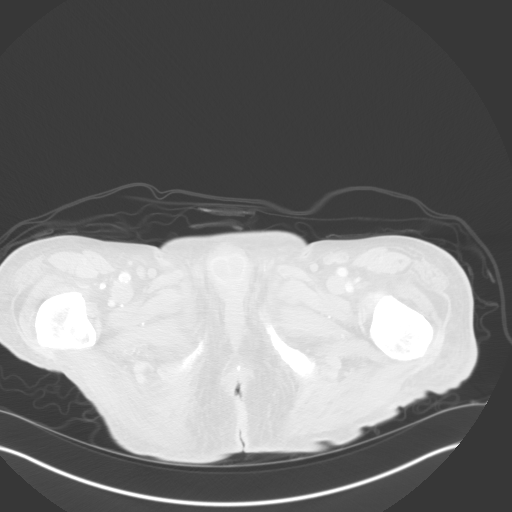
[im 30/146  lung]
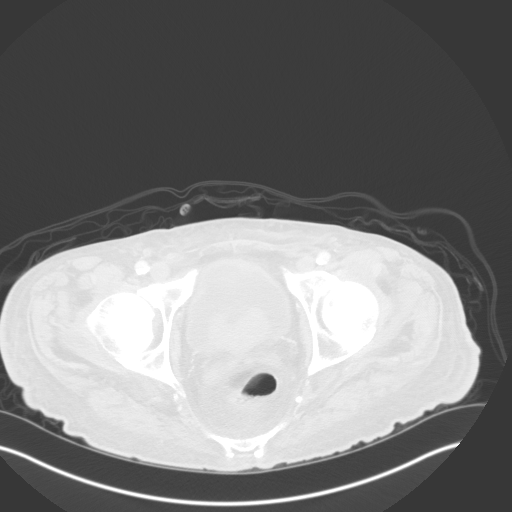
[im 44/146  lung]
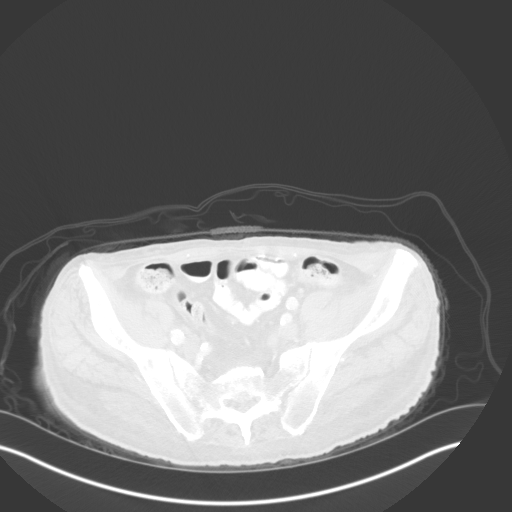
[im 59/146  lung]
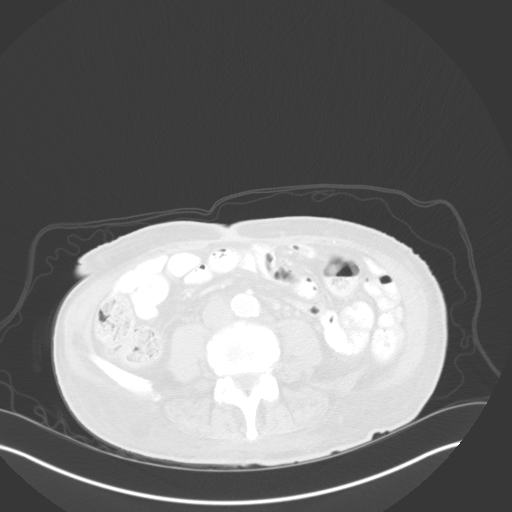
[im 73/146  mediastinal]
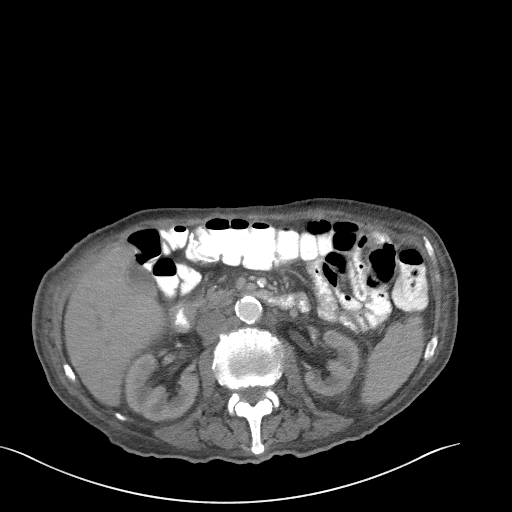
[im 73/146  lung]
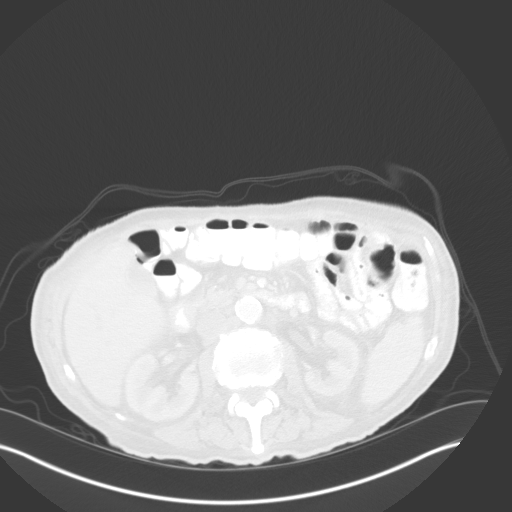
[im 88/146  lung]
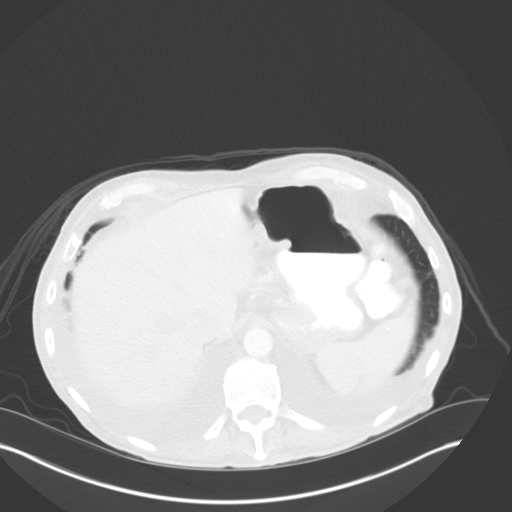
[im 102/146  lung]
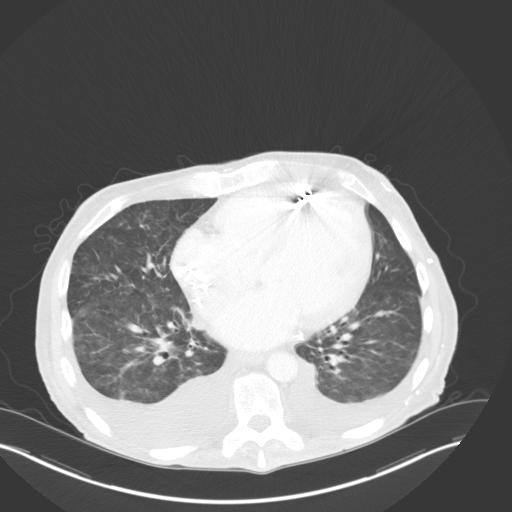
[im 117/146  lung]
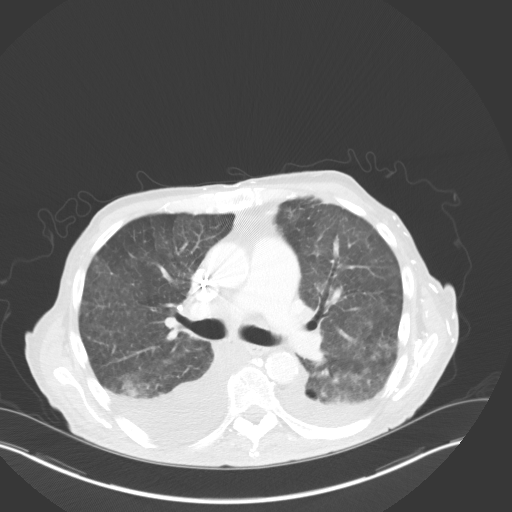
[im 131/146  mediastinal]
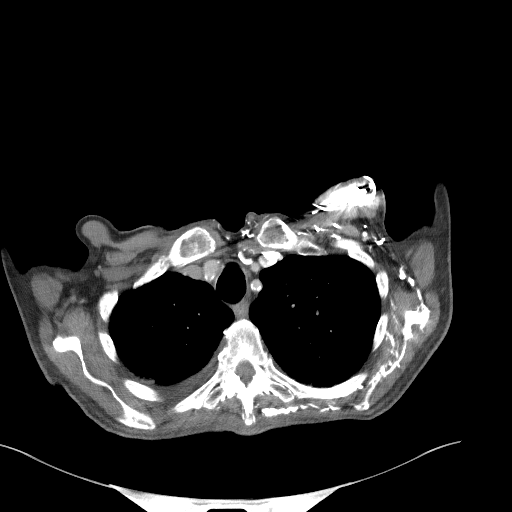
[im 131/146  lung]
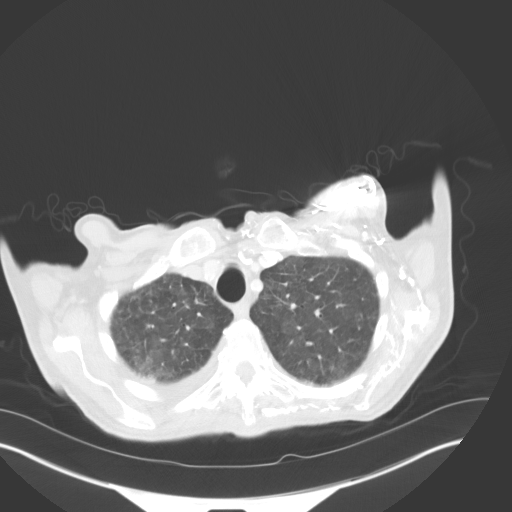

[Series 5: coronals · coronal · 0.84mm/px · 3 of 147 slices shown]
[im 30/147  lung]
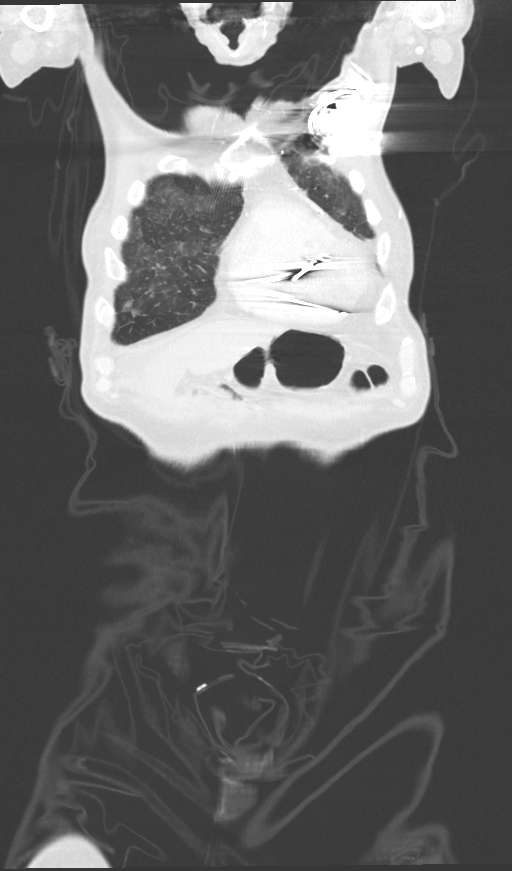
[im 59/147  lung]
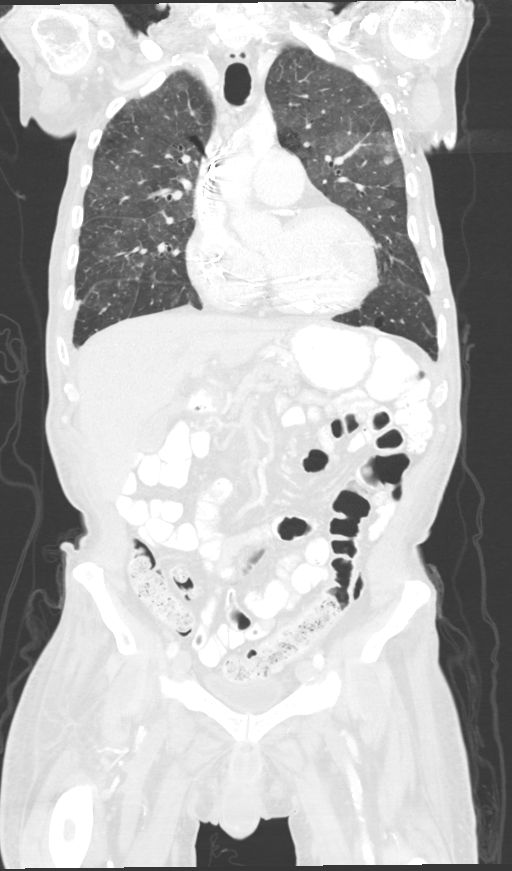
[im 88/147  lung]
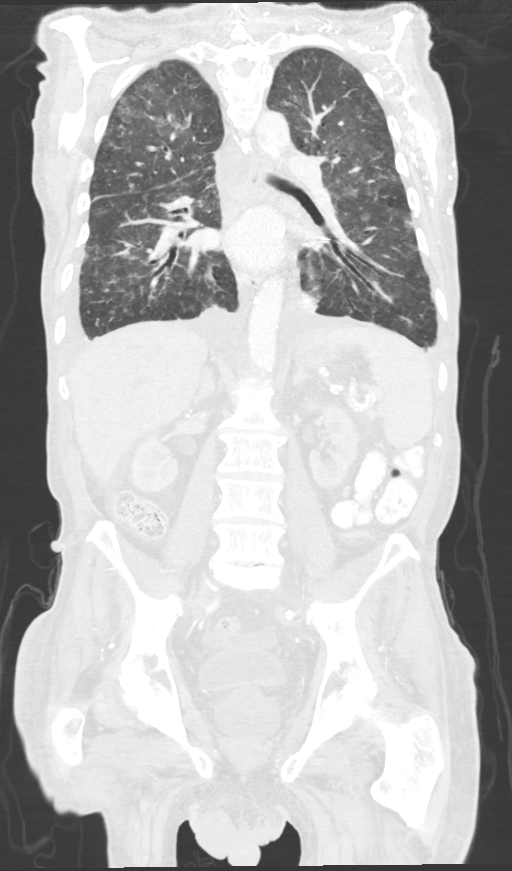

[12 of 36 positions shown; findings below may reference images not displayed]

FINDINGS: CT CHEST FINDINGS

Cardiovascular: Aortic atherosclerosis. Cardiomegaly. Three-vessel
coronary artery calcifications and/or stents. Enlargement of the
main pulmonary artery up to 4.0 cm. Left chest multi lead pacer
defibrillator. No pericardial effusion.

Mediastinum/Nodes: No enlarged mediastinal, hilar, or axillary lymph
nodes. Thyroid gland, trachea, and esophagus demonstrate no
significant findings.

Lungs/Pleura: Moderate right, small left pleural effusions and
associated atelectasis or consolidation, increased compared to prior
examination. There is very extensive, diffuse centrilobular and
ground-glass nodularity throughout the lungs, which is significantly
increased compared to the included lung bases on prior examination.
There is a more focal, although somewhat ill-defined 8 mm pulmonary
nodule of the peripheral left upper lobe (series 2, image 28).

Musculoskeletal: No chest wall mass or suspicious bone lesions
identified.

CT ABDOMEN PELVIS FINDINGS

Hepatobiliary: No solid liver abnormality is seen. No gallstones,
gallbladder wall thickening, or biliary dilatation.

Pancreas: Unremarkable. No pancreatic ductal dilatation or
surrounding inflammatory changes.

Spleen: Punctuate calcifications of the splenic parenchyma.

Adrenals/Urinary Tract: Adrenal glands are unremarkable. Kidneys are
normal, without renal calculi, solid lesion, or hydronephrosis.
Bladder is unremarkable.

Stomach/Bowel: Stomach is within normal limits. Redemonstrated
postoperative findings of right hemicolectomy and reanastomosis.

Vascular/Lymphatic: Aortic atherosclerosis. No enlarged abdominal or
pelvic lymph nodes.

Reproductive: Prostatomegaly.

Other: No abdominal wall hernia or abnormality. Small volume
perihepatic and pelvic ascites.

Musculoskeletal: No acute or significant osseous findings.
IMPRESSION: 1. Redemonstrated postoperative findings of right hemicolectomy and
reanastomosis. There is no evidence of malignant recurrence in the
chest, abdomen, or pelvis.
2. There is very extensive, diffuse centrilobular and ground-glass
nodularity throughout the lungs, which is significantly increased
compared to the included lung bases on prior examination. Findings
favor atypical infection or perhaps an unusual appearance of edema.
Pulmonary metastatic disease not favored.
3. There is a more focal, although somewhat ill-defined 8 mm
pulmonary nodule of the peripheral left upper lobe, nonspecific.
Metastatic disease is not strictly excluded although less favored.
Attention on follow-up.
4. Moderate right, small left pleural effusions and associated
atelectasis or consolidation, increased compared to prior
examination.
5. Cardiomegaly. Three-vessel coronary artery calcifications and/or
stents.
6. Enlargement of the main pulmonary artery up to 4.0 cm, which can
be seen with pulmonaryhypertension.
7. Small volume perihepatic and pelvic ascites.
8. Aortic Atherosclerosis ([id number]).
